# Patient Record
Sex: Female | Born: 1935 | Race: White | Hispanic: No | Smoking: Never smoker
Health system: Southern US, Community
[De-identification: ages and names within clinical notes are randomized; demographics above are authoritative.]

## PROBLEM LIST (undated history)

## (undated) DIAGNOSIS — E785 Hyperlipidemia, unspecified: Secondary | ICD-10-CM

## (undated) DIAGNOSIS — M4326 Fusion of spine, lumbar region: Secondary | ICD-10-CM

## (undated) DIAGNOSIS — K5730 Diverticulosis of large intestine without perforation or abscess without bleeding: Secondary | ICD-10-CM

## (undated) DIAGNOSIS — R079 Chest pain, unspecified: Secondary | ICD-10-CM

## (undated) DIAGNOSIS — R200 Anesthesia of skin: Secondary | ICD-10-CM

## (undated) DIAGNOSIS — F32A Depression, unspecified: Secondary | ICD-10-CM

## (undated) DIAGNOSIS — G8929 Other chronic pain: Secondary | ICD-10-CM

## (undated) DIAGNOSIS — F329 Major depressive disorder, single episode, unspecified: Secondary | ICD-10-CM

## (undated) DIAGNOSIS — M549 Dorsalgia, unspecified: Secondary | ICD-10-CM

## (undated) DIAGNOSIS — H269 Unspecified cataract: Secondary | ICD-10-CM

## (undated) DIAGNOSIS — K5900 Constipation, unspecified: Secondary | ICD-10-CM

## (undated) DIAGNOSIS — K219 Gastro-esophageal reflux disease without esophagitis: Secondary | ICD-10-CM

## (undated) DIAGNOSIS — M419 Scoliosis, unspecified: Secondary | ICD-10-CM

## (undated) DIAGNOSIS — Z8719 Personal history of other diseases of the digestive system: Secondary | ICD-10-CM

## (undated) DIAGNOSIS — M4716 Other spondylosis with myelopathy, lumbar region: Secondary | ICD-10-CM

## (undated) DIAGNOSIS — R42 Dizziness and giddiness: Secondary | ICD-10-CM

## (undated) DIAGNOSIS — M1990 Unspecified osteoarthritis, unspecified site: Secondary | ICD-10-CM

## (undated) DIAGNOSIS — M542 Cervicalgia: Secondary | ICD-10-CM

## (undated) DIAGNOSIS — M8580 Other specified disorders of bone density and structure, unspecified site: Secondary | ICD-10-CM

## (undated) DIAGNOSIS — I10 Essential (primary) hypertension: Secondary | ICD-10-CM

## (undated) DIAGNOSIS — C4491 Basal cell carcinoma of skin, unspecified: Secondary | ICD-10-CM

## (undated) HISTORY — PX: COLON SURGERY: SHX602

## (undated) HISTORY — PX: TUBAL LIGATION: SHX77

## (undated) HISTORY — PX: EYE SURGERY: SHX253

## (undated) HISTORY — PX: SPINE SURGERY: SHX786

## (undated) HISTORY — DX: Diverticulosis of large intestine without perforation or abscess without bleeding: K57.30

## (undated) HISTORY — DX: Chest pain, unspecified: R07.9

## (undated) HISTORY — DX: Cervicalgia: M54.2

## (undated) HISTORY — DX: Unspecified cataract: H26.9

## (undated) HISTORY — DX: Personal history of other diseases of the digestive system: Z87.19

## (undated) HISTORY — DX: Other specified disorders of bone density and structure, unspecified site: M85.80

## (undated) HISTORY — DX: Fusion of spine, lumbar region: M43.26

## (undated) HISTORY — DX: Unspecified osteoarthritis, unspecified site: M19.90

## (undated) HISTORY — DX: Gastro-esophageal reflux disease without esophagitis: K21.9

## (undated) HISTORY — DX: Anesthesia of skin: R20.0

## (undated) HISTORY — DX: Essential (primary) hypertension: I10

## (undated) HISTORY — PX: APPENDECTOMY: SHX54

## (undated) HISTORY — PX: NECK SURGERY: SHX720

## (undated) HISTORY — DX: Hyperlipidemia, unspecified: E78.5

## (undated) HISTORY — PX: CHOLECYSTECTOMY: SHX55

## (undated) HISTORY — DX: Basal cell carcinoma of skin, unspecified: C44.91

## (undated) HISTORY — PX: BACK SURGERY: SHX140

## (undated) HISTORY — DX: Other spondylosis with myelopathy, lumbar region: M47.16

## (undated) HISTORY — PX: COLONOSCOPY: SHX174

## (undated) HISTORY — PX: KNEE SURGERY: SHX244

## (undated) HISTORY — DX: Scoliosis, unspecified: M41.9

---

## 1968-09-28 HISTORY — PX: CHOLECYSTECTOMY: SHX55

## 1998-11-27 ENCOUNTER — Encounter: Payer: Self-pay | Admitting: Family Medicine

## 1998-11-27 LAB — CONVERTED CEMR LAB: Pap Smear: NORMAL

## 1998-12-06 ENCOUNTER — Other Ambulatory Visit: Admission: RE | Admit: 1998-12-06 | Discharge: 1998-12-06 | Payer: Self-pay | Admitting: Family Medicine

## 1999-05-30 LAB — HM COLONOSCOPY

## 1999-07-25 ENCOUNTER — Inpatient Hospital Stay (HOSPITAL_COMMUNITY): Admission: AD | Admit: 1999-07-25 | Discharge: 1999-08-01 | Payer: Self-pay | Admitting: Family Medicine

## 1999-07-25 ENCOUNTER — Encounter: Payer: Self-pay | Admitting: Gastroenterology

## 1999-08-20 ENCOUNTER — Encounter: Payer: Self-pay | Admitting: Gastroenterology

## 1999-08-20 ENCOUNTER — Ambulatory Visit (HOSPITAL_COMMUNITY): Admission: RE | Admit: 1999-08-20 | Discharge: 1999-08-20 | Payer: Self-pay | Admitting: Gastroenterology

## 1999-09-27 ENCOUNTER — Emergency Department (HOSPITAL_COMMUNITY): Admission: EM | Admit: 1999-09-27 | Discharge: 1999-09-27 | Payer: Self-pay | Admitting: Emergency Medicine

## 1999-09-27 ENCOUNTER — Encounter: Payer: Self-pay | Admitting: Emergency Medicine

## 1999-09-29 HISTORY — PX: PARTIAL COLECTOMY: SHX5273

## 1999-10-06 ENCOUNTER — Inpatient Hospital Stay (HOSPITAL_COMMUNITY): Admission: RE | Admit: 1999-10-06 | Discharge: 1999-10-13 | Payer: Self-pay | Admitting: *Deleted

## 2000-02-15 ENCOUNTER — Encounter: Payer: Self-pay | Admitting: Emergency Medicine

## 2000-02-16 ENCOUNTER — Inpatient Hospital Stay (HOSPITAL_COMMUNITY): Admission: EM | Admit: 2000-02-16 | Discharge: 2000-02-21 | Payer: Self-pay | Admitting: Internal Medicine

## 2001-09-06 ENCOUNTER — Encounter: Payer: Self-pay | Admitting: Gastroenterology

## 2001-09-06 ENCOUNTER — Encounter: Admission: RE | Admit: 2001-09-06 | Discharge: 2001-09-06 | Payer: Self-pay | Admitting: Gastroenterology

## 2003-10-31 ENCOUNTER — Encounter: Admission: RE | Admit: 2003-10-31 | Discharge: 2003-10-31 | Payer: Self-pay | Admitting: Orthopedic Surgery

## 2003-11-22 ENCOUNTER — Inpatient Hospital Stay (HOSPITAL_COMMUNITY): Admission: RE | Admit: 2003-11-22 | Discharge: 2003-11-24 | Payer: Self-pay | Admitting: Orthopaedic Surgery

## 2004-07-11 ENCOUNTER — Emergency Department (HOSPITAL_COMMUNITY): Admission: AD | Admit: 2004-07-11 | Discharge: 2004-07-11 | Payer: Self-pay | Admitting: Family Medicine

## 2004-07-11 ENCOUNTER — Ambulatory Visit (HOSPITAL_COMMUNITY): Admission: RE | Admit: 2004-07-11 | Discharge: 2004-07-11 | Payer: Self-pay | Admitting: Family Medicine

## 2004-08-11 ENCOUNTER — Ambulatory Visit: Payer: Self-pay | Admitting: Family Medicine

## 2004-08-12 ENCOUNTER — Ambulatory Visit: Payer: Self-pay | Admitting: Family Medicine

## 2004-08-12 ENCOUNTER — Encounter: Admission: RE | Admit: 2004-08-12 | Discharge: 2004-08-12 | Payer: Self-pay | Admitting: Family Medicine

## 2004-09-04 ENCOUNTER — Ambulatory Visit: Payer: Self-pay | Admitting: Family Medicine

## 2004-11-03 ENCOUNTER — Ambulatory Visit: Payer: Self-pay | Admitting: Family Medicine

## 2005-01-06 ENCOUNTER — Ambulatory Visit: Payer: Self-pay | Admitting: Family Medicine

## 2005-02-13 ENCOUNTER — Ambulatory Visit: Payer: Self-pay | Admitting: Family Medicine

## 2005-03-04 ENCOUNTER — Ambulatory Visit: Payer: Self-pay | Admitting: Family Medicine

## 2005-03-30 ENCOUNTER — Ambulatory Visit: Payer: Self-pay | Admitting: Cardiology

## 2005-03-30 ENCOUNTER — Ambulatory Visit: Payer: Self-pay | Admitting: Family Medicine

## 2005-06-24 ENCOUNTER — Ambulatory Visit: Payer: Self-pay | Admitting: Family Medicine

## 2005-07-24 ENCOUNTER — Ambulatory Visit: Payer: Self-pay | Admitting: Family Medicine

## 2005-09-09 ENCOUNTER — Ambulatory Visit: Payer: Self-pay | Admitting: Family Medicine

## 2005-12-14 ENCOUNTER — Ambulatory Visit: Payer: Self-pay | Admitting: Family Medicine

## 2005-12-16 ENCOUNTER — Ambulatory Visit: Payer: Self-pay | Admitting: Family Medicine

## 2005-12-30 ENCOUNTER — Ambulatory Visit: Payer: Self-pay | Admitting: Family Medicine

## 2006-02-05 ENCOUNTER — Ambulatory Visit: Payer: Self-pay | Admitting: Family Medicine

## 2006-03-18 ENCOUNTER — Ambulatory Visit: Payer: Self-pay | Admitting: Family Medicine

## 2006-04-28 HISTORY — PX: CATARACT EXTRACTION: SUR2

## 2006-05-25 ENCOUNTER — Ambulatory Visit: Payer: Self-pay | Admitting: Family Medicine

## 2006-06-24 ENCOUNTER — Ambulatory Visit: Payer: Self-pay | Admitting: Family Medicine

## 2006-07-14 ENCOUNTER — Ambulatory Visit: Payer: Self-pay | Admitting: Family Medicine

## 2006-08-13 ENCOUNTER — Ambulatory Visit: Payer: Self-pay | Admitting: Family Medicine

## 2006-08-30 ENCOUNTER — Ambulatory Visit: Payer: Self-pay | Admitting: Family Medicine

## 2006-09-14 ENCOUNTER — Ambulatory Visit: Payer: Self-pay | Admitting: Family Medicine

## 2006-11-16 ENCOUNTER — Ambulatory Visit: Payer: Self-pay | Admitting: Family Medicine

## 2006-11-16 LAB — CONVERTED CEMR LAB
ALT: 21 units/L (ref 0–40)
AST: 24 units/L (ref 0–37)
Albumin: 3.8 g/dL (ref 3.5–5.2)
BUN: 19 mg/dL (ref 6–23)
CO2: 31 meq/L (ref 19–32)
Chloride: 100 meq/L (ref 96–112)
Creatinine, Ser: 1.3 mg/dL — ABNORMAL HIGH (ref 0.4–1.2)
Direct LDL: 163 mg/dL
GFR calc non Af Amer: 42 mL/min
Phosphorus: 2.9 mg/dL (ref 2.3–4.6)
Triglycerides: 141 mg/dL (ref 0–149)

## 2007-02-04 ENCOUNTER — Encounter: Payer: Self-pay | Admitting: Family Medicine

## 2007-02-04 DIAGNOSIS — K5730 Diverticulosis of large intestine without perforation or abscess without bleeding: Secondary | ICD-10-CM | POA: Insufficient documentation

## 2007-02-04 DIAGNOSIS — IMO0002 Reserved for concepts with insufficient information to code with codable children: Secondary | ICD-10-CM | POA: Insufficient documentation

## 2007-02-04 DIAGNOSIS — R252 Cramp and spasm: Secondary | ICD-10-CM | POA: Insufficient documentation

## 2007-02-04 DIAGNOSIS — E785 Hyperlipidemia, unspecified: Secondary | ICD-10-CM | POA: Insufficient documentation

## 2007-02-04 DIAGNOSIS — I10 Essential (primary) hypertension: Secondary | ICD-10-CM

## 2007-02-04 DIAGNOSIS — M109 Gout, unspecified: Secondary | ICD-10-CM

## 2007-02-04 DIAGNOSIS — M1990 Unspecified osteoarthritis, unspecified site: Secondary | ICD-10-CM | POA: Insufficient documentation

## 2007-02-04 DIAGNOSIS — N3080 Other cystitis without hematuria: Secondary | ICD-10-CM

## 2007-02-04 DIAGNOSIS — N289 Disorder of kidney and ureter, unspecified: Secondary | ICD-10-CM

## 2007-02-04 DIAGNOSIS — Z8719 Personal history of other diseases of the digestive system: Secondary | ICD-10-CM

## 2007-02-04 DIAGNOSIS — N1832 Chronic kidney disease, stage 3b: Secondary | ICD-10-CM | POA: Insufficient documentation

## 2007-02-04 DIAGNOSIS — N3000 Acute cystitis without hematuria: Secondary | ICD-10-CM | POA: Insufficient documentation

## 2007-02-14 ENCOUNTER — Ambulatory Visit: Payer: Self-pay | Admitting: Family Medicine

## 2007-02-14 DIAGNOSIS — F418 Other specified anxiety disorders: Secondary | ICD-10-CM | POA: Insufficient documentation

## 2007-02-16 LAB — CONVERTED CEMR LAB
AST: 23 units/L (ref 0–37)
Cholesterol: 314 mg/dL (ref 0–200)
Creatinine, Ser: 1.3 mg/dL — ABNORMAL HIGH (ref 0.4–1.2)
Direct LDL: 211.4 mg/dL
Total CHOL/HDL Ratio: 6.2

## 2007-02-22 ENCOUNTER — Ambulatory Visit: Payer: Self-pay | Admitting: Family Medicine

## 2007-02-22 LAB — CONVERTED CEMR LAB
AST: 29 units/L (ref 0–37)
Albumin: 4 g/dL (ref 3.5–5.2)
Alkaline Phosphatase: 51 units/L (ref 39–117)
Basophils Absolute: 0 10*3/uL (ref 0.0–0.1)
Basophils Relative: 0.1 % (ref 0.0–1.0)
CO2: 30 meq/L (ref 19–32)
Chloride: 101 meq/L (ref 96–112)
Creatinine, Ser: 1.6 mg/dL — ABNORMAL HIGH (ref 0.4–1.2)
HCT: 46.3 % — ABNORMAL HIGH (ref 36.0–46.0)
Hemoglobin: 15.9 g/dL — ABNORMAL HIGH (ref 12.0–15.0)
Lipase: 17 units/L (ref 11.0–59.0)
MCHC: 34.3 g/dL (ref 30.0–36.0)
Monocytes Absolute: 0.9 10*3/uL — ABNORMAL HIGH (ref 0.2–0.7)
Neutrophils Relative %: 34.4 % — ABNORMAL LOW (ref 43.0–77.0)
Potassium: 3.5 meq/L (ref 3.5–5.1)
RBC: 5.34 M/uL — ABNORMAL HIGH (ref 3.87–5.11)
RDW: 13.2 % (ref 11.5–14.6)
Sodium: 141 meq/L (ref 135–145)
Total Bilirubin: 0.8 mg/dL (ref 0.3–1.2)
Total Protein: 7.3 g/dL (ref 6.0–8.3)
WBC: 10.7 10*3/uL — ABNORMAL HIGH (ref 4.5–10.5)

## 2007-02-23 ENCOUNTER — Ambulatory Visit: Payer: Self-pay | Admitting: Family Medicine

## 2007-02-24 LAB — CONVERTED CEMR LAB
Albumin: 4.1 g/dL (ref 3.5–5.2)
BUN: 19 mg/dL (ref 6–23)
Calcium: 9 mg/dL (ref 8.4–10.5)
Eosinophils Absolute: 0.1 10*3/uL (ref 0.0–0.6)
GFR calc Af Amer: 43 mL/min
GFR calc non Af Amer: 36 mL/min
Lymphocytes Relative: 53.7 % — ABNORMAL HIGH (ref 12.0–46.0)
MCV: 87.3 fL (ref 78.0–100.0)
Monocytes Relative: 8.2 % (ref 3.0–11.0)
Neutro Abs: 3.9 10*3/uL (ref 1.4–7.7)
Phosphorus: 2 mg/dL — ABNORMAL LOW (ref 2.3–4.6)
Platelets: 349 10*3/uL (ref 150–400)
Potassium: 3.7 meq/L (ref 3.5–5.1)
RBC: 5.43 M/uL — ABNORMAL HIGH (ref 3.87–5.11)

## 2007-02-25 ENCOUNTER — Ambulatory Visit: Payer: Self-pay | Admitting: Family Medicine

## 2007-03-02 ENCOUNTER — Ambulatory Visit: Payer: Self-pay | Admitting: Family Medicine

## 2007-03-02 LAB — CONVERTED CEMR LAB: BUN: 11 mg/dL (ref 6–23)

## 2007-03-11 ENCOUNTER — Telehealth (INDEPENDENT_AMBULATORY_CARE_PROVIDER_SITE_OTHER): Payer: Self-pay | Admitting: *Deleted

## 2007-03-16 ENCOUNTER — Ambulatory Visit: Payer: Self-pay | Admitting: Family Medicine

## 2007-03-18 LAB — CONVERTED CEMR LAB
ALT: 20 units/L (ref 0–40)
AST: 23 units/L (ref 0–37)
Albumin: 3.7 g/dL (ref 3.5–5.2)
BUN: 13 mg/dL (ref 6–23)
Calcium: 9.3 mg/dL (ref 8.4–10.5)
GFR calc Af Amer: 51 mL/min
GFR calc non Af Amer: 42 mL/min
Glucose, Bld: 93 mg/dL (ref 70–99)
Potassium: 3.5 meq/L (ref 3.5–5.1)
Total CHOL/HDL Ratio: 7.1
VLDL: 37 mg/dL (ref 0–40)

## 2007-07-08 ENCOUNTER — Ambulatory Visit: Payer: Self-pay | Admitting: Family Medicine

## 2007-07-12 ENCOUNTER — Ambulatory Visit: Payer: Self-pay | Admitting: Family Medicine

## 2007-07-18 LAB — CONVERTED CEMR LAB
Albumin: 3.6 g/dL (ref 3.5–5.2)
Chloride: 102 meq/L (ref 96–112)
Direct LDL: 215.9 mg/dL
GFR calc Af Amer: 56 mL/min
GFR calc non Af Amer: 46 mL/min
Phosphorus: 3.1 mg/dL (ref 2.3–4.6)
Sodium: 140 meq/L (ref 135–145)
Triglycerides: 149 mg/dL (ref 0–149)
VLDL: 30 mg/dL (ref 0–40)

## 2007-08-01 ENCOUNTER — Ambulatory Visit: Payer: Self-pay | Admitting: Family Medicine

## 2007-08-01 LAB — CONVERTED CEMR LAB
Glucose, Urine, Semiquant: NEGATIVE
Ketones, urine, test strip: NEGATIVE
Protein, U semiquant: NEGATIVE

## 2007-08-30 ENCOUNTER — Ambulatory Visit: Payer: Self-pay | Admitting: Family Medicine

## 2007-09-05 LAB — CONVERTED CEMR LAB
Cholesterol: 248 mg/dL (ref 0–200)
HDL: 56.5 mg/dL (ref >39.0)
Total CHOL/HDL Ratio: 4.4
Triglycerides: 162 mg/dL — ABNORMAL HIGH (ref 0–149)
VLDL: 32 mg/dL (ref 0–40)

## 2007-09-13 ENCOUNTER — Ambulatory Visit: Payer: Self-pay | Admitting: Family Medicine

## 2007-09-13 LAB — CONVERTED CEMR LAB
Nitrite: NEGATIVE
Protein, U semiquant: NEGATIVE

## 2007-09-14 ENCOUNTER — Encounter: Payer: Self-pay | Admitting: Family Medicine

## 2007-10-21 ENCOUNTER — Ambulatory Visit: Payer: Self-pay | Admitting: Family Medicine

## 2007-10-26 ENCOUNTER — Encounter: Payer: Self-pay | Admitting: Family Medicine

## 2007-10-31 ENCOUNTER — Encounter: Payer: Self-pay | Admitting: Family Medicine

## 2007-10-31 ENCOUNTER — Encounter (INDEPENDENT_AMBULATORY_CARE_PROVIDER_SITE_OTHER): Payer: Self-pay | Admitting: *Deleted

## 2007-11-01 ENCOUNTER — Ambulatory Visit: Payer: Self-pay | Admitting: Family Medicine

## 2007-11-01 DIAGNOSIS — K5900 Constipation, unspecified: Secondary | ICD-10-CM | POA: Insufficient documentation

## 2007-11-01 LAB — CONVERTED CEMR LAB
Ketones, urine, test strip: NEGATIVE
Nitrite: NEGATIVE
Urobilinogen, UA: 0.2

## 2007-11-02 ENCOUNTER — Encounter: Payer: Self-pay | Admitting: Family Medicine

## 2007-11-17 ENCOUNTER — Telehealth: Payer: Self-pay | Admitting: Family Medicine

## 2008-01-27 ENCOUNTER — Ambulatory Visit: Payer: Self-pay | Admitting: Internal Medicine

## 2008-01-27 LAB — CONVERTED CEMR LAB
Glucose, Urine, Semiquant: NEGATIVE
Ketones, urine, test strip: NEGATIVE
Nitrite: NEGATIVE
pH: 8.5

## 2008-07-09 ENCOUNTER — Ambulatory Visit: Payer: Self-pay | Admitting: Family Medicine

## 2008-07-16 ENCOUNTER — Ambulatory Visit: Payer: Self-pay | Admitting: Family Medicine

## 2008-07-17 LAB — CONVERTED CEMR LAB
Cholesterol: 297 mg/dL (ref 0–200)
Direct LDL: 185.8 mg/dL
Total CHOL/HDL Ratio: 6

## 2008-07-26 ENCOUNTER — Telehealth: Payer: Self-pay | Admitting: Family Medicine

## 2008-08-08 ENCOUNTER — Ambulatory Visit: Payer: Self-pay | Admitting: Family Medicine

## 2008-08-08 LAB — CONVERTED CEMR LAB
Bilirubin Urine: NEGATIVE
Glucose, Urine, Semiquant: NEGATIVE
Ketones, urine, test strip: NEGATIVE
Specific Gravity, Urine: <1.005
Urobilinogen, UA: 0.2
pH: 7

## 2008-08-13 ENCOUNTER — Telehealth: Payer: Self-pay | Admitting: Family Medicine

## 2008-08-13 ENCOUNTER — Ambulatory Visit: Payer: Self-pay | Admitting: Family Medicine

## 2008-08-13 LAB — CONVERTED CEMR LAB
Nitrite: NEGATIVE
Specific Gravity, Urine: <1.005
Urobilinogen, UA: 0.2
WBC Urine, dipstick: NEGATIVE

## 2008-08-14 ENCOUNTER — Encounter: Payer: Self-pay | Admitting: Family Medicine

## 2008-08-22 ENCOUNTER — Encounter: Payer: Self-pay | Admitting: Family Medicine

## 2008-08-30 ENCOUNTER — Ambulatory Visit: Payer: Self-pay | Admitting: Family Medicine

## 2008-09-05 LAB — CONVERTED CEMR LAB
ALT: 16 units/L (ref 0–35)
Cholesterol: 210 mg/dL (ref 0–200)
Direct LDL: 143.2 mg/dL

## 2008-09-25 ENCOUNTER — Telehealth: Payer: Self-pay | Admitting: Family Medicine

## 2008-11-12 ENCOUNTER — Telehealth (INDEPENDENT_AMBULATORY_CARE_PROVIDER_SITE_OTHER): Payer: Self-pay | Admitting: *Deleted

## 2008-12-17 ENCOUNTER — Encounter: Payer: Self-pay | Admitting: Family Medicine

## 2008-12-24 ENCOUNTER — Encounter (INDEPENDENT_AMBULATORY_CARE_PROVIDER_SITE_OTHER): Payer: Self-pay | Admitting: *Deleted

## 2009-01-28 ENCOUNTER — Ambulatory Visit: Payer: Self-pay | Admitting: Family Medicine

## 2009-01-29 LAB — CONVERTED CEMR LAB
AST: 25 units/L (ref 0–37)
Albumin: 3.9 g/dL (ref 3.5–5.2)
Alkaline Phosphatase: 58 units/L (ref 39–117)
BUN: 24 mg/dL — ABNORMAL HIGH (ref 6–23)
Basophils Relative: 0.2 % (ref 0.0–3.0)
Calcium: 9.5 mg/dL (ref 8.4–10.5)
Cholesterol: 234 mg/dL — ABNORMAL HIGH (ref 0–200)
Direct LDL: 157.8 mg/dL
Eosinophils Relative: 0.8 % (ref 0.0–5.0)
Glucose, Bld: 102 mg/dL — ABNORMAL HIGH (ref 70–99)
HCT: 42.2 % (ref 36.0–46.0)
HDL: 53.4 mg/dL (ref >39.00)
MCV: 90.7 fL (ref 78.0–100.0)
Monocytes Absolute: 0.7 10*3/uL (ref 0.1–1.0)
Monocytes Relative: 7.3 % (ref 3.0–12.0)
Neutrophils Relative %: 33.6 % — ABNORMAL LOW (ref 43.0–77.0)
Phosphorus: 3.6 mg/dL (ref 2.3–4.6)
Platelets: 263 10*3/uL (ref 150.0–400.0)
RBC: 4.65 M/uL (ref 3.87–5.11)
TSH: 2.23 microintl units/mL (ref 0.35–5.50)
Total Protein: 7.1 g/dL (ref 6.0–8.3)
VLDL: 28 mg/dL (ref 0.0–40.0)
WBC: 9.9 10*3/uL (ref 4.5–10.5)

## 2009-01-30 ENCOUNTER — Ambulatory Visit: Payer: Self-pay | Admitting: Family Medicine

## 2009-01-30 DIAGNOSIS — M949 Disorder of cartilage, unspecified: Secondary | ICD-10-CM

## 2009-01-30 DIAGNOSIS — M899 Disorder of bone, unspecified: Secondary | ICD-10-CM

## 2009-02-19 ENCOUNTER — Encounter: Payer: Self-pay | Admitting: Family Medicine

## 2009-02-27 ENCOUNTER — Encounter (INDEPENDENT_AMBULATORY_CARE_PROVIDER_SITE_OTHER): Payer: Self-pay | Admitting: *Deleted

## 2009-03-09 ENCOUNTER — Emergency Department (HOSPITAL_COMMUNITY): Admission: EM | Admit: 2009-03-09 | Discharge: 2009-03-09 | Payer: Self-pay | Admitting: Emergency Medicine

## 2009-03-14 ENCOUNTER — Encounter: Payer: Self-pay | Admitting: Family Medicine

## 2009-03-14 ENCOUNTER — Ambulatory Visit: Payer: Self-pay | Admitting: Family Medicine

## 2009-03-15 LAB — CONVERTED CEMR LAB
Albumin: 4.2 g/dL (ref 3.5–5.2)
BUN: 26 mg/dL — ABNORMAL HIGH (ref 6–23)
CO2: 31 meq/L (ref 19–32)
Calcium: 9.4 mg/dL (ref 8.4–10.5)
Chloride: 99 meq/L (ref 96–112)
Creatinine, Ser: 1.3 mg/dL — ABNORMAL HIGH (ref 0.4–1.2)
Direct LDL: 135.8 mg/dL
HDL: 52.8 mg/dL (ref >39.00)
Triglycerides: 146 mg/dL (ref 0.0–149.0)
VLDL: 29.2 mg/dL (ref 0.0–40.0)

## 2009-03-18 ENCOUNTER — Telehealth (INDEPENDENT_AMBULATORY_CARE_PROVIDER_SITE_OTHER): Payer: Self-pay | Admitting: *Deleted

## 2009-03-19 ENCOUNTER — Encounter: Payer: Self-pay | Admitting: Family Medicine

## 2009-03-19 ENCOUNTER — Ambulatory Visit: Payer: Self-pay

## 2009-03-26 ENCOUNTER — Ambulatory Visit: Payer: Self-pay | Admitting: Family Medicine

## 2009-04-24 ENCOUNTER — Ambulatory Visit: Payer: Self-pay | Admitting: Family Medicine

## 2009-04-24 LAB — CONVERTED CEMR LAB: Yeast, UA: 0

## 2009-04-25 ENCOUNTER — Encounter: Payer: Self-pay | Admitting: Family Medicine

## 2009-06-01 ENCOUNTER — Emergency Department (HOSPITAL_COMMUNITY): Admission: EM | Admit: 2009-06-01 | Discharge: 2009-06-01 | Payer: Self-pay | Admitting: Emergency Medicine

## 2009-06-03 ENCOUNTER — Telehealth: Payer: Self-pay | Admitting: Family Medicine

## 2009-06-04 ENCOUNTER — Ambulatory Visit: Payer: Self-pay | Admitting: Family Medicine

## 2009-06-04 LAB — CONVERTED CEMR LAB
Casts: 0 /lpf
Ketones, urine, test strip: NEGATIVE
Protein, U semiquant: NEGATIVE
Urobilinogen, UA: 0.2
Yeast, UA: 0

## 2009-06-06 ENCOUNTER — Encounter: Payer: Self-pay | Admitting: Family Medicine

## 2009-06-19 ENCOUNTER — Ambulatory Visit: Payer: Self-pay | Admitting: Family Medicine

## 2009-06-19 LAB — CONVERTED CEMR LAB
Casts: 0 /lpf
Glucose, Urine, Semiquant: NEGATIVE
KOH Prep: NEGATIVE
Specific Gravity, Urine: <1.005
Urine crystals, microscopic: 0 /hpf
Whiff Test: NEGATIVE
Yeast, UA: 0
pH: 6

## 2009-06-20 ENCOUNTER — Encounter: Payer: Self-pay | Admitting: Family Medicine

## 2009-06-21 LAB — CONVERTED CEMR LAB
Albumin: 3.8 g/dL (ref 3.5–5.2)
BUN: 26 mg/dL — ABNORMAL HIGH (ref 6–23)
CO2: 32 meq/L (ref 19–32)
Calcium: 9.7 mg/dL (ref 8.4–10.5)
Chloride: 104 meq/L (ref 96–112)
Cholesterol: 217 mg/dL — ABNORMAL HIGH (ref 0–200)
Direct LDL: 145.7 mg/dL
Potassium: 4.2 meq/L (ref 3.5–5.1)
VLDL: 26.2 mg/dL (ref 0.0–40.0)

## 2009-08-30 ENCOUNTER — Ambulatory Visit: Payer: Self-pay | Admitting: Family Medicine

## 2009-08-30 LAB — CONVERTED CEMR LAB
Casts: 0 /lpf
Glucose, Urine, Semiquant: NEGATIVE
Ketones, urine, test strip: NEGATIVE
Urobilinogen, UA: 0.2
Yeast, UA: 0

## 2009-08-31 ENCOUNTER — Encounter: Payer: Self-pay | Admitting: Family Medicine

## 2009-09-02 LAB — CONVERTED CEMR LAB
AST: 24 units/L (ref 0–37)
Albumin: 4.1 g/dL (ref 3.5–5.2)
BUN: 18 mg/dL (ref 6–23)
CO2: 32 meq/L (ref 19–32)
Chloride: 101 meq/L (ref 96–112)
GFR calc non Af Amer: 46.12 mL/min (ref >60)
HDL: 56.4 mg/dL (ref >39.00)
Phosphorus: 3.1 mg/dL (ref 2.3–4.6)
Potassium: 4.2 meq/L (ref 3.5–5.1)
Triglycerides: 113 mg/dL (ref 0.0–149.0)
VLDL: 22.6 mg/dL (ref 0.0–40.0)

## 2009-09-18 ENCOUNTER — Ambulatory Visit: Payer: Self-pay | Admitting: Family Medicine

## 2009-09-18 LAB — CONVERTED CEMR LAB
Bacteria, UA: 0
Glucose, Urine, Semiquant: NEGATIVE
Ketones, urine, test strip: NEGATIVE
Mucus, UA: 0
Nitrite: NEGATIVE
Protein, U semiquant: NEGATIVE
RBC / HPF: 0
Urine crystals, microscopic: 0 /hpf

## 2010-03-18 ENCOUNTER — Telehealth: Payer: Self-pay | Admitting: Family Medicine

## 2010-03-21 ENCOUNTER — Encounter: Payer: Self-pay | Admitting: Family Medicine

## 2010-03-21 LAB — HM MAMMOGRAPHY: HM Mammogram: NORMAL

## 2010-03-28 ENCOUNTER — Telehealth: Payer: Self-pay | Admitting: Family Medicine

## 2010-03-28 ENCOUNTER — Encounter: Payer: Self-pay | Admitting: Family Medicine

## 2010-04-04 ENCOUNTER — Other Ambulatory Visit: Admission: RE | Admit: 2010-04-04 | Discharge: 2010-04-04 | Payer: Self-pay | Admitting: Family Medicine

## 2010-04-04 ENCOUNTER — Ambulatory Visit: Payer: Self-pay | Admitting: Family Medicine

## 2010-04-04 LAB — CONVERTED CEMR LAB
Bacteria, UA: 0
Bilirubin Urine: NEGATIVE
Epithelial cells, urine: 1 /lpf
Glucose, Urine, Semiquant: NEGATIVE
KOH Prep: NEGATIVE
Ketones, urine, test strip: NEGATIVE
Pap Smear: NORMAL
RBC / HPF: 0
Urobilinogen, UA: 0.2

## 2010-04-09 ENCOUNTER — Encounter: Payer: Self-pay | Admitting: Family Medicine

## 2010-04-14 ENCOUNTER — Encounter: Payer: Self-pay | Admitting: Family Medicine

## 2010-04-14 LAB — CONVERTED CEMR LAB: Pap Smear: NEGATIVE

## 2010-04-24 ENCOUNTER — Ambulatory Visit: Payer: Self-pay | Admitting: Family Medicine

## 2010-05-19 ENCOUNTER — Ambulatory Visit: Payer: Self-pay | Admitting: Family Medicine

## 2010-05-19 DIAGNOSIS — R001 Bradycardia, unspecified: Secondary | ICD-10-CM | POA: Insufficient documentation

## 2010-06-12 ENCOUNTER — Telehealth: Payer: Self-pay | Admitting: Family Medicine

## 2010-06-13 ENCOUNTER — Ambulatory Visit: Payer: Self-pay | Admitting: Family Medicine

## 2010-07-08 ENCOUNTER — Ambulatory Visit: Payer: Self-pay | Admitting: Family Medicine

## 2010-07-23 ENCOUNTER — Ambulatory Visit: Payer: Self-pay | Admitting: Family Medicine

## 2010-07-29 LAB — CONVERTED CEMR LAB
Albumin: 4 g/dL (ref 3.5–5.2)
BUN: 24 mg/dL — ABNORMAL HIGH (ref 6–23)
Basophils Relative: 0.4 % (ref 0.0–3.0)
Calcium: 9.6 mg/dL (ref 8.4–10.5)
Chloride: 97 meq/L (ref 96–112)
Cholesterol: 236 mg/dL — ABNORMAL HIGH (ref 0–200)
Direct LDL: 136.6 mg/dL
Eosinophils Absolute: 0.2 10*3/uL (ref 0.0–0.7)
Glucose, Bld: 87 mg/dL (ref 70–99)
HDL: 56.2 mg/dL (ref >39.00)
Hemoglobin: 14.2 g/dL (ref 12.0–15.0)
MCHC: 33.9 g/dL (ref 30.0–36.0)
MCV: 90.1 fL (ref 78.0–100.0)
Monocytes Absolute: 0.7 10*3/uL (ref 0.1–1.0)
Neutro Abs: 4.1 10*3/uL (ref 1.4–7.7)
Potassium: 4.1 meq/L (ref 3.5–5.1)
RBC: 4.66 M/uL (ref 3.87–5.11)
TSH: 1.45 microintl units/mL (ref 0.35–5.50)
Total Protein: 6.9 g/dL (ref 6.0–8.3)
Vit D, 25-Hydroxy: 59 ng/mL (ref 30–89)

## 2010-08-27 ENCOUNTER — Ambulatory Visit: Payer: Self-pay | Admitting: Family Medicine

## 2010-08-27 DIAGNOSIS — N390 Urinary tract infection, site not specified: Secondary | ICD-10-CM

## 2010-08-27 LAB — CONVERTED CEMR LAB
Glucose, Urine, Semiquant: NEGATIVE
Specific Gravity, Urine: 1.02
pH: 6

## 2010-08-28 ENCOUNTER — Encounter: Payer: Self-pay | Admitting: Family Medicine

## 2010-08-29 ENCOUNTER — Telehealth: Payer: Self-pay | Admitting: Family Medicine

## 2010-10-18 ENCOUNTER — Encounter: Payer: Self-pay | Admitting: Orthopedic Surgery

## 2010-10-28 NOTE — Progress Notes (Signed)
Summary: still has UTI sxs  Phone Note Call from Patient Call back at Home Phone [phone]   Caller: Patient Summary of Call: Pt was given cipro for a UTI.  She will finish this tomorrow but has a refill on it and plans to get that refill becuse she is still having sxs.  She is asking if and when you want her to drop off a sample for culture. Initial call taken by: Lowella Petties CMA, AAMA,  August 29, 2010 10:40 AM  Follow-up for Phone Call        don't drop off another sample yet.  I'm awaiting the initial cx results.  have her get the refill on the antibiotics and use it in the meantime.  I'll send word on the culture when resulted.  Follow-up by: Crawford Givens MD,  August 29, 2010 10:54 AM  Additional Follow-up for Phone Call Additional follow up Details #1::        Patient Advised.  Additional Follow-up by: Delilah Shan CMA Duncan Dull),  August 29, 2010 12:58 PM

## 2010-10-28 NOTE — Progress Notes (Signed)
Summary: refill request for metrogel cream  Phone Note Refill Request Message from:  Fax from Pharmacy  Refills Requested: Medication #1:  METROGEL-VAGINAL 0.75 % GEL 1 applicatorful intravaginally at bedtime for 7 days.   Last Refilled: 09/18/2009 Faxed request from [facility].  Initial call taken by: Lowella Petties CMA,  March 18, 2010 11:24 AM  Follow-up for Phone Call        This is not something I refill without the pt being seen. Should see Dr Milinda Antis. Shaune Leeks MD  March 18, 2010 1:24 PM  Follow-up by: Shaune Leeks MD,  March 18, 2010 1:24 PM  Additional Follow-up for Phone Call Additional follow up Details #1::        Advised pharmacist.         Additional Follow-up by: Lowella Petties CMA,  March 18, 2010 2:01 PM

## 2010-10-28 NOTE — Miscellaneous (Signed)
Summary: Cipro 250mg  rx and med list updat  Medications Added CIPRO 250 MG TABS (CIPROFLOXACIN HCL) Take one tablet by mouth twice a day for 7 days       Clinical Lists Changes  Medications: Added new medication of CIPRO 250 MG TABS (CIPROFLOXACIN HCL) Take one tablet by mouth twice a day for 7 days - Signed Rx of CIPRO 250 MG TABS (CIPROFLOXACIN HCL) Take one tablet by mouth twice a day for 7 days;  #14 x 0;  Signed;  Entered by: Lewanda Rife LPN;  Authorized by: [name] MD;  Method used: Electronically to [facility]*, [address], [place], [place]  [address], Ph: [phone], Fax: [phone]    Prescriptions: CIPRO 250 MG TABS (CIPROFLOXACIN HCL) Take one tablet by mouth twice a day for 7 days  #14 x 0   Entered by:   Lewanda Rife LPN   Authorized by:   [name] MD   Signed by:   Lewanda Rife LPN on 13/04/6577   Method used:   Electronically to        [facility]* (retail)       [address]       [place], [place]  [address]       Ph: [phone]       Fax: [phone]   RxID:   [id number]  Pt scheduled nurse visit for urine for culture on 04/25/10 at 9:30am.[facility] LPN  April 09, 2010 11:01 AM  Current Allergies: CEPHALOSPORINS * CLINDOMYCIN * EVISTA * TOPROL * CRESTOR ZOCOR MEVACOR ACE INHIBITORS [facility]

## 2010-10-28 NOTE — Miscellaneous (Signed)
Summary: pap screening   Clinical Lists Changes  Observations: Added new observation of PAP SMEAR: normal (04/04/2010 15:46)      Preventive Care Screening  Pap Smear:    Date:  04/04/2010    Results:  normal

## 2010-10-28 NOTE — Assessment & Plan Note (Signed)
Summary: BP ELEVATED AND PT FEELS NERVOUS/RI   Vital Signs:  Patient profile:   77 year old female Height:      62 inches Weight:      187.50 pounds BMI:     34.42 Temp:     97.4 degrees F oral Pulse rate:   60 / minute Pulse rhythm:   regular BP sitting:   156 / 66  (left arm) Cuff size:   large  Vitals Entered By: Lewanda Rife LPN (June 13, 2010 10:19 AM)  Serial Vital Signs/Assessments:  Time      Position  BP       Pulse  Resp  Temp     By                     142/65                         [name] MD  CC: On 06/10/10 pt was on vacation in [place] and pt had chest pain, EMT checked pt and EKG was normal. Pt was told to increase Torpol XL back to one twice a day. On 06/12/10 Pt drove back from [place] and BP was 171.67 p 52. Pt felt nervous and jittery.   History of Present Illness: here for f/u of HTN and palpitations and anx  at last visit we dec her toprol due to low pulse of 40-50  she went on vacation- on [place] and got chest pain  was sitting around house and had a lot of gas and burping and did not want to eat got pain right in center of chest  got palpitations and high bp-- was very scared  ems checked ekg- nl and adv inc her toprol back to two times a day   checking bp at home -- bp at night is 120s-130s over 50-60s  is feeling a little weak and shaky from this scare   buspar for anx-- has been taking it two times a day and she thinks it is helping    6/10 nl nuc stress study   Allergies: 1)  Cephalosporins 2)  * Clindomycin 3)  * Evista 4)  * Toprol 5)  * Crestor 6)  Zocor 7)  Mevacor 8)  Ace Inhibitors 9)  Welchol  Past History:  Past Surgical History: Last updated: 03/20/2009 Appendectomy Cataract extraction (04/2006) Cholecystectomy Colectomy- partial (09/1999) Back surgery Neck surgery Stress cardiolite (1997) Colonoscopy- diverticulosis (09/1996,  05/1999) Dexa- osteopenia (2000),  improved (01/2001) CT abd- admission for  diverticulosis (07/1999) Hosp. - facial cellulitis (02/2000) Colonoscopy- hemorrhoids (09/2001) Surgery knee, med meniscal tear dexa osteopenia (5/10)- hip -1.0 T score  6/10 nuclear stress study- normal  Family History: Last updated: 02/04/2007 Father: throat, bladder cancer. DM, HTN. died from MI during surgery Mother: died from CVA. Alzheimer's, HTN Siblings: 3 brothers, 2 sisters  Social History: Last updated: 02/04/2007 Marital Status: Married Children: 3 sons Occupation: retired  Risk Factors: Smoking Status: never (02/04/2007)  Past Medical History: Diverticulitis, hx of Diverticulosis, colon Gout Hyperlipidemia Hypertension Osteoarthritis squamous cell lesion removed  basal cell lesion removed  osteopenia  GERD/ atypical chest pain derm   Review of Systems General:  Complains of fatigue; denies loss of appetite and malaise. Eyes:  Denies blurring and eye pain. CV:  Complains of palpitations; denies chest pain or discomfort and lightheadness. Resp:  Denies cough, shortness of breath, and wheezing. GI:  Denies abdominal pain and nausea. GU:  Denies dysuria and urinary frequency. MS:  Denies cramps. Derm:  Denies itching, lesion(s), poor wound healing, and rash. Neuro:  Denies numbness and tingling. Psych:  Complains of anxiety and panic attacks; denies sense of great danger and suicidal thoughts/plans. Endo:  Denies cold intolerance, excessive thirst, excessive urination, and heat intolerance. Heme:  Denies abnormal bruising and bleeding.  Physical Exam  General:  overweight but generally well appearing  Head:  normocephalic, atraumatic, and no abnormalities observed.   Eyes:  vision grossly intact, pupils equal, pupils round, and pupils reactive to light.  no conjunctival pallor, injection or icterus  Mouth:  pharynx pink and moist.   Neck:  supple with full rom and no masses or thyromegally, no JVD or carotid bruit  Chest Wall:  No deformities, masses,  or tenderness noted. Lungs:  Normal respiratory effort, chest expands symmetrically. Lungs are clear to auscultation, no crackles or wheezes. Heart:  Normal rate and regular rhythm. S1 and S2 normal without gallop, murmur, click, rub or other extra sounds. Abdomen:  Bowel sounds positive,abdomen soft and non-tender without masses, organomegaly or hernias noted. no renal  bruits  Pulses:  R and L carotid,radial,femoral,dorsalis pedis and posterior tibial pulses are full and equal bilaterally Extremities:  No clubbing, cyanosis, edema, or deformity noted with normal full range of motion of all joints.   Neurologic:  slt hand tremor ( is anxious)  Skin:  Intact without suspicious lesions or rashes Cervical Nodes:  No lymphadenopathy noted Psych:  normal affect, talkative and pleasant    Impression & Recommendations:  Problem # 1:  HYPERTENSION (ICD-401.9) Assessment Improved  bp is better now on two times a day toprol pt will watch her bp and pulse closely f/u 6 wk Her updated medication list for this problem includes:    Maxzide 75-50 Mg Tabs (Triamterene-hctz) .Marland Kitchen... 1/2 by mouth once daily    Toprol Xl 50 Mg Xr24h-tab (Metoprolol succinate) .Marland Kitchen... 1 by mouth twice a day    Norvasc 5 Mg Tabs (Amlodipine besylate) .Marland Kitchen... 1 by mouth once daily in am  BP today: 156/66 Prior BP: 148/76 (05/19/2010)  Labs Reviewed: K+: 4.2 (08/30/2009) Creat: : 1.2 (08/30/2009)   Chol: 217 (08/30/2009)   HDL: 56.40 (08/30/2009)   LDL: DEL (08/30/2008)   TG: 113.0 (08/30/2009)  Orders: Prescription Created Electronically [id number])  Problem # 2:  CHEST PAIN, ATYPICAL (ICD-786.59) Assessment: Deteriorated  suspect this was from reflux and poss fosamax better now  was assoc with burping and gas - then became anxious will try zantac hold fosamax  f/u 6 wk but update earlier if symptoms come back  Orders: Prescription Created Electronically [phone])  Problem # 3:  REACTION, ACUTE STRESS W/EMOTIONAL  DSTURB (ICD-308.0) Assessment: Improved much imp with buspar- will continue that   Complete Medication List: 1)  Maxzide 75-50 Mg Tabs (Triamterene-hctz) .... 1/2 by mouth once daily 2)  Toprol Xl 50 Mg Xr24h-tab (Metoprolol succinate) .Marland Kitchen.. 1 by mouth twice a day 3)  Indomethacin Caps (Indomethacin caps) .... Take by mouth as directed as needed 4)  Stool Softener Caps (Docusate sodium caps) .... Take 2 by mouth daily 5)  Fish Oil Caps (Omega-3 fatty acids caps) .... Take by mouth twice a day 6)  Fiber Tabs (Calcium polycarbophil tabs) .... Take by mouth as directed prn 7)  Meclizine Hcl 25 Mg Tabs (Meclizine hcl) .... Take by mouth four times a day as needed, or 2 12.5 mg. 8)  Pravachol 20 Mg Tabs (Pravastatin sodium) .... 1/2 tab  by mouth once daily 9)  Cranberry 250 Mg Caps (Cranberry) .... Take 1 capsule by mouth once a day 10)  Norvasc 5 Mg Tabs (Amlodipine besylate) .Marland Kitchen.. 1 by mouth once daily in am 11)  Allopurinol 100 Mg Tabs (Allopurinol) .... Take 1 tablet by mouth once a day 12)  Buspirone Hcl 15 Mg Tabs (Buspirone hcl) .... 1/2 pill by mouth two times a day 13)  Zantac 150 Mg Tabs (Ranitidine hcl) .Marland Kitchen.. 1 by mouth two times a day  Patient Instructions: 1)  continue two times a day toprol -- if dizzy or low bp let me know  2)  continue buspar  3)  hold the fosamax  4)  start zantac 150 mg two times a day  5)  follow up with me in 6 weeks 6)  if chest pain returns let me know  Prescriptions: TOPROL XL 50 MG XR24H-TAB (METOPROLOL SUCCINATE) 1 by mouth twice a day  #60 x 11   Entered and Authorized by:   [name] MD   Signed by:   [name] MD on 06/13/2010   Method used:   Electronically to        [facility]* (retail)       [address]       [place], [place]  [address]       Ph: [phone]       Fax: [phone]   RxID:   [id number] ZANTAC 150 MG TABS (RANITIDINE HCL) 1 by mouth two times a day  #60 x 5   Entered and Authorized by:   [name] MD   Signed by:   [name] MD on 06/13/2010   Method used:   Electronically to        [facility]* (retail)       [address]       [place], [place]  [address]       Ph: [phone]       Fax: [phone]   RxID:   [id number]   Current Allergies (reviewed today): CEPHALOSPORINS * CLINDOMYCIN * EVISTA * TOPROL * CRESTOR ZOCOR MEVACOR ACE INHIBITORS [facility]

## 2010-10-28 NOTE — Letter (Signed)
Summary: Results Follow up Letter  [place] at [facility]  [address] [place], [place] [address]   Phone: [phone]  Fax: [phone]    03/28/2010 MRN: [id number]    [facility] 1975 [place] [address] [place], [place]  [address]    Dear Ms. HAFEN,  The following are the results of your recent test(s):  Test         Result    Pap Smear:        Normal _____  Not Normal _____ Comments: ______________________________________________________ Cholesterol: LDL(Bad cholesterol):         Your goal is less than:         HDL (Good cholesterol):       Your goal is more than: Comments:  ______________________________________________________ Mammogram:        Normal __X___  Not Normal _____ Comments:Repeat in one year.   ___________________________________________________________________ Hemoccult:        Normal _____  Not normal _______ Comments:    _____________________________________________________________________ Other Tests:    We routinely do not discuss normal results over the telephone.  If you desire a copy of the results, or you have any questions about this information we can discuss them at your next office visit.   Sincerely,    Idamae Schuller Philmore Lepore,MD  MT/ri

## 2010-10-28 NOTE — Letter (Signed)
Summary: Results Follow up Letter  [place] at [facility]  [address] [place], [place] [address]   Phone: [phone]  Fax: [phone]    04/14/2010 MRN: [id number]    [facility] 1975 [place] [address] [place], [place]  [address]    Dear Ms. SOU,  The following are the results of your recent test(s):  Test         Result    Pap Smear:        Normal __X___  Not Normal _____ Comments: ______________________________________________________ Cholesterol: LDL(Bad cholesterol):         Your goal is less than:         HDL (Good cholesterol):       Your goal is more than: Comments:  ______________________________________________________ Mammogram:        Normal _____  Not Normal _____ Comments:  ___________________________________________________________________ Hemoccult:        Normal _____  Not normal _______ Comments:    _____________________________________________________________________ Other Tests:    We routinely do not discuss normal results over the telephone.  If you desire a copy of the results, or you have any questions about this information we can discuss them at your next office visit.   Sincerely,    Idamae Schuller [place],MD  [place]/ri

## 2010-10-28 NOTE — Assessment & Plan Note (Signed)
Summary: NURSE VISIT FOR URINE FOR CULTURE ONLY/RI  Pt came today for urine for culture only. Pt says she feels better overall. Dr. Milinda Antis can you print and order for the urine culture. Thank you. Nurse Visit  order done I am not charging for nurse visit- just the culture today- since nothing else was done    Allergies: 1)  Cephalosporins 2)  * Clindomycin 3)  * Evista 4)  * Toprol 5)  * Crestor 6)  Zocor 7)  Mevacor 8)  Ace Inhibitors 9)  Welchol  Orders Added: 1)  T-Culture, Urine [[id number]]  Appended Document: NURSE VISIT FOR URINE FOR CULTURE ONLY/RI

## 2010-10-28 NOTE — Assessment & Plan Note (Signed)
Summary: CHECK BP AND HEART RATE/CLE   Vital Signs:  Patient profile:   77 year old female Height:      62 inches Weight:      187.25 pounds BMI:     34.37 Temp:     98.3 degrees F oral Pulse rate:   64 / minute Pulse rhythm:   regular BP sitting:   148 / 76  (left arm) Cuff size:   large  Vitals Entered By: Lewanda Rife LPN (May 19, 2010 3:12 PM) CC: Wants BP and heart rate checked   History of Present Illness: feels poorly -- no energy and shaky inside   some trauma in family few weeks ago -- problems with a grandchild  could be anxious   bp has been labilt -- from 119/57- 144/62  pulse in high 40s to 50s   took last toprol today   ? if felt like this before  has never been quite this anxious  wants to cry all the time  thinks this has worsened with age   cannot walk due to "bad legs" -- no exercise  has an exercise bike-- has not tried it      Allergies: 1)  Cephalosporins 2)  * Clindomycin 3)  * Evista 4)  * Toprol 5)  * Crestor 6)  Zocor 7)  Mevacor 8)  Ace Inhibitors 9)  Welchol  Past History:  Past Medical History: Last updated: 01/30/2009 Diverticulitis, hx of Diverticulosis, colon Gout Hyperlipidemia Hypertension Osteoarthritis squamous cell lesion removed  basal cell lesion removed  osteopenia   derm   Past Surgical History: Last updated: 03/20/2009 Appendectomy Cataract extraction (04/2006) Cholecystectomy Colectomy- partial (09/1999) Back surgery Neck surgery Stress cardiolite (1997) Colonoscopy- diverticulosis (09/1996,  05/1999) Dexa- osteopenia (2000),  improved (01/2001) CT abd- admission for diverticulosis (07/1999) Hosp. - facial cellulitis (02/2000) Colonoscopy- hemorrhoids (09/2001) Surgery knee, med meniscal tear dexa osteopenia (5/10)- hip -1.0 T score  6/10 nuclear stress study- normal  Family History: Last updated: 02/04/2007 Father: throat, bladder cancer. DM, HTN. died from MI during surgery Mother:  died from CVA. Alzheimer's, HTN Siblings: 3 brothers, 2 sisters  Social History: Last updated: 02/04/2007 Marital Status: Married Children: 3 sons Occupation: retired  Risk Factors: Smoking Status: never (02/04/2007)  Review of Systems General:  Denies fatigue, loss of appetite, and malaise. Eyes:  Denies blurring and eye irritation. CV:  Denies chest pain or discomfort, lightheadness, and palpitations. Resp:  Denies cough, shortness of breath, and wheezing. GI:  Denies abdominal pain, bloody stools, and change in bowel habits. GU:  Denies urinary frequency. MS:  Complains of low back pain, mid back pain, and stiffness. Derm:  Denies poor wound healing and rash. Neuro:  Denies numbness and tingling. Psych:  Complains of anxiety and panic attacks; denies sense of great danger and suicidal thoughts/plans. Endo:  Denies excessive thirst and excessive urination. Heme:  Denies abnormal bruising and bleeding.  Physical Exam  General:  overweight but generally well appearing  Head:  normocephalic, atraumatic, and no abnormalities observed.   Eyes:  vision grossly intact, pupils equal, pupils round, and pupils reactive to light.  no conjunctival pallor, injection or icterus  Mouth:  pharynx pink and moist.   Neck:  supple with full rom and no masses or thyromegally, no JVD or carotid bruit  Chest Wall:  No deformities, masses, or tenderness noted. Lungs:  Normal respiratory effort, chest expands symmetrically. Lungs are clear to auscultation, no crackles or wheezes. Heart:  Normal rate and  regular rhythm. S1 and S2 normal without gallop, murmur, click, rub or other extra sounds. Abdomen:  Bowel sounds positive,abdomen soft and non-tender without masses, organomegaly or hernias noted. no renal  bruits  Msk:  No deformity or scoliosis noted of thoracic or lumbar spine.  poor rom LS and legs  Pulses:  R and L carotid,radial,femoral,dorsalis pedis and posterior tibial pulses are full and  equal bilaterally Extremities:  No clubbing, cyanosis, edema, or deformity noted with normal full range of motion of all joints.   Neurologic:  slt hand tremor ( is anxious)  Skin:  Intact without suspicious lesions or rashes Cervical Nodes:  No lymphadenopathy noted Psych:  generally anxious and a bit tearful     Impression & Recommendations:  Problem # 1:  ANXIETY STATE NEC (ICD-300.09) Assessment Deteriorated  this is worse with family stressor has good support - and declines counseling (though I feel she would benefit from it )  will try buspar- disc poss side eff disc situational stress/ coping mech/ symptoms/ tx opt and poss side eff in detail f/u in 1 mo  spent 25 minutes face to face time with pt , over 50% of which was spent on counseling and coordination of care   Her updated medication list for this problem includes:    Buspirone Hcl 15 Mg Tabs (Buspirone hcl) .Marland Kitchen... 1/2 pill by mouth two times a day  Orders: Prescription Created Electronically [phone])  Problem # 2:  BRADYCARDIA (ICD-427.89)  due to hr in 40s at home - cut toprol to 50 mg daily f/u 1 mo  will watch bp closley Her updated medication list for this problem includes:    Toprol Xl 50 Mg Xr24h-tab (Metoprolol succinate) .Marland Kitchen... 1 by mouth once daily  Orders: Prescription Created Electronically [id number])  Problem # 3:  HYPERTENSION (ICD-401.9) Assessment: Unchanged  this seems to be in good control but will watch closelyin light of cutting her toprol  Her updated medication list for this problem includes:    Maxzide 75-50 Mg Tabs (Triamterene-hctz) .Marland Kitchen... 1/2 by mouth once daily    Toprol Xl 50 Mg Xr24h-tab (Metoprolol succinate) .Marland Kitchen... 1 by mouth once daily    Norvasc 5 Mg Tabs (Amlodipine besylate) .Marland Kitchen... 1 by mouth once daily in am  BP today: 148/76 Prior BP: 130/68 (04/04/2010)  Labs Reviewed: K+: 4.2 (08/30/2009) Creat: : 1.2 (08/30/2009)   Chol: 217 (08/30/2009)   HDL: 56.40 (08/30/2009)   LDL:  DEL (08/30/2008)   TG: 113.0 (08/30/2009)  Orders: Prescription Created Electronically [phone])  Complete Medication List: 1)  Maxzide 75-50 Mg Tabs (Triamterene-hctz) .... 1/2 by mouth once daily 2)  Toprol Xl 50 Mg Xr24h-tab (Metoprolol succinate) .Marland Kitchen.. 1 by mouth once daily 3)  Indomethacin Caps (Indomethacin caps) .... Take by mouth as directed as needed 4)  Stool Softener Caps (Docusate sodium caps) .... Take 2 by mouth daily 5)  Fish Oil Caps (Omega-3 fatty acids caps) .... Take by mouth twice a day 6)  Fiber Tabs (Calcium polycarbophil tabs) .... Take by mouth as directed prn 7)  Meclizine Hcl 25 Mg Tabs (Meclizine hcl) .... Take by mouth four times a day as needed, or 2 12.5 mg. 8)  Pravachol 20 Mg Tabs (Pravastatin sodium) .... 1/2 tab by mouth once daily 9)  Cranberry 250 Mg Caps (Cranberry) .... Take 1 capsule by mouth once a day 10)  Fosamax 70 Mg Tabs (Alendronate sodium) .Marland Kitchen.. 1 by mouth once weekly as directed 11)  Norvasc 5 Mg Tabs (Amlodipine  besylate) .Marland Kitchen.. 1 by mouth once daily in am 12)  Allopurinol 100 Mg Tabs (Allopurinol) .... Take 1 tablet by mouth once a day 13)  Buspirone Hcl 15 Mg Tabs (Buspirone hcl) .... 1/2 pill by mouth two times a day  Patient Instructions: 1)  call [facility] and cancel your px  2)  I am changing your toprol 100 to 50 for now due to slow heart rate  3)  I sent that to [facility] 4)  start the buspar for anxiety - also sent to [facility]  5)  follow up with me in 1 month  6)  if you feel worse or like you are having side effects or worse anxiety or depression- please update me  Prescriptions: TOPROL XL 50 MG XR24H-TAB (METOPROLOL SUCCINATE) 1 by mouth once daily  #30 x 3   Entered and Authorized by:   [name] MD   Signed by:   [name] MD on 05/19/2010   Method used:   Electronically to        [facility]* (retail)       [address]       [place], [place]  [address]       Ph: [phone]       Fax: [phone]   RxID:    [id number] BUSPIRONE HCL 15 MG TABS (BUSPIRONE HCL) 1/2 pill by mouth two times a day  #30 x 11   Entered and Authorized by:   [name] MD   Signed by:   [name] MD on 05/19/2010   Method used:   Electronically to        [facility]* (retail)       [address]       [place], [place]  [address]       Ph: [phone]       Fax: [phone]   RxID:   [id number]   Current Allergies (reviewed today): CEPHALOSPORINS * CLINDOMYCIN * EVISTA * TOPROL * CRESTOR ZOCOR MEVACOR ACE INHIBITORS [facility]

## 2010-10-28 NOTE — Progress Notes (Signed)
Summary: Allopurinol 100mg  rx request  Phone Note Refill Request   Refills Requested: Medication #1:  ALLOPURINOL 100 MG TABS Take 1 tablet by mouth once a day. [facility] mail order requested allopurinol 100mg  taking Take 1 tablet by mouth once a day  #90. Added back to med list. Pt stated thought allopurinol might have caused a bladder problem so stopped med  but pt started back on med when realized that was not the problem.Please advise.    Method Requested: Fax to Mail Away Pharmacy Initial call taken by: Lewanda Rife LPN,  March 28, 6044 8:56 AM  Follow-up for Phone Call        printed in put in nurse in box for fax Follow-up by: [name] MD,  March 28, 2010 11:10 AM  Additional Follow-up for Phone Call Additional follow up Details #1::        Rx was sent electronically. Spoke with Dondra Spry at [facility] [phone] and she said [facility] can accept electronic rx now.Lewanda Rife LPN  March 28, 8294 11:31 AM     Prescriptions: ALLOPURINOL 100 MG TABS (ALLOPURINOL) Take 1 tablet by mouth once a day  #90 x 3   Entered and Authorized by:   [name] MD   Signed by:   [name] MD on 03/28/2010   Method used:   Printed then faxed to ...       [facility]* (retail)             ,          Ph: [phone]       Fax: [phone]   RxID:   [id number]

## 2010-10-28 NOTE — Progress Notes (Signed)
Summary: metrogel  Phone Note Call from Patient Call back at Home Phone [phone]   Caller: Patient Call For: [name] MD Summary of Call: Patient is asking if there is something that she could use OTC that would work about the same as the [employer]. Please advise. Initial call taken by: Melody Comas,  March 18, 2010 4:46 PM  Follow-up for Phone Call        Pt having burning in the vaginal area with and without urinating which she thinks is the same thing as she was treated for late last year (per record review, thought initially to be urinatry infection and turned out to be vaginal infection.) Pt promises she will come in to be seen if sxs don't totally resolve on metrogel again.    Prescriptions: METROGEL-VAGINAL 0.75 % GEL (METRONIDAZOLE) 1 applicatorful intravaginally at bedtime for 7 days  #1 tube x 0   Entered and Authorized by:   Shaune Leeks MD   Signed by:   Shaune Leeks MD on 03/18/2010   Method used:   Electronically to        [facility]* (retail)       [address]       [place], [place]  [address]       Ph: [phone]       Fax: [phone]   RxID:   [id number]

## 2010-10-28 NOTE — Progress Notes (Signed)
Summary: Elevated BP  Phone Note Call from Patient Call back at [phone]   Caller: Patient Call For: [name] MD Summary of Call: Pt saw Dr Milinda Antis 3 weeks ago and toprol 50mg  was changed from twice a day to once a day. Pt had felt fine and BP was not up. Pt went to [place] on Mon. and on Tues pt had chest pain and paramedics cked pt EKG was OK and pt was instructed to take Toprol XL50mg   one in the AM and one in the PM. On Wed. 06/11/10 in am pt's BP was 147/97 p 53 and the PM pt's BP was 150/65 p53. Pt said she felt good on Wed. Pt left [place] this morning and has returned home. Bp now is 171/67 p52. Pt feels shakey and nervous, no chest pain and no shortness of breath. Pt wondered if she should take Toprol 50mg  again this evening. Pt made appt to see Dr Milinda Antis 06/13/10 at 10:15. Dr Milinda Antis is out of office this afternoon. Please advise.  Initial call taken by: Lewanda Rife LPN,  June 12, 2010 2:42 PM  Follow-up for Phone Call        yes ok to take another toprol and to keep appt with Dr. Milinda Antis tomorrow. Ruthe Mannan MD  June 12, 2010 2:46 PM   Additional Follow-up for Phone Call Additional follow up Details #1::        Patient notified as instructed by telephone. Pt will keep appt with Dr Milinda Antis in AM and if develops chest pain or difficulty breathing tonight will go to ER if necessary.Lewanda Rife LPN  June 12, 2010 2:58 PM

## 2010-10-28 NOTE — Miscellaneous (Signed)
Summary: mammogram screening   Clinical Lists Changes  Observations: Added new observation of MAMMO DUE: 03/2011 (03/28/2010 13:25) Added new observation of MAMMOGRAM: normal (03/21/2010 13:25)      Preventive Care Screening  Mammogram:    Date:  03/21/2010    Next Due:  03/2011    Results:  normal

## 2010-10-28 NOTE — Assessment & Plan Note (Signed)
Summary: ? UTI/ VAGINAL INFECTION   Vital Signs:  Patient profile:   77 year old female Height:      62 inches Weight:      191.50 pounds BMI:     35.15 Temp:     98.6 degrees F oral Pulse rate:   60 / minute Pulse rhythm:   regular BP sitting:   130 / 68  (left arm) Cuff size:   large  Vitals Entered By: Lewanda Rife LPN (April 04, 1609 3:23 PM) CC: ?UTI or Vaginal infection Pt said burning sensation perineal area worse when urinates   History of Present Illness: is having some burning on and off for 2-3 weeks  is "inside " -- sometime when urinating and other times not   has had bv in the past  called and was called in - metrogel vaginal - it helped but did not get rid of it completely no itching  no frequency   some discomfort in bladder on and off  no back pain no nausea or fever  no blood in urine   vulvar area seems a bit raw  has used some vaseline- that relieves the burn  no bubble baths  avoids soaps and detergents   Allergies: 1)  Cephalosporins 2)  * Clindomycin 3)  * Evista 4)  * Toprol 5)  * Crestor 6)  Zocor 7)  Mevacor 8)  Ace Inhibitors 9)  Welchol  Past History:  Past Medical History: Last updated: 01/30/2009 Diverticulitis, hx of Diverticulosis, colon Gout Hyperlipidemia Hypertension Osteoarthritis squamous cell lesion removed  basal cell lesion removed  osteopenia   derm   Past Surgical History: Last updated: 03/20/2009 Appendectomy Cataract extraction (04/2006) Cholecystectomy Colectomy- partial (09/1999) Back surgery Neck surgery Stress cardiolite (1997) Colonoscopy- diverticulosis (09/1996,  05/1999) Dexa- osteopenia (2000),  improved (01/2001) CT abd- admission for diverticulosis (07/1999) Hosp. - facial cellulitis (02/2000) Colonoscopy- hemorrhoids (09/2001) Surgery knee, med meniscal tear dexa osteopenia (5/10)- hip -1.0 T score  6/10 nuclear stress study- normal  Family History: Last updated:  02/04/2007 Father: throat, bladder cancer. DM, HTN. died from MI during surgery Mother: died from CVA. Alzheimer's, HTN Siblings: 3 brothers, 2 sisters  Social History: Last updated: 02/04/2007 Marital Status: Married Children: 3 sons Occupation: retired  Risk Factors: Smoking Status: never (02/04/2007)  Review of Systems General:  Denies chills, fatigue, fever, loss of appetite, and malaise. Eyes:  Denies blurring and eye irritation. CV:  Denies chest pain or discomfort, lightheadness, and palpitations. Resp:  Denies cough and wheezing. GI:  Denies abdominal pain, bloody stools, and change in bowel habits. GU:  Complains of dysuria; denies abnormal vaginal bleeding, discharge, and hematuria. MS:  Denies joint pain, joint redness, and joint swelling. Derm:  Denies itching, lesion(s), poor wound healing, and rash. Neuro:  Denies numbness and tingling. Endo:  Denies cold intolerance and heat intolerance.  Physical Exam  General:  overweight but generally well appearing  Head:  normocephalic, atraumatic, and no abnormalities observed.   Neck:  supple with full rom and no masses or thyromegally, no JVD or carotid bruit  Lungs:  Normal respiratory effort, chest expands symmetrically. Lungs are clear to auscultation, no crackles or wheezes. Heart:  Normal rate and regular rhythm. S1 and S2 normal without gallop, murmur, click, rub or other extra sounds. Abdomen:  no suprapubic tenderness or fullness felt  Genitalia:  normal introitus, no external lesions, mucosa pink and moist, and no vaginal or cervical lesions.   wet prep done  Msk:  no CVA tenderness  Extremities:  No clubbing, cyanosis, edema, or deformity noted with normal full range of motion of all joints.   Skin:  Intact without suspicious lesions or rashes Cervical Nodes:  No lymphadenopathy noted Inguinal Nodes:  No significant adenopathy Psych:  normal affect, talkative and pleasant    Impression &  Recommendations:  Problem # 1:  VAGINITIS, BACTERIAL (ICD-616.10) Assessment Deteriorated  with wbc and clue cells even after tx with metrogel will tx with flagyl oral also urine cx for dysuria  The following medications were removed from the medication list:    Metrogel-vaginal 0.75 % Gel (Metronidazole) .Marland Kitchen... 1 applicatorful intravaginally at bedtime for 7 days as needed Her updated medication list for this problem includes:    Flagyl 500 Mg Tabs (Metronidazole) .Marland Kitchen... 1 by mouth two times a day for 7 days  Orders: Prescription Created Electronically [phone]) UA Dipstick W/ Micro (manual) (62130)  Problem # 2:  DYSURIA (ICD-788.1)  likely due to above  cx urine with hx of utis  Her updated medication list for this problem includes:    Flagyl 500 Mg Tabs (Metronidazole) .Marland Kitchen... 1 by mouth two times a day for 7 days  Orders: T-Culture, Urine ([id number]) Specimen Handling ([address]) Prescription Created Electronically [phone]) UA Dipstick W/ Micro (manual) (81000)  Complete Medication List: 1)  Maxzide 75-50 Mg Tabs (Triamterene-hctz) .... 1/2 by mouth once daily 2)  Toprol Xl 100 Mg Xr24h-tab (Metoprolol succinate) .Marland Kitchen.. 1 by mouth once daily 3)  Indomethacin Caps (Indomethacin caps) .... Take by mouth as directed prn 4)  Stool Softener Caps (Docusate sodium caps) .... Take 2 by mouth daily 5)  Fish Oil Caps (Omega-3 fatty acids caps) .... Take by mouth twice a day 6)  Fiber Tabs (Calcium polycarbophil tabs) .... Take by mouth as directed prn 7)  Meclizine Hcl 25 Mg Tabs (Meclizine hcl) .... Take by mouth four times a day as needed, or 2 12.5 mg. 8)  Pravachol 20 Mg Tabs (Pravastatin sodium) .... 1/2 tab by mouth once daily 9)  Cranberry 250 Mg Caps (Cranberry) .... Take 1 capsule by mouth once a day 10)  Fosamax 70 Mg Tabs (Alendronate sodium) .Marland Kitchen.. 1 by mouth once weekly as directed 11)  Norvasc 5 Mg Tabs (Amlodipine besylate) .Marland Kitchen.. 1 by mouth once daily in am 12)  Allopurinol 100  Mg Tabs (Allopurinol) .... Take 1 tablet by mouth once a day 13)  Flagyl 500 Mg Tabs (Metronidazole) .Marland Kitchen.. 1 by mouth two times a day for 7 days  Patient Instructions: 1)  take the flagyl as directed for vaginal infection 2)  update me if not improving within a week  3)  drink lots of water  4)  we will also do a urine culture and update you with results  5)  eat yogurt  Prescriptions: FLAGYL 500 MG TABS (METRONIDAZOLE) 1 by mouth two times a day for 7 days  #14 x 0   Entered and Authorized by:   [name] MD   Signed by:   [name] MD on 04/04/2010   Method used:   Electronically to        [facility]* (retail)       [address]       [place], [place]  [address]       Ph: [phone]       Fax: [phone]   RxID:   [id number]   Current Allergies (reviewed today): CEPHALOSPORINS * CLINDOMYCIN * EVISTA *  TOPROL Malachy Chamber MEVACOR ACE INHIBITORS [facility]  Laboratory Results   Urine Tests  Date/Time Received: April 04, 2010 3:26 PM  Date/Time Reported: April 04, 2010 3:26 PM   Routine Urinalysis   Color: yellow Appearance: Hazy Glucose: negative   (Normal Range: Negative) Bilirubin: negative   (Normal Range: Negative) Ketone: negative   (Normal Range: Negative) Spec. Gravity: 1.010   (Normal Range: 1.003-1.035) Blood: trace-intact   (Normal Range: Negative) Protein: trace   (Normal Range: Negative) Urobilinogen: 0.2   (Normal Range: 0-1) Nitrite: negative   (Normal Range: Negative) Leukocyte Esterace: small   (Normal Range: Negative)  Urine Microscopic WBC/HPF: 3-4 RBC/HPF: 0 Bacteria/HPF: 0 Mucous/HPF: few Epithelial/HPF: 1 Crystals/HPF: 0 Casts/LPF: 0 Yeast/HPF: 0 Other: 0      Wet Mount Source: vaginal  WBC/hpf: >20 Bacteria/hpf: 1+  Rods Clue cells/hpf: few  Negative whiff Yeast/hpf: none Wet Mount KOH: Negative Trichomonas/hpf: none      Laboratory Results   Urine Tests    Routine Urinalysis   Color:  yellow Appearance: Hazy Glucose: negative   (Normal Range: Negative) Bilirubin: negative   (Normal Range: Negative) Ketone: negative   (Normal Range: Negative) Spec. Gravity: 1.010   (Normal Range: 1.003-1.035) Blood: trace-intact   (Normal Range: Negative) Protein: trace   (Normal Range: Negative) Urobilinogen: 0.2   (Normal Range: 0-1) Nitrite: negative   (Normal Range: Negative) Leukocyte Esterace: small   (Normal Range: Negative)  Urine Microscopic WBC/hpf: 3-4 RBC/hpf: 0 Bacteria: 0 Mucous: few Epithelial: 1 Crystals/LPF: 0 Casts/LPF: 0 Yeast/HPF: 0 Other: 0      Wet Mount/KOH Source: vaginal  WBC/hpf 10-20 Bacteria/hpf 1+  Rods Clue cells/hpf few  Negative whiff Yeast/hpf none KOH Negative Trichomonas/hpf none

## 2010-10-28 NOTE — Assessment & Plan Note (Signed)
Summary: FLU SHOT/CLE   Nurse Visit   Allergies: 1)  Cephalosporins 2)  * Clindomycin 3)  * Evista 4)  * Toprol 5)  * Crestor 6)  Zocor 7)  Mevacor 8)  Ace Inhibitors 9)  Welchol  Orders Added: 1)  Flu Vaccine 77yrs + MEDICARE PATIENTS [Q2039] 2)  Administration Flu vaccine - MCR [G0008]   Flu Vaccine Consent Questions     Do you have a history of severe allergic reactions to this vaccine? no    Any prior history of allergic reactions to egg and/or gelatin? no    Do you have a sensitivity to the preservative Thimersol? no    Do you have a past history of Guillan-Barre Syndrome? no    Do you currently have an acute febrile illness? no    Have you ever had a severe reaction to latex? no    Vaccine information given and explained to patient? yes    Are you currently pregnant? no    Lot Number:[id number]   Exp Date:03/28/2011   Site Given  Left Deltoid IMu

## 2010-10-28 NOTE — Assessment & Plan Note (Signed)
Summary: BLADDER INF/DLO   Vital Signs:  Patient profile:   77 year old female Height:      62 inches Weight:      185.25 pounds BMI:     34.01 Temp:     98.4 degrees F oral Pulse rate:   76 / minute Pulse rhythm:   regular BP sitting:   146 / 78  (left arm) Cuff size:   large  Vitals Entered By: Delilah Shan CMA Era Parr Dull) (August 27, 2010 4:03 PM) CC: ? UTI   History of Present Illness: dysuria:yes, typical for patient duration of symptoms:3-4 days abdominal pain: yes, suprapubic fevers:no back pain:no vomiting:no other concerns: no  Allergies: 1)  Cephalosporins 2)  * Clindomycin 3)  * Evista 4)  * Toprol 5)  * Crestor 6)  Zocor 7)  Mevacor 8)  Ace Inhibitors 9)  Welchol  Review of Systems       See HPI.  Otherwise negative.    Physical Exam  General:  GEN: nad, alert and oriented NECK: supple CV: rrr.  PULM: ctab, no inc wob ABD: soft, +bs, suprapubic area tender EXT: no edema SKIN: no acute rash BACK: no CVA pain    Impression & Recommendations:  Problem # 1:  UTI (ICD-599.0) Start cipro, check cx and have patient restart the meds after collecting another sample should symptoms return.  She agrees.  Routine uti/med precautions given.   Her updated medication list for this problem includes:    Cipro 250 Mg Tabs (Ciprofloxacin hcl) .Marland Kitchen... 1 by mouth two times a day x3  Orders: T-Culture, Urine ([id number]) Specimen Handling (09811)  Complete Medication List: 1)  Maxzide 75-50 Mg Tabs (Triamterene-hctz) .... 1/2 by mouth once daily 2)  Toprol Xl 50 Mg Xr24h-tab (Metoprolol succinate) .Marland Kitchen.. 1 by mouth twice a day 3)  Indomethacin Caps (Indomethacin caps) .... Take by mouth as directed as needed 4)  Stool Softener Caps (Docusate sodium caps) .... Take 2 by mouth daily 5)  Fish Oil Caps (Omega-3 fatty acids caps) .... Take by mouth twice a day 6)  Fiber Tabs (Calcium polycarbophil tabs) .... Take by mouth as directed prn 7)  Meclizine Hcl 25 Mg  Tabs (Meclizine hcl) .... Take by mouth four times a day as needed, or 2 12.5 mg. 8)  Pravachol 20 Mg Tabs (Pravastatin sodium) .... 1/2 tab by mouth once daily 9)  Cranberry 250 Mg Caps (Cranberry) .... Take 1 capsule by mouth once a day 10)  Norvasc 5 Mg Tabs (Amlodipine besylate) .Marland Kitchen.. 1 by mouth once daily in am 11)  Allopurinol 100 Mg Tabs (Allopurinol) .... Take 1 tablet by mouth once a day 12)  Buspirone Hcl 15 Mg Tabs (Buspirone hcl) .... 1/2 pill by mouth two times a day 13)  Zantac 150 Mg Tabs (Ranitidine hcl) .Marland Kitchen.. 1 by mouth two times a day 14)  Cipro 250 Mg Tabs (Ciprofloxacin hcl) .Marland Kitchen.. 1 by mouth two times a day x3  Patient Instructions: 1)  Start the antibiotics today and we'll let you know about the culture.   2)  If you aren't improving, let us know. 3)  If you have another episode, collect a urine sample and restart the antibiotics.  Drop off the urine sample for [place] to culture.  Standing order for UCx 599.0.  Prescriptions: CIPRO 250 MG TABS (CIPROFLOXACIN HCL) 1 by mouth two times a day x3  #6 x 1   Entered and Authorized by:   Crawford Givens MD   Signed  by:   Crawford Givens MD on 08/27/2010   Method used:   Electronically to        [facility]* (retail)       [address]       [place], [place]  [address]       Ph: [phone]       Fax: [phone]   RxID:   [id number]    Orders Added: 1)  Est. Patient Level III [21194] 2)  T-Culture, Urine [[id number]] 3)  Specimen Handling [99000]    Current Allergies (reviewed today): CEPHALOSPORINS * CLINDOMYCIN * EVISTA * TOPROL * CRESTOR ZOCOR MEVACOR ACE INHIBITORS [facility]  Laboratory Results   Urine Tests  Date/Time Received: August 27, 2010 4:13 PM   Routine Urinalysis   Color: lt. yellow Appearance: Cloudy Glucose: negative   (Normal Range: Negative) Bilirubin: negative   (Normal Range: Negative) Ketone: negative   (Normal Range: Negative) Spec. Gravity: 1.020   (Normal Range:  1.003-1.035) Blood: small   (Normal Range: Negative) pH: 6.0   (Normal Range: 5.0-8.0) Protein: negative   (Normal Range: Negative) Urobilinogen: 0.2   (Normal Range: 0-1) Nitrite: positive   (Normal Range: Negative) Leukocyte Esterace: moderate   (Normal Range: Negative)

## 2010-10-28 NOTE — Assessment & Plan Note (Signed)
Summary: 6 WEEK FOLLOW UP/[facility]   Vital Signs:  Patient profile:   77 year old female Height:      62 inches Weight:      188.25 pounds BMI:     34.56 Temp:     98.1 degrees F oral Pulse rate:   60 / minute Pulse rhythm:   regular BP sitting:   126 / 68  (left arm) Cuff size:   large  Vitals Entered By: Lewanda Rife LPN (July 23, 2010 9:25 AM) CC: six wk f/u   History of Present Illness: here for f/u of HTN and anxiety and chest pain from gerd  wt is up 1lb  bp is great at 126/66 today pulse of 60 on her two times a day toprol now   at home bp is usually below 130 systolid and 60s diastolic  usually pulse in 50s  is feeling ok overall   tries not to stand up quickly   occ vertigo - takes a meclizine and that wipes it out   cp-- last visit thought to be from gerd started zantac and holding fosamax  anx- on buspar-- much better   Allergies: 1)  Cephalosporins 2)  * Clindomycin 3)  * Evista 4)  * Toprol 5)  * Crestor 6)  Zocor 7)  Mevacor 8)  Ace Inhibitors 9)  Welchol  Past History:  Past Medical History: Last updated: 06/13/2010 Diverticulitis, hx of Diverticulosis, colon Gout Hyperlipidemia Hypertension Osteoarthritis squamous cell lesion removed  basal cell lesion removed  osteopenia  GERD/ atypical chest pain derm   Past Surgical History: Last updated: 03/20/2009 Appendectomy Cataract extraction (04/2006) Cholecystectomy Colectomy- partial (09/1999) Back surgery Neck surgery Stress cardiolite (1997) Colonoscopy- diverticulosis (09/1996,  05/1999) Dexa- osteopenia (2000),  improved (01/2001) CT abd- admission for diverticulosis (07/1999) Hosp. - facial cellulitis (02/2000) Colonoscopy- hemorrhoids (09/2001) Surgery knee, med meniscal tear dexa osteopenia (5/10)- hip -1.0 T score  6/10 nuclear stress study- normal  Family History: Last updated: 02/04/2007 Father: throat, bladder cancer. DM, HTN. died from MI during surgery Mother:  died from CVA. Alzheimer's, HTN Siblings: 3 brothers, 2 sisters  Social History: Last updated: 02/04/2007 Marital Status: Married Children: 3 sons Occupation: retired  Risk Factors: Smoking Status: never (02/04/2007)  Review of Systems General:  Denies fever, loss of appetite, and malaise. Eyes:  Denies blurring and eye irritation. CV:  Denies chest pain or discomfort, palpitations, shortness of breath with exertion, and swelling of feet. Resp:  Denies cough, shortness of breath, and sputum productive. GI:  Denies abdominal pain, change in bowel habits, indigestion, and nausea. MS:  Complains of stiffness; denies joint pain, joint redness, joint swelling, muscle aches, and cramps. Derm:  Denies itching, lesion(s), poor wound healing, and rash. Neuro:  Denies headaches, numbness, and tingling. Psych:  Denies panic attacks and sense of great danger. Endo:  Denies cold intolerance, excessive thirst, excessive urination, and heat intolerance. Heme:  Denies abnormal bruising and bleeding.  Physical Exam  General:  overweight but generally well appearing  Head:  normocephalic, atraumatic, and no abnormalities observed.   Eyes:  vision grossly intact, pupils equal, pupils round, and pupils reactive to light.  no conjunctival pallor, injection or icterus  Mouth:  pharynx pink and moist.   Neck:  supple with full rom and no masses or thyromegally, no JVD or carotid bruit  Chest Wall:  No deformities, masses, or tenderness noted. Lungs:  Normal respiratory effort, chest expands symmetrically. Lungs are clear to auscultation, no crackles or  wheezes. Heart:  Normal rate and regular rhythm. S1 and S2 normal without gallop, murmur, click, rub or other extra sounds. Abdomen:  Bowel sounds positive,abdomen soft and non-tender without masses, organomegaly or hernias noted. no renal bruits  Msk:  No deformity or scoliosis noted of thoracic or lumbar spine.   Pulses:  R and L  carotid,radial,femoral,dorsalis pedis and posterior tibial pulses are full and equal bilaterally Extremities:  No clubbing, cyanosis, edema, or deformity noted with normal full range of motion of all joints.   Neurologic:  sensation intact to light touch, gait normal, and DTRs symmetrical and normal.   Skin:  Intact without suspicious lesions or rashes Cervical Nodes:  No lymphadenopathy noted Psych:  normal affect, talkative and pleasant    Impression & Recommendations:  Problem # 1:  OSTEOPENIA (ICD-733.90) Assessment Unchanged off fosamax for now and cp gone  rev ca and D check D level today Orders: Venipuncture (16109) TLB-Lipid Panel (80061-LIPID) TLB-Renal Function Panel (80069-RENAL) TLB-CBC Platelet - w/Differential (85025-CBCD) TLB-Hepatic/Liver Function Pnl (80076-HEPATIC) TLB-TSH (Thyroid Stimulating Hormone) (84443-TSH) T-Vitamin D (25-Hydroxy) (60454-09811)  Problem # 2:  CHEST PAIN, ATYPICAL (ICD-786.59) Assessment: Improved this is resolved with tx of gerd with zantac and holding bisphosphenate will continue to monitor  Problem # 3:  RENAL INSUFFICIENCY (ICD-588.9) Assessment: Unchanged lab today  disc imp of good water intake  Problem # 4:  HYPERTENSION (ICD-401.9) Assessment: Improved  very good control watching for low pulse -- no orthostasis refil med lab today f/u 6 mo  recommend wt loss  Her updated medication list for this problem includes:    Maxzide 75-50 Mg Tabs (Triamterene-hctz) .Marland Kitchen... 1/2 by mouth once daily    Toprol Xl 50 Mg Xr24h-tab (Metoprolol succinate) .Marland Kitchen... 1 by mouth twice a day    Norvasc 5 Mg Tabs (Amlodipine besylate) .Marland Kitchen... 1 by mouth once daily in am  Orders: Venipuncture (91478) TLB-Lipid Panel (80061-LIPID) TLB-Renal Function Panel (80069-RENAL) TLB-CBC Platelet - w/Differential (85025-CBCD) TLB-Hepatic/Liver Function Pnl (80076-HEPATIC) TLB-TSH (Thyroid Stimulating Hormone) (84443-TSH) T-Vitamin D (25-Hydroxy)  (29562-13086)  BP today: 126/68 Prior BP: 156/66 (06/13/2010)  Labs Reviewed: K+: 4.2 (08/30/2009) Creat: : 1.2 (08/30/2009)   Chol: 217 (08/30/2009)   HDL: 56.40 (08/30/2009)   LDL: DEL (08/30/2008)   TG: 113.0 (08/30/2009)  Problem # 5:  HYPERLIPIDEMIA (ICD-272.4) Assessment: Unchanged  lab today on statin and diet  rev low sat fat diet  f/u 6 mo  Her updated medication list for this problem includes:    Pravachol 20 Mg Tabs (Pravastatin sodium) .Marland Kitchen... 1/2 tab by mouth once daily  Orders: Venipuncture (57846) TLB-Lipid Panel (80061-LIPID) TLB-Renal Function Panel (80069-RENAL) TLB-CBC Platelet - w/Differential (85025-CBCD) TLB-Hepatic/Liver Function Pnl (80076-HEPATIC) TLB-TSH (Thyroid Stimulating Hormone) (84443-TSH) T-Vitamin D (25-Hydroxy) (96295-28413)  Labs Reviewed: SGOT: 24 (08/30/2009)   SGPT: 18 (08/30/2009)   HDL:56.40 (08/30/2009), 53.40 (06/19/2009)  LDL:DEL (08/30/2008), DEL (07/16/2008)  Chol:217 (08/30/2009), 217 (06/19/2009)  Trig:113.0 (08/30/2009), 131.0 (06/19/2009)  Complete Medication List: 1)  Maxzide 75-50 Mg Tabs (Triamterene-hctz) .... 1/2 by mouth once daily 2)  Toprol Xl 50 Mg Xr24h-tab (Metoprolol succinate) .Marland Kitchen.. 1 by mouth twice a day 3)  Indomethacin Caps (Indomethacin caps) .... Take by mouth as directed as needed 4)  Stool Softener Caps (Docusate sodium caps) .... Take 2 by mouth daily 5)  Fish Oil Caps (Omega-3 fatty acids caps) .... Take by mouth twice a day 6)  Fiber Tabs (Calcium polycarbophil tabs) .... Take by mouth as directed prn 7)  Meclizine Hcl 25 Mg Tabs (Meclizine hcl) .Marland KitchenMarland KitchenMarland Kitchen  Take by mouth four times a day as needed, or 2 12.5 mg. 8)  Pravachol 20 Mg Tabs (Pravastatin sodium) .... 1/2 tab by mouth once daily 9)  Cranberry 250 Mg Caps (Cranberry) .... Take 1 capsule by mouth once a day 10)  Norvasc 5 Mg Tabs (Amlodipine besylate) .Marland Kitchen.. 1 by mouth once daily in am 11)  Allopurinol 100 Mg Tabs (Allopurinol) .... Take 1 tablet by mouth  once a day 12)  Buspirone Hcl 15 Mg Tabs (Buspirone hcl) .... 1/2 pill by mouth two times a day 13)  Zantac 150 Mg Tabs (Ranitidine hcl) .Marland Kitchen.. 1 by mouth two times a day  Patient Instructions: 1)  I'm glad you are doing better  2)  continue your current medicines  3)  labs today  4)  follow up with me in 6 months Prescriptions: TOPROL XL 50 MG XR24H-TAB (METOPROLOL SUCCINATE) 1 by mouth twice a day  #180 x 3   Entered and Authorized by:   [name] MD   Signed by:   [name] MD on 07/23/2010   Method used:   Print then Give to Patient   RxID:   [id number]    Orders Added: 1)  Venipuncture [56213] 2)  TLB-Lipid Panel [80061-LIPID] 3)  TLB-Renal Function Panel [80069-RENAL] 4)  TLB-CBC Platelet - w/Differential [85025-CBCD] 5)  TLB-Hepatic/Liver Function Pnl [80076-HEPATIC] 6)  TLB-TSH (Thyroid Stimulating Hormone) [84443-TSH] 7)  T-Vitamin D (25-Hydroxy) [08657-84696] 8)  Est. Patient Level IV [29528]    Current Allergies (reviewed today): CEPHALOSPORINS * CLINDOMYCIN * EVISTA * TOPROL * CRESTOR ZOCOR MEVACOR ACE INHIBITORS [facility]

## 2010-10-28 NOTE — Miscellaneous (Signed)
Summary: Allopurinol 100mg  update to med list  Medications Added ALLOPURINOL 100 MG TABS (ALLOPURINOL) Take 1 tablet by mouth once a day       Clinical Lists Changes  Medications: Added new medication of ALLOPURINOL 100 MG TABS (ALLOPURINOL) Take 1 tablet by mouth once a day     Current Allergies: CEPHALOSPORINS * CLINDOMYCIN * EVISTA * TOPROL * CRESTOR ZOCOR MEVACOR ACE INHIBITORS [facility]

## 2010-10-30 ENCOUNTER — Encounter (INDEPENDENT_AMBULATORY_CARE_PROVIDER_SITE_OTHER): Payer: Self-pay | Admitting: *Deleted

## 2010-10-30 ENCOUNTER — Ambulatory Visit: Admit: 2010-10-30 | Payer: Self-pay | Admitting: Family Medicine

## 2010-10-30 ENCOUNTER — Other Ambulatory Visit: Payer: Self-pay | Admitting: Family Medicine

## 2010-10-30 ENCOUNTER — Other Ambulatory Visit: Payer: Self-pay

## 2010-10-30 ENCOUNTER — Other Ambulatory Visit (INDEPENDENT_AMBULATORY_CARE_PROVIDER_SITE_OTHER): Payer: 59

## 2010-10-30 DIAGNOSIS — E785 Hyperlipidemia, unspecified: Secondary | ICD-10-CM

## 2010-10-30 LAB — LIPID PANEL
HDL: 58.3 mg/dL (ref >39.00)
Total CHOL/HDL Ratio: 4
Triglycerides: 169 mg/dL — ABNORMAL HIGH (ref 0.0–149.0)

## 2010-10-30 LAB — LDL CHOLESTEROL, DIRECT: Direct LDL: 150.8 mg/dL

## 2010-10-30 LAB — AST: AST: 22 U/L (ref 0–37)

## 2010-10-30 LAB — ALT: ALT: 15 U/L (ref 0–35)

## 2010-11-04 ENCOUNTER — Telehealth: Payer: Self-pay | Admitting: Family Medicine

## 2010-11-13 NOTE — Progress Notes (Signed)
Summary: pt is taking pravachol  Phone Note Call from Patient   Caller: Patient Call For: [name] MD Reason for Call: Talk to Doctor Summary of Call: Advised pt of lab results.  She says she is taking one half pravachol every day and has not missed any.  She is trying to watch her diet. Initial call taken by: Lowella Petties CMA, AAMA,  November 04, 2010 8:22 AM  Follow-up for Phone Call        really watch diet carefully you can raise your HDL (good cholesterol) by increasing exercise and eating omega 3 fatty acid supplement like fish oil or flax seed oil over the counter you can lower LDL (bad cholesterol) by limiting saturated fats in diet like red meat, fried foods, egg yolks, fatty breakfast meats, high fat dairy products and shellfish  if not improved with that we may need to consider inc dose or other options re check lipdi/ast/alt 272 in 1-2 mo please Follow-up by: [name] MD,  November 04, 2010 10:36 AM  Additional Follow-up for Phone Call Additional follow up Details #1::        Left message for patient to call back. Lewanda Rife LPN  November 04, 2010 10:46 AM   Advised pt, she has appt in april. Additional Follow-up by: Lowella Petties CMA, AAMA,  November 04, 2010 12:26 PM

## 2010-12-02 ENCOUNTER — Encounter: Payer: Self-pay | Admitting: Family Medicine

## 2010-12-02 DIAGNOSIS — K5730 Diverticulosis of large intestine without perforation or abscess without bleeding: Secondary | ICD-10-CM | POA: Insufficient documentation

## 2010-12-02 DIAGNOSIS — M109 Gout, unspecified: Secondary | ICD-10-CM | POA: Insufficient documentation

## 2010-12-02 DIAGNOSIS — I10 Essential (primary) hypertension: Secondary | ICD-10-CM | POA: Insufficient documentation

## 2010-12-02 DIAGNOSIS — M1990 Unspecified osteoarthritis, unspecified site: Secondary | ICD-10-CM | POA: Insufficient documentation

## 2010-12-02 DIAGNOSIS — C4491 Basal cell carcinoma of skin, unspecified: Secondary | ICD-10-CM | POA: Insufficient documentation

## 2010-12-02 DIAGNOSIS — K219 Gastro-esophageal reflux disease without esophagitis: Secondary | ICD-10-CM | POA: Insufficient documentation

## 2010-12-02 DIAGNOSIS — M8580 Other specified disorders of bone density and structure, unspecified site: Secondary | ICD-10-CM

## 2010-12-02 DIAGNOSIS — Z8719 Personal history of other diseases of the digestive system: Secondary | ICD-10-CM

## 2010-12-02 DIAGNOSIS — E785 Hyperlipidemia, unspecified: Secondary | ICD-10-CM | POA: Insufficient documentation

## 2011-01-02 LAB — URINALYSIS, ROUTINE W REFLEX MICROSCOPIC
Bilirubin Urine: NEGATIVE
Ketones, ur: NEGATIVE mg/dL
Nitrite: NEGATIVE
Protein, ur: NEGATIVE mg/dL

## 2011-01-02 LAB — URINE CULTURE

## 2011-01-02 LAB — POCT I-STAT, CHEM 8
BUN: 25 mg/dL — ABNORMAL HIGH (ref 6–23)
Calcium, Ion: 1.11 mmol/L — ABNORMAL LOW (ref 1.12–1.32)
Chloride: 105 mEq/L (ref 96–112)
Glucose, Bld: 98 mg/dL (ref 70–99)
Potassium: 3.9 mEq/L (ref 3.5–5.1)

## 2011-01-05 LAB — COMPREHENSIVE METABOLIC PANEL
ALT: 23 U/L (ref 0–35)
AST: 30 U/L (ref 0–37)
Alkaline Phosphatase: 61 U/L (ref 39–117)
Calcium: 9.4 mg/dL (ref 8.4–10.5)
GFR calc Af Amer: 53 mL/min — ABNORMAL LOW (ref >60)
Glucose, Bld: 108 mg/dL — ABNORMAL HIGH (ref 70–99)
Potassium: 4.5 mEq/L (ref 3.5–5.1)
Sodium: 139 mEq/L (ref 135–145)
Total Protein: 7.2 g/dL (ref 6.0–8.3)

## 2011-01-05 LAB — URINE MICROSCOPIC-ADD ON

## 2011-01-05 LAB — URINALYSIS, ROUTINE W REFLEX MICROSCOPIC
Hgb urine dipstick: NEGATIVE
Nitrite: NEGATIVE
Protein, ur: NEGATIVE mg/dL
Specific Gravity, Urine: 1.014 (ref 1.005–1.030)
Urobilinogen, UA: 0.2 mg/dL (ref 0.0–1.0)

## 2011-01-05 LAB — DIFFERENTIAL
Basophils Relative: 0 % (ref 0–1)
Eosinophils Absolute: 0 10*3/uL (ref 0.0–0.7)
Eosinophils Relative: 0 % (ref 0–5)
Lymphs Abs: 3.1 10*3/uL (ref 0.7–4.0)
Monocytes Absolute: 0.5 10*3/uL (ref 0.1–1.0)
Monocytes Relative: 7 % (ref 3–12)
Neutrophils Relative %: 50 % (ref 43–77)

## 2011-01-05 LAB — URINE CULTURE: Colony Count: >=100000

## 2011-01-05 LAB — POCT CARDIAC MARKERS
CKMB, poc: <1 ng/mL — ABNORMAL LOW (ref 1.0–8.0)
Myoglobin, poc: 113 ng/mL (ref 12–200)
Troponin i, poc: <0.05 ng/mL (ref 0.00–0.09)

## 2011-01-05 LAB — PROTIME-INR: INR: 1 (ref 0.00–1.49)

## 2011-01-05 LAB — CBC
Hemoglobin: 14.4 g/dL (ref 12.0–15.0)
MCHC: 33.4 g/dL (ref 30.0–36.0)
RBC: 4.82 MIL/uL (ref 3.87–5.11)
RDW: 13.1 % (ref 11.5–15.5)

## 2011-01-09 ENCOUNTER — Telehealth: Payer: Self-pay | Admitting: Family Medicine

## 2011-01-09 DIAGNOSIS — E785 Hyperlipidemia, unspecified: Secondary | ICD-10-CM

## 2011-01-09 DIAGNOSIS — K219 Gastro-esophageal reflux disease without esophagitis: Secondary | ICD-10-CM

## 2011-01-09 DIAGNOSIS — Z8719 Personal history of other diseases of the digestive system: Secondary | ICD-10-CM

## 2011-01-09 DIAGNOSIS — I10 Essential (primary) hypertension: Secondary | ICD-10-CM

## 2011-01-09 DIAGNOSIS — N259 Disorder resulting from impaired renal tubular function, unspecified: Secondary | ICD-10-CM

## 2011-01-09 DIAGNOSIS — M109 Gout, unspecified: Secondary | ICD-10-CM

## 2011-01-09 NOTE — Telephone Encounter (Signed)
Message copied by Roxy Manns on Fri Jan 09, 2011 11:33 AM ------      Message from: Mills Koller      Created: Thu Jan 08, 2011  4:10 PM       Patient is scheduled for CPX labs, please order future labs, Thanks , Camelia Eng

## 2011-01-12 ENCOUNTER — Telehealth: Payer: Self-pay | Admitting: *Deleted

## 2011-01-12 NOTE — Telephone Encounter (Signed)
Received faxed from [facility] stating that Triamterene/HCTZ 75/50mg  is unavailable will all generic manufactures.  Wanting alternative.  Form in your IN box, please advise.

## 2011-01-12 NOTE — Telephone Encounter (Signed)
She took 1/2 of prior  Will change to 1 of the 37.5/25 daily Let her know  Form in IN box Thanks

## 2011-01-14 ENCOUNTER — Other Ambulatory Visit (INDEPENDENT_AMBULATORY_CARE_PROVIDER_SITE_OTHER): Payer: 59 | Admitting: Family Medicine

## 2011-01-14 DIAGNOSIS — E785 Hyperlipidemia, unspecified: Secondary | ICD-10-CM

## 2011-01-14 DIAGNOSIS — K219 Gastro-esophageal reflux disease without esophagitis: Secondary | ICD-10-CM

## 2011-01-14 DIAGNOSIS — N259 Disorder resulting from impaired renal tubular function, unspecified: Secondary | ICD-10-CM

## 2011-01-14 DIAGNOSIS — M109 Gout, unspecified: Secondary | ICD-10-CM

## 2011-01-14 DIAGNOSIS — Z8719 Personal history of other diseases of the digestive system: Secondary | ICD-10-CM

## 2011-01-14 DIAGNOSIS — I10 Essential (primary) hypertension: Secondary | ICD-10-CM

## 2011-01-14 LAB — CBC WITH DIFFERENTIAL/PLATELET
Basophils Absolute: 0 10*3/uL (ref 0.0–0.1)
Eosinophils Absolute: 0.1 10*3/uL (ref 0.0–0.7)
HCT: 43.2 % (ref 36.0–46.0)
Lymphs Abs: 5.9 10*3/uL — ABNORMAL HIGH (ref 0.7–4.0)
MCV: 90.6 fl (ref 78.0–100.0)
Monocytes Absolute: 0.7 10*3/uL (ref 0.1–1.0)
Neutrophils Relative %: 36.2 % — ABNORMAL LOW (ref 43.0–77.0)
Platelets: 279 10*3/uL (ref 150.0–400.0)
RDW: 13.6 % (ref 11.5–14.6)

## 2011-01-14 LAB — TSH: TSH: 1.95 u[IU]/mL (ref 0.35–5.50)

## 2011-01-14 LAB — RENAL FUNCTION PANEL
Albumin: 4 g/dL (ref 3.5–5.2)
CO2: 32 mEq/L (ref 19–32)
Calcium: 9.9 mg/dL (ref 8.4–10.5)
Creatinine, Ser: 1.4 mg/dL — ABNORMAL HIGH (ref 0.4–1.2)
Potassium: 4.3 mEq/L (ref 3.5–5.1)
Sodium: 143 mEq/L (ref 135–145)

## 2011-01-14 LAB — HEPATIC FUNCTION PANEL
ALT: 17 U/L (ref 0–35)
AST: 22 U/L (ref 0–37)
Alkaline Phosphatase: 55 U/L (ref 39–117)
Total Bilirubin: 0.9 mg/dL (ref 0.3–1.2)

## 2011-01-14 NOTE — Telephone Encounter (Signed)
Linda Robinson had faxed to [facility]. I spoke with Angelique Blonder at [facility] [phone] and she confirmed fax had been received and med was sent out today to pt. Patient notified as instructed by telephone.

## 2011-01-15 LAB — VITAMIN D 25 HYDROXY (VIT D DEFICIENCY, FRACTURES): Vit D, 25-Hydroxy: 44 ng/mL (ref 30–89)

## 2011-01-23 ENCOUNTER — Ambulatory Visit (INDEPENDENT_AMBULATORY_CARE_PROVIDER_SITE_OTHER): Payer: 59 | Admitting: Family Medicine

## 2011-01-23 ENCOUNTER — Encounter: Payer: Self-pay | Admitting: Family Medicine

## 2011-01-23 DIAGNOSIS — N259 Disorder resulting from impaired renal tubular function, unspecified: Secondary | ICD-10-CM

## 2011-01-23 DIAGNOSIS — M949 Disorder of cartilage, unspecified: Secondary | ICD-10-CM

## 2011-01-23 DIAGNOSIS — E785 Hyperlipidemia, unspecified: Secondary | ICD-10-CM

## 2011-01-23 DIAGNOSIS — I10 Essential (primary) hypertension: Secondary | ICD-10-CM

## 2011-01-23 DIAGNOSIS — M109 Gout, unspecified: Secondary | ICD-10-CM

## 2011-01-23 NOTE — Assessment & Plan Note (Signed)
Good vit D level- will continue current dose

## 2011-01-23 NOTE — Assessment & Plan Note (Signed)
Pt is able to take 1/2 pravachol some days and 1 on others  Tolerating better Rev low sat fat diet  Will plan f/u after next labs

## 2011-01-23 NOTE — Patient Instructions (Signed)
Push the water intake -- for your kidney health  Aim for 10 glasses of water per day  Schedule non fasting labs in 2 weeks  Keep working on taking cholesterol medicine however you tolerate it  Watch diet for fats and salt and sugar

## 2011-01-23 NOTE — Assessment & Plan Note (Signed)
Good control of uric acid on allopruinol with no flares  No changes No nsaids at this time at all  Will continue to follow

## 2011-01-23 NOTE — Assessment & Plan Note (Signed)
This is worse- no doubt related to recent trip and dec fluid intake Edema is gone now Disc aim for 10 glasses of water per day-this worked in past Re check 2 wk  If not imp- consider change maxzide/ more w/u for renal dz

## 2011-01-23 NOTE — Assessment & Plan Note (Signed)
Fairly controlled  May need to change maxzide if her cr remains elevated in 2 wk Disc low sodium diet with bigger water intake

## 2011-01-23 NOTE — Progress Notes (Signed)
Subjective:    Patient ID: Linda Robinson, female    DOB: 02-Aug-1929, 77 y.o.   MRN: [id number]  HPI Here for f/u of gout and HTN an renal insuff and osteopenia   Wt is u 4 lb with high bmi of 34  Gout - uric acid good at 6.3 on allopurinol No indocin currently No gout attacks at all   Osteopenia - checked vitD is 44 on current dose  No recent fractures Intol of bisphoshenate Is careful   Renal insuff worse at 1.4 Not enough water -- this may be the cause  No change in urination No dysuria or frequency or blood   Is taking biotin for fingernails --? If helping   HTn - bp is 136/70 No headache or cp  Some ankle edema when she went to the beach Could have eaten more salt  ? If drank enough water -- less lately   Past Medical History  Diagnosis Date  . History of diverticulitis of colon   . Diverticulosis of colon   . Gout   . HLD (hyperlipidemia)   . HTN (hypertension)   . OA (osteoarthritis)   . Osteopenia   . GERD (gastroesophageal reflux disease)     atypical chest pain  . Basal cell carcinoma     removed   Past Surgical History  Procedure Date  . Appendectomy   . Cataract extraction 04/2006  . Cholecystectomy   . Partial colectomy 09/1999  . Back surgery   . Neck surgery   . Knee surgery     Medial meniscus tear    reports that she has never smoked. She does not have any smokeless tobacco history on file. Her alcohol and drug histories not on file. family history includes Alzheimer's disease in her mother; Cancer in her father; Diabetes in her father; Heart attack in her father; Hypertension in her father and mother; and Stroke in her mother. Allergies  Allergen Reactions  . Ace Inhibitors     REACTION: increased K+, increased creat.  . Cephalosporins     REACTION: reaction not known  . Colesevelam     REACTION: constipation  . Lovastatin     REACTION: doesn't work  . Raloxifene     REACTION: cramps  . Rosuvastatin     REACTION: myalgia  .  Simvastatin     REACTION: myalgia       Review of Systems ROS Review of Systems  Constitutional: Negative for fever, appetite change, fatigue and unexpected weight change.  Eyes: Negative for pain and visual disturbance.  Respiratory: Negative for cough and shortness of breath.   Cardiovascular: Negative.   Gastrointestinal: Negative for nausea, diarrhea and constipation.  Genitourinary: Negative for urgency and frequency.  Skin: Negative for pallor.  Neurological: Negative for weakness, light-headedness, numbness and headaches.  Hematological: Negative for adenopathy. Does not bruise/bleed easily.  Psychiatric/Behavioral: Negative for dysphoric mood. The patient is not nervous/anxious.         Objective:   Physical Exam  Constitutional: She appears well-developed and well-nourished.       overwt and well appearing    HENT:  Head: Normocephalic and atraumatic.  Mouth/Throat: Oropharynx is clear and moist.  Eyes: Conjunctivae and EOM are normal. Pupils are equal, round, and reactive to light.  Neck: Normal range of motion. Neck supple. Normal carotid pulses and no JVD present. No thyromegaly present.  Cardiovascular: Normal rate, regular rhythm and normal heart sounds.   Pulmonary/Chest: Effort normal and breath sounds normal.  She has no wheezes.  Abdominal: Soft. Bowel sounds are normal. She exhibits no abdominal bruit and no mass. There is no tenderness.  Musculoskeletal: She exhibits no edema and no tenderness.  Lymphadenopathy:    She has no cervical adenopathy.  Neurological: She is alert. She has normal reflexes. Coordination normal.  Skin: Skin is warm and dry. No rash noted. No erythema. No pallor.  Psychiatric: She has a normal mood and affect.          Assessment & Plan:

## 2011-02-06 ENCOUNTER — Other Ambulatory Visit (INDEPENDENT_AMBULATORY_CARE_PROVIDER_SITE_OTHER): Payer: 59 | Admitting: Family Medicine

## 2011-02-06 DIAGNOSIS — N259 Disorder resulting from impaired renal tubular function, unspecified: Secondary | ICD-10-CM

## 2011-02-06 LAB — RENAL FUNCTION PANEL
Albumin: 4 g/dL (ref 3.5–5.2)
BUN: 18 mg/dL (ref 6–23)
Chloride: 101 mEq/L (ref 96–112)
Creatinine, Ser: 1.1 mg/dL (ref 0.4–1.2)
Glucose, Bld: 94 mg/dL (ref 70–99)
Phosphorus: 3.2 mg/dL (ref 2.3–4.6)

## 2011-02-12 ENCOUNTER — Telehealth: Payer: Self-pay

## 2011-02-12 NOTE — Telephone Encounter (Signed)
Message copied by Lewanda Rife on Thu Feb 12, 2011  5:35 PM ------      Message from: Roxy Manns      Created: Sun Feb 08, 2011  6:32 PM       Kidney number is better       Keep up the good water intake      Schedule fasting lab then f/u in 52mo lipid/ast/alt/renal 272 and renal insuff      thanks

## 2011-02-12 NOTE — Telephone Encounter (Signed)
Patient notified as instructed by telephone. Fasting Lab appt scheduled as instructed 08/04/11 at 9am with f/u appt with Dr Milinda Antis 08/11/11 at 9:15.

## 2011-02-13 NOTE — Op Note (Signed)
NAME:  Linda Robinson, Linda Robinson                         ACCOUNT NO.:  [id number]   MEDICAL RECORD NO.:  [id number]                   PATIENT TYPE:  INP   LOCATION:  2899                                 FACILITY:  [facility]   PHYSICIAN:  Sharolyn Douglas, M.D.                     DATE OF BIRTH:  1929-06-02   DATE OF PROCEDURE:  11/22/2003  DATE OF DISCHARGE:  11/24/2003                                 OPERATIVE REPORT   PREOPERATIVE DIAGNOSES:  1. Cervical spondylotic radiculopathy.  2. Status post anterior cervical diskectomy and fusion, C4-5, C5-6 with     adjacent segment disease.   POSTOPERATIVE DIAGNOSES:  1. Cervical spondylotic radiculopathy.  2. Status post anterior cervical diskectomy and fusion, C4-5, C5-6 with     adjacent segment disease.   OPERATION/PROCEDURE:  1. Exploration of C4-5, C5-6 cervical fusion.  2. Anterior cervical diskectomy, C6-7.  3. Anterior cervical arthrodesis, C6-7, with placement of an 8 mm invasive     allograft prosthesis spacer packed with local autogenous bone graft.  4. Anterior cervical plating, C6-7, utilizing the spinal Concept system.   SURGEON:  Sharolyn Douglas, M.D.   ASSISTANTJill Side Mahar, P.A.-C.   ANESTHESIA:  General endotracheal anesthesia.   COMPLICATIONS:  None.   INDICATIONS:  The patient is a 77 year old female with progressively  worsening history of neck and left upper extremity pain.  She is status post  C4-5, C5-6 anterior cervical fusion 20 years ago uninstrumented.  Plain  radiographs, MRI scan demonstrated advanced degenerative changes at the  adjacent segments.  Because of her persistent symptoms, she has elected to  undergo exploration of her fusion and anterior cervical diskectomy at C6-7.  I discussed with her the possibility of extending the fusion across the C7-  T1 anteriorly if the exposure could be obtained.  Intraoperatively, we were  not able to access the C7-T1 level.  The primary pain generator was felt to  be at C6-7  and, therefore, the surgery was limited to that level.  Risks,  benefits and alternatives were discussed with the patient and she elected to  proceed.   DESCRIPTION OF PROCEDURE:  The patient was properly identified in the  holding area and taken to the operating room.  She underwent general  endotracheal anesthesia without difficulty.  She was given prophylactic IV  antibiotics.  She was carefully positioned on the operating room table with  her neck in slight extension.  Five pounds of holter traction applied.  Neck  prepped and draped in the usual sterile fashion.   The previous left-sided transverse incision was utilized.  Dissection was  carried through the previous scar down to the sternocleidomastoid.  The  interval between SCM and strap muscles medially was developed down to the  prevertebral space.  The C6-7 level was easily identified by the large  anterior osteophytes.  Spinal needle was placed and intraoperative x-ray  confirmed this location.  Longus coli muscle elevated out over the C6-7 disk  space as well as over the previous fusion mass.  Deep retractors were  placed.  The esophagus, trachea, carotid sheath were identified, protected  at all times.  Leksell rongeur was used to remove the anterior osteophytes.  The disk space was severely degenerative.  There was very little disk  material.  High-speed bur was used to remove the cartilaginous end plates  and take down the large hypertrophied __________ vertebral joint.  Wide  foraminotomies were performed bilaterally.  The posterior vertebral margins  were undercut using a 2 mm Kerrison.  Bleeding was controlled with bipolar  electrocautery and Gelfoam.  Caspar distraction pins were placed in the C6  and C7 vertebral bodies.  Gentle distraction was applied.  An 8 mm invasive  allograft prosthesis space was then packed with local autologous bone graft,  collected from the bur shavings.  This was carefully tamped 1 mm onto  the  disk space.  We then removed the Caspar distraction pins and the weight from  the patient's head.  A 26 mm spinal Concept plate was then placed with four  14 mm locking screws.  We had excellent screw purchase.  We insured the  locking mechanism engage.  The C7-T1 level was evaluated.  There were  minimal degenerative changes noted anteriorly over the disk.  It did not  appear that there was adequate exposure to perform diskectomy and fusion at  this level.  It was felt that the primary pain generator was at C6-7 and,  therefore, we elected not to proceed with surgery at C7-T1.  I had discussed  this with the patient and if she does remain persistently symptomatic, we  will consider a posterior approach.  The wound was then closed in layers.  A  Penrose drain was left in place.  The esophagus, trachea, and carotid sheath  were examined.  There were no apparent injuries.  Platysma closed with  interrupted 2-0 Vicryl, subcutaneous layer closed with 3-0 Vicryl followed  by running 4-0 subcuticular suture on the skin.  Benzoin and Steri-Strips  applied.  Aspen cervical collar placed.  The patient was extubated without  difficulty and transferred to the recovery room in stable condition, able to  move her upper and lower extremities.                                               Sharolyn Douglas, M.D.    MC/MEDQ  D:  11/22/2003  T:  11/22/2003  Job:  [id number]

## 2011-02-18 ENCOUNTER — Other Ambulatory Visit: Payer: Self-pay | Admitting: Family Medicine

## 2011-02-19 NOTE — Telephone Encounter (Signed)
Left message for Linda Robinson to call back re: refill request. Unable to find medication listed on med list. Unable to reach pt by phone.

## 2011-02-20 NOTE — Telephone Encounter (Signed)
Pt said Fosamax was stopped last Sept for awhile and then pt started to take it again.  Pt has been taking Fosamax weekly for several months. Please advise. Pt still has medication for a few weeks so pt said response next week would be fine.

## 2011-02-23 NOTE — Telephone Encounter (Signed)
Px written for call in   

## 2011-02-24 NOTE — Telephone Encounter (Signed)
Rx called in as directed to [facility].

## 2011-03-05 ENCOUNTER — Other Ambulatory Visit: Payer: Self-pay | Admitting: Family Medicine

## 2011-03-05 NOTE — Telephone Encounter (Signed)
Will elect send meds to [facility]

## 2011-03-26 ENCOUNTER — Other Ambulatory Visit: Payer: Self-pay | Admitting: Family Medicine

## 2011-06-05 ENCOUNTER — Other Ambulatory Visit: Payer: Self-pay | Admitting: Family Medicine

## 2011-06-18 ENCOUNTER — Other Ambulatory Visit: Payer: Self-pay | Admitting: Family Medicine

## 2011-06-18 NOTE — Telephone Encounter (Signed)
[facility] electronically request refill on Buspar 15 mg last refilled 03/12/11.Please advise.

## 2011-06-18 NOTE — Telephone Encounter (Signed)
Will refill electronically  

## 2011-06-26 ENCOUNTER — Ambulatory Visit (INDEPENDENT_AMBULATORY_CARE_PROVIDER_SITE_OTHER): Payer: 59 | Admitting: Family Medicine

## 2011-06-26 ENCOUNTER — Encounter: Payer: Self-pay | Admitting: Family Medicine

## 2011-06-26 VITALS — BP 124/78 | HR 68 | Temp 98.6°F | Wt 181.5 lb

## 2011-06-26 DIAGNOSIS — J329 Chronic sinusitis, unspecified: Secondary | ICD-10-CM | POA: Insufficient documentation

## 2011-06-26 MED ORDER — AMOXICILLIN-POT CLAVULANATE 875-125 MG PO TABS: 1.0000 | ORAL_TABLET | Freq: Two times a day (BID) | ORAL | Status: DC

## 2011-06-26 MED ORDER — DOXYCYCLINE HYCLATE 100 MG PO CAPS: 100.0000 mg | ORAL_CAPSULE | Freq: Two times a day (BID) | ORAL | Status: DC

## 2011-06-26 NOTE — Patient Instructions (Addendum)
You have a sinus infection. Take medicine as prescribed:  Doxycycline twice daily for 10 days. Push fluids and plenty of rest. Nasal saline irrigation or neti pot to help drain sinuses. May use simple mucinex with plenty of fluid to help mobilize mucous. Return if fever >101.5, trouble opening/closing mouth, difficulty swallowing, or worsening. Update [place] if not improved after antibiotics.

## 2011-06-26 NOTE — Assessment & Plan Note (Addendum)
After recent viral URTI, but now worsened. Cover with doxyccyline.  Update if not imrpoved. mucinex and nasal saline irrigation recommended as well.  Initially sent augmentin - however given allergy to cephalosporin in chart (pt unsure what reaction was, but thinks may have been swelling), changed to doxy

## 2011-06-26 NOTE — Progress Notes (Signed)
  Subjective:    Patient ID: Linda Robinson, female    DOB: 1928-12-02, 77 y.o.   MRN: [id number]  HPI CC: "something in my head"  1 wk ago had cough, cold.  Started improving, then Wednesday started feeling worse.  R throat pain, pain going into ear when coughing or swallowing.  + productive cough of thick yellow sputum.  Some abd pain and constipation and nausea.  Nasal congestion.  Took some medicine from drug store 24hour allergy med.  No fevers/chills, abdominal pain, v/d, rash.    No sick contacts at home.  No smokers at home.  No h/o asthma or COPD.  Review of Systems Per HPI    Objective:   Physical Exam  Nursing note and vitals reviewed. Constitutional: She appears well-developed and well-nourished. No distress.       With cough  HENT:  Head: Normocephalic and atraumatic.  Right Ear: External ear normal.  Left Ear: External ear normal.  Nose: No mucosal edema or rhinorrhea. Right sinus exhibits no maxillary sinus tenderness and no frontal sinus tenderness. Left sinus exhibits no maxillary sinus tenderness and no frontal sinus tenderness.  Mouth/Throat: Uvula is midline, oropharynx is clear and moist and mucous membranes are normal. No oropharyngeal exudate.  Eyes: Conjunctivae and EOM are normal. Pupils are equal, round, and reactive to light. No scleral icterus.  Neck: Normal range of motion. Neck supple. No JVD present. No thyromegaly present.  Cardiovascular: Normal rate, regular rhythm, normal heart sounds and intact distal pulses.   No murmur heard. Pulmonary/Chest: Effort normal and breath sounds normal. No respiratory distress. She has no wheezes. She has no rales.  Lymphadenopathy:    She has no cervical adenopathy.  Skin: Skin is warm and dry. No rash noted.  Psychiatric: She has a normal mood and affect.          Assessment & Plan:

## 2011-06-30 ENCOUNTER — Telehealth: Payer: Self-pay | Admitting: *Deleted

## 2011-06-30 NOTE — Telephone Encounter (Signed)
Addended by: Eustaquio Boyden on: 06/30/2011 06:19 PM   Modules accepted: Orders, Medications

## 2011-06-30 NOTE — Telephone Encounter (Signed)
rec stop doxycycline.  Took 5 days.  Sinus congestion better, ST better.   Sleeping ok but does have cough.  Getting mucous out. rec against cough syrup. Update [place] if worsening cough, or fever >101 for likely abx course (levaquin).

## 2011-06-30 NOTE — Telephone Encounter (Signed)
Patient saw you last week and was give Doxycycline for a sinus infection and is having a problem taking it. Patient states that she has been taking it with food. Patient states that she is nauseated, weak, dizzy and having stomach pain. Patient states that her sore throat is better, but still has the cough. Please advise.  Pharmacy: [facility]

## 2011-07-23 ENCOUNTER — Ambulatory Visit (INDEPENDENT_AMBULATORY_CARE_PROVIDER_SITE_OTHER): Payer: 59

## 2011-07-23 DIAGNOSIS — Z23 Encounter for immunization: Secondary | ICD-10-CM

## 2011-07-29 ENCOUNTER — Ambulatory Visit: Payer: 59 | Admitting: Family Medicine

## 2011-07-31 ENCOUNTER — Other Ambulatory Visit: Payer: Self-pay | Admitting: Family Medicine

## 2011-07-31 DIAGNOSIS — E785 Hyperlipidemia, unspecified: Secondary | ICD-10-CM

## 2011-07-31 DIAGNOSIS — N259 Disorder resulting from impaired renal tubular function, unspecified: Secondary | ICD-10-CM

## 2011-08-04 ENCOUNTER — Other Ambulatory Visit (INDEPENDENT_AMBULATORY_CARE_PROVIDER_SITE_OTHER): Payer: 59

## 2011-08-04 DIAGNOSIS — E785 Hyperlipidemia, unspecified: Secondary | ICD-10-CM

## 2011-08-04 DIAGNOSIS — N259 Disorder resulting from impaired renal tubular function, unspecified: Secondary | ICD-10-CM

## 2011-08-04 LAB — ALT: ALT: 17 U/L (ref 0–35)

## 2011-08-04 LAB — LIPID PANEL
Cholesterol: 199 mg/dL (ref 0–200)
LDL Cholesterol: 112 mg/dL — ABNORMAL HIGH (ref 0–99)
Triglycerides: 140 mg/dL (ref 0.0–149.0)

## 2011-08-04 LAB — RENAL FUNCTION PANEL
Calcium: 9.4 mg/dL (ref 8.4–10.5)
Creatinine, Ser: 1.2 mg/dL (ref 0.4–1.2)
Glucose, Bld: 101 mg/dL — ABNORMAL HIGH (ref 70–99)
Phosphorus: 3.5 mg/dL (ref 2.3–4.6)
Sodium: 140 mEq/L (ref 135–145)

## 2011-08-11 ENCOUNTER — Encounter: Payer: Self-pay | Admitting: Family Medicine

## 2011-08-11 ENCOUNTER — Ambulatory Visit (INDEPENDENT_AMBULATORY_CARE_PROVIDER_SITE_OTHER): Payer: 59 | Admitting: Family Medicine

## 2011-08-11 VITALS — BP 140/68 | HR 68 | Temp 97.6°F | Ht 62.0 in | Wt 182.5 lb

## 2011-08-11 DIAGNOSIS — I10 Essential (primary) hypertension: Secondary | ICD-10-CM

## 2011-08-11 DIAGNOSIS — E785 Hyperlipidemia, unspecified: Secondary | ICD-10-CM

## 2011-08-11 DIAGNOSIS — N259 Disorder resulting from impaired renal tubular function, unspecified: Secondary | ICD-10-CM

## 2011-08-11 NOTE — Patient Instructions (Signed)
I'm glad you are doing well  Work on exercise- gradually increase to 20-30 minutes 5 days a week as a goal  Also watch sugar and fats in diet  Cholesterol looks good  Weight loss would help all of your medical problems - including joint pain  Follow up in 6 months for 30 minute check up with labs prior  We will give you a pneumonia vaccine next time

## 2011-08-11 NOTE — Progress Notes (Signed)
Subjective:    Patient ID: Linda Robinson, female    DOB: October 01, 1928, 77 y.o.   MRN: [id number]  HPI Here for f/u of chronic medical problems  Nothing new medically   HTN - bp is 140/68-- forgot to take her med before visit today Usually does well with it  No cp or ha or palpitations   Wt is stable with bmi of 33 Have disc plan for wt loss in past  Is getting some exercise - stationary bike - ? How many minutes    Lipids are improved overall On pravachol as tolerated and diet Lab Results  Component Value Date   CHOL 199 08/04/2011   CHOL 230* 10/30/2010   CHOL 236* 07/23/2010   Lab Results  Component Value Date   HDL 58.70 08/04/2011   HDL 84.13 10/30/2010   HDL 56.20 07/23/2010   Lab Results  Component Value Date   LDLCALC 112* 08/04/2011   LDLCALC 210* 07/12/2007   Lab Results  Component Value Date   TRIG 140.0 08/04/2011   TRIG 169.0* 10/30/2010   TRIG 234.0* 07/23/2010   Lab Results  Component Value Date   CHOLHDL 3 08/04/2011   CHOLHDL 4 10/30/2010   CHOLHDL 4 07/23/2010   Lab Results  Component Value Date   LDLDIRECT 150.8 10/30/2010   LDLDIRECT 136.6 07/23/2010   LDLDIRECT 145.6 08/30/2009   is eating better and tolerates pravachol now - 1 pill daily  Gluc 101- that is stable   Renal insuff is stable with cr 1.2 - holding steady Disc imp of good fluid intake and aware pt is on maxzide  Is drinking lots of water   Patient Active Problem List  Diagnoses  . HYPERLIPIDEMIA  . GOUT  . ANXIETY STATE NEC  . REACTION, ACUTE STRESS W/EMOTIONAL DSTURB  . DISORDER, DEPRESSIVE NEC  . HYPERTENSION  . BRADYCARDIA  . DIVERTICULOSIS, COLON  . CONSTIPATION  . RENAL INSUFFICIENCY  . CYSTITIS, HEMORRHAGIC  . CYSTITIS CYSTICA  . OSTEOARTHRITIS  . DEGENERATIVE DISC DISEASE  . LEG CRAMPS  . OSTEOPENIA   Past Medical History  Diagnosis Date  . History of diverticulitis of colon   . Diverticulosis of colon   . Gout   . HLD (hyperlipidemia)   . HTN (hypertension)     . OA (osteoarthritis)   . Osteopenia   . GERD (gastroesophageal reflux disease)     atypical chest pain  . Basal cell carcinoma     removed   Past Surgical History  Procedure Date  . Appendectomy   . Cataract extraction 04/2006  . Cholecystectomy   . Partial colectomy 09/1999  . Back surgery   . Neck surgery   . Knee surgery     Medial meniscus tear   History  Substance Use Topics  . Smoking status: Never Smoker   . Smokeless tobacco: Not on file  . Alcohol Use: Not on file   Family History  Problem Relation Age of Onset  . Cancer Father     throat and bladder  . Diabetes Father   . Hypertension Father   . Heart attack Father   . Stroke Mother   . Alzheimer's disease Mother   . Hypertension Mother    Allergies  Allergen Reactions  . Ace Inhibitors     REACTION: increased K+, increased creat.  . Cephalosporins     REACTION: reaction not known  . Colesevelam     REACTION: constipation  . Lovastatin     REACTION:  doesn't work  . Raloxifene     REACTION: cramps  . Rosuvastatin     REACTION: myalgia  . Simvastatin     REACTION: myalgia   Current Outpatient Prescriptions on File Prior to Visit  Medication Sig Dispense Refill  . alendronate (FOSAMAX) 70 MG tablet TAKE 1 TABLET EVERY WEEK AS DIRECTED  12 tablet  1  . allopurinol (ZYLOPRIM) 100 MG tablet TAKE 1 TABLET DAILY  90 tablet  3  . amLODipine (NORVASC) 5 MG tablet TAKE 1 TABLET DAILY IN THE MORNING  90 tablet  3  . busPIRone (BUSPAR) 15 MG tablet TAKE ONE-HALF (1/2) TABLET TWICE A DAY  90 tablet  3  . Cranberry 250 MG CAPS Take 1 capsule by mouth daily.        Marland Kitchen docusate sodium (COLACE) 100 MG capsule Take 200 mg by mouth daily.        . fish oil-omega-3 fatty acids 1000 MG capsule Take 1 g by mouth 2 (two) times daily.       . metoprolol (TOPROL-XL) 50 MG 24 hr tablet TAKE 1 TABLET TWICE A DAY  180 tablet  2  . polycarbophil (FIBERCON) 625 MG tablet Take 625 mg by mouth daily as needed.        .  pravastatin (PRAVACHOL) 20 MG tablet Take 0.5-1 tablets (10-20 mg total) by mouth daily.  90 tablet  1  . triamterene-hydrochlorothiazide (MAXZIDE) 75-50 MG per tablet Take 0.5 tablets by mouth daily.        . meclizine (ANTIVERT) 25 MG tablet Take 25 mg by mouth 4 (four) times daily as needed.             Review of Systems Review of Systems  Constitutional: Negative for fever, appetite change, fatigue and unexpected weight change.  Eyes: Negative for pain and visual disturbance.  Respiratory: Negative for cough and shortness of breath.   Cardiovascular: Negative for cp or palpitations    Gastrointestinal: Negative for nausea, diarrhea and constipation.  Genitourinary: Negative for urgency and frequency.  Skin: Negative for pallor or rash   Neurological: Negative for weakness, light-headedness, numbness and headaches.  Hematological: Negative for adenopathy. Does not bruise/bleed easily.  Psychiatric/Behavioral: Negative for dysphoric mood. The patient is not nervous/anxious.          Objective:   Physical Exam  Constitutional: She appears well-developed and well-nourished. No distress.       overwt and well appearing   HENT:  Head: Normocephalic and atraumatic.  Mouth/Throat: Oropharynx is clear and moist.  Eyes: Conjunctivae and EOM are normal. Pupils are equal, round, and reactive to light.  Neck: Normal range of motion. Neck supple. No JVD present. Carotid bruit is not present. No thyromegaly present.  Cardiovascular: Normal rate, regular rhythm, normal heart sounds and intact distal pulses.  Exam reveals no gallop.   Pulmonary/Chest: Effort normal and breath sounds normal. No respiratory distress. She has no wheezes.  Abdominal: Soft. Bowel sounds are normal. She exhibits no distension, no abdominal bruit and no mass. There is no tenderness.  Musculoskeletal: She exhibits no edema and no tenderness.  Lymphadenopathy:    She has no cervical adenopathy.  Neurological: She is  alert. No cranial nerve deficit. Coordination normal.  Skin: Skin is warm and dry. No rash noted. No erythema. No pallor.  Psychiatric: She has a normal mood and affect.          Assessment & Plan:

## 2011-08-11 NOTE — Assessment & Plan Note (Signed)
Improved now that she tolerates 1 pravachol daily  Also better diet Disc goals for lipids and reasons to control them Rev labs with pt Rev low sat fat diet in detail  Continue current med F/u 6 mo

## 2011-08-11 NOTE — Assessment & Plan Note (Signed)
This is stable with cr of 1.2  Doing well drinking more water Continue to watch in light of maxzide and age  Disc avoid nsaids

## 2011-08-11 NOTE — Assessment & Plan Note (Signed)
bp up a bit due to not taking meds today  Is good at home bp in fair control at this time  No changes needed  Disc lifstyle change with low sodium diet and exercise   Will continue to monitor Disc goal of exercise on bike 5 d per week- inc gradually

## 2011-08-26 ENCOUNTER — Other Ambulatory Visit: Payer: Self-pay | Admitting: Family Medicine

## 2011-08-30 ENCOUNTER — Other Ambulatory Visit: Payer: Self-pay | Admitting: Family Medicine

## 2011-09-01 NOTE — Telephone Encounter (Signed)
[facility] request refill Pravastatin 20 mg #90 x 3.

## 2011-11-12 ENCOUNTER — Encounter: Payer: Self-pay | Admitting: Family Medicine

## 2011-11-19 ENCOUNTER — Encounter: Payer: Self-pay | Admitting: Family Medicine

## 2011-12-22 ENCOUNTER — Ambulatory Visit: Payer: 59

## 2011-12-22 ENCOUNTER — Ambulatory Visit (INDEPENDENT_AMBULATORY_CARE_PROVIDER_SITE_OTHER): Payer: 59 | Admitting: Family Medicine

## 2011-12-22 ENCOUNTER — Encounter: Payer: Self-pay | Admitting: Family Medicine

## 2011-12-22 VITALS — BP 142/68 | HR 52 | Temp 98.0°F | Ht 62.0 in | Wt 180.0 lb

## 2011-12-22 DIAGNOSIS — D72829 Elevated white blood cell count, unspecified: Secondary | ICD-10-CM

## 2011-12-22 DIAGNOSIS — K5289 Other specified noninfective gastroenteritis and colitis: Secondary | ICD-10-CM

## 2011-12-22 DIAGNOSIS — K529 Noninfective gastroenteritis and colitis, unspecified: Secondary | ICD-10-CM | POA: Insufficient documentation

## 2011-12-22 LAB — CBC WITH DIFFERENTIAL/PLATELET
Basophils Relative: 0.4 % (ref 0.0–3.0)
Eosinophils Absolute: 0 10*3/uL (ref 0.0–0.7)
MCHC: 32.1 g/dL (ref 30.0–36.0)
MCV: 90.8 fl (ref 78.0–100.0)
Monocytes Absolute: 0.7 10*3/uL (ref 0.1–1.0)
Neutro Abs: 4.4 10*3/uL (ref 1.4–7.7)
Neutrophils Relative %: 35.8 % — ABNORMAL LOW (ref 43.0–77.0)
RBC: 5.22 Mil/uL — ABNORMAL HIGH (ref 3.87–5.11)

## 2011-12-22 LAB — BASIC METABOLIC PANEL
BUN: 19 mg/dL (ref 6–23)
Creatinine, Ser: 1.3 mg/dL — ABNORMAL HIGH (ref 0.4–1.2)
GFR: 40.71 mL/min — ABNORMAL LOW (ref >60.00)
Potassium: 4.2 mEq/L (ref 3.5–5.1)

## 2011-12-22 NOTE — Progress Notes (Signed)
Subjective:    Patient ID: Linda Robinson, female    DOB: 1929-09-25, 78 y.o.   MRN: [id number]  HPI Here for f/u after gastroenteritis- suspect norovirus  Started Monday with GI upset and by Tuesday  Nauseated but never could vomit Had bad diarrhea - stools are starting to form now   Feels generally weak and still nauseated  Just does not feel 100% Took care of grandkids on Saturday   No blood in stool No fever  Was a bit achey and some hot and cold   Has hx of diverticulitis -does not feel like a flare Low abd is bloated - not in pain, however  Knows symptoms to look for   Patient Active Problem List  Diagnoses  . HYPERLIPIDEMIA  . GOUT  . ANXIETY STATE NEC  . REACTION, ACUTE STRESS W/EMOTIONAL DSTURB  . DISORDER, DEPRESSIVE NEC  . HYPERTENSION  . BRADYCARDIA  . DIVERTICULOSIS, COLON  . CONSTIPATION  . RENAL INSUFFICIENCY  . CYSTITIS, HEMORRHAGIC  . CYSTITIS CYSTICA  . OSTEOARTHRITIS  . DEGENERATIVE DISC DISEASE  . LEG CRAMPS  . OSTEOPENIA  . Gastroenteritis, acute   Past Medical History  Diagnosis Date  . History of diverticulitis of colon   . Diverticulosis of colon   . Gout   . HLD (hyperlipidemia)   . HTN (hypertension)   . OA (osteoarthritis)   . Osteopenia   . GERD (gastroesophageal reflux disease)     atypical chest pain  . Basal cell carcinoma     removed   Past Surgical History  Procedure Date  . Appendectomy   . Cataract extraction 04/2006  . Cholecystectomy   . Partial colectomy 09/1999  . Back surgery   . Neck surgery   . Knee surgery     Medial meniscus tear   History  Substance Use Topics  . Smoking status: Never Smoker   . Smokeless tobacco: Not on file  . Alcohol Use: Not on file   Family History  Problem Relation Age of Onset  . Cancer Father     throat and bladder  . Diabetes Father   . Hypertension Father   . Heart attack Father   . Stroke Mother   . Alzheimer's disease Mother   . Hypertension Mother    Allergies    Allergen Reactions  . Ace Inhibitors     REACTION: increased K+, increased creat.  . Cephalosporins     REACTION: reaction not known  . Colesevelam     REACTION: constipation  . Lovastatin     REACTION: doesn't work  . Raloxifene     REACTION: cramps  . Rosuvastatin     REACTION: myalgia  . Simvastatin     REACTION: myalgia   Current Outpatient Prescriptions on File Prior to Visit  Medication Sig Dispense Refill  . alendronate (FOSAMAX) 70 MG tablet TAKE 1 TABLET EVERY WEEK AS DIRECTED  12 tablet  3  . allopurinol (ZYLOPRIM) 100 MG tablet TAKE 1 TABLET DAILY  90 tablet  3  . amLODipine (NORVASC) 5 MG tablet TAKE 1 TABLET DAILY IN THE MORNING  90 tablet  3  . busPIRone (BUSPAR) 15 MG tablet TAKE ONE-HALF (1/2) TABLET TWICE A DAY  90 tablet  3  . Cranberry 250 MG CAPS Take 1 capsule by mouth daily.        Marland Kitchen docusate sodium (COLACE) 100 MG capsule Take 200 mg by mouth daily.        . fish oil-omega-3 fatty  acids 1000 MG capsule Take 1 g by mouth 2 (two) times daily.       . metoprolol (TOPROL-XL) 50 MG 24 hr tablet TAKE 1 TABLET TWICE A DAY  180 tablet  2  . polycarbophil (FIBERCON) 625 MG tablet Take 625 mg by mouth daily as needed.        . pravastatin (PRAVACHOL) 20 MG tablet TAKE ONE-HALF (1/2) TO 1 TABLET (10 - 20 MG TOTAL) DAILY  90 tablet  3  . triamterene-hydrochlorothiazide (MAXZIDE) 75-50 MG per tablet Take 0.5 tablets by mouth daily.        . meclizine (ANTIVERT) 25 MG tablet Take 25 mg by mouth 4 (four) times daily as needed.           Review of Systems Review of Systems  Constitutional: Negative for fever,  fatigue and unexpected weight change. pos for loss of appetite  Eyes: Negative for pain and visual disturbance.  ENT neg for st or ear pain  Respiratory: Negative for cough and shortness of breath.   Cardiovascular: Negative for cp or palpitations    Gastrointestinal: Negative for constipation/ abd pain/ blood in stool, pos for nausea and diarrhes Genitourinary:  Negative for urgency and frequency.  Skin: Negative for pallor or rash   Neurological: Negative for weakness, light-headedness, numbness and headaches.  Hematological: Negative for adenopathy. Does not bruise/bleed easily.  Psychiatric/Behavioral: Negative for dysphoric mood. The patient is not nervous/anxious.          Objective:   Physical Exam  Constitutional: She appears well-developed and well-nourished. No distress.  HENT:  Head: Normocephalic and atraumatic.  Mouth/Throat: Oropharynx is clear and moist. No oropharyngeal exudate.  Eyes: Conjunctivae and EOM are normal. Pupils are equal, round, and reactive to light. No scleral icterus.  Neck: Normal range of motion. Neck supple.  Cardiovascular: Normal rate, regular rhythm and normal heart sounds.   Pulmonary/Chest: Effort normal and breath sounds normal. No respiratory distress. She has no wheezes.  Abdominal: Soft. Bowel sounds are normal. She exhibits no distension and no mass. There is tenderness. There is no rebound and no guarding.       Mild tenderness to deep palpation bilat LQs No rebound/ M or guarding   Musculoskeletal: She exhibits no edema.  Lymphadenopathy:    She has no cervical adenopathy.  Neurological: She is alert.  Skin: Skin is warm and dry. No rash noted. No erythema. No pallor.       Brisk capillary refil  Nl skin turgor  Psychiatric: She has a normal mood and affect.          Assessment & Plan:

## 2011-12-22 NOTE — Patient Instructions (Signed)
I think you have had the viral GI virus (noro virus )  Stay in and rest until better - at least 2-3 more days , and stay out of church this week  Keep drinking fluids  When you feel like eating - keep it bland in small amounts (BRAT diet - bananas/ toast/ apple sauce and rice) to start with and advance as tolerated If you get worse let me know  Will check some labs today and update you

## 2011-12-22 NOTE — Assessment & Plan Note (Signed)
Symptoms of norovirus - now improving No red flags for diverticulitis- but will check cbc to be sure Also bmet for lyte levels Adv to continue hydating with sips of fluids like water and gatorade  Adv still contagious for 48 hours after symptoms- will stay in and rest Update if not starting to improve in a week or if worsening

## 2011-12-23 LAB — PATHOLOGIST SMEAR REVIEW

## 2011-12-28 NOTE — Progress Notes (Signed)
Left message on machine at home for patient to return call. 

## 2012-01-05 ENCOUNTER — Encounter: Payer: Self-pay | Admitting: Family Medicine

## 2012-01-05 ENCOUNTER — Ambulatory Visit (INDEPENDENT_AMBULATORY_CARE_PROVIDER_SITE_OTHER): Payer: 59 | Admitting: Family Medicine

## 2012-01-05 VITALS — BP 120/64 | HR 52 | Temp 98.2°F | Ht 62.0 in | Wt 185.0 lb

## 2012-01-05 DIAGNOSIS — N390 Urinary tract infection, site not specified: Secondary | ICD-10-CM | POA: Insufficient documentation

## 2012-01-05 DIAGNOSIS — J069 Acute upper respiratory infection, unspecified: Secondary | ICD-10-CM

## 2012-01-05 DIAGNOSIS — R300 Dysuria: Secondary | ICD-10-CM

## 2012-01-05 DIAGNOSIS — K5289 Other specified noninfective gastroenteritis and colitis: Secondary | ICD-10-CM

## 2012-01-05 DIAGNOSIS — D72820 Lymphocytosis (symptomatic): Secondary | ICD-10-CM | POA: Insufficient documentation

## 2012-01-05 DIAGNOSIS — K529 Noninfective gastroenteritis and colitis, unspecified: Secondary | ICD-10-CM

## 2012-01-05 LAB — POCT URINALYSIS DIPSTICK
Glucose, UA: NEGATIVE
Nitrite, UA: NEGATIVE
Urobilinogen, UA: 0.2

## 2012-01-05 MED ORDER — CIPROFLOXACIN HCL 250 MG PO TABS: 250.0000 mg | ORAL_TABLET | Freq: Two times a day (BID) | ORAL | Status: AC

## 2012-01-05 NOTE — Patient Instructions (Signed)
Keep up good fluid intake  mucinex is good for congestion along with nasal saline and steam  Take the cipro for uti - we will culture urine and call you with a result I'm glad the GI bug is better We will re check labs before Physical in May- but if any worse symptoms before then, update me please

## 2012-01-05 NOTE — Assessment & Plan Note (Signed)
New- mild  Will cx urine and tx with 5 d of cipro  Update if not starting to improve in a week or if worsening   Will call when cx returns inst to drink water

## 2012-01-05 NOTE — Assessment & Plan Note (Signed)
Mild/ early Disc symptomatic care - see instructions on AVS  Update if not starting to improve in a week or if worsening   Reassuring exam

## 2012-01-05 NOTE — Assessment & Plan Note (Signed)
Much improved- getting energy back and well hydrated Will re check cbc next mo before PE

## 2012-01-05 NOTE — Progress Notes (Signed)
Subjective:    Patient ID: Linda Robinson, female    DOB: Robinson 10, 1930, 78 y.o.   MRN: [id number]  HPI Here for f/u of gastroenteritis -- and that is much improved - still weak   (also uri and uti) No more n/v/d Lab Results  Component Value Date   WBC 12.2* 12/22/2011   HGB 15.2* 12/22/2011   HCT 47.4* 12/22/2011   MCV 90.8 12/22/2011   PLT 279.0 12/22/2011    Atypical lymphocytes- needs to be re checked  ? If was viral in nature She has no fever or remaining GI symptoms at all but is currently battling both a cold and uti  Started a cold yesterday  Dry cough and runny nose, no fever  No chills or aches  Throat is scratchy / tender/ ears are ok  A little run down  Urinary symptoms-- frequency and some burning at end of urination  No blood No back pain or n/v/ or fever  No bladder pain, but some pressure sensation   Patient Active Problem List  Diagnoses  . HYPERLIPIDEMIA  . GOUT  . ANXIETY STATE NEC  . REACTION, ACUTE STRESS W/EMOTIONAL DSTURB  . DISORDER, DEPRESSIVE NEC  . HYPERTENSION  . BRADYCARDIA  . DIVERTICULOSIS, COLON  . CONSTIPATION  . RENAL INSUFFICIENCY  . CYSTITIS, HEMORRHAGIC  . CYSTITIS CYSTICA  . OSTEOARTHRITIS  . DEGENERATIVE DISC DISEASE  . LEG CRAMPS  . OSTEOPENIA  . Gastroenteritis, acute  . Viral URI  . UTI (lower urinary tract infection)  . Lymphocytosis   Past Medical History  Diagnosis Date  . History of diverticulitis of colon   . Diverticulosis of colon   . Gout   . HLD (hyperlipidemia)   . HTN (hypertension)   . OA (osteoarthritis)   . Osteopenia   . GERD (gastroesophageal reflux disease)     atypical chest pain  . Basal cell carcinoma     removed   Past Surgical History  Procedure Date  . Appendectomy   . Cataract extraction 04/2006  . Cholecystectomy   . Partial colectomy 09/1999  . Back surgery   . Neck surgery   . Knee surgery     Medial meniscus tear   History  Substance Use Topics  . Smoking status: Never Smoker     . Smokeless tobacco: Not on file  . Alcohol Use: Not on file   Family History  Problem Relation Age of Onset  . Cancer Father     throat and bladder  . Diabetes Father   . Hypertension Father   . Heart attack Father   . Stroke Mother   . Alzheimer's disease Mother   . Hypertension Mother    Allergies  Allergen Reactions  . Ace Inhibitors     REACTION: increased K+, increased creat.  . Cephalosporins     REACTION: reaction not known  . Colesevelam     REACTION: constipation  . Lovastatin     REACTION: doesn't work  . Raloxifene     REACTION: cramps  . Rosuvastatin     REACTION: myalgia  . Simvastatin     REACTION: myalgia   Current Outpatient Prescriptions on File Prior to Visit  Medication Sig Dispense Refill  . alendronate (FOSAMAX) 70 MG tablet TAKE 1 TABLET EVERY WEEK AS DIRECTED  12 tablet  3  . allopurinol (ZYLOPRIM) 100 MG tablet TAKE 1 TABLET DAILY  90 tablet  3  . amLODipine (NORVASC) 5 MG tablet TAKE 1 TABLET DAILY IN THE  MORNING  90 tablet  3  . busPIRone (BUSPAR) 15 MG tablet TAKE ONE-HALF (1/2) TABLET TWICE A DAY  90 tablet  3  . Cranberry 250 MG CAPS Take 1 capsule by mouth daily.        Marland Kitchen docusate sodium (COLACE) 100 MG capsule Take 200 mg by mouth daily.        . fish oil-omega-3 fatty acids 1000 MG capsule Take 1 g by mouth 2 (two) times daily.       . meclizine (ANTIVERT) 25 MG tablet Take 25 mg by mouth 4 (four) times daily as needed.        . metoprolol (TOPROL-XL) 50 MG 24 hr tablet TAKE 1 TABLET TWICE A DAY  180 tablet  2  . polycarbophil (FIBERCON) 625 MG tablet Take 625 mg by mouth daily as needed.        . pravastatin (PRAVACHOL) 20 MG tablet TAKE ONE-HALF (1/2) TO 1 TABLET (10 - 20 MG TOTAL) DAILY  90 tablet  3  . triamterene-hydrochlorothiazide (MAXZIDE) 75-50 MG per tablet Take 0.5 tablets by mouth daily.             Review of Systems Review of Systems  Constitutional: Negative for fever, appetite change, and unexpected weight change.   Eyes: Negative for pain and visual disturbance ENT pos for runny/stuffy nose/ clear rhinorrhea/ mild st  Respiratory: Negative for sob or cough Cardiovascular: Negative for cp or palpitations    Gastrointestinal: Negative for nausea, diarrhea and constipation.  Genitourinary: pos for urgency and frequency, neg for blood in urine or odor.  Skin: Negative for pallor or rash   Neurological: Negative for weakness, light-headedness, numbness and headaches.  Hematological: Negative for adenopathy. Does not bruise/bleed easily.  Psychiatric/Behavioral: Negative for dysphoric mood. The patient is not nervous/anxious.          Objective:   Physical Exam  Constitutional: She appears well-developed and well-nourished. No distress.  HENT:  Head: Normocephalic and atraumatic.  Right Ear: External ear normal.  Left Ear: External ear normal.  Mouth/Throat: Oropharynx is clear and moist. No oropharyngeal exudate.       Nares are injected and congested  Clear rhinorrhea No sinus tenderness   Eyes: Conjunctivae and EOM are normal. Pupils are equal, round, and reactive to light. Right eye exhibits no discharge. Left eye exhibits no discharge.  Neck: Normal range of motion. Neck supple. No JVD present. Carotid bruit is not present. No thyromegaly present.  Cardiovascular: Normal rate, regular rhythm, normal heart sounds and intact distal pulses.  Exam reveals no gallop.   Pulmonary/Chest: Effort normal and breath sounds normal. No respiratory distress. She has no wheezes.  Abdominal: Soft. Bowel sounds are normal. She exhibits no distension and no mass. There is no tenderness.       No suprapubic tenderness    Musculoskeletal: Normal range of motion. She exhibits no edema and no tenderness.       No cva tenderness   Lymphadenopathy:    She has no cervical adenopathy.  Neurological: She is alert. She has normal reflexes. No cranial nerve deficit. She exhibits normal muscle tone. Coordination normal.   Skin: Skin is warm and dry. No rash noted. No erythema. No pallor.  Psychiatric: She has a normal mood and affect.          Assessment & Plan:

## 2012-01-05 NOTE — Assessment & Plan Note (Signed)
?   If viral cause- done during last viral gastroenteritis Plan to re check this when current new illnesses are better ie: uti/ uri Will update if fever or other symptoms

## 2012-01-07 LAB — URINE CULTURE: Colony Count: 30000

## 2012-01-07 LAB — POCT UA - MICROSCOPIC ONLY

## 2012-01-23 ENCOUNTER — Other Ambulatory Visit: Payer: Self-pay | Admitting: Family Medicine

## 2012-02-01 ENCOUNTER — Telehealth: Payer: Self-pay | Admitting: Family Medicine

## 2012-02-01 DIAGNOSIS — N259 Disorder resulting from impaired renal tubular function, unspecified: Secondary | ICD-10-CM

## 2012-02-01 DIAGNOSIS — M109 Gout, unspecified: Secondary | ICD-10-CM

## 2012-02-01 DIAGNOSIS — E785 Hyperlipidemia, unspecified: Secondary | ICD-10-CM

## 2012-02-01 DIAGNOSIS — M899 Disorder of bone, unspecified: Secondary | ICD-10-CM

## 2012-02-01 DIAGNOSIS — I10 Essential (primary) hypertension: Secondary | ICD-10-CM

## 2012-02-01 NOTE — Telephone Encounter (Signed)
Message copied by Judy Pimple on Mon Feb 01, 2012  1:50 PM ------      Message from: Alvina Chou      Created: Thu Jan 28, 2012  4:34 PM      Regarding: Labs for Wednesday May 8       Patient is scheduled for CPX labs, please order future labs, Thanks , Camelia Eng

## 2012-02-03 ENCOUNTER — Other Ambulatory Visit: Payer: Self-pay | Admitting: Family Medicine

## 2012-02-03 ENCOUNTER — Other Ambulatory Visit: Payer: Self-pay | Admitting: *Deleted

## 2012-02-03 ENCOUNTER — Other Ambulatory Visit (INDEPENDENT_AMBULATORY_CARE_PROVIDER_SITE_OTHER): Payer: 59

## 2012-02-03 DIAGNOSIS — I10 Essential (primary) hypertension: Secondary | ICD-10-CM

## 2012-02-03 DIAGNOSIS — M109 Gout, unspecified: Secondary | ICD-10-CM

## 2012-02-03 DIAGNOSIS — M899 Disorder of bone, unspecified: Secondary | ICD-10-CM

## 2012-02-03 DIAGNOSIS — E785 Hyperlipidemia, unspecified: Secondary | ICD-10-CM

## 2012-02-03 DIAGNOSIS — N259 Disorder resulting from impaired renal tubular function, unspecified: Secondary | ICD-10-CM

## 2012-02-03 DIAGNOSIS — M949 Disorder of cartilage, unspecified: Secondary | ICD-10-CM

## 2012-02-03 LAB — CBC WITH DIFFERENTIAL/PLATELET
Basophils Relative: 0.3 % (ref 0.0–3.0)
Eosinophils Relative: 1.2 % (ref 0.0–5.0)
HCT: 42.6 % (ref 36.0–46.0)
Hemoglobin: 14.2 g/dL (ref 12.0–15.0)
Lymphs Abs: 5.6 10*3/uL — ABNORMAL HIGH (ref 0.7–4.0)
Monocytes Relative: 7 % (ref 3.0–12.0)
Platelets: 270 10*3/uL (ref 150.0–400.0)
RBC: 4.78 Mil/uL (ref 3.87–5.11)
WBC: 10.4 10*3/uL (ref 4.5–10.5)

## 2012-02-03 LAB — COMPREHENSIVE METABOLIC PANEL
Albumin: 4 g/dL (ref 3.5–5.2)
CO2: 31 mEq/L (ref 19–32)
GFR: 48.14 mL/min — ABNORMAL LOW (ref >60.00)
Glucose, Bld: 97 mg/dL (ref 70–99)
Potassium: 3.9 mEq/L (ref 3.5–5.1)
Sodium: 141 mEq/L (ref 135–145)
Total Bilirubin: 0.6 mg/dL (ref 0.3–1.2)
Total Protein: 7.4 g/dL (ref 6.0–8.3)

## 2012-02-03 LAB — URIC ACID: Uric Acid, Serum: 6 mg/dL (ref 2.4–7.0)

## 2012-02-03 LAB — LIPID PANEL: Cholesterol: 210 mg/dL — ABNORMAL HIGH (ref 0–200)

## 2012-02-03 LAB — LDL CHOLESTEROL, DIRECT: Direct LDL: 134 mg/dL

## 2012-02-07 ENCOUNTER — Other Ambulatory Visit: Payer: Self-pay | Admitting: Family Medicine

## 2012-02-10 ENCOUNTER — Encounter: Payer: Self-pay | Admitting: Family Medicine

## 2012-02-10 ENCOUNTER — Ambulatory Visit (INDEPENDENT_AMBULATORY_CARE_PROVIDER_SITE_OTHER): Payer: 59 | Admitting: Family Medicine

## 2012-02-10 VITALS — BP 120/70 | HR 60 | Temp 98.2°F | Ht 62.0 in | Wt 186.0 lb

## 2012-02-10 DIAGNOSIS — I10 Essential (primary) hypertension: Secondary | ICD-10-CM

## 2012-02-10 DIAGNOSIS — Z23 Encounter for immunization: Secondary | ICD-10-CM

## 2012-02-10 DIAGNOSIS — M949 Disorder of cartilage, unspecified: Secondary | ICD-10-CM

## 2012-02-10 DIAGNOSIS — Z1211 Encounter for screening for malignant neoplasm of colon: Secondary | ICD-10-CM | POA: Insufficient documentation

## 2012-02-10 DIAGNOSIS — M899 Disorder of bone, unspecified: Secondary | ICD-10-CM

## 2012-02-10 DIAGNOSIS — E785 Hyperlipidemia, unspecified: Secondary | ICD-10-CM

## 2012-02-10 DIAGNOSIS — M109 Gout, unspecified: Secondary | ICD-10-CM

## 2012-02-10 NOTE — Assessment & Plan Note (Signed)
bp in fair control at this time  No changes needed  Disc lifstyle change with low sodium diet and exercise   Rev labs and medications

## 2012-02-10 NOTE — Progress Notes (Signed)
Subjective:    Patient ID: Linda Robinson, female    DOB: 09-16-1929, 78 y.o.   MRN: [id number]  HPI Here for check up of chronic medical conditions and to review health mt list   Has a head cold - getting over it with cong and st  Husband gave it to her    bp is   120/70  Today BP Readings from Last 3 Encounters:  02/10/12 120/70  01/05/12 120/64  12/22/11 142/68    No cp or palpitations or headaches or edema  No side effects to medicines    Gout -on allopurinol Lab Results  Component Value Date   ALT 15 02/03/2012   AST 20 02/03/2012   ALKPHOS 50 02/03/2012   BILITOT 0.6 02/03/2012    No results found for this basename: URICACID      Chemistry      Component Value Date/Time   NA 141 02/03/2012 0948   K 3.9 02/03/2012 0948   CL 102 02/03/2012 0948   CO2 31 02/03/2012 0948   BUN 27* 02/03/2012 0948   CREATININE 1.2 02/03/2012 0948      Component Value Date/Time   CALCIUM 9.4 02/03/2012 0948   ALKPHOS 50 02/03/2012 0948   AST 20 02/03/2012 0948   ALT 15 02/03/2012 0948   BILITOT 0.6 02/03/2012 0948      Wt is up 1 lb with bmi of 34  Osteopenia dexa 2010- due for that - wants to get at [facility]  On fosamax -- taking it once per week - for several years  D is stable - still takes her ca and D  No fx  On pravachol and diet for lipids-- can tolerate about 2 doses per week and 1/2 tab the other days  All she can tolerate Is eating a low fat diet - really trying  Tried the exercise bike- short periods of time is all she can do  Lab Results  Component Value Date   CHOL 210* 02/03/2012   HDL 60.00 02/03/2012   LDLCALC 112* 08/04/2011   LDLDIRECT 134.0 02/03/2012   TRIG 139.0 02/03/2012   CHOLHDL 4 02/03/2012    Pneumovax- has not had   Due for Td --will get that today   colonosc 9/00- nothing wrong  Does not want another one at her age   mammo nl 11/12 Self exam -no lumps or changes   No gyn problems   Patient Active Problem List  Diagnoses  . HYPERLIPIDEMIA  . GOUT  . ANXIETY STATE  NEC  . REACTION, ACUTE STRESS W/EMOTIONAL DSTURB  . DISORDER, DEPRESSIVE NEC  . HYPERTENSION  . BRADYCARDIA  . DIVERTICULOSIS, COLON  . CONSTIPATION  . RENAL INSUFFICIENCY  . OSTEOARTHRITIS  . DEGENERATIVE DISC DISEASE  . LEG CRAMPS  . OSTEOPENIA  . Viral URI  . UTI (lower urinary tract infection)  . Lymphocytosis  . Colon cancer screening   Past Medical History  Diagnosis Date  . History of diverticulitis of colon   . Diverticulosis of colon   . Gout   . HLD (hyperlipidemia)   . HTN (hypertension)   . OA (osteoarthritis)   . Osteopenia   . GERD (gastroesophageal reflux disease)     atypical chest pain  . Basal cell carcinoma     removed   Past Surgical History  Procedure Date  . Appendectomy   . Cataract extraction 04/2006  . Cholecystectomy   . Partial colectomy 09/1999  . Back surgery   . Neck  surgery   . Knee surgery     Medial meniscus tear   History  Substance Use Topics  . Smoking status: Never Smoker   . Smokeless tobacco: Not on file  . Alcohol Use: Not on file   Family History  Problem Relation Age of Onset  . Cancer Father     throat and bladder  . Diabetes Father   . Hypertension Father   . Heart attack Father   . Stroke Mother   . Alzheimer's disease Mother   . Hypertension Mother    Allergies  Allergen Reactions  . Ace Inhibitors     REACTION: increased K+, increased creat.  . Cephalosporins     REACTION: reaction not known  . Colesevelam     REACTION: constipation  . Lovastatin     REACTION: doesn't work  . Raloxifene     REACTION: cramps  . Rosuvastatin     REACTION: myalgia  . Simvastatin     REACTION: myalgia   Current Outpatient Prescriptions on File Prior to Visit  Medication Sig Dispense Refill  . alendronate (FOSAMAX) 70 MG tablet TAKE 1 TABLET EVERY WEEK AS DIRECTED  12 tablet  3  . allopurinol (ZYLOPRIM) 100 MG tablet TAKE 1 TABLET DAILY  90 tablet  3  . amLODipine (NORVASC) 5 MG tablet TAKE 1 TABLET DAILY IN THE  MORNING  90 tablet  3  . busPIRone (BUSPAR) 15 MG tablet TAKE ONE-HALF (1/2) TABLET TWICE A DAY  90 tablet  3  . Cranberry 250 MG CAPS Take 1 capsule by mouth daily.        Marland Kitchen docusate sodium (COLACE) 100 MG capsule Take 200 mg by mouth daily.        . fish oil-omega-3 fatty acids 1000 MG capsule Take 1 g by mouth 2 (two) times daily.       . meclizine (ANTIVERT) 25 MG tablet Take 25 mg by mouth 4 (four) times daily as needed.        . metoprolol succinate (TOPROL-XL) 50 MG 24 hr tablet TAKE 1 TABLET TWICE A DAY  180 tablet  3  . polycarbophil (FIBERCON) 625 MG tablet Take 625 mg by mouth daily as needed.        . pravastatin (PRAVACHOL) 20 MG tablet TAKE ONE-HALF (1/2) TO 1 TABLET (10 - 20 MG TOTAL) DAILY  90 tablet  3  . triamterene-hydrochlorothiazide (DYAZIDE) 37.5-25 MG per capsule TAKE 1 CAPSULE DAILY  90 capsule  0       Review of Systems Review of Systems  Constitutional: Negative for fever, appetite change, fatigue and unexpected weight change.  Eyes: Negative for pain and visual disturbance.  ENT pos for cold symptoms / congestion  Respiratory: Negative for cough and shortness of breath.   Cardiovascular: Negative for cp or palpitations    Gastrointestinal: Negative for nausea, diarrhea and constipation.  Genitourinary: Negative for urgency and frequency.  Skin: Negative for pallor or rash   MSK pos for arthritis aches and pains - overall no changes Neurological: Negative for weakness, light-headedness, numbness and headaches.  Hematological: Negative for adenopathy. Does not bruise/bleed easily.  Psychiatric/Behavioral: Negative for dysphoric mood. The patient is not nervous/anxious.         Objective:   Physical Exam  Constitutional: She appears well-developed and well-nourished. No distress.       Obese, elderly and well appearing   HENT:  Head: Normocephalic and atraumatic.  Mouth/Throat: Oropharynx is clear and moist.  Eyes: Conjunctivae  and EOM are normal. Pupils  are equal, round, and reactive to light. No scleral icterus.  Neck: Normal range of motion. Neck supple. No JVD present. Carotid bruit is not present. No thyromegaly present.  Cardiovascular: Normal rate, regular rhythm, normal heart sounds and intact distal pulses.  Exam reveals no gallop.   Pulmonary/Chest: Effort normal and breath sounds normal. No respiratory distress. She has no wheezes.  Abdominal: Soft. Bowel sounds are normal. She exhibits no distension, no abdominal bruit and no mass. There is no tenderness.  Genitourinary: No breast swelling, tenderness, discharge or bleeding.       Breast exam: No mass, nodules, thickening, tenderness, bulging, retraction, inflamation, nipple discharge or skin changes noted.  No axillary or clavicular LA.  Chaperoned exam.    Musculoskeletal: Normal range of motion. She exhibits no edema and no tenderness.       Changes of OA in hands and feet  Lymphadenopathy:    She has no cervical adenopathy.  Neurological: She is alert. She has normal reflexes. No cranial nerve deficit. She exhibits normal muscle tone. Coordination normal.       No tremor  Skin: Skin is warm and dry. No rash noted. No erythema. No pallor.       SK s diffusely with some lentigos   Psychiatric: She has a normal mood and affect.          Assessment & Plan:

## 2012-02-10 NOTE — Assessment & Plan Note (Signed)
Due for dexa - last 2010 , this was ordered  On ca and D and 2-3 y of fosamax No fractures Continue current tx  Disc safeety

## 2012-02-10 NOTE — Assessment & Plan Note (Signed)
Fair control Can only tolerate a small amt of pravastain at this time  LDL - is just over 130 Disc goals for lipids and reasons to control them Rev labs with pt Rev low sat fat diet in detail

## 2012-02-10 NOTE — Assessment & Plan Note (Signed)
Is well controlled with allopurinol , nl LFTs  No episodes  Also low purine diet

## 2012-02-10 NOTE — Assessment & Plan Note (Signed)
Pt not interested in colonosc at this age -last one nl  Will do ifob No bowel or GI symptoms

## 2012-02-10 NOTE — Patient Instructions (Signed)
Please do stool card for colon cancer screening  We will schedule bone density test at check out  Tetanus and pneumonia vaccines today  Continue calcium and vitamin D Try to stay as active as you can

## 2012-02-17 ENCOUNTER — Other Ambulatory Visit: Payer: 59

## 2012-02-17 DIAGNOSIS — Z1211 Encounter for screening for malignant neoplasm of colon: Secondary | ICD-10-CM

## 2012-02-17 LAB — FECAL OCCULT BLOOD, IMMUNOCHEMICAL: Fecal Occult Bld: NEGATIVE

## 2012-02-18 ENCOUNTER — Encounter: Payer: Self-pay | Admitting: *Deleted

## 2012-02-18 ENCOUNTER — Encounter: Payer: Self-pay | Admitting: Family Medicine

## 2012-03-24 ENCOUNTER — Telehealth: Payer: Self-pay

## 2012-03-24 NOTE — Telephone Encounter (Signed)
Pt left v/m about billing question for DOS 02/03/12. Left v/m for pt to call back.

## 2012-03-25 NOTE — Telephone Encounter (Signed)
Pt notified to call Annabelle in billing.

## 2012-05-07 ENCOUNTER — Other Ambulatory Visit: Payer: Self-pay | Admitting: Family Medicine

## 2012-05-09 ENCOUNTER — Other Ambulatory Visit: Payer: Self-pay

## 2012-05-09 MED ORDER — BUSPIRONE HCL 15 MG PO TABS: 15.0000 mg | ORAL_TABLET | Freq: Three times a day (TID) | ORAL | Status: DC

## 2012-05-21 ENCOUNTER — Other Ambulatory Visit: Payer: Self-pay | Admitting: Family Medicine

## 2012-05-23 ENCOUNTER — Other Ambulatory Visit: Payer: Self-pay

## 2012-05-23 MED ORDER — TRIAMTERENE-HCTZ 37.5-25 MG PO CAPS: 1.0000 | ORAL_CAPSULE | Freq: Every day | ORAL | Status: DC

## 2012-11-11 ENCOUNTER — Other Ambulatory Visit: Payer: Self-pay | Admitting: Family Medicine

## 2013-01-09 ENCOUNTER — Other Ambulatory Visit: Payer: Self-pay | Admitting: Family Medicine

## 2013-01-09 NOTE — Telephone Encounter (Signed)
Please schedule PE this summer and refil until then 

## 2013-01-09 NOTE — Telephone Encounter (Signed)
No recent appt and no future appt, ok to refill? 

## 2013-01-11 NOTE — Telephone Encounter (Signed)
appt scheduled and meds refilled until then 

## 2013-01-13 ENCOUNTER — Other Ambulatory Visit: Payer: Self-pay | Admitting: Family Medicine

## 2013-01-17 ENCOUNTER — Ambulatory Visit: Payer: 59 | Admitting: Family Medicine

## 2013-03-23 ENCOUNTER — Other Ambulatory Visit: Payer: Self-pay | Admitting: Family Medicine

## 2013-04-11 ENCOUNTER — Encounter: Payer: Self-pay | Admitting: Family Medicine

## 2013-04-11 ENCOUNTER — Ambulatory Visit (INDEPENDENT_AMBULATORY_CARE_PROVIDER_SITE_OTHER): Payer: Medicare Other | Admitting: Family Medicine

## 2013-04-11 VITALS — BP 130/82 | HR 45 | Temp 97.4°F | Ht 61.75 in | Wt 177.8 lb

## 2013-04-11 DIAGNOSIS — M949 Disorder of cartilage, unspecified: Secondary | ICD-10-CM

## 2013-04-11 DIAGNOSIS — E785 Hyperlipidemia, unspecified: Secondary | ICD-10-CM

## 2013-04-11 DIAGNOSIS — N259 Disorder resulting from impaired renal tubular function, unspecified: Secondary | ICD-10-CM

## 2013-04-11 DIAGNOSIS — I10 Essential (primary) hypertension: Secondary | ICD-10-CM

## 2013-04-11 DIAGNOSIS — Z0000 Encounter for general adult medical examination without abnormal findings: Secondary | ICD-10-CM | POA: Insufficient documentation

## 2013-04-11 DIAGNOSIS — M899 Disorder of bone, unspecified: Secondary | ICD-10-CM

## 2013-04-11 LAB — COMPREHENSIVE METABOLIC PANEL
ALT: 13 U/L (ref 0–35)
AST: 21 U/L (ref 0–37)
Albumin: 4.1 g/dL (ref 3.5–5.2)
BUN: 29 mg/dL — ABNORMAL HIGH (ref 6–23)
Calcium: 9.9 mg/dL (ref 8.4–10.5)
Chloride: 100 mEq/L (ref 96–112)
Potassium: 4.5 mEq/L (ref 3.5–5.1)
Sodium: 140 mEq/L (ref 135–145)
Total Protein: 7.7 g/dL (ref 6.0–8.3)

## 2013-04-11 LAB — CBC WITH DIFFERENTIAL/PLATELET
Basophils Relative: 0.4 % (ref 0.0–3.0)
Eosinophils Absolute: 0.1 10*3/uL (ref 0.0–0.7)
Lymphocytes Relative: 50.1 % — ABNORMAL HIGH (ref 12.0–46.0)
MCHC: 33.4 g/dL (ref 30.0–36.0)
Neutrophils Relative %: 40.5 % — ABNORMAL LOW (ref 43.0–77.0)
RBC: 4.54 Mil/uL (ref 3.87–5.11)
WBC: 11 10*3/uL — ABNORMAL HIGH (ref 4.5–10.5)

## 2013-04-11 LAB — LIPID PANEL
Total CHOL/HDL Ratio: 4
VLDL: 30.8 mg/dL (ref 0.0–40.0)

## 2013-04-11 LAB — LDL CHOLESTEROL, DIRECT: Direct LDL: 135.4 mg/dL

## 2013-04-11 NOTE — Assessment & Plan Note (Signed)
Due for dexa -on year for 4 of fosamax Disc fall risk -and ways to prevent falls (pt will use walker outdoors) No fractures Rev ca and D intake

## 2013-04-11 NOTE — Assessment & Plan Note (Signed)
bp in fair control at this time  No changes needed  Disc lifstyle change with low sodium diet and exercise  Lab today 

## 2013-04-11 NOTE — Assessment & Plan Note (Signed)
Lab today with pravastatin and diet Rev low sat fat diet  Disc goals for lipids and reasons to control them

## 2013-04-11 NOTE — Assessment & Plan Note (Signed)
In pt with hx of lipids and HTN -now controlled  Disc imp of good water intake  Lab today

## 2013-04-11 NOTE — Assessment & Plan Note (Signed)
Reviewed health habits including diet and exercise and skin cancer prevention Also reviewed health mt list, fam hx and immunizations  See HPI Will work on her advanced directive

## 2013-04-11 NOTE — Progress Notes (Signed)
Subjective:    Patient ID: Linda Robinson, female    DOB: Oct 14, 1928, 79 y.o.   MRN: [id number]  HPI I have personally reviewed the Medicare Annual Wellness questionnaire and have noted 1. The patient's medical and social history 2. Their use of alcohol, tobacco or illicit drugs 3. Their current medications and supplements 4. The patient's functional ability including ADL's, fall risks, home safety risks and hearing or visual             impairment. 5. Diet and physical activities 6. Evidence for depression or mood disorders  The patients weight, height, BMI have been recorded in the chart and visual acuity is per eye clinic.  I have made referrals, counseling and provided education to the patient based review of the above and I have provided the pt with a written personalized care plan for preventive services.  Doing ok other than dealing with her chronic back pain - given form for handicapped placard  See scanned forms.  Routine anticipatory guidance given to patient.  See health maintenance. Flu vaccine - had it this fall  Shingles vaccine- declines due to inability to pay for it - medicare will not cover it for her  PNA vaccine 5/13 Tetanus vaccine 5/13 Colon screen colonosc 9/00 and neg ifob since - no symptoms at all  Breast cancer screening mammogram 11/12-she is overdue and will make own appt at [facility]  Self exam-no lumps or changes  Pap in 7/11 , no gyn problems or symptoms at all  dexa 5/10 - osteopenia -on fosamax  (did not end up getting it as planned last year) Thinks it is her 4th year on fosamax-one more year to go  Advance directive-does not have one -will work on that (has a POA)  Cognitive function addressed- see scanned forms- and if abnormal then additional documentation follows. - no major memory problems- is mentally sharp   Falls- had a fall in the yard the other day-mild / got her foot tangled in the hose  No fx  Will use a walker outdoors from now   Mood  - is good / stays cheerful (though does have stress)- she is very motivated and active   PMH and SH reviewed  Meds, vitals, and allergies reviewed.   ROS: See HPI.  Otherwise negative.    Wt is dwn 9 lb with bmi of 32 Watching what she eats - healthier foods  A little exercise - does as much as she can/ some walking / stationary bike   colonosc 9/00 Neg ifob since then Hx of diverticulosis  bp is stable today  No cp or palpitations or headaches or edema  No side effects to medicines  BP Readings from Last 3 Encounters:  04/11/13 130/82  02/10/12 120/70  01/05/12 120/64     Due for labs   Hyperlipidemia Due for labs -intol of statins except pravastatin Does avoid fatty foods   Patient Active Problem List   Diagnosis Date Noted  . Colon cancer screening 02/10/2012  . Lymphocytosis 01/05/2012  . BRADYCARDIA 05/19/2010  . OSTEOPENIA 01/30/2009  . CONSTIPATION 11/01/2007  . ANXIETY STATE NEC 02/14/2007  . REACTION, ACUTE STRESS W/EMOTIONAL DSTURB 02/14/2007  . DISORDER, DEPRESSIVE NEC 02/14/2007  . HYPERLIPIDEMIA 02/04/2007  . GOUT 02/04/2007  . HYPERTENSION 02/04/2007  . DIVERTICULOSIS, COLON 02/04/2007  . RENAL INSUFFICIENCY 02/04/2007  . OSTEOARTHRITIS 02/04/2007  . DEGENERATIVE DISC DISEASE 02/04/2007  . LEG CRAMPS 02/04/2007   Past Medical History  Diagnosis Date  .  History of diverticulitis of colon   . Diverticulosis of colon   . Gout   . HLD (hyperlipidemia)   . HTN (hypertension)   . OA (osteoarthritis)   . Osteopenia   . GERD (gastroesophageal reflux disease)     atypical chest pain  . Basal cell carcinoma     removed   Past Surgical History  Procedure Laterality Date  . Appendectomy    . Cataract extraction  04/2006  . Cholecystectomy    . Partial colectomy  09/1999  . Back surgery    . Neck surgery    . Knee surgery      Medial meniscus tear   History  Substance Use Topics  . Smoking status: Never Smoker   . Smokeless tobacco: Not on  file  . Alcohol Use: No   Family History  Problem Relation Age of Onset  . Cancer Father     throat and bladder  . Diabetes Father   . Hypertension Father   . Heart attack Father   . Stroke Mother   . Alzheimer's disease Mother   . Hypertension Mother    Allergies  Allergen Reactions  . Ace Inhibitors     REACTION: increased K+, increased creat.  . Cephalosporins     REACTION: reaction not known  . Colesevelam     REACTION: constipation  . Lovastatin     REACTION: doesn't work  . Raloxifene     REACTION: cramps  . Rosuvastatin     REACTION: myalgia  . Simvastatin     REACTION: myalgia   Current Outpatient Prescriptions on File Prior to Visit  Medication Sig Dispense Refill  . allopurinol (ZYLOPRIM) 100 MG tablet TAKE 1 TABLET DAILY  90 tablet  0  . amLODipine (NORVASC) 5 MG tablet TAKE 1 TABLET DAILY IN THE MORNING  90 tablet  0  . busPIRone (BUSPAR) 15 MG tablet TAKE 1 TABLET THREE TIMES A DAY  90 tablet  0  . Cranberry 250 MG CAPS Take 1 capsule by mouth daily.        Marland Kitchen docusate sodium (COLACE) 100 MG capsule Take 200 mg by mouth daily.        . fish oil-omega-3 fatty acids 1000 MG capsule Take 1 g by mouth 2 (two) times daily.       . meclizine (ANTIVERT) 25 MG tablet Take 25 mg by mouth 4 (four) times daily as needed.        . metoprolol succinate (TOPROL-XL) 50 MG 24 hr tablet TAKE 1 TABLET TWICE A DAY  180 tablet  0  . polycarbophil (FIBERCON) 625 MG tablet Take 625 mg by mouth daily as needed.        . pravastatin (PRAVACHOL) 20 MG tablet TAKE ONE-HALF (1/2) TO 1 TABLET (10 - 20 MG TOTAL) DAILY  90 tablet  3  . triamterene-hydrochlorothiazide (DYAZIDE) 37.5-25 MG per capsule TAKE 1 CAPSULE DAILY  90 capsule  1   No current facility-administered medications on file prior to visit.    Review of Systems Review of Systems  Constitutional: Negative for fever, appetite change, fatigue and unexpected weight change.  Eyes: Negative for pain and visual disturbance.   Respiratory: Negative for cough and shortness of breath.   Cardiovascular: Negative for cp or palpitations    Gastrointestinal: Negative for nausea, diarrhea and constipation.  Genitourinary: Negative for urgency and frequency.  Skin: Negative for pallor or rash   MSK pos for chronic back pain  Neurological: Negative for  weakness, light-headedness, numbness and headaches. pos for worse balance with age Hematological: Negative for adenopathy. Does not bruise/bleed easily.  Psychiatric/Behavioral: Negative for dysphoric mood. The patient is not nervous/anxious.         Objective:   Physical Exam  Constitutional: She appears well-developed and well-nourished. No distress.  obese and well appearing   HENT:  Head: Normocephalic and atraumatic.  Right Ear: External ear normal.  Left Ear: External ear normal.  Nose: Nose normal.  Mouth/Throat: Oropharynx is clear and moist.  Eyes: Conjunctivae and EOM are normal. Pupils are equal, round, and reactive to light. Right eye exhibits no discharge. Left eye exhibits no discharge. No scleral icterus.  Neck: Normal range of motion. Neck supple. No JVD present. Carotid bruit is not present. No thyromegaly present.  Cardiovascular: Normal rate, regular rhythm, normal heart sounds and intact distal pulses.  Exam reveals no gallop.   Pulmonary/Chest: Effort normal and breath sounds normal. No respiratory distress. She has no wheezes. She exhibits no tenderness.  Abdominal: Soft. Bowel sounds are normal. She exhibits no distension, no abdominal bruit and no mass. There is no tenderness.  Genitourinary: No breast swelling, tenderness, discharge or bleeding.  Breast exam: No mass, nodules, thickening, tenderness, bulging, retraction, inflamation, nipple discharge or skin changes noted.  No axillary or clavicular LA.  Chaperoned exam.    Musculoskeletal: She exhibits no edema and no tenderness.  Poor rom spine  Lymphadenopathy:    She has no cervical  adenopathy.  Neurological: She is alert. She has normal reflexes. No cranial nerve deficit. She exhibits normal muscle tone. Coordination normal.  Skin: Skin is warm and dry. No rash noted. No erythema. No pallor.  Psychiatric: She has a normal mood and affect.          Assessment & Plan:

## 2013-04-11 NOTE — Patient Instructions (Addendum)
Labs today  Work on putting together a living will Don't forget to schedule your annual mammogram at [facility] working on [employer] and exercise  Use walker outdoors if needed to prevent falls  Stop up front on the way out to get your bone density test scheduled

## 2013-04-12 LAB — VITAMIN D 25 HYDROXY (VIT D DEFICIENCY, FRACTURES): Vit D, 25-Hydroxy: 43 ng/mL (ref 30–89)

## 2013-04-18 ENCOUNTER — Other Ambulatory Visit: Payer: Self-pay | Admitting: Family Medicine

## 2013-04-26 ENCOUNTER — Ambulatory Visit (INDEPENDENT_AMBULATORY_CARE_PROVIDER_SITE_OTHER): Payer: Medicare Other | Admitting: Family Medicine

## 2013-04-26 ENCOUNTER — Encounter: Payer: Self-pay | Admitting: Family Medicine

## 2013-04-26 VITALS — BP 140/78 | HR 66 | Temp 98.5°F | Ht 61.75 in | Wt 181.5 lb

## 2013-04-26 DIAGNOSIS — R7989 Other specified abnormal findings of blood chemistry: Secondary | ICD-10-CM

## 2013-04-26 DIAGNOSIS — I10 Essential (primary) hypertension: Secondary | ICD-10-CM

## 2013-04-26 DIAGNOSIS — R609 Edema, unspecified: Secondary | ICD-10-CM

## 2013-04-26 DIAGNOSIS — N259 Disorder resulting from impaired renal tubular function, unspecified: Secondary | ICD-10-CM

## 2013-04-26 DIAGNOSIS — R799 Abnormal finding of blood chemistry, unspecified: Secondary | ICD-10-CM

## 2013-04-26 DIAGNOSIS — N390 Urinary tract infection, site not specified: Secondary | ICD-10-CM

## 2013-04-26 LAB — POCT URINALYSIS DIPSTICK
Bilirubin, UA: NEGATIVE
Nitrite, UA: NEGATIVE
Protein, UA: NEGATIVE
Urobilinogen, UA: 0.2
pH, UA: 6.5

## 2013-04-26 LAB — POCT UA - MICROSCOPIC ONLY
Casts, Ur, LPF, POC: 0
Crystals, Ur, HPF, POC: 0
Yeast, UA: 0

## 2013-04-26 MED ORDER — FUROSEMIDE 20 MG PO TABS: ORAL_TABLET | ORAL | Status: DC

## 2013-04-26 NOTE — Assessment & Plan Note (Signed)
Lab Results  Component Value Date   CREATININE 1.4* 04/11/2013   did hold maxzide - pt is tolerating ok at this time (given lasix to use if needed for edema) Also will cx and tx uti and then re check  If not imp - consider renal consult

## 2013-04-26 NOTE — Progress Notes (Signed)
Subjective:    Patient ID: Linda Robinson, female    DOB: 1928-10-12, 79 y.o.   MRN: [id number]  HPI Here for a f/u for renal insuff   Lab Results  Component Value Date   CREATININE 1.4* 04/11/2013   GFR is 38.8 No change in urination / nausea or other symptoms   Adv to hold her maxzide entirely for this  BP: 140/78 mmHg   This is up a bit and wt is up 4 lb with bmi of 33 Edema noted -and pt is uncomfortable with that  She has not tried supp hose  She does not add salt to food  Shoes do not fit well today  No meds otc and no nsaids  She has been trying to drink water   ua today -no urine symptoms at all except her baseline incontinence   Sugar is ok  Sister has a kidney problem- she goes to a kidney doctor (sounds DM related)     Patient Active Problem List   Diagnosis Date Noted  . Encounter for Medicare annual wellness exam 04/11/2013  . Colon cancer screening 02/10/2012  . BRADYCARDIA 05/19/2010  . OSTEOPENIA 01/30/2009  . CONSTIPATION 11/01/2007  . ANXIETY STATE NEC 02/14/2007  . REACTION, ACUTE STRESS W/EMOTIONAL DSTURB 02/14/2007  . DISORDER, DEPRESSIVE NEC 02/14/2007  . HYPERLIPIDEMIA 02/04/2007  . GOUT 02/04/2007  . HYPERTENSION 02/04/2007  . DIVERTICULOSIS, COLON 02/04/2007  . RENAL INSUFFICIENCY 02/04/2007  . OSTEOARTHRITIS 02/04/2007  . DEGENERATIVE DISC DISEASE 02/04/2007  . LEG CRAMPS 02/04/2007   Past Medical History  Diagnosis Date  . History of diverticulitis of colon   . Diverticulosis of colon   . Gout   . HLD (hyperlipidemia)   . HTN (hypertension)   . OA (osteoarthritis)   . Osteopenia   . GERD (gastroesophageal reflux disease)     atypical chest pain  . Basal cell carcinoma     removed   Past Surgical History  Procedure Laterality Date  . Appendectomy    . Cataract extraction  04/2006  . Cholecystectomy    . Partial colectomy  09/1999  . Back surgery    . Neck surgery    . Knee surgery      Medial meniscus tear   History   Substance Use Topics  . Smoking status: Never Smoker   . Smokeless tobacco: Not on file  . Alcohol Use: No   Family History  Problem Relation Age of Onset  . Cancer Father     throat and bladder  . Diabetes Father   . Hypertension Father   . Heart attack Father   . Stroke Mother   . Alzheimer's disease Mother   . Hypertension Mother    Allergies  Allergen Reactions  . Ace Inhibitors     REACTION: increased K+, increased creat.  . Cephalosporins     REACTION: reaction not known  . Colesevelam     REACTION: constipation  . Lovastatin     REACTION: doesn't work  . Raloxifene     REACTION: cramps  . Rosuvastatin     REACTION: myalgia  . Simvastatin     REACTION: myalgia   Current Outpatient Prescriptions on File Prior to Visit  Medication Sig Dispense Refill  . allopurinol (ZYLOPRIM) 100 MG tablet TAKE 1 TABLET DAILY  90 tablet  0  . amLODipine (NORVASC) 5 MG tablet TAKE 1 TABLET DAILY IN THE MORNING  90 tablet  0  . busPIRone (BUSPAR) 15 MG tablet TAKE  1 TABLET THREE TIMES A DAY  90 tablet  0  . Calcium Carbonate-Vitamin D (CALCIUM + D PO) Take 1 tablet by mouth daily.      . Cranberry 250 MG CAPS Take 1 capsule by mouth daily.        Marland Kitchen docusate sodium (COLACE) 100 MG capsule Take 200 mg by mouth daily.        . fish oil-omega-3 fatty acids 1000 MG capsule Take 1 g by mouth 2 (two) times daily.       . meclizine (ANTIVERT) 25 MG tablet Take 25 mg by mouth 4 (four) times daily as needed.        . metoprolol succinate (TOPROL-XL) 50 MG 24 hr tablet TAKE 1 TABLET TWICE A DAY  180 tablet  0  . polycarbophil (FIBERCON) 625 MG tablet Take 625 mg by mouth daily as needed.        . pravastatin (PRAVACHOL) 20 MG tablet TAKE ONE-HALF (1/2) TO 1 TABLET (10 - 20 MG TOTAL) DAILY  90 tablet  3   No current facility-administered medications on file prior to visit.    Review of Systems Review of Systems  Constitutional: Negative for fever, appetite change, fatigue and unexpected  weight change.  Eyes: Negative for pain and visual disturbance.  Respiratory: Negative for cough and shortness of breath.   Cardiovascular: Negative for cp or palpitations pos for mild pedal edema , neg for PND / orthopnea/ sob on exertion    Gastrointestinal: Negative for nausea, diarrhea and constipation.  Genitourinary: Negative for urgency and frequency.  Skin: Negative for pallor or rash   Neurological: Negative for weakness, light-headedness, numbness and headaches.  Hematological: Negative for adenopathy. Does not bruise/bleed easily.  Psychiatric/Behavioral: Negative for dysphoric mood. The patient is not nervous/anxious.         Objective:   Physical Exam  Constitutional: She appears well-developed and well-nourished. No distress.  obese and well appearing   HENT:  Head: Normocephalic and atraumatic.  Mouth/Throat: Oropharynx is clear and moist.  Eyes: Conjunctivae and EOM are normal. Pupils are equal, round, and reactive to light. No scleral icterus.  Neck: Normal range of motion. Neck supple. Carotid bruit is not present. No thyromegaly present.  Cardiovascular: Normal rate, regular rhythm, normal heart sounds and intact distal pulses.  Exam reveals no gallop.   Pulmonary/Chest: Effort normal and breath sounds normal. No respiratory distress. She has no wheezes. She has no rales.  Abdominal: Soft. Bowel sounds are normal. She exhibits no distension, no abdominal bruit and no mass. There is tenderness in the suprapubic area. There is no rebound, no guarding and no CVA tenderness.  Musculoskeletal: She exhibits edema.  Trace pedal edema   Lymphadenopathy:    She has no cervical adenopathy.  Neurological: She is alert. She has normal reflexes.  Skin: Skin is warm and dry. No rash noted. No pallor.  Psychiatric: She has a normal mood and affect.          Assessment & Plan:

## 2013-04-26 NOTE — Assessment & Plan Note (Signed)
ua is positive today- and pt is totally asymptomatic but cr is up to 1.4  Will cx before choosing agent in effort to use the least nephro toxic agent possible  Adv to drink water and update if she develops any symptoms in the meantime

## 2013-04-26 NOTE — Assessment & Plan Note (Signed)
bp is up a bit off maxzide as expected  May change to lasix if bp rises more (disc low sodium diet) - for now will use prn for edema  Will continue to follow this and renal insuff

## 2013-04-26 NOTE — Assessment & Plan Note (Signed)
Worse off maxzide as expected but not severe  Gave pt px for lasix to use as needed for edema -and depending on f/u and bp may make it daily Will continue to follow Disc low sodium diet and leg elevation

## 2013-04-26 NOTE — Patient Instructions (Addendum)
Drink your water Avoid salty foods  Pending urine culture to treat suspected uti Stay off the maxzide  If you need the lasix (furosemide) for swelling-take only if needed  We will make a plan when urine culture returne

## 2013-04-28 LAB — URINE CULTURE: Colony Count: 50000

## 2013-05-01 ENCOUNTER — Encounter: Payer: Self-pay | Admitting: Family Medicine

## 2013-05-03 ENCOUNTER — Encounter: Payer: Self-pay | Admitting: Family Medicine

## 2013-05-06 ENCOUNTER — Encounter: Payer: Self-pay | Admitting: Family Medicine

## 2013-05-10 ENCOUNTER — Telehealth: Payer: Self-pay | Admitting: Family Medicine

## 2013-05-10 MED ORDER — LEVOFLOXACIN 250 MG PO TABS: 250.0000 mg | ORAL_TABLET | Freq: Every day | ORAL | Status: DC

## 2013-05-10 NOTE — Telephone Encounter (Signed)
Sending levaquin to [facility] for uti- renal dose

## 2013-06-16 ENCOUNTER — Other Ambulatory Visit: Payer: Self-pay | Admitting: Family Medicine

## 2013-06-17 ENCOUNTER — Other Ambulatory Visit: Payer: Self-pay | Admitting: Family Medicine

## 2013-06-20 ENCOUNTER — Other Ambulatory Visit: Payer: Self-pay | Admitting: Family Medicine

## 2013-07-21 ENCOUNTER — Encounter: Payer: Self-pay | Admitting: Family Medicine

## 2013-07-21 ENCOUNTER — Ambulatory Visit (INDEPENDENT_AMBULATORY_CARE_PROVIDER_SITE_OTHER): Payer: Medicare Other | Admitting: Family Medicine

## 2013-07-21 VITALS — BP 122/70 | HR 47 | Temp 98.6°F | Ht 61.75 in | Wt 172.5 lb

## 2013-07-21 DIAGNOSIS — F418 Other specified anxiety disorders: Secondary | ICD-10-CM

## 2013-07-21 DIAGNOSIS — F341 Dysthymic disorder: Secondary | ICD-10-CM

## 2013-07-21 MED ORDER — SERTRALINE HCL 50 MG PO TABS: 50.0000 mg | ORAL_TABLET | Freq: Every day | ORAL | Status: DC

## 2013-07-21 NOTE — Patient Instructions (Signed)
Stop up front for referral to a counselor  Cut buspar to 1/2 pill twice daily for 2 weeks then stop it  Start zoloft 1/2 pill in evening for 2 weeks then increase to a whole pill each evening  If side effect or if you feel worse let me know  Follow up with me in 4-6 weeks
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
## 2013-07-23 NOTE — Assessment & Plan Note (Addendum)
Reviewed stressors/ coping techniques/symptoms/ support sources/ tx options and side effects in detail today  Disc stressors and ways to distance herself  Ref to counseling  Will transition from buspar to zoloft to help depressive symptoms better Rev warnings with this med-if worse or SI will stop it  F/u planned  >25 min spent with face to face with patient, >50% counseling and/or coordinating care

## 2013-07-24 ENCOUNTER — Encounter: Payer: Self-pay | Admitting: Family Medicine

## 2013-07-24 ENCOUNTER — Ambulatory Visit (INDEPENDENT_AMBULATORY_CARE_PROVIDER_SITE_OTHER): Payer: Medicare Other | Admitting: Family Medicine

## 2013-07-24 ENCOUNTER — Telehealth: Payer: Self-pay

## 2013-07-24 VITALS — BP 124/86 | HR 51 | Temp 98.0°F | Ht 61.75 in | Wt 170.8 lb

## 2013-07-24 DIAGNOSIS — I498 Other specified cardiac arrhythmias: Secondary | ICD-10-CM

## 2013-07-24 DIAGNOSIS — M5441 Lumbago with sciatica, right side: Secondary | ICD-10-CM | POA: Insufficient documentation

## 2013-07-24 DIAGNOSIS — M5430 Sciatica, unspecified side: Secondary | ICD-10-CM

## 2013-07-24 MED ORDER — MELOXICAM 15 MG PO TABS: 15.0000 mg | ORAL_TABLET | Freq: Every day | ORAL | Status: DC

## 2013-07-24 MED ORDER — CYCLOBENZAPRINE HCL 10 MG PO TABS: 10.0000 mg | ORAL_TABLET | Freq: Every evening | ORAL | Status: DC | PRN

## 2013-07-24 NOTE — Telephone Encounter (Signed)
Pt said 3 days ago began with rt hip and leg pain; no known injury. Pain worsening. Pt scheduled appt today with Dr Milinda Antis at 9:15 am.

## 2013-07-24 NOTE — Telephone Encounter (Signed)
Pt was seen

## 2013-07-24 NOTE — Patient Instructions (Signed)
Cut metoprolol to one pill daily and see if this helps with weak feeling  Take meloxicam 15 mg once daily with food for pain  Flexeril - at night if needed- for muscle relaxer Heat as needed Update in 2-3 days if not improved or if worse at any time

## 2013-07-24 NOTE — Progress Notes (Signed)
Subjective:    Patient ID: Linda Robinson, female    DOB: 1929-03-16, 79 y.o.   MRN: [id number]  HPI Here with back pain  And medicine side effect  Started Thursday night - has hurt ever since  Low back/ R side - radiates down the leg all the way to the foot   No injury  Has had one surgery on her low back -- ? What they did (not a fusion)  Pain is constant - a dull ache/ no tingling  No numb or weakness and no foot drop  No sudden loss of b/b control  Tried heat and asper creme  Heating pad   Takes 2 tylenol pm at night   Also feels "weak" from low HR On metoprolol 50 xl 2 pills daily No cp or palp No edema  Patient Active Problem List   Diagnosis Date Noted  . Edema 04/26/2013  . UTI (urinary tract infection) 04/26/2013  . Encounter for Medicare annual wellness exam 04/11/2013  . Colon cancer screening 02/10/2012  . BRADYCARDIA 05/19/2010  . OSTEOPENIA 01/30/2009  . CONSTIPATION 11/01/2007  . ANXIETY STATE NEC 02/14/2007  . REACTION, ACUTE STRESS W/EMOTIONAL DSTURB 02/14/2007  . Depression with anxiety 02/14/2007  . HYPERLIPIDEMIA 02/04/2007  . GOUT 02/04/2007  . HYPERTENSION 02/04/2007  . DIVERTICULOSIS, COLON 02/04/2007  . RENAL INSUFFICIENCY 02/04/2007  . OSTEOARTHRITIS 02/04/2007  . DEGENERATIVE DISC DISEASE 02/04/2007  . LEG CRAMPS 02/04/2007   Past Medical History  Diagnosis Date  . History of diverticulitis of colon   . Diverticulosis of colon   . Gout   . HLD (hyperlipidemia)   . HTN (hypertension)   . OA (osteoarthritis)   . Osteopenia   . GERD (gastroesophageal reflux disease)     atypical chest pain  . Basal cell carcinoma     removed   Past Surgical History  Procedure Laterality Date  . Appendectomy    . Cataract extraction  04/2006  . Cholecystectomy    . Partial colectomy  09/1999  . Back surgery    . Neck surgery    . Knee surgery      Medial meniscus tear   History  Substance Use Topics  . Smoking status: Never Smoker   .  Smokeless tobacco: Not on file  . Alcohol Use: No   Family History  Problem Relation Age of Onset  . Cancer Father     throat and bladder  . Diabetes Father   . Hypertension Father   . Heart attack Father   . Stroke Mother   . Alzheimer's disease Mother   . Hypertension Mother    Allergies  Allergen Reactions  . Ace Inhibitors     REACTION: increased K+, increased creat.  . Cephalosporins     REACTION: reaction not known  . Colesevelam     REACTION: constipation  . Lovastatin     REACTION: doesn't work  . Raloxifene     REACTION: cramps  . Rosuvastatin     REACTION: myalgia  . Simvastatin     REACTION: myalgia   Current Outpatient Prescriptions on File Prior to Visit  Medication Sig Dispense Refill  . allopurinol (ZYLOPRIM) 100 MG tablet TAKE 1 TABLET DAILY  90 tablet  1  . amLODipine (NORVASC) 5 MG tablet TAKE 1 TABLET EVERY MORNING  90 tablet  1  . Calcium Carbonate-Vitamin D (CALCIUM + D PO) Take 1 tablet by mouth daily.      . Cranberry 250 MG CAPS Take  1 capsule by mouth daily.        Marland Kitchen docusate sodium (COLACE) 100 MG capsule Take 200 mg by mouth daily.        . fish oil-omega-3 fatty acids 1000 MG capsule Take 1 g by mouth 2 (two) times daily.       . furosemide (LASIX) 20 MG tablet Take 1 pill by mouth once daily as needed for swelling  30 tablet  1  . meclizine (ANTIVERT) 25 MG tablet Take 25 mg by mouth 4 (four) times daily as needed.        . metoprolol succinate (TOPROL-XL) 50 MG 24 hr tablet TAKE 1 TABLET TWICE A DAY  180 tablet  1  . polycarbophil (FIBERCON) 625 MG tablet Take 625 mg by mouth daily as needed.        . pravastatin (PRAVACHOL) 20 MG tablet TAKE ONE-HALF (1/2) TO 1 TABLET (10 - 20 MG TOTAL) DAILY  90 tablet  3  . sertraline (ZOLOFT) 50 MG tablet Take 1 tablet (50 mg total) by mouth daily.  30 tablet  3   No current facility-administered medications on file prior to visit.    Review of Systems Review of Systems  Constitutional: Negative for  fever, appetite change, fatigue and unexpected weight change.  Eyes: Negative for pain and visual disturbance.  Respiratory: Negative for cough and shortness of breath.   Cardiovascular: Negative for cp or palpitations    Gastrointestinal: Negative for nausea, diarrhea and constipation.  Genitourinary: Negative for urgency and frequency.  Skin: Negative for pallor or rash  MSK pos for low back and leg pain , neg for joint swelling   Neurological: Negative for weakness, light-headedness, numbness and headaches.  Hematological: Negative for adenopathy. Does not bruise/bleed easily.  Psychiatric/Behavioral: Negative for dysphoric mood. The patient is not nervous/anxious.         Objective:   Physical Exam  Constitutional: She appears well-developed and well-nourished. No distress.  obese and well appearing   HENT:  Head: Normocephalic and atraumatic.  Neck: Normal range of motion. Neck supple.  No bony tenderness  Cardiovascular: Normal rate and regular rhythm.   Pulmonary/Chest: Effort normal and breath sounds normal.  Musculoskeletal: She exhibits tenderness. She exhibits no edema.       Lumbar back: She exhibits decreased range of motion, tenderness, bony tenderness and spasm. She exhibits no swelling and no deformity.  Tender over L5-S1 Flex 30 deg/ ext 10 deg Pain to lat flex R Pos SLR for R thigh pain  No neuro changes   Lymphadenopathy:    She has no cervical adenopathy.  Neurological: She is alert. She has normal reflexes. She displays no atrophy. No sensory deficit. She exhibits normal muscle tone. Gait normal.  Skin: Skin is warm and dry. No rash noted. No erythema. No pallor.  Psychiatric: She has a normal mood and affect.          Assessment & Plan:

## 2013-07-24 NOTE — Assessment & Plan Note (Signed)
Hx of deg disk dz and surg in the past  Radiculopathy- no neuro changes nsaid-meloxicam with food qd prn  Flexeril qhs  If no imp in 2-3 d consider imaging and PT

## 2013-07-24 NOTE — Assessment & Plan Note (Signed)
Will cut her metoprolol xl to 50 mg daily from 100  Update if not improved with this

## 2013-07-28 ENCOUNTER — Ambulatory Visit (INDEPENDENT_AMBULATORY_CARE_PROVIDER_SITE_OTHER)
Admission: RE | Admit: 2013-07-28 | Discharge: 2013-07-28 | Disposition: A | Discharge status: Home / Self Care | Payer: Medicare Other | Source: Ambulatory Visit | Attending: Family Medicine | Admitting: Family Medicine

## 2013-07-28 ENCOUNTER — Telehealth: Payer: Self-pay

## 2013-07-28 DIAGNOSIS — M5430 Sciatica, unspecified side: Secondary | ICD-10-CM

## 2013-07-28 DIAGNOSIS — M5441 Lumbago with sciatica, right side: Secondary | ICD-10-CM

## 2013-07-28 MED ORDER — HYDROCODONE-ACETAMINOPHEN 5-325 MG PO TABS: 0.5000 | ORAL_TABLET | Freq: Three times a day (TID) | ORAL | Status: DC | PRN

## 2013-07-28 NOTE — Telephone Encounter (Signed)
Pt was seen on 07/24/13 with lower rt back pain going down rt leg. Pt said still having dull back and rt leg ache; is worse when lays down at night; pt is not sleeping well due to ache. Pt said no numbness or weakness in rt leg. Meloxicam and Flexeril may be helping slightly but not taking care of pain.[place],Dr [employer] may not be on computer.Please advise.

## 2013-07-28 NOTE — Telephone Encounter (Signed)
Patient notified. She will come in today for xray. Rx placed up front for pick up.

## 2013-07-28 NOTE — Telephone Encounter (Signed)
I would suggest she come in for lumbar spine xray today given no improvement. May also use stronger pain medication - vicodin - for breakthrough pain but start with 1/2 tablet and I want to make sure someone is at home with her if she takes this. Script printed and placed in Kim's box. Wt Readings from Last 3 Encounters:  07/24/13 170 lb 12 oz (77.452 kg)  07/21/13 172 lb 8 oz (78.245 kg)  04/26/13 181 lb 8 oz (82.328 kg)

## 2013-08-02 ENCOUNTER — Encounter: Payer: Self-pay | Admitting: Family Medicine

## 2013-08-02 ENCOUNTER — Ambulatory Visit (INDEPENDENT_AMBULATORY_CARE_PROVIDER_SITE_OTHER): Payer: Medicare Other | Admitting: Family Medicine

## 2013-08-02 VITALS — BP 154/78 | HR 57 | Temp 98.5°F | Ht 61.75 in | Wt 171.0 lb

## 2013-08-02 DIAGNOSIS — M549 Dorsalgia, unspecified: Secondary | ICD-10-CM

## 2013-08-02 DIAGNOSIS — M5441 Lumbago with sciatica, right side: Secondary | ICD-10-CM

## 2013-08-02 DIAGNOSIS — M5430 Sciatica, unspecified side: Secondary | ICD-10-CM

## 2013-08-02 LAB — POCT URINALYSIS DIPSTICK
Ketones, UA: NEGATIVE
Nitrite, UA: NEGATIVE
pH, UA: 6

## 2013-08-02 NOTE — Progress Notes (Signed)
Subjective:    Patient ID: Linda Robinson, female    DOB: 1929/09/24, 79 y.o.   MRN: [id number]  HPI Here for f/u of R sided back pain   Overall not much better - now the L side of her back is starting to bother her  Her pain is not radiating down the R leg like it was  Pain is just bad enough to be aggrivating (not severe) Medicines have helped  Taking flexeril- at bedtime and does ok with it  Takes the meloxicam   Feels weak all over  Also dizziness is worse than usual ? Related to her medicines   Deg changes seen on xray - as expected She is ready to move forward in eval - would be open to injection if app, but not eager to have surgery again   Patient Active Problem List   Diagnosis Date Noted  . Low back pain on right side with sciatica 07/24/2013  . Edema 04/26/2013  . UTI (urinary tract infection) 04/26/2013  . Encounter for Medicare annual wellness exam 04/11/2013  . Colon cancer screening 02/10/2012  . BRADYCARDIA 05/19/2010  . OSTEOPENIA 01/30/2009  . CONSTIPATION 11/01/2007  . ANXIETY STATE NEC 02/14/2007  . REACTION, ACUTE STRESS W/EMOTIONAL DSTURB 02/14/2007  . Depression with anxiety 02/14/2007  . HYPERLIPIDEMIA 02/04/2007  . GOUT 02/04/2007  . HYPERTENSION 02/04/2007  . DIVERTICULOSIS, COLON 02/04/2007  . RENAL INSUFFICIENCY 02/04/2007  . OSTEOARTHRITIS 02/04/2007  . DEGENERATIVE DISC DISEASE 02/04/2007  . LEG CRAMPS 02/04/2007   Past Medical History  Diagnosis Date  . History of diverticulitis of colon   . Diverticulosis of colon   . Gout   . HLD (hyperlipidemia)   . HTN (hypertension)   . OA (osteoarthritis)   . Osteopenia   . GERD (gastroesophageal reflux disease)     atypical chest pain  . Basal cell carcinoma     removed   Past Surgical History  Procedure Laterality Date  . Appendectomy    . Cataract extraction  04/2006  . Cholecystectomy    . Partial colectomy  09/1999  . Back surgery    . Neck surgery    . Knee surgery      Medial  meniscus tear   History  Substance Use Topics  . Smoking status: Never Smoker   . Smokeless tobacco: Not on file  . Alcohol Use: No   Family History  Problem Relation Age of Onset  . Cancer Father     throat and bladder  . Diabetes Father   . Hypertension Father   . Heart attack Father   . Stroke Mother   . Alzheimer's disease Mother   . Hypertension Mother    Allergies  Allergen Reactions  . Ace Inhibitors     REACTION: increased K+, increased creat.  . Cephalosporins     REACTION: reaction not known  . Colesevelam     REACTION: constipation  . Lovastatin     REACTION: doesn't work  . Raloxifene     REACTION: cramps  . Rosuvastatin     REACTION: myalgia  . Simvastatin     REACTION: myalgia   Current Outpatient Prescriptions on File Prior to Visit  Medication Sig Dispense Refill  . allopurinol (ZYLOPRIM) 100 MG tablet TAKE 1 TABLET DAILY  90 tablet  1  . amLODipine (NORVASC) 5 MG tablet TAKE 1 TABLET EVERY MORNING  90 tablet  1  . Calcium Carbonate-Vitamin D (CALCIUM + D PO) Take 1 tablet by mouth  daily.      . Cranberry 250 MG CAPS Take 1 capsule by mouth daily.        . cyclobenzaprine (FLEXERIL) 10 MG tablet Take 1 tablet (10 mg total) by mouth at bedtime as needed for muscle spasms.  30 tablet  0  . docusate sodium (COLACE) 100 MG capsule Take 200 mg by mouth daily.        . fish oil-omega-3 fatty acids 1000 MG capsule Take 1 g by mouth 2 (two) times daily.       . furosemide (LASIX) 20 MG tablet Take 1 pill by mouth once daily as needed for swelling  30 tablet  1  . HYDROcodone-acetaminophen (NORCO/VICODIN) 5-325 MG per tablet Take 0.5-1 tablets by mouth every 8 (eight) hours as needed for pain.  20 tablet  0  . meclizine (ANTIVERT) 25 MG tablet Take 25 mg by mouth 4 (four) times daily as needed.        . meloxicam (MOBIC) 15 MG tablet Take 1 tablet (15 mg total) by mouth daily. With food  30 tablet  0  . metoprolol succinate (TOPROL-XL) 50 MG 24 hr tablet take 1  tablet once daily      . polycarbophil (FIBERCON) 625 MG tablet Take 625 mg by mouth daily as needed.        . pravastatin (PRAVACHOL) 20 MG tablet TAKE ONE-HALF (1/2) TO 1 TABLET (10 - 20 MG TOTAL) DAILY  90 tablet  3  . sertraline (ZOLOFT) 50 MG tablet Take 1 tablet (50 mg total) by mouth daily.  30 tablet  3   No current facility-administered medications on file prior to visit.    Review of Systems Review of Systems  Constitutional: Negative for fever, appetite change, fatigue and unexpected weight change.  Eyes: Negative for pain and visual disturbance.  Respiratory: Negative for cough and shortness of breath.   Cardiovascular: Negative for cp or palpitations    Gastrointestinal: Negative for nausea, diarrhea and constipation.  Genitourinary: Negative for urgency and frequency.  Skin: Negative for pallor or rash   MSK pos for back and leg pain  Neurological: Negative for weakness, light-headedness, numbness and headaches.  Hematological: Negative for adenopathy. Does not bruise/bleed easily.  Psychiatric/Behavioral: Negative for dysphoric mood. The patient is not nervous/anxious.         Objective:   Physical Exam  Constitutional: She appears well-developed and well-nourished. No distress.  obese and well appearing   HENT:  Head: Normocephalic and atraumatic.  Mouth/Throat: Oropharynx is clear and moist.  Eyes: Conjunctivae and EOM are normal. Pupils are equal, round, and reactive to light. No scleral icterus.  Neck: Normal range of motion. Neck supple.  Cardiovascular: Normal rate and regular rhythm.   Pulmonary/Chest: Effort normal and breath sounds normal.  Musculoskeletal:       Lumbar back: She exhibits decreased range of motion, tenderness, bony tenderness and spasm. She exhibits no edema and no deformity.  Tender over L1-L3 and L5-S1 Also mild R para lumbar tenderness Pos SLR R for some thigh pain  Limited flex and ext Labored gait   R toe dorsiflexion is slt  diminished but no foot drop appreciated   Neurological: She is alert. She has normal reflexes. She displays no atrophy. No sensory deficit. She exhibits normal muscle tone.  Skin: Skin is warm and dry. No rash noted.  Psychiatric: She has a normal mood and affect.          Assessment & Plan:

## 2013-08-02 NOTE — Progress Notes (Signed)
Pre-visit discussion using our clinic review tool. No additional management support is needed unless otherwise documented below in the visit note.  

## 2013-08-02 NOTE — Patient Instructions (Signed)
Keep using heat-intermittently  Use medicines with caution  If dizzy- use a walker  We will refer you to orthopedics at check out

## 2013-08-03 LAB — POCT UA - MICROSCOPIC ONLY
Bacteria, U Microscopic: 0
Casts, Ur, LPF, POC: 0
Yeast, UA: 0

## 2013-08-03 NOTE — Assessment & Plan Note (Signed)
Not much improved Evidence of deg disc and facet change on her xray- as expected  2 surg in past  ? If inj would be option Ref to ortho  Adv to use walker if medicines make her dizzy and goal is to get her off medication as soon as possible

## 2013-08-10 ENCOUNTER — Ambulatory Visit: Payer: Medicare Other | Admitting: Psychology

## 2013-08-11 ENCOUNTER — Encounter: Payer: Self-pay | Admitting: Family Medicine

## 2013-08-14 MED ORDER — PRAVASTATIN SODIUM 20 MG PO TABS: ORAL_TABLET | ORAL | Status: DC

## 2013-08-25 ENCOUNTER — Ambulatory Visit: Payer: Medicare Other | Admitting: Family Medicine

## 2013-09-13 ENCOUNTER — Telehealth: Payer: Self-pay

## 2013-09-13 NOTE — Telephone Encounter (Signed)
Follow up when able - if ha/ cp/ sob or dizziness please update [place] - and let me know if she had a head CT scan after her fall (sounds like she did not likely) thanks

## 2013-09-13 NOTE — Telephone Encounter (Signed)
Pt scheduled a f/u appt with you on 09/15/13 at 3:45, pt advise if she develops any of the sxs listed in prev note to let us know. Pt did advise me that they didn't to a CT or any type of scanning of her head when she went to the [facility] after her [facility]

## 2013-09-13 NOTE — Telephone Encounter (Signed)
Pt has been having more back and leg pain; seeing orthopedic dr. Pt said end of Oct 2014 Dr Milinda Antis decreased metoprolol XL to 50 mg daily; pt also taking amlodipine 5 mg daily. Today BP 182/68. No h/a,CP,SOB or dizziness now; pt was dizzy last night for 2 hours. Pt also fell on 09/10/13 and hit head and was seen at [facility] in [place]. Pt wanted to know if should increase BP med. Pt request cb. [place]. If pt condition changes or worsens prior to cb pt will call office.

## 2013-09-15 ENCOUNTER — Ambulatory Visit (INDEPENDENT_AMBULATORY_CARE_PROVIDER_SITE_OTHER): Payer: Medicare Other | Admitting: Family Medicine

## 2013-09-15 ENCOUNTER — Encounter: Payer: Self-pay | Admitting: Family Medicine

## 2013-09-15 VITALS — BP 140/80 | HR 61 | Temp 98.4°F | Ht 61.75 in | Wt 168.2 lb

## 2013-09-15 DIAGNOSIS — W108XXA Fall (on) (from) other stairs and steps, initial encounter: Secondary | ICD-10-CM

## 2013-09-15 DIAGNOSIS — N259 Disorder resulting from impaired renal tubular function, unspecified: Secondary | ICD-10-CM

## 2013-09-15 DIAGNOSIS — W102XXA Fall (on)(from) incline, initial encounter: Secondary | ICD-10-CM

## 2013-09-15 DIAGNOSIS — I10 Essential (primary) hypertension: Secondary | ICD-10-CM

## 2013-09-15 LAB — RENAL FUNCTION PANEL
Albumin: 4.1 g/dL (ref 3.5–5.2)
BUN: 24 mg/dL — ABNORMAL HIGH (ref 6–23)
CO2: 28 mEq/L (ref 19–32)
Calcium: 9.5 mg/dL (ref 8.4–10.5)
Chloride: 102 mEq/L (ref 96–112)
Creat: 0.9 mg/dL (ref 0.50–1.10)
Phosphorus: 3 mg/dL (ref 2.3–4.6)

## 2013-09-15 NOTE — Patient Instructions (Signed)
Blood pressure looks good today Always check it at home when relaxed  Use great caution and get help on stairs  Use walker or cane at all times Keep doing physical therapy Labs today  If you develop headache/ nausea / vision change/ dizziness -let me know -those can be late effects of a head injury

## 2013-09-15 NOTE — Progress Notes (Signed)
Pre-visit discussion using our clinic review tool. No additional management support is needed unless otherwise documented below in the visit note.  

## 2013-09-15 NOTE — Progress Notes (Signed)
Subjective:    Patient ID: Linda Robinson, female    DOB: 1928/11/05, 79 y.o.   MRN: [id number]  HPI Here for several issues   Saw orthopedics Shot in back helped  Shot in knee did not help yet (from Monday)  In PT right now - working hard on knee / leg strength  Had a fall on 12/14- sunday She went down the steps and her R knee went out on her- she hit her head on the cement  No LOC and not much pain  Went to urgent care - got checked out / examined  Knot on her head and R eye is black and blue  Never got ha or dizziness- has done fine with it   She thinks the PT will help strengthen legs and prevent falls  Also does daily exercises at home  Uses a cane all the time  Has a walker she does not use in the attic Her family discussed building a ramp for her to avoid steps (and no rail on front porch)   Thinks her balance is fairly good overall   bp is stable today   (at home when she was in a lot of pain - it was 180s systolic)  No cp or palpitations or headaches or edema  No side effects to medicines - metoprolol and amlodipine  Stopped all diuretics due to kidney issues  She is drinking a lot of water  Not a lot of swelling  BP Readings from Last 3 Encounters:  09/15/13 142/90  08/02/13 154/78  07/24/13 124/86       Chemistry      Component Value Date/Time   NA 140 04/11/2013 1154   K 4.5 04/11/2013 1154   CL 100 04/11/2013 1154   CO2 29 04/11/2013 1154   BUN 29* 04/11/2013 1154   CREATININE 1.4* 04/11/2013 1154      Component Value Date/Time   CALCIUM 9.9 04/11/2013 1154   ALKPHOS 52 04/11/2013 1154   AST 21 04/11/2013 1154   ALT 13 04/11/2013 1154   BILITOT 0.8 04/11/2013 1154       Has meloxicam px from ortho - (was not aware it can raise her bp) Using ice and heat on her knee   Patient Active Problem List   Diagnosis Date Noted  . Low back pain on right side with sciatica 07/24/2013  . Edema 04/26/2013  . UTI (urinary tract infection) 04/26/2013  .  Encounter for Medicare annual wellness exam 04/11/2013  . Colon cancer screening 02/10/2012  . BRADYCARDIA 05/19/2010  . OSTEOPENIA 01/30/2009  . CONSTIPATION 11/01/2007  . ANXIETY STATE NEC 02/14/2007  . REACTION, ACUTE STRESS W/EMOTIONAL DSTURB 02/14/2007  . Depression with anxiety 02/14/2007  . HYPERLIPIDEMIA 02/04/2007  . GOUT 02/04/2007  . HYPERTENSION 02/04/2007  . DIVERTICULOSIS, COLON 02/04/2007  . RENAL INSUFFICIENCY 02/04/2007  . OSTEOARTHRITIS 02/04/2007  . DEGENERATIVE DISC DISEASE 02/04/2007  . LEG CRAMPS 02/04/2007   Past Medical History  Diagnosis Date  . History of diverticulitis of colon   . Diverticulosis of colon   . Gout   . HLD (hyperlipidemia)   . HTN (hypertension)   . OA (osteoarthritis)   . Osteopenia   . GERD (gastroesophageal reflux disease)     atypical chest pain  . Basal cell carcinoma     removed   Past Surgical History  Procedure Laterality Date  . Appendectomy    . Cataract extraction  04/2006  . Cholecystectomy    .  Partial colectomy  09/1999  . Back surgery    . Neck surgery    . Knee surgery      Medial meniscus tear   History  Substance Use Topics  . Smoking status: Never Smoker   . Smokeless tobacco: Not on file  . Alcohol Use: No   Family History  Problem Relation Age of Onset  . Cancer Father     throat and bladder  . Diabetes Father   . Hypertension Father   . Heart attack Father   . Stroke Mother   . Alzheimer's disease Mother   . Hypertension Mother    Allergies  Allergen Reactions  . Ace Inhibitors     REACTION: increased K+, increased creat.  . Cephalosporins     REACTION: reaction not known  . Colesevelam     REACTION: constipation  . Lovastatin     REACTION: doesn't work  . Raloxifene     REACTION: cramps  . Rosuvastatin     REACTION: myalgia  . Simvastatin     REACTION: myalgia   Current Outpatient Prescriptions on File Prior to Visit  Medication Sig Dispense Refill  . allopurinol (ZYLOPRIM)  100 MG tablet TAKE 1 TABLET DAILY  90 tablet  1  . amLODipine (NORVASC) 5 MG tablet TAKE 1 TABLET EVERY MORNING  90 tablet  1  . Calcium Carbonate-Vitamin D (CALCIUM + D PO) Take 1 tablet by mouth daily.      . Cranberry 250 MG CAPS Take 1 capsule by mouth daily.        Marland Kitchen docusate sodium (COLACE) 100 MG capsule Take 200 mg by mouth daily.        . fish oil-omega-3 fatty acids 1000 MG capsule Take 1 g by mouth 2 (two) times daily.       . meclizine (ANTIVERT) 25 MG tablet Take 25 mg by mouth 4 (four) times daily as needed.        . meloxicam (MOBIC) 15 MG tablet Take 1 tablet (15 mg total) by mouth daily. With food  30 tablet  0  . metoprolol succinate (TOPROL-XL) 50 MG 24 hr tablet take 1 tablet once daily      . polycarbophil (FIBERCON) 625 MG tablet Take 625 mg by mouth daily as needed.        . pravastatin (PRAVACHOL) 20 MG tablet Take 1/2 to 1 tablet by mouth once daily  90 tablet  3   No current facility-administered medications on file prior to visit.     Review of Systems Review of Systems  Constitutional: Negative for fever, appetite change, fatigue and unexpected weight change.  Eyes: Negative for pain and visual disturbance.  Respiratory: Negative for cough and shortness of breath.   Cardiovascular: Negative for cp or palpitations    Gastrointestinal: Negative for nausea, diarrhea and constipation.  Genitourinary: Negative for urgency and frequency.  Skin: Negative for pallor or rash   MSK pos for knee pain and swelling  Neurological: Negative for weakness, light-headedness, numbness and headaches.  Hematological: Negative for adenopathy. Does not bruise/bleed easily.  Psychiatric/Behavioral: Negative for dysphoric mood. The patient is nervous/anxious from pain at this time         Objective:   Physical Exam  Constitutional: She appears well-developed and well-nourished. No distress.  HENT:  Head: Normocephalic.  Right Ear: External ear normal.  Left Ear: External ear  normal.  Mouth/Throat: Oropharynx is clear and moist.  2 by 2 cm nontender lump on scalp  above and lateral to R eye with ecchymosis around eye  No facial tenderness  Neg for hemotympanum    Eyes: Conjunctivae and EOM are normal. Pupils are equal, round, and reactive to light. Right eye exhibits no discharge. Left eye exhibits no discharge. No scleral icterus.  Neck: Normal range of motion. Neck supple.  Cardiovascular: Normal rate, regular rhythm, normal heart sounds and intact distal pulses.  Exam reveals no gallop.   Pulmonary/Chest: Effort normal and breath sounds normal. No respiratory distress. She has no wheezes.  No crackles   Abdominal: Soft. Bowel sounds are normal.  Musculoskeletal: She exhibits edema.  R knee is diffusely swollen    Lymphadenopathy:    She has no cervical adenopathy.  Neurological: She is alert. She has normal reflexes. No cranial nerve deficit. She exhibits normal muscle tone. Coordination normal.  Walks steadily with cane on the R side   Skin: Skin is warm and dry. No pallor.  Ecchymosis around R eye   Psychiatric: She has a normal mood and affect.          Assessment & Plan:

## 2013-09-17 NOTE — Assessment & Plan Note (Signed)
Fall due to knee pain on stairs Long disc re; fall prev   (pt thinks her balance is overall good) Disc need for cane/ or pref walker at all times Family will work on solution to eliminate need to use stairs  Pt is confident that with resolution /imp of knee problem and PT to strengthen leg- that fall risk will go way down

## 2013-09-17 NOTE — Assessment & Plan Note (Signed)
Lab today Off diuretics  bp stable today

## 2013-09-17 NOTE — Assessment & Plan Note (Signed)
bp is much improved today  BP Readings from Last 3 Encounters:  09/15/13 140/80  08/02/13 154/78  07/24/13 124/86    Her high bp at home may have correlated with pain and anxiety She will continue to monitor when more relaxed and pain is improved Will continue to follow Disc DASH diet as well

## 2013-11-26 ENCOUNTER — Other Ambulatory Visit: Payer: Self-pay | Admitting: Family Medicine

## 2013-12-11 ENCOUNTER — Other Ambulatory Visit: Payer: Self-pay | Admitting: *Deleted

## 2013-12-11 MED ORDER — MELOXICAM 15 MG PO TABS: 15.0000 mg | ORAL_TABLET | Freq: Every day | ORAL | Status: DC

## 2013-12-11 NOTE — Telephone Encounter (Signed)
Pt requesting medication refill. Last ov 07/2013 with no future appts scheduled. Last refill 06/2013. pls advise

## 2013-12-11 NOTE — Telephone Encounter (Signed)
Please refill for 6 months 

## 2014-01-01 ENCOUNTER — Other Ambulatory Visit: Payer: Self-pay | Admitting: Family Medicine

## 2014-01-01 MED ORDER — SERTRALINE HCL 50 MG PO TABS: 50.0000 mg | ORAL_TABLET | Freq: Every day | ORAL | Status: DC

## 2014-01-01 NOTE — Telephone Encounter (Signed)
Fax refill request, Dr. Glori Bickers out of town, please advise

## 2014-02-21 ENCOUNTER — Other Ambulatory Visit: Payer: Self-pay | Admitting: Family Medicine

## 2014-02-22 ENCOUNTER — Other Ambulatory Visit: Payer: Self-pay | Admitting: Family Medicine

## 2014-06-27 ENCOUNTER — Encounter: Payer: Self-pay | Admitting: Family Medicine

## 2014-06-27 ENCOUNTER — Ambulatory Visit (INDEPENDENT_AMBULATORY_CARE_PROVIDER_SITE_OTHER): Payer: Medicare Other | Admitting: Family Medicine

## 2014-06-27 VITALS — BP 130/68 | HR 52 | Temp 98.8°F | Ht 61.75 in | Wt 164.8 lb

## 2014-06-27 DIAGNOSIS — E785 Hyperlipidemia, unspecified: Secondary | ICD-10-CM

## 2014-06-27 DIAGNOSIS — M949 Disorder of cartilage, unspecified: Secondary | ICD-10-CM

## 2014-06-27 DIAGNOSIS — Z23 Encounter for immunization: Secondary | ICD-10-CM

## 2014-06-27 DIAGNOSIS — Z0000 Encounter for general adult medical examination without abnormal findings: Secondary | ICD-10-CM

## 2014-06-27 DIAGNOSIS — N259 Disorder resulting from impaired renal tubular function, unspecified: Secondary | ICD-10-CM

## 2014-06-27 DIAGNOSIS — I10 Essential (primary) hypertension: Secondary | ICD-10-CM

## 2014-06-27 DIAGNOSIS — Z9181 History of falling: Secondary | ICD-10-CM

## 2014-06-27 DIAGNOSIS — M899 Disorder of bone, unspecified: Secondary | ICD-10-CM

## 2014-06-27 DIAGNOSIS — Z1211 Encounter for screening for malignant neoplasm of colon: Secondary | ICD-10-CM

## 2014-06-27 LAB — CBC WITH DIFFERENTIAL/PLATELET
Basophils Absolute: 0 10*3/uL (ref 0.0–0.1)
Basophils Relative: 0.3 % (ref 0.0–3.0)
EOS PCT: 0.8 % (ref 0.0–5.0)
Eosinophils Absolute: 0.1 10*3/uL (ref 0.0–0.7)
HEMATOCRIT: 44.6 % (ref 36.0–46.0)
HEMOGLOBIN: 15.2 g/dL — AB (ref 12.0–15.0)
LYMPHS ABS: 4.7 10*3/uL — AB (ref 0.7–4.0)
Lymphocytes Relative: 36.2 % (ref 12.0–46.0)
MCHC: 34.1 g/dL (ref 30.0–36.0)
MCV: 89.6 fl (ref 78.0–100.0)
MONO ABS: 0.8 10*3/uL (ref 0.1–1.0)
MONOS PCT: 5.8 % (ref 3.0–12.0)
NEUTROS ABS: 7.4 10*3/uL (ref 1.4–7.7)
Neutrophils Relative %: 56.9 % (ref 43.0–77.0)
PLATELETS: 272 10*3/uL (ref 150.0–400.0)
RBC: 4.98 Mil/uL (ref 3.87–5.11)
RDW: 13.4 % (ref 11.5–15.5)
WBC: 13 10*3/uL — AB (ref 4.0–10.5)

## 2014-06-27 LAB — COMPREHENSIVE METABOLIC PANEL
ALT: 84 U/L — ABNORMAL HIGH (ref 0–35)
AST: 76 U/L — ABNORMAL HIGH (ref 0–37)
Albumin: 4.2 g/dL (ref 3.5–5.2)
Alkaline Phosphatase: 64 U/L (ref 39–117)
BILIRUBIN TOTAL: 1.3 mg/dL — AB (ref 0.2–1.2)
BUN: 31 mg/dL — ABNORMAL HIGH (ref 6–23)
CO2: 31 mEq/L (ref 19–32)
Calcium: 9.7 mg/dL (ref 8.4–10.5)
Chloride: 102 mEq/L (ref 96–112)
Creatinine, Ser: 1.1 mg/dL (ref 0.4–1.2)
GFR: 48.34 mL/min — AB (ref >60.00)
GLUCOSE: 98 mg/dL (ref 70–99)
Potassium: 4.5 mEq/L (ref 3.5–5.1)
SODIUM: 139 meq/L (ref 135–145)
TOTAL PROTEIN: 7.5 g/dL (ref 6.0–8.3)

## 2014-06-27 LAB — LIPID PANEL
Cholesterol: 260 mg/dL — ABNORMAL HIGH (ref 0–200)
HDL: 73 mg/dL (ref >39.00)
LDL CALC: 167 mg/dL — AB (ref 0–99)
NonHDL: 187
TRIGLYCERIDES: 99 mg/dL (ref 0.0–149.0)
Total CHOL/HDL Ratio: 4
VLDL: 19.8 mg/dL (ref 0.0–40.0)

## 2014-06-27 LAB — TSH: TSH: 1.54 u[IU]/mL (ref 0.35–4.50)

## 2014-06-27 LAB — VITAMIN D 25 HYDROXY (VIT D DEFICIENCY, FRACTURES): VITD: 50.88 ng/mL (ref 30.00–100.00)

## 2014-06-27 NOTE — Assessment & Plan Note (Signed)
Pt has spinal stenosis  Also acute L hip pain undergoing w/u  Fall in Jan Stressed imp of using walker instead of cane - (since pt c/o overall leg weakness) and explained why  May be a candidate for balance PT in the future

## 2014-06-27 NOTE — Assessment & Plan Note (Signed)
Lipid panel today  Has been controlled with simvastatin and diet Rev low sat fat diet

## 2014-06-27 NOTE — Assessment & Plan Note (Signed)
dexa 8/14 D level today Had a fall in Jan - upcoming MRI -likely no fx but looking for cause of some degeneration  Disc need for use of walker instead of a cane to prevent falls

## 2014-06-27 NOTE — Patient Instructions (Signed)
You have a high fall risk  Please use your walker instead of a cane  prevnar vaccine today  Don't forget to get a flu shot  Please work on an advance directive  Labs today

## 2014-06-27 NOTE — Assessment & Plan Note (Signed)
Pt declines any colon cancer screening at this time  °

## 2014-06-27 NOTE — Progress Notes (Signed)
Pre visit review using our clinic review tool, if applicable. No additional management support is needed unless otherwise documented below in the visit note. 

## 2014-06-27 NOTE — Assessment & Plan Note (Signed)
bp in fair control at this time  BP Readings from Last 1 Encounters:  06/27/14 130/68   No changes needed Disc lifstyle change with low sodium diet and exercise  Labs today

## 2014-06-27 NOTE — Progress Notes (Signed)
Subjective:    Patient ID: Linda Robinson, female    DOB: November 26, 1928, 80 y.o.   MRN: [id number]  HPI I have personally reviewed the Medicare Annual Wellness questionnaire and have noted 1. The patient's medical and social history 2. Their use of alcohol, tobacco or illicit drugs 3. Their current medications and supplements 4. The patient's functional ability including ADL's, fall risks, home safety risks and hearing or visual             impairment. 5. Diet and physical activities 6. Evidence for depression or mood disorders  The patients weight, height, BMI have been recorded in the chart and visual acuity is per eye clinic.  I have made referrals, counseling and provided education to the patient based review of the above and I have provided the pt with a written personalized care plan for preventive services.  Still having a terrible time with her back --has had  injections for spinal stenosis Then more pain in R hip area  Going to [place] now - has some deterioration of bone - ? If she had a fx she did not know about  Has MRI upcoming  Getting around with a cane  In Jan - did fall on some steps on the deck  No falls since   She will not use a walker  Says she is very careful   See scanned forms.  Routine anticipatory guidance given to patient.  See health maintenance. Colon cancer screening 9/00 - she is not interested in further colon cancer screening  Breast cancer screening 8/14- ok  Self breast exam- no lumps  Flu vaccine- she wants to get her flu shot with her husband at [facility]  Tetanus vaccine 5/13 Pneumovax 5/13  Zoster vaccine - declines the vaccine   Advance directive- does not have , she will look at the packet and look at that  Cognitive function addressed- see scanned forms- and if abnormal then additional documentation follows. - is sharp mentally/no problems   PMH and SH reviewed  Meds, vitals, and allergies reviewed.   ROS: See HPI.  Otherwise negative.      Wt is down 4 lb with bmi of 30  Osteopenia  8/14 dexa improved  She is taking vitamin D  No fractures that she knows of but has hip MRI upcomig    Due for labs  bp is stable today  No cp or palpitations or headaches or edema  No side effects to medicines  BP Readings from Last 3 Encounters:  06/27/14 130/68  09/15/13 140/80  08/02/13 154/78      Patient Active Problem List   Diagnosis Date Noted  . Fall (on)(from) incline, initial encounter 09/15/2013  . Low back pain on right side with sciatica 07/24/2013  . Edema 04/26/2013  . UTI (urinary tract infection) 04/26/2013  . Encounter for Medicare annual wellness exam 04/11/2013  . Colon cancer screening 02/10/2012  . BRADYCARDIA 05/19/2010  . OSTEOPENIA 01/30/2009  . CONSTIPATION 11/01/2007  . ANXIETY STATE NEC 02/14/2007  . REACTION, ACUTE STRESS W/EMOTIONAL DSTURB 02/14/2007  . Depression with anxiety 02/14/2007  . HYPERLIPIDEMIA 02/04/2007  . GOUT 02/04/2007  . HYPERTENSION 02/04/2007  . DIVERTICULOSIS, COLON 02/04/2007  . RENAL INSUFFICIENCY 02/04/2007  . OSTEOARTHRITIS 02/04/2007  . [employer] DISEASE 02/04/2007  . LEG CRAMPS 02/04/2007   Past Medical History  Diagnosis Date  . History of diverticulitis of colon   . Diverticulosis of colon   . Gout   . HLD (  hyperlipidemia)   . HTN (hypertension)   . OA (osteoarthritis)   . Osteopenia   . GERD (gastroesophageal reflux disease)     atypical chest pain  . Basal cell carcinoma     removed   Past Surgical History  Procedure Laterality Date  . Appendectomy    . Cataract extraction  04/2006  . Cholecystectomy    . Partial colectomy  09/1999  . Back surgery    . Neck surgery    . Knee surgery      Medial meniscus tear   History  Substance Use Topics  . Smoking status: Never Smoker   . Smokeless tobacco: Not on file  . Alcohol Use: No   Family History  Problem Relation Age of Onset  . Cancer Father     throat and bladder  . Diabetes  Father   . Hypertension Father   . Heart attack Father   . Stroke Mother   . Alzheimer's disease Mother   . Hypertension Mother    Allergies  Allergen Reactions  . Ace Inhibitors     REACTION: increased K+, increased creat.  . Cephalosporins     REACTION: reaction not known  . Colesevelam     REACTION: constipation  . Lovastatin     REACTION: doesn't work  . Raloxifene     REACTION: cramps  . Rosuvastatin     REACTION: myalgia  . Simvastatin     REACTION: myalgia   Current Outpatient Prescriptions on File Prior to Visit  Medication Sig Dispense Refill  . allopurinol (ZYLOPRIM) 100 MG tablet TAKE 1 TABLET DAILY  90 tablet  1  . amLODipine (NORVASC) 5 MG tablet TAKE 1 TABLET EVERY MORNING  90 tablet  1  . Calcium Carbonate-Vitamin D (CALCIUM + D PO) Take 1 tablet by mouth daily.      . Cranberry 250 MG CAPS Take 1 capsule by mouth daily.        Marland Kitchen docusate sodium (COLACE) 100 MG capsule Take 200 mg by mouth daily.        . fish oil-omega-3 fatty acids 1000 MG capsule Take 1 g by mouth 2 (two) times daily.       . meclizine (ANTIVERT) 25 MG tablet Take 25 mg by mouth 4 (four) times daily as needed.        . meloxicam (MOBIC) 15 MG tablet Take 1 tablet (15 mg total) by mouth daily. With food  90 tablet  1  . metoprolol succinate (TOPROL-XL) 50 MG 24 hr tablet TAKE 1 TABLET TWICE A DAY  180 tablet  1  . polycarbophil (FIBERCON) 625 MG tablet Take 625 mg by mouth daily as needed.        . pravastatin (PRAVACHOL) 20 MG tablet Take 1/2 to 1 tablet by mouth once daily  90 tablet  3  . sertraline (ZOLOFT) 50 MG tablet Take 1 tablet (50 mg total) by mouth daily.  90 tablet  3   No current facility-administered medications on file prior to visit.    Review of Systems Review of Systems  Constitutional: Negative for fever, appetite change, fatigue and unexpected weight change.  Eyes: Negative for pain and visual disturbance.  Respiratory: Negative for cough and shortness of breath.     Cardiovascular: Negative for cp or palpitations    Gastrointestinal: Negative for nausea, diarrhea and constipation.  Genitourinary: Negative for urgency and frequency.  Skin: Negative for pallor or rash   MSK pos for hip and back  pain and hip pain  Neurological: Negative for weakness, light-headedness, numbness and headaches.  Hematological: Negative for adenopathy. Does not bruise/bleed easily.  Psychiatric/Behavioral: Negative for dysphoric mood. The patient is not nervous/anxious.         Objective:   Physical Exam  Constitutional: She appears well-developed and well-nourished. No distress.  obese and well appearing   HENT:  Head: Normocephalic and atraumatic.  Right Ear: External ear normal.  Left Ear: External ear normal.  Nose: Nose normal.  Mouth/Throat: Oropharynx is clear and moist.  Eyes: Conjunctivae and EOM are normal. Pupils are equal, round, and reactive to light. Right eye exhibits no discharge. Left eye exhibits no discharge. No scleral icterus.  Neck: Normal range of motion. Neck supple. No JVD present. Carotid bruit is not present. No thyromegaly present.  Cardiovascular: Normal rate, regular rhythm, normal heart sounds and intact distal pulses.  Exam reveals no gallop.   No murmur heard. Pulmonary/Chest: Effort normal and breath sounds normal. No respiratory distress. She has no wheezes. She has no rales. She exhibits no tenderness.  Abdominal: Soft. Bowel sounds are normal. She exhibits no distension, no abdominal bruit and no mass. There is no tenderness.  Genitourinary: No breast swelling, tenderness, discharge or bleeding.  Breast exam: No mass, nodules, thickening, tenderness, bulging, retraction, inflamation, nipple discharge or skin changes noted.  No axillary or clavicular LA.      Musculoskeletal: She exhibits no edema and no tenderness.  Poor rom hips and LS   Lymphadenopathy:    She has no cervical adenopathy.  Neurological: She is alert. She has  normal reflexes. No cranial nerve deficit. She exhibits normal muscle tone. Coordination normal.  Skin: Skin is warm and dry. No rash noted. No erythema. No pallor.  Psychiatric: She has a normal mood and affect.          Assessment & Plan:   Problem List Items Addressed This Visit     Cardiovascular and Mediastinum   HYPERTENSION      bp in fair control at this time  BP Readings from Last 1 Encounters:  06/27/14 130/68   No changes needed Disc lifstyle change with low sodium diet and exercise  Labs today     Relevant Orders      CBC with Differential (Completed)      Comprehensive metabolic panel (Completed)      TSH (Completed)      Lipid panel (Completed)     Musculoskeletal and Integument   OSTEOPENIA     dexa 8/14 D level today Had a fall in Jan - upcoming MRI -likely no fx but looking for cause of some degeneration  Disc need for use of walker instead of a cane to prevent falls     Relevant Orders      Vit D  25 hydroxy (rtn osteoporosis monitoring) (Completed)     Genitourinary   RENAL INSUFFICIENCY      Labs today -had resolved in the past Lab Results  Component Value Date   CREATININE 0.90 09/15/2013    Prev improved  Enc good fluid intake  bp is well controlled  Aware she is on nsaid -will not continue it if labs worsen      Other   HYPERLIPIDEMIA     Lipid panel today  Has been controlled with simvastatin and diet Rev low sat fat diet     Relevant Orders      Lipid panel (Completed)   Colon cancer screening  Pt declines any colon cancer screening at this time      Encounter for Medicare annual wellness exam - Primary     Reviewed health habits including diet and exercise and skin cancer prevention Reviewed appropriate screening tests for age  Also reviewed health mt list, fam hx and immunization status , as well as social and family history   See HPI 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

## 2014-06-27 NOTE — Assessment & Plan Note (Signed)
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

## 2014-06-27 NOTE — Assessment & Plan Note (Addendum)
Labs today -had resolved in the past Lab Results  Component Value Date   CREATININE 0.90 09/15/2013    Prev improved  Enc good fluid intake  bp is well controlled  Aware she is on nsaid -will not continue it if labs worsen

## 2014-06-28 ENCOUNTER — Telehealth: Payer: Self-pay | Admitting: Family Medicine

## 2014-06-28 NOTE — Telephone Encounter (Signed)
emmi emailed °

## 2014-07-01 ENCOUNTER — Other Ambulatory Visit: Payer: Self-pay | Admitting: Family Medicine

## 2014-07-02 NOTE — Telephone Encounter (Signed)
Please refill for 6 mo 

## 2014-07-02 NOTE — Telephone Encounter (Signed)
Electronic refill request, please advise  

## 2014-07-02 NOTE — Telephone Encounter (Signed)
done

## 2014-07-10 ENCOUNTER — Other Ambulatory Visit: Payer: Medicare Other

## 2014-07-16 ENCOUNTER — Encounter: Payer: Self-pay | Admitting: Family Medicine

## 2014-07-16 ENCOUNTER — Ambulatory Visit (INDEPENDENT_AMBULATORY_CARE_PROVIDER_SITE_OTHER): Payer: Medicare Other | Admitting: Family Medicine

## 2014-07-16 VITALS — BP 144/80 | HR 50 | Temp 98.6°F | Ht 61.75 in | Wt 167.0 lb

## 2014-07-16 DIAGNOSIS — E785 Hyperlipidemia, unspecified: Secondary | ICD-10-CM

## 2014-07-16 DIAGNOSIS — I10 Essential (primary) hypertension: Secondary | ICD-10-CM

## 2014-07-16 DIAGNOSIS — D72829 Elevated white blood cell count, unspecified: Secondary | ICD-10-CM | POA: Insufficient documentation

## 2014-07-16 DIAGNOSIS — R740 Nonspecific elevation of levels of transaminase and lactic acid dehydrogenase [LDH]: Secondary | ICD-10-CM

## 2014-07-16 DIAGNOSIS — N289 Disorder of kidney and ureter, unspecified: Secondary | ICD-10-CM

## 2014-07-16 DIAGNOSIS — R7401 Elevation of levels of liver transaminase levels: Secondary | ICD-10-CM

## 2014-07-16 NOTE — Assessment & Plan Note (Signed)
Chol up  Pt has inc pravastatin to 20 daily- seems to tolerate  Check in 1 mo  Rev low sat fat diet

## 2014-07-16 NOTE — Assessment & Plan Note (Signed)
Lab Results  Component Value Date   CREATININE 1.1 06/27/2014   Overall up slt  Adv to stop aleve Take mobic only when needed Drink more water Re check in a mo

## 2014-07-16 NOTE — Progress Notes (Signed)
Pre visit review using our clinic review tool, if applicable. No additional management support is needed unless otherwise documented below in the visit note. 

## 2014-07-16 NOTE — Progress Notes (Signed)
Subjective:    Patient ID: Linda Robinson, female    DOB: 23-Mar-1929, 80 y.o.   MRN: [id number]  HPI Here to review some abn labs from last visit  Feels fine   Wt is up 3 lb   Wbc is 13 and hb 15-not far from her baseline No symptoms of illness   Renal insuff Lab Results  Component Value Date   CREATININE 1.1 06/27/2014   BUN 31* 06/27/2014   NA 139 06/27/2014   K 4.5 06/27/2014   CL 102 06/27/2014   CO2 31 06/27/2014  takes meloxicam every day - it does help her  She was also taking aleve  No symptoms of uti at all    Elevation of lft Lab Results  Component Value Date   ALT 84* 06/27/2014   AST 76* 06/27/2014   ALKPHOS 64 06/27/2014   BILITOT 1.3* 06/27/2014  she takes norco for chronic pain - she has not taken any since getting labs back  She tended to take it once per day (325)  No additional tylenol  Takes allopurinol once daily - for a while / has not tried to stop it  Does not drink alcohol     This is new   Cholesterol is up  Lab Results  Component Value Date   CHOL 260* 06/27/2014   CHOL 218* 04/11/2013   CHOL 210* 02/03/2012   Lab Results  Component Value Date   HDL 73.00 06/27/2014   HDL 57.30 04/11/2013   HDL 60.00 02/03/2012   Lab Results  Component Value Date   LDLCALC 167* 06/27/2014   LDLCALC 112* 08/04/2011   LDLCALC 210* 07/12/2007   Lab Results  Component Value Date   TRIG 99.0 06/27/2014   TRIG 154.0* 04/11/2013   TRIG 139.0 02/03/2012   Lab Results  Component Value Date   CHOLHDL 4 06/27/2014   CHOLHDL 4 04/11/2013   CHOLHDL 4 02/03/2012   Lab Results  Component Value Date   LDLDIRECT 135.4 04/11/2013   LDLDIRECT 134.0 02/03/2012   LDLDIRECT 150.8 10/30/2010    She was taking 1 pill every other day (they were hard to split)- so she increased it to one daily   Has had gall bladder surgery in the past  No abdominal pain   Patient Active Problem List   Diagnosis Date Noted  . Elevated transaminase level 07/16/2014  . Leukocytosis 07/16/2014    . Risk for falls 06/27/2014  . Fall (on)(from) incline, initial encounter 09/15/2013  . Low back pain on right side with sciatica 07/24/2013  . Edema 04/26/2013  . Encounter for Medicare annual wellness exam 04/11/2013  . Colon cancer screening 02/10/2012  . BRADYCARDIA 05/19/2010  . OSTEOPENIA 01/30/2009  . CONSTIPATION 11/01/2007  . ANXIETY STATE NEC 02/14/2007  . REACTION, ACUTE STRESS W/EMOTIONAL DSTURB 02/14/2007  . Depression with anxiety 02/14/2007  . Hyperlipidemia LDL goal <130 02/04/2007  . GOUT 02/04/2007  . Essential hypertension 02/04/2007  . DIVERTICULOSIS, COLON 02/04/2007  . Renal insufficiency 02/04/2007  . OSTEOARTHRITIS 02/04/2007  . [place] DISEASE 02/04/2007  . LEG CRAMPS 02/04/2007   Past Medical History  Diagnosis Date  . History of diverticulitis of colon   . Diverticulosis of colon   . Gout   . HLD (hyperlipidemia)   . HTN (hypertension)   . OA (osteoarthritis)   . Osteopenia   . GERD (gastroesophageal reflux disease)     atypical chest pain  . Basal cell carcinoma     removed  Past Surgical History  Procedure Laterality Date  . Appendectomy    . Cataract extraction  04/2006  . Cholecystectomy    . Partial colectomy  09/1999  . Back surgery    . Neck surgery    . Knee surgery      Medial meniscus tear   History  Substance Use Topics  . Smoking status: Never Smoker   . Smokeless tobacco: Not on file  . Alcohol Use: No   Family History  Problem Relation Age of Onset  . Cancer Father     throat and bladder  . Diabetes Father   . Hypertension Father   . Heart attack Father   . Stroke Mother   . Alzheimer's disease Mother   . Hypertension Mother    Allergies  Allergen Reactions  . Ace Inhibitors     REACTION: increased K+, increased creat.  . Cephalosporins     REACTION: reaction not known  . Colesevelam     REACTION: constipation  . Lovastatin     REACTION: doesn't work  . Raloxifene     REACTION: cramps  .  Rosuvastatin     REACTION: myalgia  . Simvastatin     REACTION: myalgia   Current Outpatient Prescriptions on File Prior to Visit  Medication Sig Dispense Refill  . amLODipine (NORVASC) 5 MG tablet TAKE 1 TABLET EVERY MORNING  90 tablet  1  . Calcium Carbonate-Vitamin D (CALCIUM + D PO) Take 1 tablet by mouth daily.      . Cranberry 250 MG CAPS Take 1 capsule by mouth daily.        Marland Kitchen docusate sodium (COLACE) 100 MG capsule Take 200 mg by mouth daily.        . fish oil-omega-3 fatty acids 1000 MG capsule Take 1 g by mouth 2 (two) times daily.       Marland Kitchen gabapentin (NEURONTIN) 300 MG capsule Take 1 capsule by mouth 3 (three) times daily.      Marland Kitchen HYDROcodone-acetaminophen (NORCO) 10-325 MG per tablet Take 1 tablet by mouth every 6 (six) hours as needed.      . meclizine (ANTIVERT) 25 MG tablet Take 25 mg by mouth 4 (four) times daily as needed.        . meloxicam (MOBIC) 15 MG tablet TAKE 1 TABLET DAILY WITH FOOD  90 tablet  1  . metoprolol succinate (TOPROL-XL) 50 MG 24 hr tablet TAKE 1 TABLET TWICE A DAY  180 tablet  1  . polycarbophil (FIBERCON) 625 MG tablet Take 625 mg by mouth daily as needed.        . sertraline (ZOLOFT) 50 MG tablet Take 1 tablet (50 mg total) by mouth daily.  90 tablet  3   No current facility-administered medications on file prior to visit.    Review of Systems    Review of Systems  Constitutional: Negative for fever, appetite change, and unexpected weight change. pos for fatigue  Eyes: Negative for pain and visual disturbance.  Respiratory: Negative for cough and shortness of breath.   Cardiovascular: Negative for cp or palpitations    Gastrointestinal: Negative for nausea, diarrhea and constipation.  Genitourinary: Negative for urgency and frequency.  Skin: Negative for pallor or rash   Neurological: Negative for weakness, light-headedness, numbness and headaches.  Hematological: Negative for adenopathy. Does not bruise/bleed easily.  Psychiatric/Behavioral:  Negative for dysphoric mood. The patient is not nervous/anxious.      Objective:   Physical Exam  Constitutional: She appears  well-developed and well-nourished. No distress.  obese and well appearing   HENT:  Head: Normocephalic and atraumatic.  Mouth/Throat: Oropharynx is clear and moist.  Eyes: Conjunctivae and EOM are normal. Pupils are equal, round, and reactive to light. No scleral icterus.  Neck: Normal range of motion. Neck supple. No JVD present. Carotid bruit is not present. No thyromegaly present.  Cardiovascular: Regular rhythm and normal heart sounds.   Pulmonary/Chest: Effort normal and breath sounds normal. No respiratory distress. She has no wheezes. She has no rales.  Good air exch  Abdominal: Soft. Bowel sounds are normal. She exhibits no distension and no mass. There is no hepatosplenomegaly. There is no tenderness.  No cva tenderness   Musculoskeletal: She exhibits no edema.  Lymphadenopathy:    She has no cervical adenopathy.  Neurological: She is alert. She has normal reflexes.  Skin: Skin is warm and dry. No rash noted. No erythema. No pallor.  Psychiatric: She has a normal mood and affect.          Assessment & Plan:   Problem List Items Addressed This Visit     Cardiovascular and Mediastinum   Essential hypertension - Primary      bp in fair control at this time  BP Readings from Last 1 Encounters:  07/16/14 144/80   No changes needed Disc lifstyle change with low sodium diet and exercise      Relevant Medications      pravastatin (PRAVACHOL) 20 MG tablet     Genitourinary   Renal insufficiency      Lab Results  Component Value Date   CREATININE 1.1 06/27/2014   Overall up slt  Adv to stop aleve Take mobic only when needed Drink more water Re check in a mo      Other   Hyperlipidemia LDL goal <130     Chol up  Pt has inc pravastatin to 20 daily- seems to tolerate  Check in 1 mo  Rev low sat fat diet     Relevant Medications       pravastatin (PRAVACHOL) 20 MG tablet   Elevated transaminase level     inst to avoid acetaminophen and etoh Take norco only when needed (she essentially stopped it)  Also hold allopurinol Continue statin Re check in a mo Reassuring exam    Leukocytosis     Suspect this is pt's baseline No symptoms  Continue to monitor

## 2014-07-16 NOTE — Assessment & Plan Note (Signed)
bp in fair control at this time  BP Readings from Last 1 Encounters:  07/16/14 144/80   No changes needed Disc lifstyle change with low sodium diet and exercise

## 2014-07-16 NOTE — Assessment & Plan Note (Signed)
inst to avoid acetaminophen and etoh Take norco only when needed (she essentially stopped it)  Also hold allopurinol Continue statin Re check in a mo Reassuring exam

## 2014-07-16 NOTE — Patient Instructions (Signed)
Continue your pravastatin once per day - for cholesterol - and also watch diet (Avoid red meat/ fried foods/ egg yolks/ fatty breakfast meats/ butter, cheese and high fat dairy/ and shellfish ) For liver function tests- hold your allopurinol for now , also take norco only when you need it and avoid tylenol  For kidneys -drink lots of water/ only take the meloxicam when you need it and avoid aleve or other over the counter pain medicines  If you develop any urinary symptoms or fever please let me know  Schedule fasting labs in 1 month to re check everything

## 2014-07-16 NOTE — Assessment & Plan Note (Signed)
Suspect this is pt's baseline No symptoms  Continue to monitor

## 2014-07-17 ENCOUNTER — Encounter: Payer: Medicare Other | Admitting: Family Medicine

## 2014-08-01 ENCOUNTER — Other Ambulatory Visit: Payer: Self-pay | Admitting: Family Medicine

## 2014-08-02 ENCOUNTER — Other Ambulatory Visit: Payer: Self-pay | Admitting: *Deleted

## 2014-08-02 ENCOUNTER — Encounter: Payer: Self-pay | Admitting: Family Medicine

## 2014-08-02 MED ORDER — AMLODIPINE BESYLATE 5 MG PO TABS: ORAL_TABLET | ORAL | Status: DC

## 2014-08-16 ENCOUNTER — Other Ambulatory Visit (INDEPENDENT_AMBULATORY_CARE_PROVIDER_SITE_OTHER): Payer: Medicare Other

## 2014-08-16 DIAGNOSIS — D72829 Elevated white blood cell count, unspecified: Secondary | ICD-10-CM

## 2014-08-16 DIAGNOSIS — R740 Nonspecific elevation of levels of transaminase and lactic acid dehydrogenase [LDH]: Secondary | ICD-10-CM

## 2014-08-16 DIAGNOSIS — R7401 Elevation of levels of liver transaminase levels: Secondary | ICD-10-CM

## 2014-08-16 DIAGNOSIS — E785 Hyperlipidemia, unspecified: Secondary | ICD-10-CM

## 2014-08-16 DIAGNOSIS — N289 Disorder of kidney and ureter, unspecified: Secondary | ICD-10-CM

## 2014-08-16 LAB — CBC WITH DIFFERENTIAL/PLATELET
Basophils Absolute: 0 10*3/uL (ref 0.0–0.1)
Basophils Relative: 0.3 % (ref 0.0–3.0)
EOS PCT: 3.1 % (ref 0.0–5.0)
Eosinophils Absolute: 0.4 10*3/uL (ref 0.0–0.7)
HCT: 45.9 % (ref 36.0–46.0)
Hemoglobin: 15 g/dL (ref 12.0–15.0)
Lymphocytes Relative: 34.8 % (ref 12.0–46.0)
Lymphs Abs: 4.4 10*3/uL — ABNORMAL HIGH (ref 0.7–4.0)
MCHC: 32.7 g/dL (ref 30.0–36.0)
MCV: 90.2 fl (ref 78.0–100.0)
MONOS PCT: 6.5 % (ref 3.0–12.0)
Monocytes Absolute: 0.8 10*3/uL (ref 0.1–1.0)
Neutro Abs: 7 10*3/uL (ref 1.4–7.7)
Neutrophils Relative %: 55.3 % (ref 43.0–77.0)
PLATELETS: 313 10*3/uL (ref 150.0–400.0)
RBC: 5.09 Mil/uL (ref 3.87–5.11)
RDW: 12.9 % (ref 11.5–15.5)
WBC: 12.6 10*3/uL — AB (ref 4.0–10.5)

## 2014-08-16 LAB — COMPREHENSIVE METABOLIC PANEL
ALBUMIN: 4 g/dL (ref 3.5–5.2)
ALT: 12 U/L (ref 0–35)
AST: 17 U/L (ref 0–37)
Alkaline Phosphatase: 59 U/L (ref 39–117)
BUN: 21 mg/dL (ref 6–23)
CO2: 31 meq/L (ref 19–32)
Calcium: 9.2 mg/dL (ref 8.4–10.5)
Chloride: 101 mEq/L (ref 96–112)
Creatinine, Ser: 1 mg/dL (ref 0.4–1.2)
GFR: 58.22 mL/min — AB (ref >60.00)
Glucose, Bld: 94 mg/dL (ref 70–99)
POTASSIUM: 4.2 meq/L (ref 3.5–5.1)
SODIUM: 139 meq/L (ref 135–145)
TOTAL PROTEIN: 7 g/dL (ref 6.0–8.3)
Total Bilirubin: 0.7 mg/dL (ref 0.2–1.2)

## 2014-08-16 LAB — LIPID PANEL
CHOLESTEROL: 230 mg/dL — AB (ref 0–200)
HDL: 60.9 mg/dL (ref >39.00)
LDL CALC: 138 mg/dL — AB (ref 0–99)
NonHDL: 169.1
Total CHOL/HDL Ratio: 4
Triglycerides: 157 mg/dL — ABNORMAL HIGH (ref 0.0–149.0)
VLDL: 31.4 mg/dL (ref 0.0–40.0)

## 2014-08-19 ENCOUNTER — Other Ambulatory Visit: Payer: Self-pay | Admitting: Family Medicine

## 2014-08-20 NOTE — Telephone Encounter (Signed)
Electronic refill request, please advise  

## 2014-08-20 NOTE — Telephone Encounter (Signed)
Please refill both for 6 mo

## 2014-08-21 NOTE — Telephone Encounter (Signed)
done

## 2014-10-05 ENCOUNTER — Encounter: Payer: Self-pay | Admitting: Family Medicine

## 2014-10-05 ENCOUNTER — Ambulatory Visit (INDEPENDENT_AMBULATORY_CARE_PROVIDER_SITE_OTHER): Payer: Medicare Other | Admitting: Family Medicine

## 2014-10-05 VITALS — BP 160/60 | HR 60 | Temp 98.3°F | Wt 171.2 lb

## 2014-10-05 DIAGNOSIS — R42 Dizziness and giddiness: Secondary | ICD-10-CM | POA: Insufficient documentation

## 2014-10-05 DIAGNOSIS — Z87898 Personal history of other specified conditions: Secondary | ICD-10-CM | POA: Insufficient documentation

## 2014-10-05 NOTE — Progress Notes (Signed)
BP 160/60 mmHg  Pulse 60  Temp(Src) 98.3 F (36.8 C) (Oral)  Wt 171 lb 4 oz (77.678 kg)   CC: dizziness  Subjective:    Patient ID: Linda Robinson, female    DOB: Apr 07, 1929, 81 y.o.   MRN: [id number]  HPI: Linda Robinson is a 81 y.o. female presenting on 10/05/2014 for Dizziness   Patient of Dr Marliss Coots with hypertension, hyperlipidemia, osteoarthritis and osteopenia and gout presents with 4d h/o dizziness. Intermittent dizziness over last few months, but acutely worse over weekend. In bed large portion of day over last few days. Dizziness described as vertigo sense of motion. No lightheadedness, no presyncope. Denies chest pain or tightness, palpitations. Occasional headaches. No hearing changes. Chronic tinnitus. Vertigo worse with sudden head movements mainly when getting out of bed. Has been treating with meclizine without improvement. Episodes last several hours. Denies recent viral infections  Brings log of blood pressures this morning - 170/62, 182/67, HR 49,51.   On butrans (buprenorphine) patch for chronic back pain from spinal stenosis - 51mcg once a week over the past month. Hasn't noticed much improvement in back pain. Dr Ron Agee doesn't think patch is causing dizziness. To see back surgeon.  On amlodipine 5mg  daily as well as toprol 50mg  daily and gabapentin 300mg  tid.   BP Readings from Last 3 Encounters:  10/05/14 160/60  07/16/14 144/80  06/27/14 130/68    Relevant past medical, surgical, family and social history reviewed and updated as indicated. Interim medical history since our last visit reviewed. Allergies and medications reviewed and updated. Current Outpatient Prescriptions on File Prior to Visit  Medication Sig  . amLODipine (NORVASC) 5 MG tablet TAKE 1 TABLET EVERY MORNING  . Calcium Carbonate-Vitamin D (CALCIUM + D PO) Take 1 tablet by mouth daily.  . Cranberry 250 MG CAPS Take 1 capsule by mouth daily.    Marland Kitchen docusate sodium (COLACE) 100 MG capsule Take  200 mg by mouth daily.    . fish oil-omega-3 fatty acids 1000 MG capsule Take 1 g by mouth 2 (two) times daily.   Marland Kitchen gabapentin (NEURONTIN) 300 MG capsule Take 1 capsule by mouth 3 (three) times daily.  . meclizine (ANTIVERT) 25 MG tablet Take 25 mg by mouth 4 (four) times daily as needed.    . metoprolol succinate (TOPROL-XL) 50 MG 24 hr tablet TAKE 1 TABLET TWICE A DAY (Patient taking differently: TAKE 1 TABLET ONCE DAILY)  . polycarbophil (FIBERCON) 625 MG tablet Take 625 mg by mouth daily as needed.    . pravastatin (PRAVACHOL) 20 MG tablet TAKE ONE-HALF (1/2) TABLET TO 1 TABLET ONCE DAILY  . sertraline (ZOLOFT) 50 MG tablet Take 1 tablet (50 mg total) by mouth daily.   No current facility-administered medications on file prior to visit.    Review of Systems Per HPI unless specifically indicated above     Objective:    BP 160/60 mmHg  Pulse 60  Temp(Src) 98.3 F (36.8 C) (Oral)  Wt 171 lb 4 oz (77.678 kg)  Wt Readings from Last 3 Encounters:  10/05/14 171 lb 4 oz (77.678 kg)  07/16/14 167 lb (75.751 kg)  06/27/14 164 lb 12 oz (74.73 kg)    Physical Exam  Constitutional: She appears well-developed and well-nourished. No distress.  HENT:  Mouth/Throat: Oropharynx is clear and moist. No oropharyngeal exudate.  Eyes: Conjunctivae and EOM are normal. Pupils are equal, round, and reactive to light.  Neck: Normal range of motion. Carotid bruit is not present.  Cardiovascular: Normal rate, regular rhythm, normal heart sounds and intact distal pulses.   No murmur heard. Pulmonary/Chest: Effort normal and breath sounds normal. No respiratory distress. She has no wheezes. She has no rales.  Lymphadenopathy:    She has no cervical adenopathy.  Neurological:  CN 2-12 intact Station and gait slightly unsteady - chronic - uses cane dix hallpike equivocal  Nursing note and vitals reviewed.  Orthostatic vital signs negative today     Assessment & Plan:   Problem List Items  Addressed This Visit    Vertigo - Primary    Anticipate peripheral cause of vertigo - likely BPPV. Treat with continue meclizine. Provided with modified epley. Not consistent with central cause, orthostatic, or antihypertensive related. Anticipate elevated bp readings over last few days 2/2 acute cases of vertigo. Update [place] if persistent sxs, advised if worsening to seek urgent care.        Follow up plan: Return if symptoms worsen or fail to improve.

## 2014-10-05 NOTE — Assessment & Plan Note (Signed)
Anticipate peripheral cause of vertigo - likely BPPV. Treat with continue meclizine. Provided with modified epley. Not consistent with central cause, orthostatic, or antihypertensive related. Anticipate elevated bp readings over last few days 2/2 acute cases of vertigo. Update [place] if persistent sxs, advised if worsening to seek urgent care.

## 2014-10-05 NOTE — Progress Notes (Signed)
Pre visit review using our clinic review tool, if applicable. No additional management support is needed unless otherwise documented below in the visit note. 

## 2014-10-05 NOTE — Patient Instructions (Signed)
I think we have benign positional vertigo or an inner ear cause of vertigo. Continue using meclizine. Avoid sudden head movements/turns. Should get better with time, let us know if persistent or worsening. I don't think it's butrans patch but hold on new patch until vertigo is better. Continue meds.  Benign Positional Vertigo Vertigo means you feel like you or your surroundings are moving when they are not. Benign positional vertigo is the most common form of vertigo. Benign means that the cause of your condition is not serious. Benign positional vertigo is more common in older adults. CAUSES  Benign positional vertigo is the result of an upset in the labyrinth system. This is an area in the middle ear that helps control your balance. This may be caused by a viral infection, head injury, or repetitive motion. However, often no specific cause is found. SYMPTOMS  Symptoms of benign positional vertigo occur when you move your head or eyes in different directions. Some of the symptoms may include:  Loss of balance and falls.  Vomiting.  Blurred vision.  Dizziness.  Nausea.  Involuntary eye movements (nystagmus). DIAGNOSIS  Benign positional vertigo is usually diagnosed by physical exam. If the specific cause of your benign positional vertigo is unknown, your caregiver may perform imaging tests, such as magnetic resonance imaging (MRI) or computed tomography (CT). TREATMENT  Your caregiver may recommend movements or procedures to correct the benign positional vertigo. Medicines such as meclizine, benzodiazepines, and medicines for nausea may be used to treat your symptoms. In rare cases, if your symptoms are caused by certain conditions that affect the inner ear, you may need surgery. HOME CARE INSTRUCTIONS   Follow your caregiver's instructions.  Move slowly. Do not make sudden body or head movements.  Avoid driving.  Avoid operating heavy machinery.  Avoid performing any tasks that  would be dangerous to you or others during a vertigo episode.  Drink enough fluids to keep your urine clear or pale yellow. SEEK IMMEDIATE MEDICAL CARE IF:   You develop problems with walking, weakness, numbness, or using your arms, hands, or legs.  You have difficulty speaking.  You develop severe headaches.  Your nausea or vomiting continues or gets worse.  You develop visual changes.  Your family or friends notice any behavioral changes.  Your condition gets worse.  You have a fever.  You develop a stiff neck or sensitivity to light. MAKE SURE YOU:   Understand these instructions.  Will watch your condition.  Will get help right away if you are not doing well or get worse. Document Released: 06/22/2006 Document Revised: 12/07/2011 Document Reviewed: 06/04/2011 [facility] Patient Information 2015 [place], [place]. This information is not intended to replace advice given to you by your health care provider. Make sure you discuss any questions you have with your health care provider.

## 2014-10-29 ENCOUNTER — Other Ambulatory Visit: Payer: Self-pay | Admitting: Neurosurgery

## 2014-10-29 DIAGNOSIS — M5416 Radiculopathy, lumbar region: Secondary | ICD-10-CM

## 2014-11-02 ENCOUNTER — Ambulatory Visit
Admission: RE | Admit: 2014-11-02 | Discharge: 2014-11-02 | Disposition: A | Discharge status: Home / Self Care | Payer: Medicare Other | Source: Ambulatory Visit | Attending: Neurosurgery | Admitting: Neurosurgery

## 2014-11-02 DIAGNOSIS — M5416 Radiculopathy, lumbar region: Secondary | ICD-10-CM

## 2014-11-02 MED ORDER — IOHEXOL 180 MG/ML  SOLN
20.0000 mL | Freq: Once | INTRAMUSCULAR | Status: AC | PRN
  Administered 2014-11-02: 20 mL via INTRATHECAL

## 2014-11-02 MED ORDER — ONDANSETRON HCL 4 MG/2ML IJ SOLN: 4.0000 mg | Freq: Four times a day (QID) | INTRAMUSCULAR | Status: DC | PRN

## 2014-11-02 NOTE — Progress Notes (Signed)
Pt states she has been off Sertraline for the past 2 days. Discharge instructions explained to pt.

## 2014-11-02 NOTE — Discharge Instructions (Signed)
Myelogram Discharge Instructions  1. Go home and rest quietly for the next 24 hours.  It is important to lie flat for the next 24 hours.  Get up only to go to the restroom.  You may lie in the bed or on a couch on your back, your stomach, your left side or your right side.  You may have one pillow under your head.  You may have pillows between your knees while you are on your side or under your knees while you are on your back.  2. DO NOT drive today.  Recline the seat as far back as it will go, while still wearing your seat belt, on the way home.  3. You may get up to go to the bathroom as needed.  You may sit up for 10 minutes to eat.  You may resume your normal diet and medications unless otherwise indicated.  Drink lots of extra fluids today and tomorrow.  4. The incidence of headache, nausea, or vomiting is about 5% (one in 20 patients).  If you develop a headache, lie flat and drink plenty of fluids until the headache goes away.  Caffeinated beverages may be helpful.  If you develop severe nausea and vomiting or a headache that does not go away with flat bed rest, call [phone].  5. You may resume normal activities after your 24 hours of bed rest is over; however, do not exert yourself strongly or do any heavy lifting tomorrow. If when you get up you have a headache when standing, go back to bed and force fluids for another 24 hours.  6. Call your physician for a follow-up appointment.  The results of your myelogram will be sent directly to your physician by the following day.  7. If you have any questions or if complications develop after you arrive home, please call [phone].  Discharge instructions have been explained to the patient.  The patient, or the person responsible for the patient, fully understands these instructions.      May resume Sertraline on Feb.6, 2016, after 1:00 pm.

## 2014-11-06 ENCOUNTER — Other Ambulatory Visit: Payer: Self-pay | Admitting: Neurosurgery

## 2014-11-07 ENCOUNTER — Encounter (HOSPITAL_COMMUNITY): Payer: Self-pay | Admitting: Pharmacy Technician

## 2014-11-08 NOTE — Pre-Procedure Instructions (Signed)
Linda Robinson  11/08/2014   Your procedure is scheduled on:  Tues, Feb 16 @ 9:50 AM  Report to [facility]  at 7:45 AM.  Call this number if you have problems the morning of surgery: [phone]   Remember:   Do not eat food or drink liquids after midnight.   Take these medicines the morning of surgery with A SIP OF WATER: Amlodipine(Norvasc),Gabapentin(Neurontin),Meclizine(Antivert),Metoprolol(Toprol),and Zoloft(Sertraline)              Stop taking your Fish Oil. No Goody's,BC's,Aleve,Aspirin,Ibuprofen,Fish Oil,or any Herbal Medications.   Do not wear jewelry, make-up or nail polish.  Do not wear lotions, powders, or perfumes. You may wear deodorant.  Do not shave 48 hours prior to surgery.  Do not bring valuables to the hospital.  [facility] is not responsible                  for any belongings or valuables.               Contacts, dentures or bridgework may not be worn into surgery.  Leave suitcase in the car. After surgery it may be brought to your room.  For patients admitted to the hospital, discharge time is determined by your                treatment team.               Patients discharged the day of surgery will not be allowed to drive  home.    Special Instructions:  [place] - Preparing for Surgery  Before surgery, you can play an important role.  Because skin is not sterile, your skin needs to be as free of germs as possible.  You can reduce the number of germs on you skin by washing with CHG (chlorahexidine gluconate) soap before surgery.  CHG is an antiseptic cleaner which kills germs and bonds with the skin to continue killing germs even after washing.  Please DO NOT use if you have an allergy to CHG or antibacterial soaps.  If your skin becomes reddened/irritated stop using the CHG and inform your nurse when you arrive at Short Stay.  Do not shave (including legs and underarms) for at least 48 hours prior to the first CHG shower.  You may shave your  face.  Please follow these instructions carefully:   1.  Shower with CHG Soap the night before surgery and the                                morning of Surgery.  2.  If you choose to wash your hair, wash your hair first as usual with your       normal shampoo.  3.  After you shampoo, rinse your hair and body thoroughly to remove the                      Shampoo.  4.  Use CHG as you would any other liquid soap.  You can apply chg directly       to the skin and wash gently with scrungie or a clean washcloth.  5.  Apply the CHG Soap to your body ONLY FROM THE NECK DOWN.        Do not use on open wounds or open sores.  Avoid contact with your eyes,       ears, mouth and  genitals (private parts).  Wash genitals (private parts)       with your normal soap.  6.  Wash thoroughly, paying special attention to the area where your surgery        will be performed.  7.  Thoroughly rinse your body with warm water from the neck down.  8.  DO NOT shower/wash with your normal soap after using and rinsing off       the CHG Soap.  9.  Pat yourself dry with a clean towel.            10.  Wear clean pajamas.            11.  Place clean sheets on your bed the night of your first shower and do not        sleep with pets.  Day of Surgery  Do not apply any lotions/deoderants the morning of surgery.  Please wear clean clothes to the hospital/surgery center.     Please read over the following fact sheets that you were given: Pain Booklet, Coughing and Deep Breathing, MRSA Information and Surgical Site Infection Prevention

## 2014-11-09 ENCOUNTER — Encounter (HOSPITAL_COMMUNITY)
Admission: RE | Admit: 2014-11-09 | Discharge: 2014-11-09 | Disposition: A | Discharge status: Home / Self Care | Payer: Medicare Other | Source: Ambulatory Visit | Attending: Neurosurgery | Admitting: Neurosurgery

## 2014-11-09 ENCOUNTER — Other Ambulatory Visit: Payer: Self-pay

## 2014-11-09 ENCOUNTER — Encounter (HOSPITAL_COMMUNITY): Payer: Self-pay

## 2014-11-09 DIAGNOSIS — Z79899 Other long term (current) drug therapy: Secondary | ICD-10-CM | POA: Diagnosis not present

## 2014-11-09 DIAGNOSIS — Z01812 Encounter for preprocedural laboratory examination: Secondary | ICD-10-CM | POA: Diagnosis present

## 2014-11-09 DIAGNOSIS — K219 Gastro-esophageal reflux disease without esophagitis: Secondary | ICD-10-CM | POA: Insufficient documentation

## 2014-11-09 DIAGNOSIS — Z01810 Encounter for preprocedural cardiovascular examination: Secondary | ICD-10-CM | POA: Insufficient documentation

## 2014-11-09 DIAGNOSIS — E785 Hyperlipidemia, unspecified: Secondary | ICD-10-CM | POA: Diagnosis not present

## 2014-11-09 DIAGNOSIS — R001 Bradycardia, unspecified: Secondary | ICD-10-CM | POA: Insufficient documentation

## 2014-11-09 DIAGNOSIS — M4806 Spinal stenosis, lumbar region: Secondary | ICD-10-CM | POA: Insufficient documentation

## 2014-11-09 DIAGNOSIS — I10 Essential (primary) hypertension: Secondary | ICD-10-CM | POA: Diagnosis not present

## 2014-11-09 HISTORY — DX: Constipation, unspecified: K59.00

## 2014-11-09 HISTORY — DX: Depression, unspecified: F32.A

## 2014-11-09 HISTORY — DX: Major depressive disorder, single episode, unspecified: F32.9

## 2014-11-09 HISTORY — DX: Other chronic pain: G89.29

## 2014-11-09 HISTORY — DX: Dorsalgia, unspecified: M54.9

## 2014-11-09 HISTORY — DX: Dizziness and giddiness: R42

## 2014-11-09 LAB — BASIC METABOLIC PANEL
ANION GAP: 7 (ref 5–15)
BUN: 18 mg/dL (ref 6–23)
CO2: 30 mmol/L (ref 19–32)
CREATININE: 0.99 mg/dL (ref 0.50–1.10)
Calcium: 9.5 mg/dL (ref 8.4–10.5)
Chloride: 103 mmol/L (ref 96–112)
GFR calc non Af Amer: 51 mL/min — ABNORMAL LOW (ref >90)
GFR, EST AFRICAN AMERICAN: 59 mL/min — AB (ref >90)
Glucose, Bld: 92 mg/dL (ref 70–99)
Potassium: 4.7 mmol/L (ref 3.5–5.1)
SODIUM: 140 mmol/L (ref 135–145)

## 2014-11-09 LAB — CBC
HEMATOCRIT: 44.1 % (ref 36.0–46.0)
HEMOGLOBIN: 14.5 g/dL (ref 12.0–15.0)
MCH: 29.5 pg (ref 26.0–34.0)
MCHC: 32.9 g/dL (ref 30.0–36.0)
MCV: 89.6 fL (ref 78.0–100.0)
Platelets: 293 10*3/uL (ref 150–400)
RBC: 4.92 MIL/uL (ref 3.87–5.11)
RDW: 12.7 % (ref 11.5–15.5)
WBC: 9.2 10*3/uL (ref 4.0–10.5)

## 2014-11-09 LAB — SURGICAL PCR SCREEN
MRSA, PCR: NEGATIVE
Staphylococcus aureus: NEGATIVE

## 2014-11-09 NOTE — Progress Notes (Addendum)
Medical Md is Dr.Marne Tower  Pt doesn't have a cardiologist  Stress test done 20+yrs ago  Denies ever having an echo or heart cath  Denies EKG or CXR in past yr

## 2014-11-09 NOTE — Progress Notes (Addendum)
Anesthesia Chart Review:  Pt is 81 year old female scheduled for 1 level lumbar laminectomy/decompression microdiscectomy on 11/13/2014 with Dr. Hal Neer.   PMH includes: hyperlipidemia, HTN, GERD, vertigo. BMI 30  Medications include: buprenorphine, metoprolol, pravastatin  Preoperative labs reviewed.    EKG: Sinus bradycardia (51 bpm). Possible Inferior infarct, age undetermined. Appears stable compared with EKG's from 2010.   If no changes, I anticipate pt can proceed with surgery as scheduled.   Willeen Cass, FNP-BC [facility]/Anesthesiology Phone: [phone] 11/09/2014 2:09 PM  Spoke with pt.  She has not taken buprenorphine in a month.   Willeen Cass, FNP-BC [facility] Stay [facility]/Anesthesiology Phone: [phone] 11/09/2014 4:11 PM

## 2014-11-12 MED ORDER — DEXAMETHASONE SODIUM PHOSPHATE 10 MG/ML IJ SOLN
10.0000 mg | INTRAMUSCULAR | Status: DC
  Filled 2014-11-12: qty 1

## 2014-11-12 MED ORDER — VANCOMYCIN HCL IN DEXTROSE 1-5 GM/200ML-% IV SOLN
1000.0000 mg | INTRAVENOUS | Status: AC
  Administered 2014-11-13: 1000 mg via INTRAVENOUS
  Filled 2014-11-12: qty 200

## 2014-11-13 ENCOUNTER — Encounter (HOSPITAL_COMMUNITY)
Admission: RE | Disposition: A | Discharge status: Home / Self Care | Payer: Self-pay | Source: Ambulatory Visit | Attending: Neurosurgery

## 2014-11-13 ENCOUNTER — Inpatient Hospital Stay (HOSPITAL_COMMUNITY)
Admission: RE | Admit: 2014-11-13 | Discharge: 2014-11-14 | DRG: 520 | Disposition: A | Discharge status: Home / Self Care | Payer: Medicare Other | Source: Ambulatory Visit | Attending: Neurosurgery | Admitting: Neurosurgery

## 2014-11-13 ENCOUNTER — Inpatient Hospital Stay (HOSPITAL_COMMUNITY): Payer: Medicare Other | Admitting: Emergency Medicine

## 2014-11-13 ENCOUNTER — Inpatient Hospital Stay (HOSPITAL_COMMUNITY): Payer: Medicare Other | Admitting: Anesthesiology

## 2014-11-13 ENCOUNTER — Encounter (HOSPITAL_COMMUNITY): Payer: Self-pay | Admitting: *Deleted

## 2014-11-13 ENCOUNTER — Inpatient Hospital Stay (HOSPITAL_COMMUNITY): Payer: Medicare Other

## 2014-11-13 DIAGNOSIS — K219 Gastro-esophageal reflux disease without esophagitis: Secondary | ICD-10-CM | POA: Diagnosis present

## 2014-11-13 DIAGNOSIS — Z9841 Cataract extraction status, right eye: Secondary | ICD-10-CM

## 2014-11-13 DIAGNOSIS — Z9049 Acquired absence of other specified parts of digestive tract: Secondary | ICD-10-CM | POA: Diagnosis present

## 2014-11-13 DIAGNOSIS — M549 Dorsalgia, unspecified: Secondary | ICD-10-CM | POA: Diagnosis present

## 2014-11-13 DIAGNOSIS — M479 Spondylosis, unspecified: Secondary | ICD-10-CM | POA: Diagnosis present

## 2014-11-13 DIAGNOSIS — E785 Hyperlipidemia, unspecified: Secondary | ICD-10-CM | POA: Diagnosis present

## 2014-11-13 DIAGNOSIS — Z85828 Personal history of other malignant neoplasm of skin: Secondary | ICD-10-CM | POA: Diagnosis not present

## 2014-11-13 DIAGNOSIS — M4806 Spinal stenosis, lumbar region: Secondary | ICD-10-CM | POA: Diagnosis present

## 2014-11-13 DIAGNOSIS — I10 Essential (primary) hypertension: Secondary | ICD-10-CM | POA: Diagnosis present

## 2014-11-13 DIAGNOSIS — M8580 Other specified disorders of bone density and structure, unspecified site: Secondary | ICD-10-CM | POA: Diagnosis present

## 2014-11-13 DIAGNOSIS — M48061 Spinal stenosis, lumbar region without neurogenic claudication: Secondary | ICD-10-CM | POA: Diagnosis present

## 2014-11-13 DIAGNOSIS — M5126 Other intervertebral disc displacement, lumbar region: Secondary | ICD-10-CM | POA: Diagnosis present

## 2014-11-13 DIAGNOSIS — Z419 Encounter for procedure for purposes other than remedying health state, unspecified: Secondary | ICD-10-CM

## 2014-11-13 DIAGNOSIS — F329 Major depressive disorder, single episode, unspecified: Secondary | ICD-10-CM | POA: Diagnosis present

## 2014-11-13 HISTORY — PX: LUMBAR LAMINECTOMY/DECOMPRESSION MICRODISCECTOMY: SHX5026

## 2014-11-13 SURGERY — LUMBAR LAMINECTOMY/DECOMPRESSION MICRODISCECTOMY 1 LEVEL
Anesthesia: General | Site: Back | Laterality: Bilateral

## 2014-11-13 MED ORDER — HEMOSTATIC AGENTS (NO CHARGE) OPTIME
TOPICAL | Status: DC | PRN
  Administered 2014-11-13: 1 via TOPICAL

## 2014-11-13 MED ORDER — SODIUM CHLORIDE 0.9 % IJ SOLN
3.0000 mL | Freq: Two times a day (BID) | INTRAMUSCULAR | Status: DC
  Administered 2014-11-13 (×2): 3 mL via INTRAVENOUS

## 2014-11-13 MED ORDER — THROMBIN 5000 UNITS EX SOLR
CUTANEOUS | Status: DC | PRN
  Administered 2014-11-13 (×2): 5000 [IU] via TOPICAL

## 2014-11-13 MED ORDER — PROPOFOL 10 MG/ML IV BOLUS
INTRAVENOUS | Status: DC | PRN
  Administered 2014-11-13: 120 mg via INTRAVENOUS

## 2014-11-13 MED ORDER — SERTRALINE HCL 50 MG PO TABS
50.0000 mg | ORAL_TABLET | Freq: Every day | ORAL | Status: DC
  Administered 2014-11-14: 50 mg via ORAL
  Filled 2014-11-13: qty 1

## 2014-11-13 MED ORDER — FENTANYL CITRATE 0.05 MG/ML IJ SOLN
INTRAMUSCULAR | Status: DC | PRN
  Administered 2014-11-13 (×3): 50 ug via INTRAVENOUS

## 2014-11-13 MED ORDER — PHENOL 1.4 % MT LIQD: 1.0000 | OROMUCOSAL | Status: DC | PRN

## 2014-11-13 MED ORDER — ONDANSETRON HCL 4 MG/2ML IJ SOLN
INTRAMUSCULAR | Status: DC | PRN
  Administered 2014-11-13: 4 mg via INTRAVENOUS

## 2014-11-13 MED ORDER — ROCURONIUM BROMIDE 50 MG/5ML IV SOLN
INTRAVENOUS | Status: AC
  Filled 2014-11-13: qty 1

## 2014-11-13 MED ORDER — PROPOFOL 10 MG/ML IV BOLUS
INTRAVENOUS | Status: AC
  Filled 2014-11-13: qty 20

## 2014-11-13 MED ORDER — ROCURONIUM BROMIDE 100 MG/10ML IV SOLN
INTRAVENOUS | Status: DC | PRN
  Administered 2014-11-13: 50 mg via INTRAVENOUS

## 2014-11-13 MED ORDER — KETOROLAC TROMETHAMINE 30 MG/ML IJ SOLN
15.0000 mg | Freq: Four times a day (QID) | INTRAMUSCULAR | Status: DC
  Administered 2014-11-13 – 2014-11-14 (×3): 15 mg via INTRAVENOUS
  Filled 2014-11-13 (×7): qty 1

## 2014-11-13 MED ORDER — BACITRACIN 50000 UNITS IM SOLR
INTRAMUSCULAR | Status: DC | PRN
  Administered 2014-11-13: 10:00:00

## 2014-11-13 MED ORDER — VANCOMYCIN HCL IN DEXTROSE 1-5 GM/200ML-% IV SOLN
1000.0000 mg | Freq: Once | INTRAVENOUS | Status: AC
  Administered 2014-11-13: 1000 mg via INTRAVENOUS
  Filled 2014-11-13: qty 200

## 2014-11-13 MED ORDER — PANTOPRAZOLE SODIUM 40 MG IV SOLR
40.0000 mg | Freq: Every day | INTRAVENOUS | Status: DC
  Filled 2014-11-13: qty 40

## 2014-11-13 MED ORDER — KCL IN DEXTROSE-NACL 20-5-0.45 MEQ/L-%-% IV SOLN
80.0000 mL/h | INTRAVENOUS | Status: DC
  Filled 2014-11-13 (×3): qty 1000

## 2014-11-13 MED ORDER — LACTATED RINGERS IV SOLN
INTRAVENOUS | Status: DC | PRN
  Administered 2014-11-13 (×2): via INTRAVENOUS

## 2014-11-13 MED ORDER — LACTATED RINGERS IV SOLN: INTRAVENOUS | Status: DC

## 2014-11-13 MED ORDER — MENTHOL 3 MG MT LOZG
1.0000 | LOZENGE | OROMUCOSAL | Status: DC | PRN
  Filled 2014-11-13: qty 9

## 2014-11-13 MED ORDER — NEOSTIGMINE METHYLSULFATE 10 MG/10ML IV SOLN
INTRAVENOUS | Status: DC | PRN
  Administered 2014-11-13: 4 mg via INTRAVENOUS

## 2014-11-13 MED ORDER — ONDANSETRON HCL 4 MG/2ML IJ SOLN: 4.0000 mg | INTRAMUSCULAR | Status: DC | PRN

## 2014-11-13 MED ORDER — EPHEDRINE SULFATE 50 MG/ML IJ SOLN
INTRAMUSCULAR | Status: DC | PRN
  Administered 2014-11-13: 15 mg via INTRAVENOUS
  Administered 2014-11-13 (×2): 5 mg via INTRAVENOUS

## 2014-11-13 MED ORDER — GABAPENTIN 300 MG PO CAPS
300.0000 mg | ORAL_CAPSULE | Freq: Three times a day (TID) | ORAL | Status: DC
  Administered 2014-11-13 – 2014-11-14 (×3): 300 mg via ORAL
  Filled 2014-11-13 (×5): qty 1

## 2014-11-13 MED ORDER — SODIUM CHLORIDE 0.9 % IJ SOLN: 3.0000 mL | INTRAMUSCULAR | Status: DC | PRN

## 2014-11-13 MED ORDER — BUPIVACAINE HCL (PF) 0.5 % IJ SOLN
INTRAMUSCULAR | Status: DC | PRN
  Administered 2014-11-13: 20 mL

## 2014-11-13 MED ORDER — PANTOPRAZOLE SODIUM 40 MG PO TBEC
40.0000 mg | DELAYED_RELEASE_TABLET | Freq: Every day | ORAL | Status: DC
  Administered 2014-11-13: 40 mg via ORAL
  Filled 2014-11-13: qty 1

## 2014-11-13 MED ORDER — ACETAMINOPHEN 650 MG RE SUPP: 650.0000 mg | RECTAL | Status: DC | PRN

## 2014-11-13 MED ORDER — 0.9 % SODIUM CHLORIDE (POUR BTL) OPTIME
TOPICAL | Status: DC | PRN
  Administered 2014-11-13: 1000 mL

## 2014-11-13 MED ORDER — DEXAMETHASONE SODIUM PHOSPHATE 10 MG/ML IJ SOLN
INTRAMUSCULAR | Status: DC | PRN
  Administered 2014-11-13: 10 mg via INTRAVENOUS

## 2014-11-13 MED ORDER — HYDROMORPHONE HCL 1 MG/ML IJ SOLN: 0.2500 mg | INTRAMUSCULAR | Status: DC | PRN

## 2014-11-13 MED ORDER — FENTANYL CITRATE 0.05 MG/ML IJ SOLN
INTRAMUSCULAR | Status: AC
  Filled 2014-11-13: qty 5

## 2014-11-13 MED ORDER — GLYCOPYRROLATE 0.2 MG/ML IJ SOLN
INTRAMUSCULAR | Status: DC | PRN
  Administered 2014-11-13: 0.2 mg via INTRAVENOUS
  Administered 2014-11-13: 0.6 mg via INTRAVENOUS

## 2014-11-13 MED ORDER — DEXAMETHASONE SODIUM PHOSPHATE 4 MG/ML IJ SOLN
4.0000 mg | Freq: Four times a day (QID) | INTRAMUSCULAR | Status: AC
  Administered 2014-11-13: 4 mg via INTRAVENOUS
  Filled 2014-11-13: qty 1

## 2014-11-13 MED ORDER — LIDOCAINE HCL (CARDIAC) 20 MG/ML IV SOLN
INTRAVENOUS | Status: DC | PRN
  Administered 2014-11-13: 60 mg via INTRAVENOUS

## 2014-11-13 MED ORDER — HYDROCODONE-ACETAMINOPHEN 5-325 MG PO TABS: 1.0000 | ORAL_TABLET | ORAL | Status: DC | PRN

## 2014-11-13 MED ORDER — ACETAMINOPHEN 325 MG PO TABS: 650.0000 mg | ORAL_TABLET | ORAL | Status: DC | PRN

## 2014-11-13 MED ORDER — MECLIZINE HCL 25 MG PO TABS: 25.0000 mg | ORAL_TABLET | Freq: Four times a day (QID) | ORAL | Status: DC | PRN

## 2014-11-13 MED ORDER — PHENYLEPHRINE 40 MCG/ML (10ML) SYRINGE FOR IV PUSH (FOR BLOOD PRESSURE SUPPORT)
PREFILLED_SYRINGE | INTRAVENOUS | Status: AC
  Filled 2014-11-13: qty 10

## 2014-11-13 MED ORDER — SUCCINYLCHOLINE CHLORIDE 20 MG/ML IJ SOLN
INTRAMUSCULAR | Status: AC
  Filled 2014-11-13: qty 1

## 2014-11-13 MED ORDER — AMLODIPINE BESYLATE 5 MG PO TABS
5.0000 mg | ORAL_TABLET | Freq: Every day | ORAL | Status: DC
  Administered 2014-11-14: 5 mg via ORAL
  Filled 2014-11-13: qty 1

## 2014-11-13 MED ORDER — HYDROMORPHONE HCL 1 MG/ML IJ SOLN: 1.0000 mg | INTRAMUSCULAR | Status: DC | PRN

## 2014-11-13 MED ORDER — DEXAMETHASONE 4 MG PO TABS
4.0000 mg | ORAL_TABLET | Freq: Four times a day (QID) | ORAL | Status: AC
  Administered 2014-11-13: 4 mg via ORAL
  Filled 2014-11-13: qty 1

## 2014-11-13 SURGICAL SUPPLY — 49 items
BAG DECANTER FOR FLEXI CONT (MISCELLANEOUS) ×3 IMPLANT
BENZOIN TINCTURE PRP APPL 2/3 (GAUZE/BANDAGES/DRESSINGS) ×3 IMPLANT
BLADE CLIPPER SURG (BLADE) ×3 IMPLANT
BRUSH SCRUB EZ PLAIN DRY (MISCELLANEOUS) ×3 IMPLANT
CANISTER SUCT 3000ML PPV (MISCELLANEOUS) ×3 IMPLANT
CLOSURE WOUND 1/2 X4 (GAUZE/BANDAGES/DRESSINGS) ×1
CONT SPEC 4OZ CLIKSEAL STRL BL (MISCELLANEOUS) ×3 IMPLANT
DRAPE LAPAROTOMY 100X72X124 (DRAPES) ×3 IMPLANT
DRAPE MICROSCOPE LEICA (MISCELLANEOUS) ×3 IMPLANT
DRAPE POUCH INSTRU U-SHP 10X18 (DRAPES) ×3 IMPLANT
DRAPE SURG 17X23 STRL (DRAPES) ×6 IMPLANT
DRSG OPSITE POSTOP 3X4 (GAUZE/BANDAGES/DRESSINGS) ×3 IMPLANT
DRSG TELFA 3X8 NADH (GAUZE/BANDAGES/DRESSINGS) IMPLANT
ELECT REM PT RETURN 9FT ADLT (ELECTROSURGICAL) ×3
ELECTRODE REM PT RTRN 9FT ADLT (ELECTROSURGICAL) ×1 IMPLANT
GAUZE SPONGE 4X4 12PLY STRL (GAUZE/BANDAGES/DRESSINGS) IMPLANT
GAUZE SPONGE 4X4 16PLY XRAY LF (GAUZE/BANDAGES/DRESSINGS) IMPLANT
GLOVE BIOGEL PI IND STRL 7.0 (GLOVE) ×2 IMPLANT
GLOVE BIOGEL PI IND STRL 7.5 (GLOVE) ×1 IMPLANT
GLOVE BIOGEL PI INDICATOR 7.0 (GLOVE) ×4
GLOVE BIOGEL PI INDICATOR 7.5 (GLOVE) ×2
GLOVE ECLIPSE 6.5 STRL STRAW (GLOVE) ×6 IMPLANT
GLOVE ECLIPSE 7.0 STRL STRAW (GLOVE) ×6 IMPLANT
GLOVE ECLIPSE 8.0 STRL XLNG CF (GLOVE) ×3 IMPLANT
GOWN STRL REUS W/ TWL LRG LVL3 (GOWN DISPOSABLE) ×2 IMPLANT
GOWN STRL REUS W/ TWL XL LVL3 (GOWN DISPOSABLE) ×1 IMPLANT
GOWN STRL REUS W/TWL 2XL LVL3 (GOWN DISPOSABLE) IMPLANT
GOWN STRL REUS W/TWL LRG LVL3 (GOWN DISPOSABLE) ×4
GOWN STRL REUS W/TWL XL LVL3 (GOWN DISPOSABLE) ×2
KIT BASIN OR (CUSTOM PROCEDURE TRAY) ×3 IMPLANT
KIT ROOM TURNOVER OR (KITS) ×3 IMPLANT
LIQUID BAND (GAUZE/BANDAGES/DRESSINGS) ×3 IMPLANT
NEEDLE HYPO 22GX1.5 SAFETY (NEEDLE) ×3 IMPLANT
NEEDLE SPNL 22GX3.5 QUINCKE BK (NEEDLE) ×3 IMPLANT
NS IRRIG 1000ML POUR BTL (IV SOLUTION) ×3 IMPLANT
PACK LAMINECTOMY NEURO (CUSTOM PROCEDURE TRAY) ×3 IMPLANT
PAD ARMBOARD 7.5X6 YLW CONV (MISCELLANEOUS) ×9 IMPLANT
PATTIES SURGICAL .75X.75 (GAUZE/BANDAGES/DRESSINGS) IMPLANT
RUBBERBAND STERILE (MISCELLANEOUS) ×6 IMPLANT
SPONGE SURGIFOAM ABS GEL SZ50 (HEMOSTASIS) ×3 IMPLANT
STAPLER SKIN PROX WIDE 3.9 (STAPLE) IMPLANT
STRIP CLOSURE SKIN 1/2X4 (GAUZE/BANDAGES/DRESSINGS) ×2 IMPLANT
SUT VIC AB 2-0 OS6 18 (SUTURE) ×9 IMPLANT
SUT VIC AB 3-0 CP2 18 (SUTURE) ×3 IMPLANT
SYR 20ML ECCENTRIC (SYRINGE) ×3 IMPLANT
TOOL FLUTED BALL 7MM (MISCELLANEOUS) ×3 IMPLANT
TOWEL OR 17X24 6PK STRL BLUE (TOWEL DISPOSABLE) ×3 IMPLANT
TOWEL OR 17X26 10 PK STRL BLUE (TOWEL DISPOSABLE) ×3 IMPLANT
WATER STERILE IRR 1000ML POUR (IV SOLUTION) ×3 IMPLANT

## 2014-11-13 NOTE — Anesthesia Procedure Notes (Signed)
Procedure Name: Intubation Date/Time: 11/13/2014 9:48 AM Performed by: Maeola Harman Pre-anesthesia Checklist: Patient identified, Emergency Drugs available, Suction available, Patient being monitored and Timeout performed Patient Re-evaluated:Patient Re-evaluated prior to inductionOxygen Delivery Method: Circle system utilized Preoxygenation: Pre-oxygenation with 100% oxygen Intubation Type: IV induction Ventilation: Mask ventilation without difficulty Laryngoscope Size: 3 and Mac Grade View: Grade I Tube type: Oral Tube size: 7.5 mm Number of attempts: 1 Airway Equipment and Method: Stylet Placement Confirmation: ETT inserted through vocal cords under direct vision,  positive ETCO2 and breath sounds checked- equal and bilateral Secured at: 21 cm Tube secured with: Tape Dental Injury: Teeth and Oropharynx as per pre-operative assessment

## 2014-11-13 NOTE — Anesthesia Preprocedure Evaluation (Addendum)
Anesthesia Evaluation  Patient identified by MRN, date of birth, ID band Patient awake    Reviewed: Allergy & Precautions, H&P , NPO status , Patient's Chart, lab work & pertinent test results, reviewed documented beta blocker date and time   Airway Mallampati: II  TM Distance: >3 FB Neck ROM: Full    Dental no notable dental hx. (+) Teeth Intact, Dental Advisory Given   Pulmonary neg pulmonary ROS,  breath sounds clear to auscultation  Pulmonary exam normal       Cardiovascular hypertension, Pt. on medications and Pt. on home beta blockers + dysrhythmias Rhythm:Regular Rate:Normal     Neuro/Psych Anxiety Depression negative neurological ROS     GI/Hepatic Neg liver ROS, GERD-  Medicated and Controlled,  Endo/Other  negative endocrine ROS  Renal/GU Renal diseasenegative Renal ROS  negative genitourinary   Musculoskeletal  (+) Arthritis -, Osteoarthritis,    Abdominal (+)  Abdomen: soft. Bowel sounds: normal.  Peds  Hematology negative hematology ROS (+)   Anesthesia Other Findings   Reproductive/Obstetrics negative OB ROS                           Anesthesia Physical Anesthesia Plan  ASA: II  Anesthesia Plan: General   Post-op Pain Management:    Induction: Intravenous  Airway Management Planned: Oral ETT  Additional Equipment:   Intra-op Plan:   Post-operative Plan: Extubation in OR  Informed Consent: I have reviewed the patients History and Physical, chart, labs and discussed the procedure including the risks, benefits and alternatives for the proposed anesthesia with the patient or authorized representative who has indicated his/her understanding and acceptance.   Dental advisory given  Plan Discussed with: CRNA  Anesthesia Plan Comments:         Anesthesia Quick Evaluation

## 2014-11-13 NOTE — Plan of Care (Signed)
Problem: Consults Goal: Diagnosis - Spinal Surgery Outcome: Completed/Met Date Met:  11/13/14 Lumbar Laminectomy (Complex)     

## 2014-11-13 NOTE — Anesthesia Postprocedure Evaluation (Signed)
  Anesthesia Post-op Note  Patient: Linda Robinson  Procedure(s) Performed: Procedure(s): Lumbar one-two Bilateral laminectomy and foraminotomy, Left Lumbar one-two microdiscectomy  (Bilateral)  Patient Location: PACU  Anesthesia Type:General  Level of Consciousness: awake and alert   Airway and Oxygen Therapy: Patient Spontanous Breathing  Post-op Pain: none  Post-op Assessment: Post-op Vital signs reviewed, Patient's Cardiovascular Status Stable and Respiratory Function Stable  Post-op Vital Signs: Reviewed  Filed Vitals:   11/13/14 1247  BP: 189/65  Pulse: 62  Temp: 36.6 C  Resp: 20    Complications: No apparent anesthesia complications

## 2014-11-13 NOTE — H&P (Signed)
Linda Robinson is an 81 y.o. female.   Chief Complaint: Back pain into the legs HPI: The patient is an 81 year old female who is evaluated in the office for back pain in the legs more so on the left than the right. She was tried on conservative therapy including injections and these gave her no sustained relief. She sought a second opinion appropriate gray but no definite answers were forthcoming. The patient disappeared for a few months and return to our care early this year. We reviewed her previous MRI scan and then proceeded with myelography which showed multiple levels of mild disease but a complete block to the dye with severe stenosis at L1-2 which is multifactorial. After discussing the options the patient requested surgery now she comes for a bilateral decompression at L1-2 with evaluation of the L1-2 disc. I've had a long discussion with her regarding the risks and benefits of surgical intervention. The risks discussed include but are not limited to bleeding infection weakness some as paralysis spinal fluid leak coma and death. We have discussed alternative methods of therapy offered risks and benefits of nonintervention. She's had the opportunity to ask numerous questions and appears to understand. With this information in hand she has requested we proceed with surgery.  Past Medical History  Diagnosis Date  . History of diverticulitis of colon   . Diverticulosis of colon   . Gout     hx of;was on Allopurinol but taken off 6 wks ago by medical MD  . HLD (hyperlipidemia)     takes Pravastatin daily  . Osteopenia     takes Calcium and Vit D daily  . GERD (gastroesophageal reflux disease)     atypical chest pain  . Basal cell carcinoma     removed  . HTN (hypertension)     takes Amlodipine and Metoprolol daily  . Vertigo     takes Antivert daily as needed  . Constipation     takes Colace daily  . Depression     takes Zoloft daily  . OA (osteoarthritis)     right knee  . Chronic  back pain     stenosis    Past Surgical History  Procedure Laterality Date  . Cataract extraction Right 04/2006  . Cholecystectomy    . Partial colectomy  09/1999    d/t diverticulitis  . Neck surgery      x 2;fusion  . Knee surgery Right     Medial meniscus tear  . Back surgery    . Tubal ligation    . Appendectomy  at age 80  . Colonoscopy      Family History  Problem Relation Age of Onset  . Cancer Father     throat and bladder  . Diabetes Father   . Hypertension Father   . Heart attack Father   . Stroke Mother   . Alzheimer's disease Mother   . Hypertension Mother    Social History:  reports that she has never smoked. She does not have any smokeless tobacco history on file. She reports that she does not drink alcohol or use illicit drugs.  Allergies:  Allergies  Allergen Reactions  . Ace Inhibitors     REACTION: increased K+, increased creat.  . Cephalosporins     REACTION: reaction not known  . Colesevelam     REACTION: constipation  . Lovastatin     REACTION: doesn't work  . Raloxifene     REACTION: cramps  . Rosuvastatin  REACTION: myalgia  . Simvastatin     REACTION: myalgia    Medications Prior to Admission  Medication Sig Dispense Refill  . amLODipine (NORVASC) 5 MG tablet TAKE 1 TABLET EVERY MORNING 90 tablet 1  . buprenorphine (BUTRANS) 5 MCG/HR PTWK patch Place 5 mcg onto the skin once a week.    . Calcium Carbonate-Vitamin D (CALCIUM + D PO) Take 1 tablet by mouth daily.    . Cranberry 250 MG CAPS Take 1 capsule by mouth daily.      Marland Kitchen docusate sodium (COLACE) 100 MG capsule Take 200 mg by mouth daily.      . fish oil-omega-3 fatty acids 1000 MG capsule Take 1 g by mouth 2 (two) times daily.     Marland Kitchen gabapentin (NEURONTIN) 300 MG capsule Take 1 capsule by mouth 3 (three) times daily.    . meclizine (ANTIVERT) 25 MG tablet Take 25 mg by mouth 4 (four) times daily as needed for dizziness.     . metoprolol succinate (TOPROL-XL) 50 MG 24 hr tablet  TAKE 1 TABLET TWICE A DAY (Patient taking differently: TAKE 1 TABLET ONCE DAILY) 180 tablet 1  . polycarbophil (FIBERCON) 625 MG tablet Take 625 mg by mouth daily as needed for diarrhea or loose stools.     . pravastatin (PRAVACHOL) 20 MG tablet TAKE ONE-HALF (1/2) TABLET TO 1 TABLET ONCE DAILY 90 tablet 1  . sertraline (ZOLOFT) 50 MG tablet Take 1 tablet (50 mg total) by mouth daily. 90 tablet 3    No results found for this or any previous visit (from the past 48 hour(s)). No results found.  Positive for high blood pressure high cholesterol  Blood pressure 165/48, pulse 51, temperature 97.7 F (36.5 C), temperature source Oral, resp. rate 18, height 5' 2.5" (1.588 m), weight 76.658 kg (169 lb), SpO2 99 %.  The patient is awake or and oriented. She has no facial asymmetry. Her gait is slow mildly antalgic. She has absent reflexes but normal strength Assessment/Plan Impression is that of severe multifactorial spinal stenosis L1-2. The plan is for an L1-2 decompression.  Faythe Ghee, MD 11/13/2014, 9:12 AM

## 2014-11-13 NOTE — Transfer of Care (Signed)
Immediate Anesthesia Transfer of Care Note  Patient: Linda Robinson  Procedure(s) Performed: Procedure(s): Lumbar one-two Bilateral laminectomy and foraminotomy, Left Lumbar one-two microdiscectomy  (Bilateral)  Patient Location: PACU  Anesthesia Type:General  Level of Consciousness: awake, alert  and sedated  Airway & Oxygen Therapy: Patient connected to face mask oxygen  Post-op Assessment: Report given to RN  Post vital signs: stable  Last Vitals:  Filed Vitals:   11/13/14 0807  BP: 165/48  Pulse:   Temp:   Resp:     Complications: No apparent anesthesia complications

## 2014-11-13 NOTE — Op Note (Signed)
Preop diagnosis: Severe spinal stenosis L1-2 with complete myelographic block, multi-factorial Postop diagnosis: Spondylosis L1-2 bilateral with herniated disc L1-2 left Procedure: Bilateral L1-2 decompressive laminectomy Left L1-2 microdiscectomy Surgeon: Cooper Moroney Asst.: Nundkumar  After being placed in the prone position the patient's back was prepped and draped in the usual sterile fashion. Localizing x-ray was taken prior to incision to identify the appropriate level. Midline incision was made above the spinous processes of L1 and L2. Using Bovie cutting current the incision was carried on the spinous processes. Subperiosteal dissection was then carried out bilaterally on the spinous processes lamina facet joint and self-retaining retractor was placed for exposure. X-ray showed approach the appropriate level. Using the Leksell rongeur spinous processes interspinous ligament were removed. Starting the patient's left side generous laminotomy was performed by removing the inferior two thirds of the L1 lamina the medial one third of the facet joint the superior one third of the L4 to lamina. Residual bone and hypertrophic ligamentum flavum removed in a piecemeal fashion. Similar decompression was then carried on the right side and then residual midline structures were removed to complete the bilateral decompression. At this time the microscope was draped and used to evaluate the disc on the left side at L1-2. It was found to be significantly extruded we elected to incise it and cleaned out with pituitary Terrie rongeurs and curettes and this was successfully done. At this time inspection was carried out in all directions for any evidence of residual compression and none could be identified. Irrigation was carried out and any bleeding controlled with upper coagulation Gelfoam. The was then closed in multiple layers of Vicryl on the muscle fascia subcutaneous and subcuticular tissues. Dermabond and Steri-Strips  were placed on the skin. A sterile dressing was then applied and the patient was extubated and taken to recovery room in stable condition.

## 2014-11-14 ENCOUNTER — Encounter (HOSPITAL_COMMUNITY): Payer: Self-pay | Admitting: Neurosurgery

## 2014-11-14 NOTE — Progress Notes (Signed)
Patient alert and oriented, mae's well, voiding adequate amount of urine, swallowing without difficulty, no c/o pain. Patient discharged home with family. Script and discharged instructions given to patient. Patient and family stated understanding of d/c instructions given and has an appointment with MD. Aisha Zohair Epp RN 

## 2014-11-14 NOTE — Discharge Summary (Signed)
  Physician Discharge Summary  Patient ID: Linda Robinson MRN: [id number] DOB/AGE: 05-Jul-1929 62 y.o.  Admit date: 11/13/2014 Discharge date: 11/14/2014  Admission Diagnoses:  Discharge Diagnoses:  Active Problems:   Lumbar spinal stenosis   Discharged Condition: good  Hospital Course: Surgery yesterday for L 12 decompresion. Did very well. No pain post op. Ambulated well. No new neuro issues. Home pod 1, specific instructions given.  Consults: None  Significant Diagnostic Studies: none  Treatments: surgery: L 12 decompression  Discharge Exam: Blood pressure 147/50, pulse 58, temperature 98 F (36.7 C), temperature source Oral, resp. rate 20, height 5' 2.5" (1.588 m), weight 76.658 kg (169 lb), SpO2 100 %. Incision/Wound:clean and dry; no new neuro issues  Disposition:      Medication List    ASK your doctor about these medications        amLODipine 5 MG tablet  Commonly known as:  NORVASC  TAKE 1 TABLET EVERY MORNING     BUTRANS 5 MCG/HR Ptwk patch  Generic drug:  buprenorphine  Place 5 mcg onto the skin once a week.     CALCIUM + D PO  Take 1 tablet by mouth daily.     Cranberry 250 MG Caps  Take 1 capsule by mouth daily.     docusate sodium 100 MG capsule  Commonly known as:  COLACE  Take 200 mg by mouth daily.     fish oil-omega-3 fatty acids 1000 MG capsule  Take 1 g by mouth 2 (two) times daily.     gabapentin 300 MG capsule  Commonly known as:  NEURONTIN  Take 1 capsule by mouth 3 (three) times daily.     meclizine 25 MG tablet  Commonly known as:  ANTIVERT  Take 25 mg by mouth 4 (four) times daily as needed for dizziness.     metoprolol succinate 50 MG 24 hr tablet  Commonly known as:  TOPROL-XL  TAKE 1 TABLET TWICE A DAY     polycarbophil 625 MG tablet  Commonly known as:  FIBERCON  Take 625 mg by mouth daily as needed for diarrhea or loose stools.     pravastatin 20 MG tablet  Commonly known as:  PRAVACHOL  TAKE ONE-HALF (1/2)  TABLET TO 1 TABLET ONCE DAILY     sertraline 50 MG tablet  Commonly known as:  ZOLOFT  Take 1 tablet (50 mg total) by mouth daily.         At home rest most of the time. Get up 9 or 10 times each day and take a 15 or 20 minute walk. No riding in the car and to your first postoperative appointment. If you have neck surgery you may shower from the chest down starting on the third postoperative day. If you had back surgery he may start showering on the third postoperative day with saran wrap wrapped around your incisional area 3 times. After the shower remove the saran wrap. Take pain medicine as needed and other medications as instructed. Call my office for an appointment.  SignedFaythe Ghee, MD 11/14/2014, 8:43 AM

## 2014-11-15 ENCOUNTER — Emergency Department (HOSPITAL_COMMUNITY)
Admission: EM | Admit: 2014-11-15 | Discharge: 2014-11-15 | Disposition: A | Discharge status: Home / Self Care | Payer: Medicare Other | Attending: Emergency Medicine | Admitting: Emergency Medicine

## 2014-11-15 ENCOUNTER — Encounter (HOSPITAL_COMMUNITY): Payer: Self-pay | Admitting: Emergency Medicine

## 2014-11-15 ENCOUNTER — Emergency Department (HOSPITAL_COMMUNITY): Payer: Medicare Other

## 2014-11-15 DIAGNOSIS — Z9889 Other specified postprocedural states: Secondary | ICD-10-CM | POA: Diagnosis not present

## 2014-11-15 DIAGNOSIS — W1839XA Other fall on same level, initial encounter: Secondary | ICD-10-CM | POA: Insufficient documentation

## 2014-11-15 DIAGNOSIS — K5900 Constipation, unspecified: Secondary | ICD-10-CM | POA: Insufficient documentation

## 2014-11-15 DIAGNOSIS — S0083XA Contusion of other part of head, initial encounter: Secondary | ICD-10-CM | POA: Diagnosis not present

## 2014-11-15 DIAGNOSIS — M1711 Unilateral primary osteoarthritis, right knee: Secondary | ICD-10-CM | POA: Insufficient documentation

## 2014-11-15 DIAGNOSIS — R55 Syncope and collapse: Secondary | ICD-10-CM | POA: Diagnosis present

## 2014-11-15 DIAGNOSIS — G8918 Other acute postprocedural pain: Secondary | ICD-10-CM | POA: Diagnosis not present

## 2014-11-15 DIAGNOSIS — M545 Low back pain, unspecified: Secondary | ICD-10-CM

## 2014-11-15 DIAGNOSIS — E785 Hyperlipidemia, unspecified: Secondary | ICD-10-CM | POA: Insufficient documentation

## 2014-11-15 DIAGNOSIS — F329 Major depressive disorder, single episode, unspecified: Secondary | ICD-10-CM | POA: Insufficient documentation

## 2014-11-15 DIAGNOSIS — Y998 Other external cause status: Secondary | ICD-10-CM | POA: Insufficient documentation

## 2014-11-15 DIAGNOSIS — I10 Essential (primary) hypertension: Secondary | ICD-10-CM | POA: Insufficient documentation

## 2014-11-15 DIAGNOSIS — G8929 Other chronic pain: Secondary | ICD-10-CM | POA: Diagnosis not present

## 2014-11-15 DIAGNOSIS — Z79899 Other long term (current) drug therapy: Secondary | ICD-10-CM | POA: Diagnosis not present

## 2014-11-15 DIAGNOSIS — Y9301 Activity, walking, marching and hiking: Secondary | ICD-10-CM | POA: Insufficient documentation

## 2014-11-15 DIAGNOSIS — M8580 Other specified disorders of bone density and structure, unspecified site: Secondary | ICD-10-CM | POA: Insufficient documentation

## 2014-11-15 DIAGNOSIS — Y92192 Bathroom in other specified residential institution as the place of occurrence of the external cause: Secondary | ICD-10-CM | POA: Insufficient documentation

## 2014-11-15 DIAGNOSIS — M109 Gout, unspecified: Secondary | ICD-10-CM | POA: Insufficient documentation

## 2014-11-15 DIAGNOSIS — W19XXXA Unspecified fall, initial encounter: Secondary | ICD-10-CM

## 2014-11-15 DIAGNOSIS — Z85828 Personal history of other malignant neoplasm of skin: Secondary | ICD-10-CM | POA: Insufficient documentation

## 2014-11-15 LAB — CBC
HCT: 37.2 % (ref 36.0–46.0)
Hemoglobin: 12.1 g/dL (ref 12.0–15.0)
MCH: 28.4 pg (ref 26.0–34.0)
MCHC: 32.5 g/dL (ref 30.0–36.0)
MCV: 87.3 fL (ref 78.0–100.0)
Platelets: 238 10*3/uL (ref 150–400)
RBC: 4.26 MIL/uL (ref 3.87–5.11)
RDW: 13.1 % (ref 11.5–15.5)
WBC: 12.7 10*3/uL — AB (ref 4.0–10.5)

## 2014-11-15 LAB — CBG MONITORING, ED: Glucose-Capillary: 116 mg/dL — ABNORMAL HIGH (ref 70–99)

## 2014-11-15 LAB — BASIC METABOLIC PANEL
Anion gap: 6 (ref 5–15)
BUN: 21 mg/dL (ref 6–23)
CALCIUM: 8.5 mg/dL (ref 8.4–10.5)
CO2: 31 mmol/L (ref 19–32)
CREATININE: 1.05 mg/dL (ref 0.50–1.10)
Chloride: 101 mmol/L (ref 96–112)
GFR calc Af Amer: 55 mL/min — ABNORMAL LOW (ref >90)
GFR, EST NON AFRICAN AMERICAN: 48 mL/min — AB (ref >90)
Glucose, Bld: 139 mg/dL — ABNORMAL HIGH (ref 70–99)
POTASSIUM: 3.8 mmol/L (ref 3.5–5.1)
SODIUM: 138 mmol/L (ref 135–145)

## 2014-11-15 NOTE — Discharge Instructions (Signed)
Be careful when standing up, and sit down immediately if you feel dizzy. Get plenty of rest and drink a lot of fluids. Follow-up with Dr. Hal Neer, as scheduled for evaluation of your recent back surgery.   Back Pain, Adult Low back pain is very common. About 1 in 5 people have back pain.The cause of low back pain is rarely dangerous. The pain often gets better over time.About half of people with a sudden onset of back pain feel better in just 2 weeks. About 8 in 10 people feel better by 6 weeks.  CAUSES Some common causes of back pain include:  Strain of the muscles or ligaments supporting the spine.  Wear and tear (degeneration) of the spinal discs.  Arthritis.  Direct injury to the back. DIAGNOSIS Most of the time, the direct cause of low back pain is not known.However, back pain can be treated effectively even when the exact cause of the pain is unknown.Answering your caregiver's questions about your overall health and symptoms is one of the most accurate ways to make sure the cause of your pain is not dangerous. If your caregiver needs more information, he or she may order lab work or imaging tests (X-rays or MRIs).However, even if imaging tests show changes in your back, this usually does not require surgery. HOME CARE INSTRUCTIONS For many people, back pain returns.Since low back pain is rarely dangerous, it is often a condition that people can learn to [facility] their own.   Remain active. It is stressful on the back to sit or stand in one place. Do not sit, drive, or stand in one place for more than 30 minutes at a time. Take short walks on level surfaces as soon as pain allows.Try to increase the length of time you walk each day.  Do not stay in bed.Resting more than 1 or 2 days can delay your recovery.  Do not avoid exercise or work.Your body is made to move.It is not dangerous to be active, even though your back may hurt.Your back will likely heal faster if you return  to being active before your pain is gone.  Pay attention to your body when you bend and lift. Many people have less discomfortwhen lifting if they bend their knees, keep the load close to their bodies,and avoid twisting. Often, the most comfortable positions are those that put less stress on your recovering back.  Find a comfortable position to sleep. Use a firm mattress and lie on your side with your knees slightly bent. If you lie on your back, put a pillow under your knees.  Only take over-the-counter or prescription medicines as directed by your caregiver. Over-the-counter medicines to reduce pain and inflammation are often the most helpful.Your caregiver may prescribe muscle relaxant drugs.These medicines help dull your pain so you can more quickly return to your normal activities and healthy exercise.  Put ice on the injured area.  Put ice in a plastic bag.  Place a towel between your skin and the bag.  Leave the ice on for 15-20 minutes, 03-04 times a day for the first 2 to 3 days. After that, ice and heat may be alternated to reduce pain and spasms.  Ask your caregiver about trying back exercises and gentle massage. This may be of some benefit.  Avoid feeling anxious or stressed.Stress increases muscle tension and can worsen back pain.It is important to recognize when you are anxious or stressed and learn ways to manage it.Exercise is a great option. [place]  CARE IF:  You have pain that is not relieved with rest or medicine.  You have pain that does not improve in 1 week.  You have new symptoms.  You are generally not feeling well. SEEK IMMEDIATE MEDICAL CARE IF:   You have pain that radiates from your back into your legs.  You develop new bowel or bladder control problems.  You have unusual weakness or numbness in your arms or legs.  You develop nausea or vomiting.  You develop abdominal pain.  You feel faint. Document Released: 09/14/2005 Document  Revised: 03/15/2012 Document Reviewed: 01/16/2014 [facility] Patient Information 2015 [place], [place]. This information is not intended to replace advice given to you by your health care provider. Make sure you discuss any questions you have with your health care provider.  Contusion A contusion is a deep bruise. Contusions happen when an injury causes bleeding under the skin. Signs of bruising include pain, puffiness (swelling), and discolored skin. The contusion may turn blue, purple, or yellow. HOME CARE   Put ice on the injured area.  Put ice in a plastic bag.  Place a towel between your skin and the bag.  Leave the ice on for 15-20 minutes, 03-04 times a day.  Only take medicine as told by your doctor.  Rest the injured area.  If possible, raise (elevate) the injured area to lessen puffiness. GET HELP RIGHT AWAY IF:   You have more bruising or puffiness.  You have pain that is getting worse.  Your puffiness or pain is not helped by medicine. MAKE SURE YOU:   Understand these instructions.  Will watch your condition.  Will get help right away if you are not doing well or get worse. Document Released: 03/02/2008 Document Revised: 12/07/2011 Document Reviewed: 07/20/2011 [facility] Patient Information 2015 [place], [place]. This information is not intended to replace advice given to you by your health care provider. Make sure you discuss any questions you have with your health care provider.  Fall Prevention and Home Safety Falls cause injuries and can affect all age groups. It is possible to prevent falls.  HOW TO PREVENT FALLS  Wear shoes with rubber soles that do not have an opening for your toes.  Keep the inside and outside of your house well lit.  Use night lights throughout your home.  Remove clutter from floors.  Clean up floor spills.  Remove throw rugs or fasten them to the floor with carpet tape.  Do not place electrical cords across pathways.  Put grab  bars by your tub, shower, and toilet. Do not use towel bars as grab bars.  Put handrails on both sides of the stairway. Fix loose handrails.  Do not climb on stools or stepladders, if possible.  Do not wax your floors.  Repair uneven or unsafe sidewalks, walkways, or stairs.  Keep items you use a lot within reach.  Be aware of pets.  Keep emergency numbers next to the telephone.  Put smoke detectors in your home and near bedrooms. Ask your doctor what other things you can do to prevent falls. Document Released: 07/11/2009 Document Revised: 03/15/2012 Document Reviewed: 12/15/2011 [facility] Patient Information 2015 [place], [place]. This information is not intended to replace advice given to you by your health care provider. Make sure you discuss any questions you have with your health care provider.

## 2014-11-15 NOTE — ED Provider Notes (Signed)
CSN: [id number]     Arrival date & time 11/15/14  1922 History   First MD Initiated Contact with Patient 11/15/14 1949     Chief Complaint  Patient presents with  . Near Syncope  . Fall     (Consider location/radiation/quality/duration/timing/severity/associated sxs/prior Treatment) HPI   Linda Robinson is a 81 y.o. female who is here for evaluation of near syncope causing fall, with injury to face.  This occurred today as she was walking to the bathroom to clean up.  She had lumbar surgery 2 days ago and was discharged from the hospital yesterday.  At home earlier she had been walking with her walker, without dizziness.  She's had decreased oral intake, but is eating some.  She is taking hydrocodone for pain.  She denies fever, chills, cough, shortness of breath, chest pain, increased in back pain, or leg pain/paresthesia.  There are no other known modifying factors   Past Medical History  Diagnosis Date  . History of diverticulitis of colon   . Diverticulosis of colon   . Gout     hx of;was on Allopurinol but taken off 6 wks ago by medical MD  . HLD (hyperlipidemia)     takes Pravastatin daily  . Osteopenia     takes Calcium and Vit D daily  . GERD (gastroesophageal reflux disease)     atypical chest pain  . Basal cell carcinoma     removed  . HTN (hypertension)     takes Amlodipine and Metoprolol daily  . Vertigo     takes Antivert daily as needed  . Constipation     takes Colace daily  . Depression     takes Zoloft daily  . OA (osteoarthritis)     right knee  . Chronic back pain     stenosis   Past Surgical History  Procedure Laterality Date  . Cataract extraction Right 04/2006  . Cholecystectomy    . Partial colectomy  09/1999    d/t diverticulitis  . Neck surgery      x 2;fusion  . Knee surgery Right     Medial meniscus tear  . Back surgery    . Tubal ligation    . Appendectomy  at age 15  . Colonoscopy    . Lumbar laminectomy/decompression  microdiscectomy Bilateral 11/13/2014    Procedure: Lumbar one-two Bilateral laminectomy and foraminotomy, Left Lumbar one-two microdiscectomy ;  Surgeon: Faythe Ghee, MD;  Location: [facility];  Service: Neurosurgery;  Laterality: Bilateral;   Family History  Problem Relation Age of Onset  . Cancer Father     throat and bladder  . Diabetes Father   . Hypertension Father   . Heart attack Father   . Stroke Mother   . Alzheimer's disease Mother   . Hypertension Mother    History  Substance Use Topics  . Smoking status: Never Smoker   . Smokeless tobacco: Not on file  . Alcohol Use: No   OB History    No data available     Review of Systems  All other systems reviewed and are negative.     Allergies  Ace inhibitors; Cephalosporins; Colesevelam; Lovastatin; Raloxifene; Rosuvastatin; and Simvastatin  Home Medications   Prior to Admission medications   Medication Sig Start Date End Date Taking? Authorizing Provider  amLODipine (NORVASC) 5 MG tablet TAKE 1 TABLET EVERY MORNING 08/02/14   Abner Greenspan, MD  buprenorphine (BUTRANS) 5 MCG/HR PTWK patch Place 5 mcg onto the  skin once a week.    Historical Provider, MD  Calcium Carbonate-Vitamin D (CALCIUM + D PO) Take 1 tablet by mouth daily.    Historical Provider, MD  Cranberry 250 MG CAPS Take 1 capsule by mouth daily.      Historical Provider, MD  docusate sodium (COLACE) 100 MG capsule Take 200 mg by mouth daily.      Historical Provider, MD  fish oil-omega-3 fatty acids 1000 MG capsule Take 1 g by mouth 2 (two) times daily.     Historical Provider, MD  gabapentin (NEURONTIN) 300 MG capsule Take 1 capsule by mouth 3 (three) times daily. 06/11/14   Historical Provider, MD  meclizine (ANTIVERT) 25 MG tablet Take 25 mg by mouth 4 (four) times daily as needed for dizziness.     Historical Provider, MD  metoprolol succinate (TOPROL-XL) 50 MG 24 hr tablet TAKE 1 TABLET TWICE A DAY Patient taking differently: TAKE 1 TABLET ONCE  DAILY 08/01/14   Abner Greenspan, MD  polycarbophil (FIBERCON) 625 MG tablet Take 625 mg by mouth daily as needed for diarrhea or loose stools.     Historical Provider, MD  pravastatin (PRAVACHOL) 20 MG tablet TAKE ONE-HALF (1/2) TABLET TO 1 TABLET ONCE DAILY 08/21/14   Abner Greenspan, MD  sertraline (ZOLOFT) 50 MG tablet Take 1 tablet (50 mg total) by mouth daily. 01/01/14   Ria Bush, MD   BP 130/68 mmHg  Pulse 60  Temp(Src) 98.4 F (36.9 C) (Oral)  Resp 20  Ht 5\' 2"  (1.575 m)  Wt 168 lb (76.204 kg)  BMI 30.72 kg/m2  SpO2 98% Physical Exam  Constitutional: She is oriented to person, place, and time. She appears well-developed.  Elderly, frail  HENT:  Head: Normocephalic and atraumatic.  Right Ear: External ear normal.  Left Ear: External ear normal.  Eyes: Conjunctivae and EOM are normal. Pupils are equal, round, and reactive to light.  Neck: Normal range of motion and phonation normal. Neck supple.  Cardiovascular: Normal rate, regular rhythm and normal heart sounds.   Pulmonary/Chest: Effort normal and breath sounds normal. She exhibits no tenderness (No chest wall deformity, or crepitation) and no bony tenderness.  Abdominal: Soft. There is no tenderness.  Musculoskeletal: Normal range of motion. She exhibits no edema or tenderness.  Lumbar pain, with maneuver from supine to sitting, but she is able to do it with minimal assistance.  Lumbar incision is covered with a bandage.  There is no localized swelling, deformity or significant pain on palpation in the lumbar region.  Neurological: She is alert and oriented to person, place, and time. No cranial nerve deficit or sensory deficit. She exhibits normal muscle tone. Coordination normal.  Skin: Skin is warm, dry and intact.  Psychiatric: She has a normal mood and affect. Her behavior is normal. Judgment and thought content normal.  Nursing note and vitals reviewed.   ED Course  Procedures (including critical care time) Labs  Review Labs Reviewed  CBC - Abnormal; Notable for the following:    WBC 12.7 (*)    All other components within normal limits  BASIC METABOLIC PANEL - Abnormal; Notable for the following:    Glucose, Bld 139 (*)    GFR calc non Af Amer 48 (*)    GFR calc Af Amer 55 (*)    All other components within normal limits  CBG MONITORING, ED - Abnormal; Notable for the following:    Glucose-Capillary 116 (*)    All other components within  normal limits    Imaging Review Dg Lumbar Spine Complete  11/15/2014   CLINICAL DATA:  Pt states that she fell today and is having lower back pains, states that she had lumbar surgery yesterday and fell today  EXAM: LUMBAR SPINE - COMPLETE 4+ VIEW  COMPARISON:  07/28/2013  FINDINGS: No fracture.  Mild dextroscoliosis with the apex at L 2.  Mild retrolisthesis, grade 1, of L2 on L3. No other spondylolisthesis.  Moderate loss disc height at L1-L2 and L2-L3 with mild loss disc height at L3-L4. L4-L5 and L5-S1 discs are relatively well preserved  There is facet degenerative change at L4-L5 and L5-S1.  Bones are demineralized.  Soft tissues are unremarkable.  IMPRESSION: 1. No fracture or acute finding. 2. Degenerative changes as described without significant change from the prior radiographs.   Electronically Signed   By: Lajean Manes M.D.   On: 11/15/2014 21:20   Ct Head Wo Contrast  11/15/2014   CLINICAL DATA:  Dizziness and fall.  Blow to the bridge of the nose.  EXAM: CT HEAD WITHOUT CONTRAST  TECHNIQUE: Contiguous axial images were obtained from the base of the skull through the vertex without intravenous contrast.  COMPARISON:  Head CT scan 05/2003/ 2010.  FINDINGS: The brain appears normal without hemorrhage, infarct, mass lesion, mass effect, midline shift or abnormal extra-axial fluid collection. Brain volume appears appropriate for age. No fracture is identified. Imaged paranasal sinuses and mastoid air cells are clear.  IMPRESSION: Negative head CT scan.    Electronically Signed   By: Inge Rise M.D.   On: 11/15/2014 21:41     EKG Interpretation   Date/Time:  Thursday November 15 2014 19:39:44 EST Ventricular Rate:  62 PR Interval:  186 QRS Duration: 92 QT Interval:  410 QTC Calculation: 416 R Axis:   58 Text Interpretation:  Sinus rhythm Minimal ST depression, lateral leads  since last tracing no significant change Confirmed by Eulis Foster  MD, Henryk Ursin  [phone]) on 11/15/2014 8:26:24 PM      MDM   Final diagnoses:  Near syncope  Fall, initial encounter  Contusion of face, initial encounter  Midline low back pain without sciatica    Fall after near syncope. Suspect multi-factorial etiology. Doubt SBI, surgical complication or injury to lower back.  Nursing Notes Reviewed/ Care Coordinated Applicable Imaging Reviewed Interpretation of Laboratory Data incorporated into ED treatment  The patient appears reasonably screened and/or stabilized for discharge and I doubt any other medical condition or other [facility] requiring further screening, evaluation, or treatment in the ED at this time prior to discharge.  Plan: Home Medications- usual; Home Treatments- rest; return here if the recommended treatment, does not improve the symptoms; Recommended follow up- PCP and Neurosurgery as scheduled     Richarda Blade, MD 11/16/14 1836

## 2014-11-15 NOTE — ED Notes (Signed)
Patient transported to X-ray 

## 2014-11-15 NOTE — ED Notes (Signed)
Per EMS: pt from home for eval of near syncope and fall, pt had back surgery Tuesday and was released home yesterday. Pt states has had intermittent dizziness since this morning, pt reports being in the bathroom at 1830 when she became dizzy and fell forward. Denies any LOC, denies any head injury. No reported blood thinners at this time. EMS noted unremarkable EKG and negative for orthostatic changes. Pt had one episode of vomiting after fall. No meds given. Pt axox4, NAD noted

## 2014-11-21 ENCOUNTER — Other Ambulatory Visit: Payer: Self-pay | Admitting: Family Medicine

## 2015-01-01 ENCOUNTER — Ambulatory Visit (INDEPENDENT_AMBULATORY_CARE_PROVIDER_SITE_OTHER): Payer: Medicare Other | Admitting: Family Medicine

## 2015-01-01 ENCOUNTER — Encounter: Payer: Self-pay | Admitting: Family Medicine

## 2015-01-01 VITALS — BP 148/80 | HR 78 | Temp 97.5°F | Ht 62.0 in | Wt 169.1 lb

## 2015-01-01 DIAGNOSIS — I10 Essential (primary) hypertension: Secondary | ICD-10-CM | POA: Diagnosis not present

## 2015-01-01 DIAGNOSIS — R7303 Prediabetes: Secondary | ICD-10-CM | POA: Insufficient documentation

## 2015-01-01 DIAGNOSIS — E785 Hyperlipidemia, unspecified: Secondary | ICD-10-CM | POA: Diagnosis not present

## 2015-01-01 DIAGNOSIS — R739 Hyperglycemia, unspecified: Secondary | ICD-10-CM

## 2015-01-01 DIAGNOSIS — R7401 Elevation of levels of liver transaminase levels: Secondary | ICD-10-CM

## 2015-01-01 DIAGNOSIS — R740 Nonspecific elevation of levels of transaminase and lactic acid dehydrogenase [LDH]: Secondary | ICD-10-CM | POA: Diagnosis not present

## 2015-01-01 LAB — COMPREHENSIVE METABOLIC PANEL
ALT: 10 U/L (ref 0–35)
AST: 19 U/L (ref 0–37)
Albumin: 4.2 g/dL (ref 3.5–5.2)
Alkaline Phosphatase: 67 U/L (ref 39–117)
BUN: 21 mg/dL (ref 6–23)
CALCIUM: 9.9 mg/dL (ref 8.4–10.5)
CHLORIDE: 103 meq/L (ref 96–112)
CO2: 31 mEq/L (ref 19–32)
Creatinine, Ser: 1 mg/dL (ref 0.40–1.20)
GFR: 56.16 mL/min — AB (ref >60.00)
Glucose, Bld: 90 mg/dL (ref 70–99)
POTASSIUM: 4.4 meq/L (ref 3.5–5.1)
Sodium: 140 mEq/L (ref 135–145)
Total Bilirubin: 0.6 mg/dL (ref 0.2–1.2)
Total Protein: 7.2 g/dL (ref 6.0–8.3)

## 2015-01-01 LAB — LIPID PANEL
CHOL/HDL RATIO: 4
Cholesterol: 243 mg/dL — ABNORMAL HIGH (ref 0–200)
HDL: 59.6 mg/dL (ref >39.00)
LDL Cholesterol: 155 mg/dL — ABNORMAL HIGH (ref 0–99)
NonHDL: 183.4
Triglycerides: 141 mg/dL (ref 0.0–149.0)
VLDL: 28.2 mg/dL (ref 0.0–40.0)

## 2015-01-01 LAB — CBC WITH DIFFERENTIAL/PLATELET
BASOS ABS: 0 10*3/uL (ref 0.0–0.1)
BASOS PCT: 0.4 % (ref 0.0–3.0)
EOS ABS: 0.1 10*3/uL (ref 0.0–0.7)
Eosinophils Relative: 1.3 % (ref 0.0–5.0)
HEMATOCRIT: 43.1 % (ref 36.0–46.0)
Hemoglobin: 14.6 g/dL (ref 12.0–15.0)
LYMPHS ABS: 6.2 10*3/uL — AB (ref 0.7–4.0)
Lymphocytes Relative: 53.5 % — ABNORMAL HIGH (ref 12.0–46.0)
MCHC: 33.8 g/dL (ref 30.0–36.0)
MCV: 84 fl (ref 78.0–100.0)
MONO ABS: 0.9 10*3/uL (ref 0.1–1.0)
Monocytes Relative: 7.6 % (ref 3.0–12.0)
NEUTROS ABS: 4.3 10*3/uL (ref 1.4–7.7)
Neutrophils Relative %: 37.2 % — ABNORMAL LOW (ref 43.0–77.0)
Platelets: 271 10*3/uL (ref 150.0–400.0)
RBC: 5.13 Mil/uL — ABNORMAL HIGH (ref 3.87–5.11)
RDW: 14.3 % (ref 11.5–15.5)
WBC: 11.5 10*3/uL — ABNORMAL HIGH (ref 4.0–10.5)

## 2015-01-01 LAB — HEMOGLOBIN A1C: HEMOGLOBIN A1C: 5.9 % (ref 4.6–6.5)

## 2015-01-01 MED ORDER — AMLODIPINE BESYLATE 10 MG PO TABS: 10.0000 mg | ORAL_TABLET | Freq: Every day | ORAL | Status: DC

## 2015-01-01 NOTE — Progress Notes (Signed)
Pre visit review using our clinic review tool, if applicable. No additional management support is needed unless otherwise documented below in the visit note. 

## 2015-01-01 NOTE — Progress Notes (Signed)
Subjective:    Patient ID: Linda Robinson, female    DOB: July 21, 1929, 81 y.o.   MRN: [id number]  HPI Here for f/u of chronic health problems  bp is up a bit  today (she notes at home it has been 140s-160s  At home)- thinks from pain  No cp or palpitations or headaches or edema  No side effects to medicines  BP Readings from Last 3 Encounters:  01/01/15 148/80  11/15/14 130/68  11/02/14 181/77     Cholesterol - inc pravchol last visit to 20  Tolerating well  Lab Results  Component Value Date   CHOL 230* 08/16/2014   CHOL 260* 06/27/2014   CHOL 218* 04/11/2013   Lab Results  Component Value Date   HDL 60.90 08/16/2014   HDL 73.00 06/27/2014   HDL 57.30 04/11/2013   Lab Results  Component Value Date   LDLCALC 138* 08/16/2014   LDLCALC 167* 06/27/2014   LDLCALC 112* 08/04/2011   Lab Results  Component Value Date   TRIG 157.0* 08/16/2014   TRIG 99.0 06/27/2014   TRIG 154.0* 04/11/2013   Lab Results  Component Value Date   CHOLHDL 4 08/16/2014   CHOLHDL 4 06/27/2014   CHOLHDL 4 04/11/2013   Lab Results  Component Value Date   LDLDIRECT 135.4 04/11/2013   LDLDIRECT 134.0 02/03/2012   LDLDIRECT 150.8 10/30/2010   did improve her numbers  Disc low fat diet - just not as hungry as she used to be anyway  Her back is very painful- ? Not imp since surgery MRI upcoming  Hard to motivate or move around    LFTs also improved after stopping allopurinol Lab Results  Component Value Date   ALT 12 08/16/2014   AST 17 08/16/2014   ALKPHOS 59 08/16/2014   BILITOT 0.7 08/16/2014  no longer on tylenol products   Kidney function Lab Results  Component Value Date   CREATININE 1.05 11/15/2014   BUN 21 11/15/2014   NA 138 11/15/2014   K 3.8 11/15/2014   CL 101 11/15/2014   CO2 31 11/15/2014    Glucose has been borderline  She is worried about that  Does not often eat sweets   Wbc is normally slt high Lab Results  Component Value Date   WBC 12.7* 11/15/2014    HGB 12.1 11/15/2014   HCT 37.2 11/15/2014   MCV 87.3 11/15/2014   PLT 238 11/15/2014     Patient Active Problem List   Diagnosis Date Noted  . Hyperglycemia 01/01/2015  . Lumbar spinal stenosis 11/13/2014  . Vertigo 10/05/2014  . Elevated transaminase level 07/16/2014  . Leukocytosis 07/16/2014  . Risk for falls 06/27/2014  . Fall (on)(from) incline, initial encounter 09/15/2013  . Low back pain on right side with sciatica 07/24/2013  . Edema 04/26/2013  . Encounter for Medicare annual wellness exam 04/11/2013  . Colon cancer screening 02/10/2012  . BRADYCARDIA 05/19/2010  . OSTEOPENIA 01/30/2009  . CONSTIPATION 11/01/2007  . ANXIETY STATE NEC 02/14/2007  . REACTION, ACUTE STRESS W/EMOTIONAL DSTURB 02/14/2007  . Depression with anxiety 02/14/2007  . Hyperlipidemia LDL goal <130 02/04/2007  . GOUT 02/04/2007  . Essential hypertension 02/04/2007  . DIVERTICULOSIS, COLON 02/04/2007  . Renal insufficiency 02/04/2007  . OSTEOARTHRITIS 02/04/2007  . [place] DISEASE 02/04/2007  . LEG CRAMPS 02/04/2007   Past Medical History  Diagnosis Date  . History of diverticulitis of colon   . Diverticulosis of colon   . Gout  hx of;was on Allopurinol but taken off 6 wks ago by medical MD  . HLD (hyperlipidemia)     takes Pravastatin daily  . Osteopenia     takes Calcium and Vit D daily  . GERD (gastroesophageal reflux disease)     atypical chest pain  . Basal cell carcinoma     removed  . HTN (hypertension)     takes Amlodipine and Metoprolol daily  . Vertigo     takes Antivert daily as needed  . Constipation     takes Colace daily  . Depression     takes Zoloft daily  . OA (osteoarthritis)     right knee  . Chronic back pain     stenosis   Past Surgical History  Procedure Laterality Date  . Cataract extraction Right 04/2006  . Cholecystectomy    . Partial colectomy  09/1999    d/t diverticulitis  . Neck surgery      x 2;fusion  . Knee surgery Right      Medial meniscus tear  . Back surgery    . Tubal ligation    . Appendectomy  at age 55  . Colonoscopy    . Lumbar laminectomy/decompression microdiscectomy Bilateral 11/13/2014    Procedure: Lumbar one-two Bilateral laminectomy and foraminotomy, Left Lumbar one-two microdiscectomy ;  Surgeon: Faythe Ghee, MD;  Location: [facility];  Service: Neurosurgery;  Laterality: Bilateral;   History  Substance Use Topics  . Smoking status: Never Smoker   . Smokeless tobacco: Not on file  . Alcohol Use: No   Family History  Problem Relation Age of Onset  . Cancer Father     throat and bladder  . Diabetes Father   . Hypertension Father   . Heart attack Father   . Stroke Mother   . Alzheimer's disease Mother   . Hypertension Mother    Allergies  Allergen Reactions  . Ace Inhibitors     REACTION: increased K+, increased creat.  . Cephalosporins     REACTION: reaction not known  . Colesevelam     REACTION: constipation  . Lovastatin     REACTION: doesn't work  . Raloxifene     REACTION: cramps  . Rosuvastatin     REACTION: myalgia  . Simvastatin     REACTION: myalgia   Current Outpatient Prescriptions on File Prior to Visit  Medication Sig Dispense Refill  . Calcium Carbonate-Vitamin D (CALCIUM + D PO) Take 1 tablet by mouth daily.    . Cranberry 250 MG CAPS Take 1 capsule by mouth daily.      Marland Kitchen docusate sodium (COLACE) 100 MG capsule Take 200 mg by mouth daily.      . fish oil-omega-3 fatty acids 1000 MG capsule Take 1 g by mouth 2 (two) times daily.     Marland Kitchen gabapentin (NEURONTIN) 300 MG capsule Take 1 capsule by mouth 3 (three) times daily.    . meclizine (ANTIVERT) 25 MG tablet Take 25 mg by mouth 4 (four) times daily as needed for dizziness.     . metoprolol succinate (TOPROL-XL) 50 MG 24 hr tablet TAKE 1 TABLET TWICE A DAY (Patient taking differently: TAKE 1 TABLET twice DAILY) 180 tablet 1  . polycarbophil (FIBERCON) 625 MG tablet Take 625 mg by mouth daily as needed for  diarrhea or loose stools.     . pravastatin (PRAVACHOL) 20 MG tablet TAKE ONE-HALF (1/2) TABLET TO 1 TABLET ONCE DAILY 90 tablet 1  . sertraline (ZOLOFT)  50 MG tablet TAKE 1 TABLET DAILY 90 tablet 0   No current facility-administered medications on file prior to visit.    Review of Systems Review of Systems  Constitutional: Negative for fever, appetite change, fatigue and unexpected weight change.  Eyes: Negative for pain and visual disturbance.  Respiratory: Negative for cough and shortness of breath.   Cardiovascular: Negative for cp or palpitations    Gastrointestinal: Negative for nausea, diarrhea and constipation.  Genitourinary: Negative for urgency and frequency.  Skin: Negative for pallor or rash   MSK pos for chronic back pain  Neurological: Negative for weakness, light-headedness, numbness and headaches.  Hematological: Negative for adenopathy. Does not bruise/bleed easily.  Psychiatric/Behavioral: Negative for dysphoric mood. The patient is not nervous/anxious.         Objective:   Physical Exam  Constitutional: She appears well-developed and well-nourished. No distress.  obese and well appearing   HENT:  Head: Normocephalic and atraumatic.  Mouth/Throat: Oropharynx is clear and moist.  Eyes: Conjunctivae and EOM are normal. Pupils are equal, round, and reactive to light. Right eye exhibits no discharge. Left eye exhibits no discharge. No scleral icterus.  Neck: Normal range of motion. Neck supple. No JVD present. Carotid bruit is not present. No thyromegaly present.  Cardiovascular: Normal rate, regular rhythm, normal heart sounds and intact distal pulses.  Exam reveals no gallop.   Pulmonary/Chest: Effort normal and breath sounds normal. No respiratory distress. She has no wheezes. She has no rales.  No crackles  Abdominal: Soft. Bowel sounds are normal. She exhibits no abdominal bruit.  Musculoskeletal: She exhibits no edema.  Lymphadenopathy:    She has no cervical  adenopathy.  Neurological: She is alert. She has normal reflexes.  Skin: Skin is warm and dry. No rash noted.  Psychiatric: She has a normal mood and affect.          Assessment & Plan:   Problem List Items Addressed This Visit      Cardiovascular and Mediastinum   Essential hypertension - Primary    bp is not at goal  BP: (!) 148/80 mmHg    Rev DASH diet guidelines Inc norvasc to 10 mg daily  Rev poss side eff/ inst to update  F/u planned       Relevant Medications   amLODIpine (NORVASC) tablet   Other Relevant Orders   CBC with Differential/Platelet (Completed)   Comprehensive metabolic panel (Completed)   Lipid panel (Completed)     Other   Elevated transaminase level    Labs today      Relevant Orders   Comprehensive metabolic panel (Completed)   Hyperglycemia    Lab Results  Component Value Date   HGBA1C 5.9 01/01/2015   Stable and well controlled with diet       Relevant Orders   Hemoglobin A1c (Completed)   Hyperlipidemia LDL goal <130    Lab today  Disc goals for lipids and reasons to control them Rev labs with pt from last check  Rev low sat fat diet in detail       Relevant Medications   amLODIpine (NORVASC) tablet   Other Relevant Orders   Lipid panel (Completed)

## 2015-01-01 NOTE — Patient Instructions (Signed)
Increase amlodipine to 10 mg once daily (double up on the 5's until you run out and new px is for 10 mg)  Alert me if any side effects Lab today  Continue low fat diet (Avoid red meat/ fried foods/ egg yolks/ fatty breakfast meats/ butter, cheese and high fat dairy/ and shellfish)  Follow up with me in about 3 months

## 2015-01-03 NOTE — Assessment & Plan Note (Signed)
Labs today

## 2015-01-03 NOTE — Assessment & Plan Note (Signed)
bp is not at goal  BP: (!) 148/80 mmHg    Rev DASH diet guidelines Inc norvasc to 10 mg daily  Rev poss side eff/ inst to update  F/u planned

## 2015-01-03 NOTE — Assessment & Plan Note (Signed)
Lab Results  Component Value Date   HGBA1C 5.9 01/01/2015   Stable and well controlled with diet

## 2015-01-03 NOTE — Assessment & Plan Note (Signed)
Lab today  Disc goals for lipids and reasons to control them Rev labs with pt from last check  Rev low sat fat diet in detail

## 2015-01-22 ENCOUNTER — Other Ambulatory Visit: Payer: Self-pay | Admitting: Family Medicine

## 2015-01-22 MED ORDER — METOPROLOL SUCCINATE ER 50 MG PO TB24: 50.0000 mg | ORAL_TABLET | Freq: Two times a day (BID) | ORAL | Status: DC

## 2015-01-22 NOTE — Addendum Note (Signed)
Addended by: Tammi Sou on: 01/22/2015 12:56 PM   Modules accepted: Orders

## 2015-01-22 NOTE — Telephone Encounter (Signed)
Received at E-prescribing error so Rx resent

## 2015-01-25 ENCOUNTER — Other Ambulatory Visit: Payer: Self-pay | Admitting: Family Medicine

## 2015-01-25 ENCOUNTER — Ambulatory Visit (INDEPENDENT_AMBULATORY_CARE_PROVIDER_SITE_OTHER): Payer: Medicare Other | Admitting: Family Medicine

## 2015-01-25 ENCOUNTER — Encounter: Payer: Self-pay | Admitting: Family Medicine

## 2015-01-25 VITALS — BP 142/78 | HR 55 | Temp 98.4°F | Ht 62.0 in | Wt 169.8 lb

## 2015-01-25 DIAGNOSIS — R609 Edema, unspecified: Secondary | ICD-10-CM | POA: Diagnosis not present

## 2015-01-25 DIAGNOSIS — I10 Essential (primary) hypertension: Secondary | ICD-10-CM

## 2015-01-25 MED ORDER — FUROSEMIDE 20 MG PO TABS: ORAL_TABLET | ORAL | Status: DC

## 2015-01-25 NOTE — Patient Instructions (Signed)
Take lasix 20 mg daily for swelling  Elevate legs when sitting  Avoid excessive sodium in diet-salt and processed foods Make sure you drink enough water Plan labs in 2 weeks for potassium  Let me know if this is not helpful  When cooler-wear your support hose

## 2015-01-25 NOTE — Progress Notes (Signed)
Subjective:    Patient ID: Linda Robinson, female    DOB: 10/31/1928, 81 y.o.   MRN: [id number]  HPI Here with swelling of feet and ankles About 2 weeks  On and off - much better in am or off of feet and elevating them They feel hot/shoes are tight    bp is stable today  No cp or palpitations or headaches or edema  No side effects to medicines  BP Readings from Last 3 Encounters:  01/25/15 142/78  01/01/15 148/80  11/15/14 130/68     On amolodpine and beta blocker  Has tried prn furosemide - a little helpful - took 3 of them when this first started  Does not think she could tolerate supp hose in the summer -too hot   No chest pain or trouble breathing   In past was on fluid pills - and was taken off of them  Renal insuff  Lab Results  Component Value Date   CREATININE 1.00 01/01/2015   BUN 21 01/01/2015   NA 140 01/01/2015   K 4.4 01/01/2015   CL 103 01/01/2015   CO2 31 01/01/2015    Lab Results  Component Value Date   ALT 10 01/01/2015   AST 19 01/01/2015   ALKPHOS 67 01/01/2015   BILITOT 0.6 01/01/2015   Had back surgery recently  Still has terrible low back pain and also some tingling in leg  Given a back brace - has not worn it yet     Patient Active Problem List   Diagnosis Date Noted  . Hyperglycemia 01/01/2015  . Lumbar spinal stenosis 11/13/2014  . Vertigo 10/05/2014  . Elevated transaminase level 07/16/2014  . Leukocytosis 07/16/2014  . Risk for falls 06/27/2014  . Fall (on)(from) incline, initial encounter 09/15/2013  . Low back pain on right side with sciatica 07/24/2013  . Edema 04/26/2013  . Encounter for Medicare annual wellness exam 04/11/2013  . Colon cancer screening 02/10/2012  . BRADYCARDIA 05/19/2010  . OSTEOPENIA 01/30/2009  . CONSTIPATION 11/01/2007  . ANXIETY STATE NEC 02/14/2007  . REACTION, ACUTE STRESS W/EMOTIONAL DSTURB 02/14/2007  . Depression with anxiety 02/14/2007  . Hyperlipidemia LDL goal <130 02/04/2007  . GOUT  02/04/2007  . Essential hypertension 02/04/2007  . DIVERTICULOSIS, COLON 02/04/2007  . Renal insufficiency 02/04/2007  . OSTEOARTHRITIS 02/04/2007  . [place] DISEASE 02/04/2007  . LEG CRAMPS 02/04/2007   Past Medical History  Diagnosis Date  . History of diverticulitis of colon   . Diverticulosis of colon   . Gout     hx of;was on Allopurinol but taken off 6 wks ago by medical MD  . HLD (hyperlipidemia)     takes Pravastatin daily  . Osteopenia     takes Calcium and Vit D daily  . GERD (gastroesophageal reflux disease)     atypical chest pain  . Basal cell carcinoma     removed  . HTN (hypertension)     takes Amlodipine and Metoprolol daily  . Vertigo     takes Antivert daily as needed  . Constipation     takes Colace daily  . Depression     takes Zoloft daily  . OA (osteoarthritis)     right knee  . Chronic back pain     stenosis   Past Surgical History  Procedure Laterality Date  . Cataract extraction Right 04/2006  . Cholecystectomy    . Partial colectomy  09/1999    d/t diverticulitis  . Neck surgery  x 2;fusion  . Knee surgery Right     Medial meniscus tear  . Back surgery    . Tubal ligation    . Appendectomy  at age 37  . Colonoscopy    . Lumbar laminectomy/decompression microdiscectomy Bilateral 11/13/2014    Procedure: Lumbar one-two Bilateral laminectomy and foraminotomy, Left Lumbar one-two microdiscectomy ;  Surgeon: Faythe Ghee, MD;  Location: [facility];  Service: Neurosurgery;  Laterality: Bilateral;   History  Substance Use Topics  . Smoking status: Never Smoker   . Smokeless tobacco: Not on file  . Alcohol Use: No   Family History  Problem Relation Age of Onset  . Cancer Father     throat and bladder  . Diabetes Father   . Hypertension Father   . Heart attack Father   . Stroke Mother   . Alzheimer's disease Mother   . Hypertension Mother    Allergies  Allergen Reactions  . Ace Inhibitors     REACTION: increased  K+, increased creat.  . Cephalosporins     REACTION: reaction not known  . Colesevelam     REACTION: constipation  . Lovastatin     REACTION: doesn't work  . Raloxifene     REACTION: cramps  . Rosuvastatin     REACTION: myalgia  . Simvastatin     REACTION: myalgia   Current Outpatient Prescriptions on File Prior to Visit  Medication Sig Dispense Refill  . amLODipine (NORVASC) 10 MG tablet Take 1 tablet (10 mg total) by mouth daily. 90 tablet 3  . Calcium Carbonate-Vitamin D (CALCIUM + D PO) Take 1 tablet by mouth daily.    . Cranberry 250 MG CAPS Take 1 capsule by mouth daily.      Marland Kitchen docusate sodium (COLACE) 100 MG capsule Take 200 mg by mouth daily.      . fish oil-omega-3 fatty acids 1000 MG capsule Take 1 g by mouth 2 (two) times daily.     Marland Kitchen gabapentin (NEURONTIN) 300 MG capsule Take 1 capsule by mouth 3 (three) times daily.    . meclizine (ANTIVERT) 25 MG tablet Take 25 mg by mouth 4 (four) times daily as needed for dizziness.     . metoprolol succinate (TOPROL-XL) 50 MG 24 hr tablet Take 1 tablet (50 mg total) by mouth 2 (two) times daily. Take with or immediately following a meal. (Patient taking differently: Take 50 mg by mouth daily. Take with or immediately following a meal.) 180 tablet 1  . polycarbophil (FIBERCON) 625 MG tablet Take 625 mg by mouth daily as needed for diarrhea or loose stools.     . sertraline (ZOLOFT) 50 MG tablet TAKE 1 TABLET DAILY 90 tablet 0   No current facility-administered medications on file prior to visit.    Review of Systems Review of Systems  Constitutional: Negative for fever, appetite change, fatigue and unexpected weight change.  Eyes: Negative for pain and visual disturbance.  Respiratory: Negative for cough and shortness of breath.   Cardiovascular: Negative for cp or palpitations   neg for PND or orthopnea  Gastrointestinal: Negative for nausea, diarrhea and constipation.  Genitourinary: Negative for urgency and frequency.  MSK pos  for acute on chronic back pain  Skin: Negative for pallor or rash   Neurological: Negative for weakness, light-headedness, numbness and headaches.  Hematological: Negative for adenopathy. Does not bruise/bleed easily.  Psychiatric/Behavioral: Negative for dysphoric mood. The patient is not nervous/anxious.         Objective:  Physical Exam  Constitutional: She appears well-developed and well-nourished. No distress.  obese and well appearing   HENT:  Head: Normocephalic and atraumatic.  Mouth/Throat: Oropharynx is clear and moist.  Eyes: Conjunctivae and EOM are normal. Pupils are equal, round, and reactive to light.  Neck: Normal range of motion. Neck supple. No JVD present. Carotid bruit is not present. No thyromegaly present.  Cardiovascular: Normal rate, regular rhythm, normal heart sounds and intact distal pulses.  Exam reveals no gallop.   Pulmonary/Chest: Effort normal and breath sounds normal. No respiratory distress. She has no wheezes. She has no rales.  No crackles  Abdominal: Soft. Bowel sounds are normal. She exhibits no distension, no abdominal bruit and no mass. There is no tenderness.  Musculoskeletal: She exhibits edema.  Trace to 1 plus pedal edema that is pitting   Lymphadenopathy:    She has no cervical adenopathy.  Neurological: She is alert. She has normal reflexes.  Skin: Skin is warm and dry. No rash noted.  Psychiatric: She has a normal mood and affect.          Assessment & Plan:   Problem List Items Addressed This Visit      Cardiovascular and Mediastinum   Essential hypertension - Primary    bp in fair control at this time  BP Readings from Last 1 Encounters:  01/25/15 142/78   No changes needed Disc lifstyle change with low sodium diet and exercise  meds may cause some of her edema  Adding lasix today      Relevant Medications   furosemide (LASIX) 20 MG tablet     Other   Edema    Pedal edema -worsened by her bp meds and venous  insufficiency  Add lasix 20 mg daily  Lab in 2 wk  Disc elevation of legs/use of support hose and low sodium eating/ water intake

## 2015-01-25 NOTE — Progress Notes (Signed)
Pre visit review using our clinic review tool, if applicable. No additional management support is needed unless otherwise documented below in the visit note. 

## 2015-01-27 NOTE — Assessment & Plan Note (Signed)
Pedal edema -worsened by her bp meds and venous insufficiency  Add lasix 20 mg daily  Lab in 2 wk  Disc elevation of legs/use of support hose and low sodium eating/ water intake

## 2015-01-27 NOTE — Assessment & Plan Note (Signed)
bp in fair control at this time  BP Readings from Last 1 Encounters:  01/25/15 142/78   No changes needed Disc lifstyle change with low sodium diet and exercise  meds may cause some of her edema  Adding lasix today

## 2015-02-08 ENCOUNTER — Other Ambulatory Visit (INDEPENDENT_AMBULATORY_CARE_PROVIDER_SITE_OTHER): Payer: Medicare Other

## 2015-02-08 DIAGNOSIS — R609 Edema, unspecified: Secondary | ICD-10-CM | POA: Diagnosis not present

## 2015-02-08 LAB — BASIC METABOLIC PANEL
BUN: 19 mg/dL (ref 6–23)
CALCIUM: 9.8 mg/dL (ref 8.4–10.5)
CO2: 31 mEq/L (ref 19–32)
Chloride: 101 mEq/L (ref 96–112)
Creatinine, Ser: 1.02 mg/dL (ref 0.40–1.20)
GFR: 54.88 mL/min — ABNORMAL LOW (ref >60.00)
Glucose, Bld: 93 mg/dL (ref 70–99)
POTASSIUM: 4.2 meq/L (ref 3.5–5.1)
Sodium: 138 mEq/L (ref 135–145)

## 2015-02-18 ENCOUNTER — Other Ambulatory Visit: Payer: Self-pay | Admitting: Family Medicine

## 2015-02-18 NOTE — Telephone Encounter (Signed)
Please refill until appt  

## 2015-02-18 NOTE — Telephone Encounter (Signed)
done

## 2015-02-18 NOTE — Telephone Encounter (Signed)
Electronic refill request, pt has CPE scheduled for 07/05/15 and last refilled on 11/21/14 #90 with 0 refills, please advise

## 2015-03-04 ENCOUNTER — Other Ambulatory Visit: Payer: Self-pay | Admitting: Family Medicine

## 2015-03-23 ENCOUNTER — Encounter: Payer: Self-pay | Admitting: Internal Medicine

## 2015-03-23 ENCOUNTER — Ambulatory Visit (INDEPENDENT_AMBULATORY_CARE_PROVIDER_SITE_OTHER): Payer: Medicare Other | Admitting: Internal Medicine

## 2015-03-23 VITALS — BP 132/68 | Temp 98.5°F | Ht 62.0 in | Wt 168.0 lb

## 2015-03-23 DIAGNOSIS — N390 Urinary tract infection, site not specified: Secondary | ICD-10-CM

## 2015-03-23 DIAGNOSIS — R300 Dysuria: Secondary | ICD-10-CM

## 2015-03-23 LAB — POCT URINALYSIS DIPSTICK
Bilirubin, UA: NEGATIVE
Glucose, UA: NEGATIVE
KETONES UA: NEGATIVE
Nitrite, UA: NEGATIVE
Spec Grav, UA: 1.02
UROBILINOGEN UA: 1
pH, UA: 6

## 2015-03-23 MED ORDER — CIPROFLOXACIN HCL 500 MG PO TABS: 500.0000 mg | ORAL_TABLET | Freq: Two times a day (BID) | ORAL | Status: DC

## 2015-03-23 NOTE — Progress Notes (Signed)
Pre visit review using our clinic review tool, if applicable. No additional management support is needed unless otherwise documented below in the visit note.   Chief Complaint  Patient presents with  . Dysuria    HPI: Patient comes in today for [facility] Saturday clinic for  new problem evaluation. Onset today   Of acute   Dysuria and  hematuria . No fever   Som discomfort no vomiting chills flank pain . Had back problems  No new back issues  Last uti a while back cant take cephal and cipro works well ROS: See pertinent positives and negatives per HPI.  Past Medical History  Diagnosis Date  . History of diverticulitis of colon   . Diverticulosis of colon   . Gout     hx of;was on Allopurinol but taken off 6 wks ago by medical MD  . HLD (hyperlipidemia)     takes Pravastatin daily  . Osteopenia     takes Calcium and Vit D daily  . GERD (gastroesophageal reflux disease)     atypical chest pain  . Basal cell carcinoma     removed  . HTN (hypertension)     takes Amlodipine and Metoprolol daily  . Vertigo     takes Antivert daily as needed  . Constipation     takes Colace daily  . Depression     takes Zoloft daily  . OA (osteoarthritis)     right knee  . Chronic back pain     stenosis    Family History  Problem Relation Age of Onset  . Cancer Father     throat and bladder  . Diabetes Father   . Hypertension Father   . Heart attack Father   . Stroke Mother   . Alzheimer's disease Mother   . Hypertension Mother     History   Social History  . Marital Status: Married    Spouse Name: N/A  . Number of Children: N/A  . Years of Education: N/A   Occupational History  . Retired    Social History Main Topics  . Smoking status: Never Smoker   . Smokeless tobacco: Not on file  . Alcohol Use: No  . Drug Use: No  . Sexual Activity: Not Currently    Birth Control/ Protection: Post-menopausal   Other Topics Concern  . None   Social History Narrative    Outpatient  Prescriptions Prior to Visit  Medication Sig Dispense Refill  . amLODipine (NORVASC) 10 MG tablet Take 1 tablet (10 mg total) by mouth daily. 90 tablet 3  . Calcium Carbonate-Vitamin D (CALCIUM + D PO) Take 1 tablet by mouth daily.    . Cranberry 250 MG CAPS Take 1 capsule by mouth daily.      Marland Kitchen docusate sodium (COLACE) 100 MG capsule Take 200 mg by mouth daily.      . fish oil-omega-3 fatty acids 1000 MG capsule Take 1 g by mouth 2 (two) times daily.     . furosemide (LASIX) 20 MG tablet Take 1 pill by mouth once daily as needed for swelling 90 tablet 3  . gabapentin (NEURONTIN) 300 MG capsule Take 1 capsule by mouth 3 (three) times daily.    . meclizine (ANTIVERT) 25 MG tablet Take 25 mg by mouth 4 (four) times daily as needed for dizziness.     . metoprolol succinate (TOPROL-XL) 50 MG 24 hr tablet Take 1 tablet (50 mg total) by mouth 2 (two) times daily. Take with or  immediately following a meal. (Patient taking differently: Take 50 mg by mouth daily. Take with or immediately following a meal.) 180 tablet 1  . polycarbophil (FIBERCON) 625 MG tablet Take 625 mg by mouth daily as needed for diarrhea or loose stools.     . pravastatin (PRAVACHOL) 20 MG tablet TAKE ONE-HALF (1/2) TABLET TO ONE TABLET DAILY (Patient taking differently: TAKE ONE TABLET TO ONE TABLET DAILY) 90 tablet 1  . sertraline (ZOLOFT) 50 MG tablet TAKE 1 TABLET DAILY 90 tablet 1   No facility-administered medications prior to visit.     EXAM:  BP 132/68 mmHg  Temp(Src) 98.5 F (36.9 C) (Oral)  Ht 5\' 2"  (1.575 m)  Wt 168 lb (76.204 kg)  BMI 30.72 kg/m2  Body mass index is 30.72 kg/(m^2).  GENERAL: vitals reviewed and listed above, alert, oriented, appears well hydrated and in no acute distress independent with cane HEENT: atraumatic, conjunctiva  clear, no obvious abnormalities on inspection of external nose and ears NECK: no obvious masses on inspection palpation  CV: HRRR, no clubbing cyanosis  nl cap refill   Abdomen:  Sof,t normal bowel sounds without hepatosplenomegaly, no guarding rebound or masses no CVA tenderness MS: moves all extremities  PSYCH: pleasant and cooperative, no obvious depression or anxiety ua po leuk po blood  Urine looks pink  ASSESSMENT AND PLAN:  Discussed the following assessment and plan:  UTI (lower urinary tract infection)  Dysuria - Plan: POCT urinalysis dipstick, Culture, Urine, POCT urinalysis dipstick Can take cephalo  Says does  well on cipro  Although  second line  -Patient advised to return or notify health care team  if symptoms worsen ,persist or new concerns arise.  Patient Instructions  This acts  Like a  Bladder infection . Should get better in the next 1-3 days  Contact you pcp office if needed. Will notify you  When culture available  .  Urinary Tract Infection Urinary tract infections (UTIs) can develop anywhere along your urinary tract. Your urinary tract is your body's drainage system for removing wastes and extra water. Your urinary tract includes two kidneys, two ureters, a bladder, and a urethra. Your kidneys are a pair of bean-shaped organs. Each kidney is about the size of your fist. They are located below your ribs, one on each side of your spine. CAUSES Infections are caused by microbes, which are microscopic organisms, including fungi, viruses, and bacteria. These organisms are so small that they can only be seen through a microscope. Bacteria are the microbes that most commonly cause UTIs. SYMPTOMS  Symptoms of UTIs may vary by age and gender of the patient and by the location of the infection. Symptoms in young women typically include a frequent and intense urge to urinate and a painful, burning feeling in the bladder or urethra during urination. Older women and men are more likely to be tired, shaky, and weak and have muscle aches and abdominal pain. A fever may mean the infection is in your kidneys. Other symptoms of a kidney infection  include pain in your back or sides below the ribs, nausea, and vomiting. DIAGNOSIS To diagnose a UTI, your caregiver will ask you about your symptoms. Your caregiver also will ask to provide a urine sample. The urine sample will be tested for bacteria and white blood cells. White blood cells are made by your body to help fight infection. TREATMENT  Typically, UTIs can be treated with medication. Because most UTIs are caused by a bacterial infection, they  usually can be treated with the use of antibiotics. The choice of antibiotic and length of treatment depend on your symptoms and the type of bacteria causing your infection. HOME CARE INSTRUCTIONS  If you were prescribed antibiotics, take them exactly as your caregiver instructs you. Finish the medication even if you feel better after you have only taken some of the medication.  Drink enough water and fluids to keep your urine clear or pale yellow.  Avoid caffeine, tea, and carbonated beverages. They tend to irritate your bladder.  Empty your bladder often. Avoid holding urine for long periods of time.  Empty your bladder before and after sexual intercourse.  After a bowel movement, women should cleanse from front to back. Use each tissue only once. SEEK MEDICAL CARE IF:   You have back pain.  You develop a fever.  Your symptoms do not begin to resolve within 3 days. SEEK IMMEDIATE MEDICAL CARE IF:   You have severe back pain or lower abdominal pain.  You develop chills.  You have nausea or vomiting.  You have continued burning or discomfort with urination. MAKE SURE YOU:   Understand these instructions.  Will watch your condition.  Will get help right away if you are not doing well or get worse. Document Released: 06/24/2005 Document Revised: 03/15/2012 Document Reviewed: 10/23/2011 [facility] Patient Information 2015 [place], [place]. This information is not intended to replace advice given to you by your health care  provider. Make sure you discuss any questions you have with your health care provider.       Standley Brooking. Ramisa Duman M.D.

## 2015-03-23 NOTE — Patient Instructions (Signed)
This acts  Like a  Bladder infection . Should get better in the next 1-3 days  Contact you pcp office if needed. Will notify you  When culture available  .  Urinary Tract Infection Urinary tract infections (UTIs) can develop anywhere along your urinary tract. Your urinary tract is your body's drainage system for removing wastes and extra water. Your urinary tract includes two kidneys, two ureters, a bladder, and a urethra. Your kidneys are a pair of bean-shaped organs. Each kidney is about the size of your fist. They are located below your ribs, one on each side of your spine. CAUSES Infections are caused by microbes, which are microscopic organisms, including fungi, viruses, and bacteria. These organisms are so small that they can only be seen through a microscope. Bacteria are the microbes that most commonly cause UTIs. SYMPTOMS  Symptoms of UTIs may vary by age and gender of the patient and by the location of the infection. Symptoms in young women typically include a frequent and intense urge to urinate and a painful, burning feeling in the bladder or urethra during urination. Older women and men are more likely to be tired, shaky, and weak and have muscle aches and abdominal pain. A fever may mean the infection is in your kidneys. Other symptoms of a kidney infection include pain in your back or sides below the ribs, nausea, and vomiting. DIAGNOSIS To diagnose a UTI, your caregiver will ask you about your symptoms. Your caregiver also will ask to provide a urine sample. The urine sample will be tested for bacteria and white blood cells. White blood cells are made by your body to help fight infection. TREATMENT  Typically, UTIs can be treated with medication. Because most UTIs are caused by a bacterial infection, they usually can be treated with the use of antibiotics. The choice of antibiotic and length of treatment depend on your symptoms and the type of bacteria causing your infection. HOME CARE  INSTRUCTIONS  If you were prescribed antibiotics, take them exactly as your caregiver instructs you. Finish the medication even if you feel better after you have only taken some of the medication.  Drink enough water and fluids to keep your urine clear or pale yellow.  Avoid caffeine, tea, and carbonated beverages. They tend to irritate your bladder.  Empty your bladder often. Avoid holding urine for long periods of time.  Empty your bladder before and after sexual intercourse.  After a bowel movement, women should cleanse from front to back. Use each tissue only once. SEEK MEDICAL CARE IF:   You have back pain.  You develop a fever.  Your symptoms do not begin to resolve within 3 days. SEEK IMMEDIATE MEDICAL CARE IF:   You have severe back pain or lower abdominal pain.  You develop chills.  You have nausea or vomiting.  You have continued burning or discomfort with urination. MAKE SURE YOU:   Understand these instructions.  Will watch your condition.  Will get help right away if you are not doing well or get worse. Document Released: 06/24/2005 Document Revised: 03/15/2012 Document Reviewed: 10/23/2011 [facility] Patient Information 2015 [place], [place]. This information is not intended to replace advice given to you by your health care provider. Make sure you discuss any questions you have with your health care provider.

## 2015-03-25 ENCOUNTER — Telehealth: Payer: Self-pay

## 2015-03-25 ENCOUNTER — Telehealth: Payer: Self-pay | Admitting: Family Medicine

## 2015-03-25 NOTE — Telephone Encounter (Signed)
Pt was seen at [facility] clinic on 03/23/15.

## 2015-03-25 NOTE — Telephone Encounter (Signed)
Agree with advisement to be seen

## 2015-03-25 NOTE — Telephone Encounter (Signed)
PLEASE NOTE: All timestamps contained within this report are represented as [place] Standard Time. CONFIDENTIALTY NOTICE: This fax transmission is intended only for the addressee. It contains information that is legally privileged, confidential or otherwise protected from use or disclosure. If you are not the intended recipient, you are strictly prohibited from reviewing, disclosing, copying using or disseminating any of this information or taking any action in reliance on or regarding this information. If you have received this fax in error, please notify us immediately by telephone so that we can arrange for its return to [place]. Phone: [phone], Toll-Free: [phone], Fax: [phone] Page: 1 of 2 Call Id: [id number] [place] Patient Name: Linda Robinson Gender: Female DOB: August 16, 1929 Age: 81 Y 1 M 3 D Return Phone Number: [phone] (Primary) Address: City/State/Zip: [place] Client [place] Night - Client Client Site [facility] [place] Contact Type Call Call Type Triage / Clinical Relationship To Patient Self Return Phone Number [phone] (Primary) Chief Complaint Urine, Blood In Initial Comment Caller states woke up with a bladder infection with blood in her urine PreDisposition Go to Urgent Care/Walk-In Clinic Nurse Assessment Nurse: Si Gaul, RN, Tuesday Date/Time Eilene Ghazi Time): 03/23/2015 9:34:34 AM Confirm and document reason for call. If symptomatic, describe symptoms. ---Caller states she believes she woke with a bladder infection and has urine in her blood. Has the patient traveled out of the country within the last 30 days? ---No Does the patient require triage? ---Yes Related visit to physician within the last 2 weeks? ---No Does the PT have any chronic conditions? (i.e. diabetes, asthma,  etc.) ---Yes List chronic conditions. ---Back problems and high blood pressure Guidelines Guideline Title Affirmed Question Affirmed Notes Nurse Date/Time (Eastern Time) Urine - Blood In [1] Unable to urinate (or only a few drops) > 4 hours AND [2] bladder feels very full (e.g., palpable bladder or strong urge to urinate) Scalf, RN, Tuesday 03/23/2015 9:35:30 AM Disp. Time Eilene Ghazi Time) Disposition Final User 03/23/2015 9:38:31 AM Go to ED Now Yes Scalf, RN, Tuesday Caller Understands: Yes Disagree/Comply: Comply PLEASE NOTE: All timestamps contained within this report are represented as [place] Standard Time. CONFIDENTIALTY NOTICE: This fax transmission is intended only for the addressee. It contains information that is legally privileged, confidential or otherwise protected from use or disclosure. If you are not the intended recipient, you are strictly prohibited from reviewing, disclosing, copying using or disseminating any of this information or taking any action in reliance on or regarding this information. If you have received this fax in error, please notify us immediately by telephone so that we can arrange for its return to [place]. Phone: [phone], Toll-Free: [phone], Fax: [phone] Page: 2 of 2 Call Id: [id number] Care Advice Given Per Guideline GO TO ED NOW: You need to be seen in the Emergency Department. Go to the ER at ___________ [place] now. Drive carefully. CARE ADVICE given per Urine, Blood In (Adult) guideline. Patient states she will go to the Urgent Care Clinic After Care Instructions Given Call Event Type User Date / Time Description Referrals [facility]

## 2015-03-26 LAB — URINE CULTURE

## 2015-04-01 ENCOUNTER — Other Ambulatory Visit: Payer: Self-pay | Admitting: Internal Medicine

## 2015-04-02 NOTE — Telephone Encounter (Signed)
Denied.  Pt should contact her PCP for refills.

## 2015-04-05 ENCOUNTER — Other Ambulatory Visit: Payer: Self-pay | Admitting: Internal Medicine

## 2015-04-08 ENCOUNTER — Encounter: Payer: Self-pay | Admitting: Family Medicine

## 2015-04-08 ENCOUNTER — Ambulatory Visit (INDEPENDENT_AMBULATORY_CARE_PROVIDER_SITE_OTHER): Payer: Medicare Other | Admitting: Family Medicine

## 2015-04-08 ENCOUNTER — Telehealth: Payer: Self-pay

## 2015-04-08 VITALS — BP 150/64 | HR 54 | Temp 98.2°F | Ht 62.0 in | Wt 168.8 lb

## 2015-04-08 DIAGNOSIS — I10 Essential (primary) hypertension: Secondary | ICD-10-CM | POA: Diagnosis not present

## 2015-04-08 DIAGNOSIS — R609 Edema, unspecified: Secondary | ICD-10-CM | POA: Diagnosis not present

## 2015-04-08 NOTE — Progress Notes (Signed)
Pre visit review using our clinic review tool, if applicable. No additional management support is needed unless otherwise documented below in the visit note. 

## 2015-04-08 NOTE — Assessment & Plan Note (Signed)
Pt has felt poorly since last refill of her amlodipine from mail order (? Changed generic)- she will clarify with them and let me know  For now will hold it  Rev other med BP: (!) 150/64 mmHg    Off all med today

## 2015-04-08 NOTE — Telephone Encounter (Signed)
Pt notified of Dr. Tower's instructions and verbalized understanding  

## 2015-04-08 NOTE — Assessment & Plan Note (Signed)
Much improved with addn of lasix  Rev labs Continue to monitor this and bp

## 2015-04-08 NOTE — Progress Notes (Signed)
Subjective:    Patient ID: Linda Robinson, female    DOB: [birth year], 81 y.o.   MRN: [id number]  HPI Here for f/u of HTN and edema   BP Readings from Last 3 Encounters:  04/08/15 150/64  03/23/15 132/68  01/25/15 142/78   has not taken her bp med this am  bp at home recently went up to 162/68 Has had lower diastolic reading (66-06T)  Noted her amlodipine  generic changed (her color and shape of pill)- she will check with her pharmacy and ask them   Feels drained and overall weak and "shaky" She has been eating regular meals   Pulse in 50-60s   Edema (added lasix)- helped a lot  Lab Results  Component Value Date   CREATININE 1.02 02/08/2015   BUN 19 02/08/2015   NA 138 02/08/2015   K 4.2 02/08/2015   CL 101 02/08/2015   CO2 31 02/08/2015     Patient Active Problem List   Diagnosis Date Noted  . Hyperglycemia 01/01/2015  . Lumbar spinal stenosis 11/13/2014  . Vertigo 10/05/2014  . Elevated transaminase level 07/16/2014  . Leukocytosis 07/16/2014  . Risk for falls 06/27/2014  . Fall (on)(from) incline, initial encounter 09/15/2013  . Low back pain on right side with sciatica 07/24/2013  . Edema 04/26/2013  . Encounter for Medicare annual wellness exam 04/11/2013  . Colon cancer screening 02/10/2012  . BRADYCARDIA 05/19/2010  . OSTEOPENIA 01/30/2009  . CONSTIPATION 11/01/2007  . ANXIETY STATE NEC 02/14/2007  . REACTION, ACUTE STRESS W/EMOTIONAL DSTURB 02/14/2007  . Depression with anxiety 02/14/2007  . Hyperlipidemia LDL goal <130 02/04/2007  . GOUT 02/04/2007  . Essential hypertension 02/04/2007  . DIVERTICULOSIS, COLON 02/04/2007  . Renal insufficiency 02/04/2007  . OSTEOARTHRITIS 02/04/2007  . [place] DISEASE 02/04/2007  . LEG CRAMPS 02/04/2007   Past Medical History  Diagnosis Date  . History of diverticulitis of colon   . Diverticulosis of colon   . Gout     hx of;was on Allopurinol but taken off 6 wks ago by medical MD  . HLD  (hyperlipidemia)     takes Pravastatin daily  . Osteopenia     takes Calcium and Vit D daily  . GERD (gastroesophageal reflux disease)     atypical chest pain  . Basal cell carcinoma     removed  . HTN (hypertension)     takes Amlodipine and Metoprolol daily  . Vertigo     takes Antivert daily as needed  . Constipation     takes Colace daily  . Depression     takes Zoloft daily  . OA (osteoarthritis)     right knee  . Chronic back pain     stenosis   Past Surgical History  Procedure Laterality Date  . Cataract extraction Right 04/2006  . Cholecystectomy    . Partial colectomy  09/1999    d/t diverticulitis  . Neck surgery      x 2;fusion  . Knee surgery Right     Medial meniscus tear  . Back surgery    . Tubal ligation    . Appendectomy  at age 40  . Colonoscopy    . Lumbar laminectomy/decompression microdiscectomy Bilateral 11/13/2014    Procedure: Lumbar one-two Bilateral laminectomy and foraminotomy, Left Lumbar one-two microdiscectomy ;  Surgeon: Faythe Ghee, MD;  Location: [facility];  Service: Neurosurgery;  Laterality: Bilateral;   History  Substance Use Topics  . Smoking status: Never Smoker   .  Smokeless tobacco: Not on file  . Alcohol Use: No   Family History  Problem Relation Age of Onset  . Cancer Father     throat and bladder  . Diabetes Father   . Hypertension Father   . Heart attack Father   . Stroke Mother   . Alzheimer's disease Mother   . Hypertension Mother    Allergies  Allergen Reactions  . Ace Inhibitors     REACTION: increased K+, increased creat.  . Cephalosporins     REACTION: reaction not known  . Colesevelam     REACTION: constipation  . Lovastatin     REACTION: doesn't work  . Raloxifene     REACTION: cramps  . Rosuvastatin     REACTION: myalgia  . Simvastatin     REACTION: myalgia   Current Outpatient Prescriptions on File Prior to Visit  Medication Sig Dispense Refill  . amLODipine (NORVASC) 10 MG tablet Take 1  tablet (10 mg total) by mouth daily. 90 tablet 3  . Calcium Carbonate-Vitamin D (CALCIUM + D PO) Take 1 tablet by mouth daily.    . Cranberry 250 MG CAPS Take 1 capsule by mouth daily.      Marland Kitchen docusate sodium (COLACE) 100 MG capsule Take 200 mg by mouth daily.      . fish oil-omega-3 fatty acids 1000 MG capsule Take 1 g by mouth 2 (two) times daily.     . furosemide (LASIX) 20 MG tablet Take 1 pill by mouth once daily as needed for swelling 90 tablet 3  . gabapentin (NEURONTIN) 300 MG capsule Take 1 capsule by mouth 3 (three) times daily.    . meclizine (ANTIVERT) 25 MG tablet Take 25 mg by mouth 4 (four) times daily as needed for dizziness.     . metoprolol succinate (TOPROL-XL) 50 MG 24 hr tablet Take 1 tablet (50 mg total) by mouth 2 (two) times daily. Take with or immediately following a meal. (Patient taking differently: Take 50 mg by mouth daily. Take with or immediately following a meal.) 180 tablet 1  . polycarbophil (FIBERCON) 625 MG tablet Take 625 mg by mouth daily as needed for diarrhea or loose stools.     . pravastatin (PRAVACHOL) 20 MG tablet TAKE ONE-HALF (1/2) TABLET TO ONE TABLET DAILY (Patient taking differently: TAKE ONE TABLET TO ONE TABLET DAILY) 90 tablet 1  . sertraline (ZOLOFT) 50 MG tablet TAKE 1 TABLET DAILY 90 tablet 1   No current facility-administered medications on file prior to visit.    Review of Systems    Review of Systems  Constitutional: Negative for fever, appetite change, and unexpected weight change. pos for fatigue and generalized weakness  Eyes: Negative for pain and visual disturbance.  Respiratory: Negative for cough and shortness of breath.   Cardiovascular: Negative for cp or palpitations    Gastrointestinal: Negative for nausea, diarrhea and constipation.  Genitourinary: Negative for urgency and frequency.  Skin: Negative for pallor or rash   Neurological: Negative for weakness, , numbness and headaches.  Hematological: Negative for adenopathy.  Does not bruise/bleed easily.  Psychiatric/Behavioral: Negative for dysphoric mood. The patient is not nervous/anxious.      Objective:   Physical Exam  Constitutional: She appears well-developed and well-nourished. No distress.  obese and well appearing   HENT:  Head: Normocephalic and atraumatic.  Mouth/Throat: Oropharynx is clear and moist.  Eyes: Conjunctivae and EOM are normal. Pupils are equal, round, and reactive to light.  Neck: Normal range of  motion. Neck supple. No JVD present. Carotid bruit is not present. No thyromegaly present.  Cardiovascular: Normal rate, regular rhythm, normal heart sounds and intact distal pulses.  Exam reveals no gallop.   Pulmonary/Chest: Effort normal and breath sounds normal. No respiratory distress. She has no wheezes. She has no rales.  No crackles  Abdominal: Soft. Bowel sounds are normal. She exhibits no distension, no abdominal bruit and no mass. There is no tenderness.  Musculoskeletal: She exhibits no edema or tenderness.  Lymphadenopathy:    She has no cervical adenopathy.  Neurological: She is alert. She has normal reflexes. No cranial nerve deficit. She exhibits normal muscle tone. Coordination normal.  Skin: Skin is warm and dry. No rash noted.  Psychiatric: She has a normal mood and affect.  Seems fatigued but in good spirits           Assessment & Plan:   Problem List Items Addressed This Visit    Edema    Much improved with addn of lasix  Rev labs Continue to monitor this and bp      Essential hypertension - Primary    Pt has felt poorly since last refill of her amlodipine from mail order (? Changed generic)- she will clarify with them and let me know  For now will hold it  Rev other med BP: (!) 150/64 mmHg    Off all med today

## 2015-04-08 NOTE — Patient Instructions (Signed)
Call your pharmacy regarding the amlodipine (let them know that the color and shape of the pill changed - ask if they changed generics or is it possible that they gave you the wrong medicine) If it is a generic change - ask if you could return to the previous generic Let them know you feel terrible and that your BP readings have been off  Then me know what they say In the meantime -continue to hold amlodipine until I make a plan Eat regular meals Drink enough fluids/ do not get dehydrated  If fever or other symptoms - let me know

## 2015-04-08 NOTE — Telephone Encounter (Signed)
Pt left v/m; pt was seen earlier today and pt spoke with pharmacy and pt has been taking amlodipine 10 mg two tabs daily. Pt request cb with what to do.

## 2015-04-08 NOTE — Telephone Encounter (Signed)
Hold for 2 more days and then go to 1 tab per day instead of 2 - I think this will help a lot

## 2015-06-26 ENCOUNTER — Telehealth: Payer: Self-pay | Admitting: Family Medicine

## 2015-06-26 DIAGNOSIS — R739 Hyperglycemia, unspecified: Secondary | ICD-10-CM

## 2015-06-26 DIAGNOSIS — I10 Essential (primary) hypertension: Secondary | ICD-10-CM

## 2015-06-26 DIAGNOSIS — N289 Disorder of kidney and ureter, unspecified: Secondary | ICD-10-CM

## 2015-06-26 DIAGNOSIS — E785 Hyperlipidemia, unspecified: Secondary | ICD-10-CM

## 2015-06-26 DIAGNOSIS — D72829 Elevated white blood cell count, unspecified: Secondary | ICD-10-CM

## 2015-06-26 NOTE — Telephone Encounter (Signed)
-----   Message from Ellamae Sia sent at 06/17/2015  3:00 PM EDT ----- Regarding: Lab orders for Thursday, 9.29.16 Patient is scheduled for CPX labs, please order future labs, Thanks , Karna Christmas

## 2015-06-27 ENCOUNTER — Other Ambulatory Visit (INDEPENDENT_AMBULATORY_CARE_PROVIDER_SITE_OTHER): Payer: Medicare Other

## 2015-06-27 DIAGNOSIS — E785 Hyperlipidemia, unspecified: Secondary | ICD-10-CM

## 2015-06-27 DIAGNOSIS — R739 Hyperglycemia, unspecified: Secondary | ICD-10-CM | POA: Diagnosis not present

## 2015-06-27 DIAGNOSIS — D72829 Elevated white blood cell count, unspecified: Secondary | ICD-10-CM

## 2015-06-27 DIAGNOSIS — I10 Essential (primary) hypertension: Secondary | ICD-10-CM | POA: Diagnosis not present

## 2015-06-27 DIAGNOSIS — N289 Disorder of kidney and ureter, unspecified: Secondary | ICD-10-CM

## 2015-06-27 LAB — COMPREHENSIVE METABOLIC PANEL
ALK PHOS: 61 U/L (ref 39–117)
ALT: 11 U/L (ref 0–35)
AST: 18 U/L (ref 0–37)
Albumin: 4.1 g/dL (ref 3.5–5.2)
BILIRUBIN TOTAL: 0.7 mg/dL (ref 0.2–1.2)
BUN: 19 mg/dL (ref 6–23)
CO2: 33 meq/L — AB (ref 19–32)
Calcium: 9.4 mg/dL (ref 8.4–10.5)
Chloride: 103 mEq/L (ref 96–112)
Creatinine, Ser: 0.96 mg/dL (ref 0.40–1.20)
GFR: 58.8 mL/min — ABNORMAL LOW (ref >60.00)
GLUCOSE: 96 mg/dL (ref 70–99)
Potassium: 4.2 mEq/L (ref 3.5–5.1)
SODIUM: 142 meq/L (ref 135–145)
TOTAL PROTEIN: 6.9 g/dL (ref 6.0–8.3)

## 2015-06-27 LAB — CBC WITH DIFFERENTIAL/PLATELET
BASOS ABS: 0 10*3/uL (ref 0.0–0.1)
Basophils Relative: 0.3 % (ref 0.0–3.0)
Eosinophils Absolute: 0.1 10*3/uL (ref 0.0–0.7)
Eosinophils Relative: 1.2 % (ref 0.0–5.0)
HCT: 44.2 % (ref 36.0–46.0)
Hemoglobin: 14.8 g/dL (ref 12.0–15.0)
LYMPHS ABS: 5 10*3/uL — AB (ref 0.7–4.0)
Lymphocytes Relative: 50.5 % — ABNORMAL HIGH (ref 12.0–46.0)
MCHC: 33.4 g/dL (ref 30.0–36.0)
MCV: 88 fl (ref 78.0–100.0)
MONO ABS: 0.6 10*3/uL (ref 0.1–1.0)
Monocytes Relative: 6.1 % (ref 3.0–12.0)
NEUTROS PCT: 41.9 % — AB (ref 43.0–77.0)
Neutro Abs: 4.2 10*3/uL (ref 1.4–7.7)
PLATELETS: 267 10*3/uL (ref 150.0–400.0)
RBC: 5.02 Mil/uL (ref 3.87–5.11)
RDW: 13.8 % (ref 11.5–15.5)
WBC: 10 10*3/uL (ref 4.0–10.5)

## 2015-06-27 LAB — LIPID PANEL
Cholesterol: 207 mg/dL — ABNORMAL HIGH (ref 0–200)
HDL: 58.3 mg/dL (ref >39.00)
LDL CALC: 119 mg/dL — AB (ref 0–99)
NONHDL: 148.69
Total CHOL/HDL Ratio: 4
Triglycerides: 147 mg/dL (ref 0.0–149.0)
VLDL: 29.4 mg/dL (ref 0.0–40.0)

## 2015-06-27 LAB — HEMOGLOBIN A1C: Hgb A1c MFr Bld: 5.6 % (ref 4.6–6.5)

## 2015-06-27 LAB — TSH: TSH: 2.43 u[IU]/mL (ref 0.35–4.50)

## 2015-07-05 ENCOUNTER — Encounter: Payer: Self-pay | Admitting: Family Medicine

## 2015-07-05 ENCOUNTER — Ambulatory Visit (INDEPENDENT_AMBULATORY_CARE_PROVIDER_SITE_OTHER): Payer: Medicare Other | Admitting: Family Medicine

## 2015-07-05 VITALS — BP 118/64 | HR 57 | Temp 97.9°F | Ht 61.5 in | Wt 168.8 lb

## 2015-07-05 DIAGNOSIS — M949 Disorder of cartilage, unspecified: Secondary | ICD-10-CM

## 2015-07-05 DIAGNOSIS — R739 Hyperglycemia, unspecified: Secondary | ICD-10-CM

## 2015-07-05 DIAGNOSIS — Z0000 Encounter for general adult medical examination without abnormal findings: Secondary | ICD-10-CM | POA: Diagnosis not present

## 2015-07-05 DIAGNOSIS — N289 Disorder of kidney and ureter, unspecified: Secondary | ICD-10-CM | POA: Diagnosis not present

## 2015-07-05 DIAGNOSIS — I10 Essential (primary) hypertension: Secondary | ICD-10-CM

## 2015-07-05 DIAGNOSIS — M899 Disorder of bone, unspecified: Secondary | ICD-10-CM

## 2015-07-05 DIAGNOSIS — E2839 Other primary ovarian failure: Secondary | ICD-10-CM | POA: Insufficient documentation

## 2015-07-05 DIAGNOSIS — E785 Hyperlipidemia, unspecified: Secondary | ICD-10-CM

## 2015-07-05 MED ORDER — AMLODIPINE BESYLATE 10 MG PO TABS: 10.0000 mg | ORAL_TABLET | Freq: Every day | ORAL | Status: DC

## 2015-07-05 MED ORDER — FUROSEMIDE 20 MG PO TABS: ORAL_TABLET | ORAL | Status: DC

## 2015-07-05 MED ORDER — SERTRALINE HCL 50 MG PO TABS: 50.0000 mg | ORAL_TABLET | Freq: Every day | ORAL | Status: DC

## 2015-07-05 MED ORDER — METOPROLOL SUCCINATE ER 50 MG PO TB24: 50.0000 mg | ORAL_TABLET | Freq: Every day | ORAL | Status: DC

## 2015-07-05 MED ORDER — PRAVASTATIN SODIUM 20 MG PO TABS: ORAL_TABLET | ORAL | Status: DC

## 2015-07-05 NOTE — Progress Notes (Signed)
Pre visit review using our clinic review tool, if applicable. No additional management support is needed unless otherwise documented below in the visit note. 

## 2015-07-05 NOTE — Patient Instructions (Signed)
Don't forget to schedule your mammogram at [facility]  We will refer you for bone density test -stop at check out for that  Look at the [place] gave you regarding advance directives (living will)

## 2015-07-05 NOTE — Progress Notes (Signed)
Subjective:    Patient ID: Linda Robinson, female    DOB: Jul 04, 1929, 81 y.o.   MRN: [id number]  HPI Here for annual medicare wellness visit as well as chronic/acute medical problems as well as annual preventative exam  Wt is stable with bmi of 31  I have personally reviewed the Medicare Annual Wellness questionnaire and have noted 1. The patient's medical and social history 2. Their use of alcohol, tobacco or illicit drugs 3. Their current medications and supplements 4. The patient's functional ability including ADL's, fall risks, home safety risks and hearing or visual             impairment. 5. Diet and physical activities 6. Evidence for depression or mood disorders  The patients weight, height, BMI have been recorded in the chart and visual acuity is per eye clinic.  I have made referrals, counseling and provided education to the patient based review of the above and I have provided the pt with a written personalized care plan for preventive services. Reviewed and updated provider list, see scanned forms.  Doing pretty good except for her back  Surgery in Feb did not help much - then had a shot  She is living with it  [facility] with a cane and tries to get by when she can   See scanned forms.  Routine anticipatory guidance given to patient.  See health maintenance. Colon cancer screening colonosc 9/00 , has had a partial colectomy for tics , and nl IFOB 5/13 No issues/problems or blood in stool , and declines further colon screening  Breast cancer screening mammogram 8/14, -she wants to get one this year  Self breast exam - no lumps or changes  Flu vaccine - will get at [facility] soon and they wil send a report  Tetanus vaccine 5/13 Pneumovax 9/15 - complete with both types  Zoster vaccine- declines but cannot afford  dexa 8/14 - osteopenia - was slt improved , never had a fracture, has had falls in the past - (one in the last year) , intol of evista  She takes her ca and D - last  D level was 50 , wants to do a 2 y f/u  Advance directive - has a POA , not a living will  Cognitive function addressed- see scanned forms- and if abnormal then additional documentation follows.  Misplaces things and forgets names -nothing more , not concerned   PMH and SH reviewed  Meds, vitals, and allergies reviewed.   ROS: See HPI.  Otherwise negative.       bp is stable today  No cp or palpitations or headaches or edema  No side effects to medicines - tolerates amlodipine now  BP Readings from Last 3 Encounters:  07/05/15 118/64  04/08/15 150/64  03/23/15 132/68     Cholesterol Lab Results  Component Value Date   CHOL 207* 06/27/2015   CHOL 243* 01/01/2015   CHOL 230* 08/16/2014   Lab Results  Component Value Date   HDL 58.30 06/27/2015   HDL 59.60 01/01/2015   HDL 60.90 08/16/2014   Lab Results  Component Value Date   LDLCALC 119* 06/27/2015   LDLCALC 155* 01/01/2015   LDLCALC 138* 08/16/2014   Lab Results  Component Value Date   TRIG 147.0 06/27/2015   TRIG 141.0 01/01/2015   TRIG 157.0* 08/16/2014   Lab Results  Component Value Date   CHOLHDL 4 06/27/2015   CHOLHDL 4 01/01/2015   CHOLHDL 4 08/16/2014  Lab Results  Component Value Date   LDLDIRECT 135.4 04/11/2013   LDLDIRECT 134.0 02/03/2012   LDLDIRECT 150.8 10/30/2010    Pravastatin -now tolerates one pill daily - has brought down the LDL    Results for orders placed or performed in visit on 06/27/15  CBC with Differential/Platelet  Result Value Ref Range   WBC 10.0 4.0 - 10.5 K/uL   RBC 5.02 3.87 - 5.11 Mil/uL   Hemoglobin 14.8 12.0 - 15.0 g/dL   HCT 44.2 36.0 - 46.0 %   MCV 88.0 78.0 - 100.0 fl   MCHC 33.4 30.0 - 36.0 g/dL   RDW 13.8 11.5 - 15.5 %   Platelets 267.0 150.0 - 400.0 K/uL   Neutrophils Relative % 41.9 (L) 43.0 - 77.0 %   Lymphocytes Relative 50.5 (H) 12.0 - 46.0 %   Monocytes Relative 6.1 3.0 - 12.0 %   Eosinophils Relative 1.2 0.0 - 5.0 %   Basophils Relative 0.3  0.0 - 3.0 %   Neutro Abs 4.2 1.4 - 7.7 K/uL   Lymphs Abs 5.0 (H) 0.7 - 4.0 K/uL   Monocytes Absolute 0.6 0.1 - 1.0 K/uL   Eosinophils Absolute 0.1 0.0 - 0.7 K/uL   Basophils Absolute 0.0 0.0 - 0.1 K/uL  Comprehensive metabolic panel  Result Value Ref Range   Sodium 142 135 - 145 mEq/L   Potassium 4.2 3.5 - 5.1 mEq/L   Chloride 103 96 - 112 mEq/L   CO2 33 (H) 19 - 32 mEq/L   Glucose, Bld 96 70 - 99 mg/dL   BUN 19 6 - 23 mg/dL   Creatinine, Ser 0.96 0.40 - 1.20 mg/dL   Total Bilirubin 0.7 0.2 - 1.2 mg/dL   Alkaline Phosphatase 61 39 - 117 U/L   AST 18 0 - 37 U/L   ALT 11 0 - 35 U/L   Total Protein 6.9 6.0 - 8.3 g/dL   Albumin 4.1 3.5 - 5.2 g/dL   Calcium 9.4 8.4 - 10.5 mg/dL   GFR 58.80 (L) >60.00 mL/min  Hemoglobin A1c  Result Value Ref Range   Hgb A1c MFr Bld 5.6 4.6 - 6.5 %  Lipid panel  Result Value Ref Range   Cholesterol 207 (H) 0 - 200 mg/dL   Triglycerides 147.0 0.0 - 149.0 mg/dL   HDL 58.30 >39.00 mg/dL   VLDL 29.4 0.0 - 40.0 mg/dL   LDL Cholesterol 119 (H) 0 - 99 mg/dL   Total CHOL/HDL Ratio 4    NonHDL 148.69   TSH  Result Value Ref Range   TSH 2.43 0.35 - 4.50 uIU/mL    Overall good labs -improving   Patient Active Problem List   Diagnosis Date Noted  . Routine general medical examination at a health care facility 07/05/2015  . Hyperglycemia 01/01/2015  . Lumbar spinal stenosis 11/13/2014  . Vertigo 10/05/2014  . Elevated transaminase level 07/16/2014  . Leukocytosis 07/16/2014  . Risk for falls 06/27/2014  . Fall (on)(from) incline, initial encounter 09/15/2013  . Low back pain on right side with sciatica 07/24/2013  . Edema 04/26/2013  . Encounter for Medicare annual wellness exam 04/11/2013  . Colon cancer screening 02/10/2012  . BRADYCARDIA 05/19/2010  . Disorder of bone and cartilage 01/30/2009  . CONSTIPATION 11/01/2007  . ANXIETY STATE NEC 02/14/2007  . REACTION, ACUTE STRESS W/EMOTIONAL DSTURB 02/14/2007  . Depression with anxiety  02/14/2007  . Hyperlipidemia LDL goal <130 02/04/2007  . Gout 02/04/2007  . Essential hypertension 02/04/2007  . DIVERTICULOSIS,  COLON 02/04/2007  . Renal insufficiency 02/04/2007  . OSTEOARTHRITIS 02/04/2007  . [place] DISEASE 02/04/2007  . LEG CRAMPS 02/04/2007   Past Medical History  Diagnosis Date  . History of diverticulitis of colon   . Diverticulosis of colon   . Gout     hx of;was on Allopurinol but taken off 6 wks ago by medical MD  . HLD (hyperlipidemia)     takes Pravastatin daily  . Osteopenia     takes Calcium and Vit D daily  . GERD (gastroesophageal reflux disease)     atypical chest pain  . Basal cell carcinoma     removed  . HTN (hypertension)     takes Amlodipine and Metoprolol daily  . Vertigo     takes Antivert daily as needed  . Constipation     takes Colace daily  . Depression     takes Zoloft daily  . OA (osteoarthritis)     right knee  . Chronic back pain     stenosis   Past Surgical History  Procedure Laterality Date  . Cataract extraction Right 04/2006  . Cholecystectomy    . Partial colectomy  09/1999    d/t diverticulitis  . Neck surgery      x 2;fusion  . Knee surgery Right     Medial meniscus tear  . Back surgery    . Tubal ligation    . Appendectomy  at age 89  . Colonoscopy    . Lumbar laminectomy/decompression microdiscectomy Bilateral 11/13/2014    Procedure: Lumbar one-two Bilateral laminectomy and foraminotomy, Left Lumbar one-two microdiscectomy ;  Surgeon: Faythe Ghee, MD;  Location: [facility];  Service: Neurosurgery;  Laterality: Bilateral;   Social History  Substance Use Topics  . Smoking status: Never Smoker   . Smokeless tobacco: None  . Alcohol Use: No   Family History  Problem Relation Age of Onset  . Cancer Father     throat and bladder  . Diabetes Father   . Hypertension Father   . Heart attack Father   . Stroke Mother   . Alzheimer's disease Mother   . Hypertension Mother    Allergies    Allergen Reactions  . Ace Inhibitors     REACTION: increased K+, increased creat.  . Cephalosporins     REACTION: reaction not known  . Colesevelam     REACTION: constipation  . Lovastatin     REACTION: doesn't work  . Raloxifene     REACTION: cramps  . Rosuvastatin     REACTION: myalgia  . Simvastatin     REACTION: myalgia   Current Outpatient Prescriptions on File Prior to Visit  Medication Sig Dispense Refill  . amLODipine (NORVASC) 10 MG tablet Take 1 tablet (10 mg total) by mouth daily. 90 tablet 3  . Calcium Carbonate-Vitamin D (CALCIUM + D PO) Take 1 tablet by mouth daily.    . Cranberry 250 MG CAPS Take 1 capsule by mouth daily.      Marland Kitchen docusate sodium (COLACE) 100 MG capsule Take 200 mg by mouth daily.      . fish oil-omega-3 fatty acids 1000 MG capsule Take 1 g by mouth 2 (two) times daily.     . furosemide (LASIX) 20 MG tablet Take 1 pill by mouth once daily as needed for swelling 90 tablet 3  . gabapentin (NEURONTIN) 300 MG capsule Take 1 capsule by mouth 3 (three) times daily.    . meclizine (ANTIVERT) 25 MG  tablet Take 25 mg by mouth 4 (four) times daily as needed for dizziness.     . metoprolol succinate (TOPROL-XL) 50 MG 24 hr tablet Take 1 tablet (50 mg total) by mouth 2 (two) times daily. Take with or immediately following a meal. (Patient taking differently: Take 50 mg by mouth daily. Take with or immediately following a meal.) 180 tablet 1  . polycarbophil (FIBERCON) 625 MG tablet Take 625 mg by mouth daily as needed for diarrhea or loose stools.     . pravastatin (PRAVACHOL) 20 MG tablet TAKE ONE-HALF (1/2) TABLET TO ONE TABLET DAILY (Patient taking differently: TAKE ONE TABLET TO ONE TABLET DAILY) 90 tablet 1  . sertraline (ZOLOFT) 50 MG tablet TAKE 1 TABLET DAILY 90 tablet 1   No current facility-administered medications on file prior to visit.      Review of Systems Review of Systems  Constitutional: Negative for fever, appetite change, fatigue and  unexpected weight change.  Eyes: Negative for pain and visual disturbance.  Respiratory: Negative for cough and shortness of breath.   Cardiovascular: Negative for cp or palpitations    Gastrointestinal: Negative for nausea, diarrhea and constipation.  Genitourinary: Negative for urgency and frequency.  Skin: Negative for pallor or rash   MSK pos for chronic back pain causing mobility problems  Neurological: Negative for weakness, light-headedness, numbness and headaches.  Hematological: Negative for adenopathy. Does not bruise/bleed easily.  Psychiatric/Behavioral: Negative for dysphoric mood. The patient is not nervous/anxious.         Objective:   Physical Exam  Constitutional: She appears well-developed and well-nourished. No distress.  obese and well appearing   HENT:  Head: Normocephalic and atraumatic.  Right Ear: External ear normal.  Left Ear: External ear normal.  Mouth/Throat: Oropharynx is clear and moist.  Eyes: Conjunctivae and EOM are normal. Pupils are equal, round, and reactive to light. No scleral icterus.  Neck: Normal range of motion. Neck supple. No JVD present. Carotid bruit is not present. No thyromegaly present.  Cardiovascular: Regular rhythm, normal heart sounds and intact distal pulses.  Exam reveals no gallop.   Rate in 50s  Pulmonary/Chest: Effort normal and breath sounds normal. No respiratory distress. She has no wheezes. She exhibits no tenderness.  Abdominal: Soft. Bowel sounds are normal. She exhibits no distension, no abdominal bruit and no mass. There is no tenderness.  Genitourinary: No breast swelling, tenderness, discharge or bleeding.  Breast exam: No mass, nodules, thickening, tenderness, bulging, retraction, inflamation, nipple discharge or skin changes noted.  No axillary or clavicular LA.      Musculoskeletal: She exhibits no edema or tenderness.  Poor rom spine /mobility impaired with cane   Lymphadenopathy:    She has no cervical  adenopathy.  Neurological: She is alert. She has normal reflexes. No cranial nerve deficit. She exhibits normal muscle tone. Coordination normal.  Skin: Skin is warm and dry. No rash noted. No erythema. No pallor.  Psychiatric: She has a normal mood and affect.          Assessment & Plan:   Problem List Items Addressed This Visit      Cardiovascular and Mediastinum   Essential hypertension    bp in fair control at this time  BP Readings from Last 1 Encounters:  07/05/15 118/64   No changes needed Disc lifstyle change with low sodium diet and exercise  Labs reviewed       Relevant Medications   pravastatin (PRAVACHOL) 20 MG tablet   metoprolol  succinate (TOPROL-XL) 50 MG 24 hr tablet   furosemide (LASIX) 20 MG tablet   amLODipine (NORVASC) 10 MG tablet     Musculoskeletal and Integument   Disorder of bone and cartilage    Osteopenia  Intol of evista  Will plan 2 y f/u dexa  No fractures  Disc need for calcium/ vitamin D/ wt bearing exercise and bone density test every 2 y to monitor Disc safety/ fracture risk in detail          Genitourinary   Renal insufficiency    Lab Results  Component Value Date   CREATININE 0.96 06/27/2015   BUN 19 06/27/2015   NA 142 06/27/2015   K 4.2 06/27/2015   CL 103 06/27/2015   CO2 33* 06/27/2015   Overall improved and stable Enc to keep up her water intake         Other   Encounter for Medicare annual wellness exam - Primary    Reviewed health habits including diet and exercise and skin cancer prevention Reviewed appropriate screening tests for age  Also reviewed health mt list, fam hx and immunization status , as well as social and family history   See HPI Labs reviewed  Don't forget to schedule your mammogram at [facility]  We will refer you for bone density test -stop at check out for that  Look at the [facility] I gave you regarding advance directives (living will)       Estrogen deficiency    Ref for 2 y dexa        Relevant Orders   DG Bone Density   Hyperglycemia    Lab Results  Component Value Date   HGBA1C 5.6 06/27/2015   This is stable and well controlled       Hyperlipidemia LDL goal <130    Able to tolerate a whole pravachol pill now -improvement noted in the numbers  Disc goals for lipids and reasons to control them Rev labs with pt Rev low sat fat diet in detail       Relevant Medications   pravastatin (PRAVACHOL) 20 MG tablet   metoprolol succinate (TOPROL-XL) 50 MG 24 hr tablet   furosemide (LASIX) 20 MG tablet   amLODipine (NORVASC) 10 MG tablet   Routine general medical examination at a health care facility    Reviewed health habits including diet and exercise and skin cancer prevention Reviewed appropriate screening tests for age  Also reviewed health mt list, fam hx and immunization status , as well as social and family history   See HPI Labs reviewed  Don't forget to schedule your mammogram at [facility]  We will refer you for bone density test -stop at check out for that  Look at the [facility] I gave you regarding advance directives (living will)

## 2015-07-07 NOTE — Assessment & Plan Note (Signed)
Ref for 2 y dexa  

## 2015-07-07 NOTE — Assessment & Plan Note (Signed)
Lab Results  Component Value Date   HGBA1C 5.6 06/27/2015   This is stable and well controlled

## 2015-07-07 NOTE — Assessment & Plan Note (Signed)
Osteopenia  Intol of evista  Will plan 2 y f/u dexa  No fractures  Disc need for calcium/ vitamin D/ wt bearing exercise and bone density test every 2 y to monitor Disc safety/ fracture risk in detail

## 2015-07-07 NOTE — Assessment & Plan Note (Signed)
Reviewed health habits including diet and exercise and skin cancer prevention Reviewed appropriate screening tests for age  Also reviewed health mt list, fam hx and immunization status , as well as social and family history   See HPI Labs reviewed  Don't forget to schedule your mammogram at [facility]  We will refer you for bone density test -stop at check out for that  Look at the [place] gave you regarding advance directives (living will)

## 2015-07-07 NOTE — Assessment & Plan Note (Signed)
Reviewed health habits including diet and exercise and skin cancer prevention Reviewed appropriate screening tests for age  Also reviewed health mt list, fam hx and immunization status , as well as social and family history   See HPI Labs reviewed  Don't forget to schedule your mammogram at [facility]  We will refer you for bone density test -stop at check out for that  Look at the [place] gave you regarding advance directives (living will)

## 2015-07-07 NOTE — Assessment & Plan Note (Signed)
Lab Results  Component Value Date   CREATININE 0.96 06/27/2015   BUN 19 06/27/2015   NA 142 06/27/2015   K 4.2 06/27/2015   CL 103 06/27/2015   CO2 33* 06/27/2015   Overall improved and stable Enc to keep up her water intake

## 2015-07-07 NOTE — Assessment & Plan Note (Signed)
Able to tolerate a whole pravachol pill now -improvement noted in the numbers  Disc goals for lipids and reasons to control them Rev labs with pt Rev low sat fat diet in detail

## 2015-07-07 NOTE — Assessment & Plan Note (Signed)
bp in fair control at this time  BP Readings from Last 1 Encounters:  07/05/15 118/64   No changes needed Disc lifstyle change with low sodium diet and exercise  Labs reviewed

## 2015-07-30 ENCOUNTER — Encounter: Payer: Self-pay | Admitting: Family Medicine

## 2015-08-09 ENCOUNTER — Ambulatory Visit (INDEPENDENT_AMBULATORY_CARE_PROVIDER_SITE_OTHER): Payer: Medicare Other | Admitting: Internal Medicine

## 2015-08-09 ENCOUNTER — Encounter: Payer: Self-pay | Admitting: Internal Medicine

## 2015-08-09 VITALS — BP 140/70 | HR 57 | Temp 98.0°F | Wt 165.0 lb

## 2015-08-09 DIAGNOSIS — T50B95A Adverse effect of other viral vaccines, initial encounter: Secondary | ICD-10-CM | POA: Diagnosis not present

## 2015-08-09 NOTE — Progress Notes (Signed)
Pre visit review using our clinic review tool, if applicable. No additional management support is needed unless otherwise documented below in the visit note. 

## 2015-08-09 NOTE — Patient Instructions (Signed)
I would recommend acetaminophen (tylenol) 500 or 650mg  every 4-6 hours for your symptoms.

## 2015-08-09 NOTE — Progress Notes (Signed)
Subjective:    Patient ID: Linda Robinson, female    DOB: 06/04/1929, 81 y.o.   MRN: [id number]  HPI Here due to concerns about her BP Took it yesterday at home 175/70 or so  Feels shaky and weak Having headaches--across forehead 2-3 days Wonders if last week's flu shot is the cause--had the high dose  No fever No chills but getting hot Only rare cough-- first thing in the AM. No SOB  No overt pain but feels funny in chest Tired feeling all over--not clearly joints though Slight nausea but no vomiting  Current Outpatient Prescriptions on File Prior to Visit  Medication Sig Dispense Refill  . amLODipine (NORVASC) 10 MG tablet Take 1 tablet (10 mg total) by mouth daily. 90 tablet 3  . Calcium Carbonate-Vitamin D (CALCIUM + D PO) Take 1 tablet by mouth daily.    . Cranberry 250 MG CAPS Take 1 capsule by mouth daily.      Marland Kitchen docusate sodium (COLACE) 100 MG capsule Take 200 mg by mouth daily.      . fish oil-omega-3 fatty acids 1000 MG capsule Take 1 g by mouth 2 (two) times daily.     . furosemide (LASIX) 20 MG tablet Take 1 pill by mouth once daily as needed for swelling 90 tablet 3  . gabapentin (NEURONTIN) 300 MG capsule Take 1 capsule by mouth 3 (three) times daily.    . meclizine (ANTIVERT) 25 MG tablet Take 25 mg by mouth 4 (four) times daily as needed for dizziness.     . metoprolol succinate (TOPROL-XL) 50 MG 24 hr tablet Take 1 tablet (50 mg total) by mouth daily. Take with or immediately following a meal. 180 tablet 1  . polycarbophil (FIBERCON) 625 MG tablet Take 625 mg by mouth daily as needed for diarrhea or loose stools.     . pravastatin (PRAVACHOL) 20 MG tablet TAKE ONE TABLET TO ONE TABLET DAILY 90 tablet 1  . sertraline (ZOLOFT) 50 MG tablet Take 1 tablet (50 mg total) by mouth daily. 90 tablet 1   No current facility-administered medications on file prior to visit.    Allergies  Allergen Reactions  . Ace Inhibitors     REACTION: increased K+, increased  creat.  . Cephalosporins     REACTION: reaction not known  . Colesevelam     REACTION: constipation  . Lovastatin     REACTION: doesn't work  . Raloxifene     REACTION: cramps  . Rosuvastatin     REACTION: myalgia  . Simvastatin     REACTION: myalgia    Past Medical History  Diagnosis Date  . History of diverticulitis of colon   . Diverticulosis of colon   . Gout     hx of;was on Allopurinol but taken off 6 wks ago by medical MD  . HLD (hyperlipidemia)     takes Pravastatin daily  . Osteopenia     takes Calcium and Vit D daily  . GERD (gastroesophageal reflux disease)     atypical chest pain  . Basal cell carcinoma     removed  . HTN (hypertension)     takes Amlodipine and Metoprolol daily  . Vertigo     takes Antivert daily as needed  . Constipation     takes Colace daily  . Depression     takes Zoloft daily  . OA (osteoarthritis)     right knee  . Chronic back pain     stenosis  Past Surgical History  Procedure Laterality Date  . Cataract extraction Right 04/2006  . Cholecystectomy    . Partial colectomy  09/1999    d/t diverticulitis  . Neck surgery      x 2;fusion  . Knee surgery Right     Medial meniscus tear  . Back surgery    . Tubal ligation    . Appendectomy  at age 81  . Colonoscopy    . Lumbar laminectomy/decompression microdiscectomy Bilateral 11/13/2014    Procedure: Lumbar one-two Bilateral laminectomy and foraminotomy, Left Lumbar one-two microdiscectomy ;  Surgeon: Faythe Ghee, MD;  Location: [facility];  Service: Neurosurgery;  Laterality: Bilateral;    Family History  Problem Relation Age of Onset  . Cancer Father     throat and bladder  . Diabetes Father   . Hypertension Father   . Heart attack Father   . Stroke Mother   . Alzheimer's disease Mother   . Hypertension Mother     Social History   Social History  . Marital Status: Married    Spouse Name: N/A  . Number of Children: N/A  . Years of Education: N/A    Occupational History  . Retired    Social History Main Topics  . Smoking status: Never Smoker   . Smokeless tobacco: Not on file  . Alcohol Use: No  . Drug Use: No  . Sexual Activity: Not Currently    Birth Control/ Protection: Post-menopausal   Other Topics Concern  . Not on file   Social History Narrative   Review of Systems Making herself eat Sleeping okay No rash No dysuria or hematuria Bowels are the same--takes softener for chronic constipation    Objective:   Physical Exam  Constitutional: She appears well-developed and well-nourished.  HENT:  Mouth/Throat: Oropharynx is clear and moist. No oropharyngeal exudate.  Neck: Normal range of motion. Neck supple.  Cardiovascular: Normal rate, regular rhythm and normal heart sounds.  Exam reveals no gallop.   No murmur heard. Pulmonary/Chest: Effort normal and breath sounds normal. No respiratory distress. She has no wheezes. She has no rales.  Abdominal: Soft. There is no tenderness.  Musculoskeletal: She exhibits no edema.  Lymphadenopathy:    She has no cervical adenopathy.  Skin: No rash noted.          Assessment & Plan:

## 2015-08-09 NOTE — Assessment & Plan Note (Signed)
Vague symptoms of fatigue, headache, aching, etc (like minor flu) High dose vaccine last week No worrisome features BP fine here---repeat 146/78 on right (after up on table)  Reassured acetaminophen

## 2015-09-19 ENCOUNTER — Other Ambulatory Visit: Payer: Self-pay

## 2015-09-19 MED ORDER — HYDROCODONE-ACETAMINOPHEN 5-325 MG PO TABS: 1.0000 | ORAL_TABLET | ORAL | Status: DC | PRN

## 2015-09-19 NOTE — Telephone Encounter (Signed)
Rx faxed to [facility] @ [phone], phone number [phone]  

## 2015-09-24 ENCOUNTER — Non-Acute Institutional Stay (SKILLED_NURSING_FACILITY): Payer: Medicare Other | Admitting: Internal Medicine

## 2015-09-24 DIAGNOSIS — K5900 Constipation, unspecified: Secondary | ICD-10-CM | POA: Diagnosis not present

## 2015-09-24 DIAGNOSIS — I10 Essential (primary) hypertension: Secondary | ICD-10-CM | POA: Diagnosis not present

## 2015-09-24 DIAGNOSIS — F329 Major depressive disorder, single episode, unspecified: Secondary | ICD-10-CM

## 2015-09-24 DIAGNOSIS — M792 Neuralgia and neuritis, unspecified: Secondary | ICD-10-CM

## 2015-09-24 DIAGNOSIS — M4806 Spinal stenosis, lumbar region: Secondary | ICD-10-CM

## 2015-09-24 DIAGNOSIS — M48061 Spinal stenosis, lumbar region without neurogenic claudication: Secondary | ICD-10-CM

## 2015-09-24 DIAGNOSIS — H8110 Benign paroxysmal vertigo, unspecified ear: Secondary | ICD-10-CM

## 2015-09-24 DIAGNOSIS — R531 Weakness: Secondary | ICD-10-CM | POA: Diagnosis not present

## 2015-09-24 NOTE — Progress Notes (Signed)
Patient ID: Linda Robinson, female   DOB: [birth year], 81 y.o.   MRN: [id number]     Facility: [facility]    PCP: Loura Pardon, MD  Code Status: full code  Allergies  Allergen Reactions  . Ace Inhibitors     REACTION: increased K+, increased creat.  . Cephalosporins     REACTION: reaction not known  . Colesevelam     REACTION: constipation  . Lovastatin     REACTION: doesn't work  . Raloxifene     REACTION: cramps  . Rosuvastatin     REACTION: myalgia  . Simvastatin     REACTION: myalgia    Chief Complaint  Patient presents with  . New Admit To SNF     HPI:  81 y.o. patient is here for short term rehabilitation post hospital admission from 09/17/15-09/19/15 with lumbar revision and L1-3 fusion. She is seen in her room today with her husband and son present. Her pain is under control. Denies any pain at present. Has some pain with movement. She had a bowel movement today. Denies any concern. As per son, she was confused and hallucinating yesterday. U/a has been sent this am. At present she is alert and oriented.   Review of Systems:  Constitutional: Negative for fever, chills, diaphoresis.  HENT: Negative for headache, congestion, nasal discharge, difficulty swallowing.   Eyes: Negative for eye pain, blurred vision, double vision and discharge.  Respiratory: Negative for cough, shortness of breath and wheezing.   Cardiovascular: Negative for chest pain, palpitations, leg swelling.  Gastrointestinal: Negative for heartburn, nausea, vomiting, abdominal pain. Genitourinary: Negative for dysuria, flank pain.  Musculoskeletal: Negative for falls  Skin: Negative for itching, rash.  Neurological: Negative for dizziness Psychiatric/Behavioral: Negative for depression   Past Medical History  Diagnosis Date  . History of diverticulitis of colon   . Diverticulosis of colon   . Gout     hx of;was on Allopurinol but taken off 6 wks ago by medical MD   . HLD (hyperlipidemia)     takes Pravastatin daily  . Osteopenia     takes Calcium and Vit D daily  . GERD (gastroesophageal reflux disease)     atypical chest pain  . Basal cell carcinoma     removed  . HTN (hypertension)     takes Amlodipine and Metoprolol daily  . Vertigo     takes Antivert daily as needed  . Constipation     takes Colace daily  . Depression     takes Zoloft daily  . OA (osteoarthritis)     right knee  . Chronic back pain     stenosis   Past Surgical History  Procedure Laterality Date  . Cataract extraction Right 04/2006  . Cholecystectomy    . Partial colectomy  09/1999    d/t diverticulitis  . Neck surgery      x 2;fusion  . Knee surgery Right     Medial meniscus tear  . Back surgery    . Tubal ligation    . Appendectomy  at age 40  . Colonoscopy    . Lumbar laminectomy/decompression microdiscectomy Bilateral 11/13/2014    Procedure: Lumbar one-two Bilateral laminectomy and foraminotomy, Left Lumbar one-two microdiscectomy ;  Surgeon: Faythe Ghee, MD;  Location: [facility];  Service: Neurosurgery;  Laterality: Bilateral;   Social History:   reports that she has never smoked. She does not have any smokeless tobacco history on file. She reports that  she does not drink alcohol or use illicit drugs.  Family History  Problem Relation Age of Onset  . Cancer Father     throat and bladder  . Diabetes Father   . Hypertension Father   . Heart attack Father   . Stroke Mother   . Alzheimer's disease Mother   . Hypertension Mother     Medications: Medication reviewed. See MAR   Physical Exam: Filed Vitals:   09/24/15 1605  BP: 148/75  Pulse: 62  Temp: 97.5 F (36.4 C)  Resp: 18  SpO2: 93%    General- elderly female, well built, in no acute distress Head- normocephalic, atraumatic Nose- normal nasal mucosa, no maxillary or frontal sinus tenderness, no nasal discharge Throat- moist mucus membrane Eyes- PERRLA, EOMI, no pallor, no  icterus, no discharge, normal conjunctiva, normal sclera Neck- no cervical lymphadenopathy Cardiovascular- normal s1,s2, no murmurs, palpable dorsalis pedis and radial pulses, no leg edema Respiratory- bilateral clear to auscultation, no wheeze, no rhonchi, no crackles, no use of accessory muscles Abdomen- bowel sounds present, soft, non tender Musculoskeletal- able to move all 4 extremities, generalized weakness > in lower extremity, has back brace Neurological- no focal deficit, alert and oriented to person, place and time Skin- warm and dry Psychiatry- normal mood and affect    Labs reviewed: Basic Metabolic Panel:  Recent Labs  01/01/15 0921 02/08/15 0842 06/27/15 0902  NA 140 138 142  K 4.4 4.2 4.2  CL 103 101 103  CO2 31 31 33*  GLUCOSE 90 93 96  BUN 21 19 19   CREATININE 1.00 1.02 0.96  CALCIUM 9.9 9.8 9.4   Liver Function Tests:  Recent Labs  01/01/15 0921 06/27/15 0902  AST 19 18  ALT 10 11  ALKPHOS 67 61  BILITOT 0.6 0.7  PROT 7.2 6.9  ALBUMIN 4.2 4.1   No results for input(s): LIPASE, AMYLASE in the last 8760 hours. No results for input(s): AMMONIA in the last 8760 hours. CBC:  Recent Labs  11/15/14 2022 01/01/15 0921 06/27/15 0902  WBC 12.7* 11.5* 10.0  NEUTROABS  --  4.3 4.2  HGB 12.1 14.6 14.8  HCT 37.2 43.1 44.2  MCV 87.3 84.0 88.0  PLT 238 271.0 267.0   Cardiac Enzymes: No results for input(s): CKTOTAL, CKMB, CKMBINDEX, TROPONINI in the last 8760 hours. BNP: Invalid input(s): POCBNP CBG:  Recent Labs  11/15/14 2010  GLUCAP 116*    Radiological Exams: Dg Lumbar Spine Complete  11/15/2014  CLINICAL DATA:  Pt states that she fell today and is having lower back pains, states that she had lumbar surgery yesterday and fell today EXAM: LUMBAR SPINE - COMPLETE 4+ VIEW COMPARISON:  07/28/2013 FINDINGS: No fracture.  Mild dextroscoliosis with the apex at L 2. Mild retrolisthesis, grade 1, of L2 on L3. No other spondylolisthesis. Moderate  loss disc height at L1-L2 and L2-L3 with mild loss disc height at L3-L4. L4-L5 and L5-S1 discs are relatively well preserved There is facet degenerative change at L4-L5 and L5-S1. Bones are demineralized. Soft tissues are unremarkable. IMPRESSION: 1. No fracture or acute finding. 2. Degenerative changes as described without significant change from the prior radiographs. Electronically Signed   By: Lajean Manes M.D.   On: 11/15/2014 21:20   Ct Head Wo Contrast  11/15/2014  CLINICAL DATA:  Dizziness and fall.  Blow to the bridge of the nose. EXAM: CT HEAD WITHOUT CONTRAST TECHNIQUE: Contiguous axial images were obtained from the base of the skull through the vertex without  intravenous contrast. COMPARISON:  Head CT scan 05/2003/ 2010. FINDINGS: The brain appears normal without hemorrhage, infarct, mass lesion, mass effect, midline shift or abnormal extra-axial fluid collection. Brain volume appears appropriate for age. No fracture is identified. Imaged paranasal sinuses and mastoid air cells are clear. IMPRESSION: Negative head CT scan. Electronically Signed   By: Inge Rise M.D.   On: 11/15/2014 21:41     Assessment/Plan  Generalized weakness Will have her work with physical therapy and occupational therapy team to help with gait training and muscle strengthening exercises.fall precautions. Skin care. Encourage to be out of bed.   Lumbar spinal stenosis S/p lumbar revision with L1-3 fusion. Pain under control. With her change of mental status and confusion yesterday, change her norco to 5-325 mg q6h prn for pain. Also change her baclofen to 10 mg bid for now and reassess. Will have patient work with PT/OT as tolerated to regain strength and restore function.  Fall precautions are in place. Has f/u with neurosurgery. Continue to wear back brace and take back precautions  HTN Monitor BP, continue norvasc 10 mg daily, lasix 20 mg daily, toprol xl 50 mg daily and monitor BP  Constipation Stable,  continue colace 100 mg daily in am and 200 mg pm  Chronic depression Stable, continue zoloft 50 mg daily  Neuropathic pain Continue gabapentin 300 mg tid  Vertigo Stable, continue meclizine 25 mg tid prn    Goals of care: short term rehabilitation   Labs/tests ordered: cbc and cmp 09/25/15  Family/ staff Communication: reviewed care plan with patient and nursing supervisor    Blanchie Serve, MD  [facility] [phone] (Monday-Friday 8 am - 5 pm) [phone] (afterhours)

## 2015-09-26 LAB — CBC AND DIFFERENTIAL: WBC: 12.1 10^3/mL

## 2015-10-01 LAB — CBC AND DIFFERENTIAL
HCT: 37 % (ref 36–46)
Hemoglobin: 11.2 g/dL — AB (ref 12.0–16.0)
Platelets: 539 10*3/uL — AB (ref 150–399)

## 2015-10-07 ENCOUNTER — Non-Acute Institutional Stay (SKILLED_NURSING_FACILITY): Payer: Medicare Other | Admitting: Nurse Practitioner

## 2015-10-07 ENCOUNTER — Encounter: Payer: Self-pay | Admitting: Nurse Practitioner

## 2015-10-07 DIAGNOSIS — M792 Neuralgia and neuritis, unspecified: Secondary | ICD-10-CM | POA: Diagnosis not present

## 2015-10-07 DIAGNOSIS — N390 Urinary tract infection, site not specified: Secondary | ICD-10-CM | POA: Diagnosis not present

## 2015-10-07 DIAGNOSIS — K5900 Constipation, unspecified: Secondary | ICD-10-CM

## 2015-10-07 DIAGNOSIS — M4806 Spinal stenosis, lumbar region: Secondary | ICD-10-CM

## 2015-10-07 DIAGNOSIS — I10 Essential (primary) hypertension: Secondary | ICD-10-CM

## 2015-10-07 DIAGNOSIS — M48061 Spinal stenosis, lumbar region without neurogenic claudication: Secondary | ICD-10-CM

## 2015-10-07 NOTE — Progress Notes (Signed)
Nursing Home Location:  [place] of Service: SNF (31)  PCP: Linda Pardon, MD  Allergies  Allergen Reactions  . Ace Inhibitors     REACTION: increased K+, increased creat.  . Cephalosporins     REACTION: reaction not known  . Colesevelam     REACTION: constipation  . Lovastatin     REACTION: doesn't work  . Raloxifene     REACTION: cramps  . Rosuvastatin     REACTION: myalgia  . Simvastatin     REACTION: myalgia    Chief Complaint  Patient presents with  . Discharge Note    HPI:  Patient is a 82 y.o. female seen today at [facility] for discharge home. Pt at [facility] for short term rehabilitation after hospitalization 09/17/15-09/19/15 with lumbar revision and L1-3 fusion. Her pain is under control. Denies any pain at present.  Review of Systems:  Review of Systems  Constitutional: Negative for activity change, appetite change, fatigue and unexpected weight change.  HENT: Negative for congestion and hearing loss.   Eyes: Negative.   Respiratory: Negative for cough and shortness of breath.   Cardiovascular: Negative for chest pain, palpitations and leg swelling.  Gastrointestinal: Negative for abdominal pain, diarrhea and constipation.  Genitourinary: Negative for dysuria and difficulty urinating.  Musculoskeletal: Negative for myalgias and arthralgias.  Skin: Negative for color change and wound.  Neurological: Negative for dizziness and weakness.  Psychiatric/Behavioral: Negative for behavioral problems, confusion and agitation.    Past Medical History  Diagnosis Date  . History of diverticulitis of colon   . Diverticulosis of colon   . Gout     hx of;was on Allopurinol but taken off 6 wks ago by medical MD  . HLD (hyperlipidemia)     takes Pravastatin daily  . Osteopenia     takes Calcium and Vit D daily  . GERD (gastroesophageal reflux disease)     atypical chest pain  . Basal cell carcinoma    removed  . HTN (hypertension)     takes Amlodipine and Metoprolol daily  . Vertigo     takes Antivert daily as needed  . Constipation     takes Colace daily  . Depression     takes Zoloft daily  . OA (osteoarthritis)     right knee  . Chronic back pain     stenosis   Past Surgical History  Procedure Laterality Date  . Cataract extraction Right 04/2006  . Cholecystectomy    . Partial colectomy  09/1999    d/t diverticulitis  . Neck surgery      x 2;fusion  . Knee surgery Right     Medial meniscus tear  . Back surgery    . Tubal ligation    . Appendectomy  at age 69  . Colonoscopy    . Lumbar laminectomy/decompression microdiscectomy Bilateral 11/13/2014    Procedure: Lumbar one-two Bilateral laminectomy and foraminotomy, Left Lumbar one-two microdiscectomy ;  Surgeon: Linda Ghee, MD;  Location: [facility];  Service: Neurosurgery;  Laterality: Bilateral;   Social History:   reports that she has never smoked. She does not have any smokeless tobacco history on file. She reports that she does not drink alcohol or use illicit drugs.  Family History  Problem Relation Age of Onset  . Cancer Father     throat and bladder  . Diabetes Father   . Hypertension Father   . Heart  attack Father   . Stroke Mother   . Alzheimer's disease Mother   . Hypertension Mother     Medications: Patient's Medications  New Prescriptions   No medications on file  Previous Medications   AMLODIPINE (NORVASC) 10 MG TABLET    Take 1 tablet (10 mg total) by mouth daily.   CALCIUM CARBONATE-VITAMIN D (CALCIUM + D PO)    Take 1 tablet by mouth daily.   CRANBERRY 250 MG CAPS    Take 1 capsule by mouth daily.     DOCUSATE SODIUM (COLACE) 100 MG CAPSULE    Take 200 mg by mouth daily.     FISH OIL-OMEGA-3 FATTY ACIDS 1000 MG CAPSULE    Take 1 g by mouth 2 (two) times daily.    FUROSEMIDE (LASIX) 20 MG TABLET    Take 1 pill by mouth once daily as needed for swelling   GABAPENTIN (NEURONTIN) 300 MG  CAPSULE    Take 1 capsule by mouth 3 (three) times daily.   HYDROCODONE-ACETAMINOPHEN (NORCO/VICODIN) 5-325 MG TABLET    Take 1 tablet by mouth every 4 (four) hours as needed for moderate pain. DO NOT EXCEED 4GM OF APAP IN 24 HOURS FROM ALL SOURCES   MECLIZINE (ANTIVERT) 25 MG TABLET    Take 25 mg by mouth 4 (four) times daily as needed for dizziness.    METOPROLOL SUCCINATE (TOPROL-XL) 50 MG 24 HR TABLET    Take 1 tablet (50 mg total) by mouth daily. Take with or immediately following a meal.   POLYCARBOPHIL (FIBERCON) 625 MG TABLET    Take 625 mg by mouth daily as needed for diarrhea or loose stools.    PRAVASTATIN (PRAVACHOL) 20 MG TABLET    TAKE ONE TABLET TO ONE TABLET DAILY   SERTRALINE (ZOLOFT) 50 MG TABLET    Take 1 tablet (50 mg total) by mouth daily.  Modified Medications   No medications on file  Discontinued Medications   No medications on file     Physical Exam: Filed Vitals:   10/07/15 1330  BP: 132/58  Pulse: 73  Temp: 98.3 F (36.8 C)  Resp: 20  SpO2: 96%    Physical Exam  Constitutional: She is oriented to person, place, and time. She appears well-developed and well-nourished. No distress.  HENT:  Head: Normocephalic and atraumatic.  Mouth/Throat: Oropharynx is clear and moist. No oropharyngeal exudate.  Eyes: Conjunctivae are normal. Pupils are equal, round, and reactive to light.  Neck: Normal range of motion. Neck supple.  Cardiovascular: Normal rate, regular rhythm and normal heart sounds.   Pulmonary/Chest: Effort normal and breath sounds normal.  Abdominal: Soft. Bowel sounds are normal.  Musculoskeletal: She exhibits no edema.  Back brace in place  Neurological: She is alert and oriented to person, place, and time.  Skin: Skin is warm and dry. She is not diaphoretic.  Psychiatric: She has a normal mood and affect.    Labs reviewed: Basic Metabolic Panel:  Recent Labs  01/01/15 0921 02/08/15 0842 06/27/15 0902  NA 140 138 142  K 4.4 4.2 4.2    CL 103 101 103  CO2 31 31 33*  GLUCOSE 90 93 96  BUN 21 19 19   CREATININE 1.00 1.02 0.96  CALCIUM 9.9 9.8 9.4   Liver Function Tests:  Recent Labs  01/01/15 0921 06/27/15 0902  AST 19 18  ALT 10 11  ALKPHOS 67 61  BILITOT 0.6 0.7  PROT 7.2 6.9  ALBUMIN 4.2 4.1   No results  for input(s): LIPASE, AMYLASE in the last 8760 hours. No results for input(s): AMMONIA in the last 8760 hours. CBC:  Recent Labs  11/15/14 2022 01/01/15 0921 06/27/15 0902 09/26/15 10/01/15  WBC 12.7* 11.5* 10.0 12.1  --   NEUTROABS  --  4.3 4.2  --   --   HGB 12.1 14.6 14.8  --  11.2*  HCT 37.2 43.1 44.2  --  37  MCV 87.3 84.0 88.0  --   --   PLT 238 271.0 267.0  --  539*   TSH:  Recent Labs  06/27/15 0902  TSH 2.43   A1C: Lab Results  Component Value Date   HGBA1C 5.6 06/27/2015   Lipid Panel:  Recent Labs  01/01/15 0921 06/27/15 0902  CHOL 243* 207*  HDL 59.60 58.30  LDLCALC 155* 119*  TRIG 141.0 147.0  CHOLHDL 4 4    Radiological Exams: Dg Lumbar Spine Complete  11/15/2014  CLINICAL DATA:  Pt states that she fell today and is having lower back pains, states that she had lumbar surgery yesterday and fell today EXAM: LUMBAR SPINE - COMPLETE 4+ VIEW COMPARISON:  07/28/2013 FINDINGS: No fracture.  Mild dextroscoliosis with the apex at L 2. Mild retrolisthesis, grade 1, of L2 on L3. No other spondylolisthesis. Moderate loss disc height at L1-L2 and L2-L3 with mild loss disc height at L3-L4. L4-L5 and L5-S1 discs are relatively well preserved There is facet degenerative change at L4-L5 and L5-S1. Bones are demineralized. Soft tissues are unremarkable. IMPRESSION: 1. No fracture or acute finding. 2. Degenerative changes as described without significant change from the prior radiographs. Electronically Signed   By: Lajean Manes M.D.   On: 11/15/2014 21:20   Ct Head Wo Contrast  11/15/2014  CLINICAL DATA:  Dizziness and fall.  Blow to the bridge of the nose. EXAM: CT HEAD WITHOUT  CONTRAST TECHNIQUE: Contiguous axial images were obtained from the base of the skull through the vertex without intravenous contrast. COMPARISON:  Head CT scan 05/2003/ 2010. FINDINGS: The brain appears normal without hemorrhage, infarct, mass lesion, mass effect, midline shift or abnormal extra-axial fluid collection. Brain volume appears appropriate for age. No fracture is identified. Imaged paranasal sinuses and mastoid air cells are clear. IMPRESSION: Negative head CT scan. Electronically Signed   By: Inge Rise M.D.   On: 11/15/2014 21:41    Assessment/Plan 1. Spinal stenosis of lumbar region S/p lumbar revision with L1-3 fusion. Pain under control.  Ongoing follow up with neurosurgery. Continue to wear back brace and take back precautions.   2. Essential hypertension, benign Stable, conts on norvasc 10 mg daily, lasix and toprol   3. Constipation, unspecified constipation type Controlled on current regimen  4. Neuropathic pain Stable on gabapentin 300 mg TID  5. UTI (lower urinary tract infection) Found to have UA during rehab, completed course of cipro and doing well No further treatment at this time.   pt is stable for discharge-will need PT/OT per home health. No DME needed. Rx written.  will need to follow up with PCP within 2 weeks.  Carlos American. Harle Battiest  [facility] [phone] 8 am - 5 pm) [phone] (after hours)

## 2015-10-12 DIAGNOSIS — M8580 Other specified disorders of bone density and structure, unspecified site: Secondary | ICD-10-CM | POA: Diagnosis not present

## 2015-10-12 DIAGNOSIS — M4326 Fusion of spine, lumbar region: Secondary | ICD-10-CM | POA: Diagnosis not present

## 2015-10-12 DIAGNOSIS — Z4789 Encounter for other orthopedic aftercare: Secondary | ICD-10-CM | POA: Diagnosis not present

## 2015-10-12 DIAGNOSIS — I10 Essential (primary) hypertension: Secondary | ICD-10-CM | POA: Diagnosis not present

## 2015-10-12 DIAGNOSIS — M1711 Unilateral primary osteoarthritis, right knee: Secondary | ICD-10-CM | POA: Diagnosis not present

## 2015-10-12 DIAGNOSIS — G629 Polyneuropathy, unspecified: Secondary | ICD-10-CM | POA: Diagnosis not present

## 2015-10-22 ENCOUNTER — Ambulatory Visit (INDEPENDENT_AMBULATORY_CARE_PROVIDER_SITE_OTHER): Payer: Medicare Other | Admitting: Family Medicine

## 2015-10-22 ENCOUNTER — Encounter: Payer: Self-pay | Admitting: Family Medicine

## 2015-10-22 VITALS — BP 114/72 | HR 61 | Temp 98.4°F | Ht 61.5 in | Wt 167.5 lb

## 2015-10-22 DIAGNOSIS — N3000 Acute cystitis without hematuria: Secondary | ICD-10-CM

## 2015-10-22 DIAGNOSIS — R300 Dysuria: Secondary | ICD-10-CM | POA: Diagnosis not present

## 2015-10-22 DIAGNOSIS — R739 Hyperglycemia, unspecified: Secondary | ICD-10-CM | POA: Diagnosis not present

## 2015-10-22 DIAGNOSIS — N289 Disorder of kidney and ureter, unspecified: Secondary | ICD-10-CM | POA: Diagnosis not present

## 2015-10-22 DIAGNOSIS — N390 Urinary tract infection, site not specified: Secondary | ICD-10-CM | POA: Insufficient documentation

## 2015-10-22 DIAGNOSIS — I10 Essential (primary) hypertension: Secondary | ICD-10-CM | POA: Diagnosis not present

## 2015-10-22 DIAGNOSIS — M4806 Spinal stenosis, lumbar region: Secondary | ICD-10-CM

## 2015-10-22 DIAGNOSIS — M48061 Spinal stenosis, lumbar region without neurogenic claudication: Secondary | ICD-10-CM

## 2015-10-22 DIAGNOSIS — J069 Acute upper respiratory infection, unspecified: Secondary | ICD-10-CM | POA: Insufficient documentation

## 2015-10-22 LAB — POC URINALSYSI DIPSTICK (AUTOMATED)
Bilirubin, UA: NEGATIVE
GLUCOSE UA: NEGATIVE
Ketones, UA: NEGATIVE
Nitrite, UA: NEGATIVE
PH UA: 6
Protein, UA: 15
SPEC GRAV UA: 1.02
UROBILINOGEN UA: 0.2

## 2015-10-22 MED ORDER — CIPROFLOXACIN HCL 250 MG PO TABS: 250.0000 mg | ORAL_TABLET | Freq: Two times a day (BID) | ORAL | Status: DC

## 2015-10-22 NOTE — Progress Notes (Signed)
Subjective:    Patient ID: Linda Robinson, female    DOB: September 01, 1929, 82 y.o.   MRN: [id number]  HPI Here for f/u following SNF stay after spinal fusion (revision) at 2 levels   Back no longer hurts Her back brace rubs and that drives her crazy -has to wear until 3/17 No longer has to sleep in it    Had surgery in Dec -that went well  D/C to [facility] place after that  Noted that she was tx for uti while there with cipro  Has hx of renal insuff  Lab Results  Component Value Date   CREATININE 0.96 06/27/2015   BUN 19 06/27/2015   NA 142 06/27/2015   K 4.2 06/27/2015   CL 103 06/27/2015   CO2 33* 06/27/2015    Mild anemia post op Lab Results  Component Value Date   WBC 12.1 09/26/2015   HGB 11.2* 10/01/2015   HCT 37 10/01/2015   MCV 88.0 06/27/2015   PLT 539* 10/01/2015     bp is stable today  No cp or palpitations or headaches or edema  No side effects to medicines  BP Readings from Last 3 Encounters:  10/22/15 114/72  10/07/15 132/58  09/24/15 148/75      Mood- really trying to keep a positive attitude Found out 2 DIL have cancer  A lot of stress  Frustrated over what she can't do yet  Using a cane   PT and OT are coming out  Balance is overall much better -has a walker when she needs it   Also caught a cold on top of it all    Thinks she may have another uti  UA pos for leuk and blood cells today She thinks the symptoms started back the day before she left  Raw feeling when she urinates-almost burning Bladder is uncomfortable  Not a lot of frequency - not a lot of volume  Trying to keep her water intake up  A little coffee- yesterday  No visible blood in urine    Patient Active Problem List   Diagnosis Date Noted  . Viral URI 10/22/2015  . UTI (urinary tract infection) 10/22/2015  . Reaction to influenza immunization 08/09/2015  . Routine general medical examination at a health care facility 07/05/2015  . Estrogen deficiency 07/05/2015  .  Hyperglycemia 01/01/2015  . Lumbar spinal stenosis 11/13/2014  . Vertigo 10/05/2014  . Elevated transaminase level 07/16/2014  . Leukocytosis 07/16/2014  . Risk for falls 06/27/2014  . Fall (on)(from) incline, initial encounter 09/15/2013  . Low back pain on right side with sciatica 07/24/2013  . Edema 04/26/2013  . Encounter for Medicare annual wellness exam 04/11/2013  . Colon cancer screening 02/10/2012  . BRADYCARDIA 05/19/2010  . Disorder of bone and cartilage 01/30/2009  . CONSTIPATION 11/01/2007  . ANXIETY STATE NEC 02/14/2007  . REACTION, ACUTE STRESS W/EMOTIONAL DSTURB 02/14/2007  . Depression with anxiety 02/14/2007  . Hyperlipidemia LDL goal <130 02/04/2007  . Gout 02/04/2007  . Essential hypertension 02/04/2007  . DIVERTICULOSIS, COLON 02/04/2007  . Renal insufficiency 02/04/2007  . OSTEOARTHRITIS 02/04/2007  . [place] DISEASE 02/04/2007  . LEG CRAMPS 02/04/2007   Past Medical History  Diagnosis Date  . History of diverticulitis of colon   . Diverticulosis of colon   . Gout     hx of;was on Allopurinol but taken off 6 wks ago by medical MD  . HLD (hyperlipidemia)     takes Pravastatin daily  .  Osteopenia     takes Calcium and Vit D daily  . GERD (gastroesophageal reflux disease)     atypical chest pain  . Basal cell carcinoma     removed  . HTN (hypertension)     takes Amlodipine and Metoprolol daily  . Vertigo     takes Antivert daily as needed  . Constipation     takes Colace daily  . Depression     takes Zoloft daily  . OA (osteoarthritis)     right knee  . Chronic back pain     stenosis   Past Surgical History  Procedure Laterality Date  . Cataract extraction Right 04/2006  . Cholecystectomy    . Partial colectomy  09/1999    d/t diverticulitis  . Neck surgery      x 2;fusion  . Knee surgery Right     Medial meniscus tear  . Back surgery    . Tubal ligation    . Appendectomy  at age 3  . Colonoscopy    . Lumbar  laminectomy/decompression microdiscectomy Bilateral 11/13/2014    Procedure: Lumbar one-two Bilateral laminectomy and foraminotomy, Left Lumbar one-two microdiscectomy ;  Surgeon: Faythe Ghee, MD;  Location: [facility];  Service: Neurosurgery;  Laterality: Bilateral;   Social History  Substance Use Topics  . Smoking status: Never Smoker   . Smokeless tobacco: None  . Alcohol Use: No   Family History  Problem Relation Age of Onset  . Cancer Father     throat and bladder  . Diabetes Father   . Hypertension Father   . Heart attack Father   . Stroke Mother   . Alzheimer's disease Mother   . Hypertension Mother    Allergies  Allergen Reactions  . Ace Inhibitors     REACTION: increased K+, increased creat.  . Cephalosporins     REACTION: reaction not known  . Colesevelam     REACTION: constipation  . Lovastatin     REACTION: doesn't work  . Raloxifene     REACTION: cramps  . Rosuvastatin     REACTION: myalgia  . Simvastatin     REACTION: myalgia   Current Outpatient Prescriptions on File Prior to Visit  Medication Sig Dispense Refill  . amLODipine (NORVASC) 10 MG tablet Take 1 tablet (10 mg total) by mouth daily. 90 tablet 3  . Calcium Carbonate-Vitamin D (CALCIUM + D PO) Take 1 tablet by mouth daily.    . Cranberry 250 MG CAPS Take 1 capsule by mouth daily.      Marland Kitchen docusate sodium (COLACE) 100 MG capsule Take 200 mg by mouth daily.      . fish oil-omega-3 fatty acids 1000 MG capsule Take 1 g by mouth 2 (two) times daily.     . furosemide (LASIX) 20 MG tablet Take 1 pill by mouth once daily as needed for swelling 90 tablet 3  . gabapentin (NEURONTIN) 300 MG capsule Take 1 capsule by mouth 3 (three) times daily.    . meclizine (ANTIVERT) 25 MG tablet Take 25 mg by mouth 4 (four) times daily as needed for dizziness.     . metoprolol succinate (TOPROL-XL) 50 MG 24 hr tablet Take 1 tablet (50 mg total) by mouth daily. Take with or immediately following a meal. 180 tablet 1    . polycarbophil (FIBERCON) 625 MG tablet Take 625 mg by mouth daily as needed for diarrhea or loose stools.     . pravastatin (PRAVACHOL) 20 MG tablet  TAKE ONE TABLET TO ONE TABLET DAILY 90 tablet 1  . sertraline (ZOLOFT) 50 MG tablet Take 1 tablet (50 mg total) by mouth daily. 90 tablet 1   No current facility-administered medications on file prior to visit.    Review of Systems Review of Systems  Constitutional: Negative for fever, appetite change,  and unexpected weight change. pos for fatigue ENT pos for cong and rhinorrhea/neg for ST or sinus pain  Eyes: Negative for pain and visual disturbance.  Respiratory: Negative for wheeze and shortness of breath.   Cardiovascular: Negative for cp or palpitations    Gastrointestinal: Negative for nausea, diarrhea and constipation.  Genitourinary: Negative for urgency and frequency. pos for dysuria and bladder discomfort  MSK pos for back pain that is much improved after surgery Skin: Negative for pallor or rash   Neurological: Negative for weakness, light-headedness, numbness and headaches.  Hematological: Negative for adenopathy. Does not bruise/bleed easily.  Psychiatric/Behavioral: Negative for dysphoric mood. The patient is not nervous/anxious.         Objective:   Physical Exam  Constitutional: She appears well-developed and well-nourished. No distress.  obese and well appearing   HENT:  Head: Normocephalic and atraumatic.  Right Ear: External ear normal.  Left Ear: External ear normal.  Mouth/Throat: Oropharynx is clear and moist.  Nares are injected and congested  No sinus tenderness Clear rhinorrhea and post nasal drip   Eyes: Conjunctivae and EOM are normal. Pupils are equal, round, and reactive to light. Right eye exhibits no discharge. Left eye exhibits no discharge.  Neck: Normal range of motion. Neck supple. No JVD present. Carotid bruit is not present. No thyromegaly present.  Cardiovascular: Normal rate, regular  rhythm, normal heart sounds and intact distal pulses.  Exam reveals no gallop.   Pulmonary/Chest: Effort normal and breath sounds normal. No respiratory distress. She has no wheezes. She has no rales. She exhibits no tenderness.  No crackles  Abdominal: Soft. Bowel sounds are normal. She exhibits no distension, no abdominal bruit and no mass. There is no tenderness.  No suprapubic tenderness or fullness   No cva tenderness  Musculoskeletal: She exhibits no edema.  Lymphadenopathy:    She has no cervical adenopathy.  Neurological: She is alert. She has normal reflexes.  Gait is stable and confident with cane today Able to get on table without problems   Skin: Skin is warm and dry. No rash noted. No pallor.  Midline lumbar incision is well healed   Psychiatric: She has a normal mood and affect.          Assessment & Plan:   Problem List Items Addressed This Visit      Cardiovascular and Mediastinum   Essential hypertension (Chronic)    Stable bp s/p surgery and SNF stay  Lab today       Relevant Orders   CBC with Differential/Platelet     Respiratory   Viral URI    Mild with reassuring exam  Disc symptomatic care - see instructions on AVS  Update if not starting to improve in a week or if worsening  -esp if fever or prod cough        Genitourinary   Renal insufficiency    This was stable around time of her surgery- she has had utis since Lab today        Relevant Orders   Comprehensive metabolic panel   UTI (urinary tract infection)    Pos ua today -symptomatic Cover with cipro  cx pending Enc water intake  Per pt -was tx for one uti in SNF as well       Relevant Orders   Urine culture     Other   Hyperglycemia    S/p surgery and SNF stay  Pt states appetite it not optimal No episodes of hypoglycemia known A1C today      Relevant Orders   Hemoglobin A1c   Lumbar spinal stenosis - Primary    Much improved symptoms after lumbar fusion revision by  Dr Patrice Paradise  Wants to start weaning off of her gabapentin- will check in with them  Using cane -enc walker if she is unsteady Continues PT and OT at home        Other Visit Diagnoses    Dysuria        Relevant Orders    POCT Urinalysis Dipstick (Automated) (Completed)

## 2015-10-22 NOTE — Assessment & Plan Note (Signed)
S/p surgery and SNF stay  Pt states appetite it not optimal No episodes of hypoglycemia known A1C today

## 2015-10-22 NOTE — Assessment & Plan Note (Signed)
Stable bp s/p surgery and SNF stay  Lab today

## 2015-10-22 NOTE — Assessment & Plan Note (Signed)
Pos ua today -symptomatic Cover with cipro  cx pending Enc water intake  Per pt -was tx for one uti in SNF as well

## 2015-10-22 NOTE — Patient Instructions (Signed)
If back pain is better and Dr Patrice Paradise is ok with it - you may be able to wean off the gabapentin  We will send urine for a culture  Take cipro for the urinary infection - if we need to change it we will let you know  Push the water intake  For cold symptoms- zyrtec over the counter is ok for runny nose mucinex for nasal and chest congestion  Get enough rest Continue PT  Use walker when you need it if you feel more unsteady   Lab today

## 2015-10-22 NOTE — Progress Notes (Signed)
Pre visit review using our clinic review tool, if applicable. No additional management support is needed unless otherwise documented below in the visit note. 

## 2015-10-22 NOTE — Assessment & Plan Note (Signed)
This was stable around time of her surgery- she has had utis since Lab today

## 2015-10-22 NOTE — Assessment & Plan Note (Signed)
Mild with reassuring exam  Disc symptomatic care - see instructions on AVS  Update if not starting to improve in a week or if worsening  -esp if fever or prod cough

## 2015-10-22 NOTE — Assessment & Plan Note (Signed)
Much improved symptoms after lumbar fusion revision by Dr Patrice Paradise  Wants to start weaning off of her gabapentin- will check in with them  Using cane -enc walker if she is unsteady Continues PT and OT at home

## 2015-10-23 LAB — CBC WITH DIFFERENTIAL/PLATELET
BASOS PCT: 1.9 % (ref 0.0–3.0)
Basophils Absolute: 0.2 10*3/uL — ABNORMAL HIGH (ref 0.0–0.1)
EOS ABS: 0.8 10*3/uL — AB (ref 0.0–0.7)
EOS PCT: 7.3 % — AB (ref 0.0–5.0)
HEMATOCRIT: 39.6 % (ref 36.0–46.0)
HEMOGLOBIN: 12.8 g/dL (ref 12.0–15.0)
LYMPHS PCT: 52.1 % — AB (ref 12.0–46.0)
Lymphs Abs: 5.4 10*3/uL — ABNORMAL HIGH (ref 0.7–4.0)
MCHC: 32.2 g/dL (ref 30.0–36.0)
MCV: 87.2 fl (ref 78.0–100.0)
MONOS PCT: 7.1 % (ref 3.0–12.0)
Monocytes Absolute: 0.7 10*3/uL (ref 0.1–1.0)
NEUTROS ABS: 3.3 10*3/uL (ref 1.4–7.7)
Neutrophils Relative %: 31.6 % — ABNORMAL LOW (ref 43.0–77.0)
PLATELETS: 310 10*3/uL (ref 150.0–400.0)
RBC: 4.55 Mil/uL (ref 3.87–5.11)
RDW: 13.8 % (ref 11.5–15.5)
WBC: 10.4 10*3/uL (ref 4.0–10.5)

## 2015-10-23 LAB — COMPREHENSIVE METABOLIC PANEL
ALBUMIN: 3.9 g/dL (ref 3.5–5.2)
ALT: 9 U/L (ref 0–35)
AST: 18 U/L (ref 0–37)
Alkaline Phosphatase: 76 U/L (ref 39–117)
BILIRUBIN TOTAL: 0.5 mg/dL (ref 0.2–1.2)
BUN: 18 mg/dL (ref 6–23)
CALCIUM: 9.4 mg/dL (ref 8.4–10.5)
CHLORIDE: 101 meq/L (ref 96–112)
CO2: 31 meq/L (ref 19–32)
CREATININE: 1.14 mg/dL (ref 0.40–1.20)
GFR: 48.18 mL/min — AB (ref >60.00)
Glucose, Bld: 115 mg/dL — ABNORMAL HIGH (ref 70–99)
Potassium: 4 mEq/L (ref 3.5–5.1)
Sodium: 140 mEq/L (ref 135–145)
Total Protein: 6.8 g/dL (ref 6.0–8.3)

## 2015-10-23 LAB — HEMOGLOBIN A1C: HEMOGLOBIN A1C: 5.4 % (ref 4.6–6.5)

## 2015-10-23 LAB — URINE CULTURE
COLONY COUNT: NO GROWTH
ORGANISM ID, BACTERIA: NO GROWTH

## 2015-10-24 ENCOUNTER — Telehealth: Payer: Self-pay | Admitting: Family Medicine

## 2015-10-24 DIAGNOSIS — N3000 Acute cystitis without hematuria: Secondary | ICD-10-CM

## 2015-10-24 DIAGNOSIS — R300 Dysuria: Secondary | ICD-10-CM | POA: Insufficient documentation

## 2015-10-24 NOTE — Telephone Encounter (Signed)
Please inst her to drink water and avoid other beverages - esp artificial sweeteners/acidic bev or caffeine since these can irritate the urinary tract  I did the referral

## 2015-10-24 NOTE — Telephone Encounter (Signed)
-----   Message from Tammi Sou, [place] sent at 10/24/2015 11:45 AM EST ----- Pt notified of Dr. Marliss Coots comments, pt is still having same sxs but just not as bad so she would like to see a urologist, please put referral in and I advise pt one of our [facility] will call her to schedule appt

## 2015-10-25 NOTE — Telephone Encounter (Signed)
What fmla paperwork???

## 2015-10-29 NOTE — Telephone Encounter (Signed)
Appt made with [facility] and patient aware.

## 2015-10-30 ENCOUNTER — Encounter: Payer: Self-pay | Admitting: Obstetrics and Gynecology

## 2015-10-30 ENCOUNTER — Ambulatory Visit (INDEPENDENT_AMBULATORY_CARE_PROVIDER_SITE_OTHER): Payer: Medicare Other | Admitting: Obstetrics and Gynecology

## 2015-10-30 VITALS — BP 136/70 | HR 63 | Resp 18 | Ht 61.0 in | Wt 167.9 lb

## 2015-10-30 DIAGNOSIS — R300 Dysuria: Secondary | ICD-10-CM

## 2015-10-30 LAB — URINALYSIS, COMPLETE
BILIRUBIN UA: NEGATIVE
GLUCOSE, UA: NEGATIVE
NITRITE UA: NEGATIVE
RBC UA: NEGATIVE
SPEC GRAV UA: 1.025 (ref 1.005–1.030)
Urobilinogen, Ur: 0.2 mg/dL (ref 0.2–1.0)
pH, UA: 5.5 (ref 5.0–7.5)

## 2015-10-30 LAB — BLADDER SCAN AMB NON-IMAGING: Scan Result: 32

## 2015-10-30 LAB — MICROSCOPIC EXAMINATION: Epithelial Cells (non renal): >10 /hpf — ABNORMAL HIGH (ref 0–10)

## 2015-10-30 NOTE — Addendum Note (Signed)
Addended by: Bettye Boeck on: 10/30/2015 04:56 PM   Modules accepted: Orders

## 2015-10-30 NOTE — Progress Notes (Signed)
10/30/2015 2:21 PM   Linda Robinson 1929/09/10 [id number]  Referring provider: Abner Greenspan, MD [address] [place], [place] [address]  Chief Complaint  Patient presents with  . Dysuria  . Establish Care    HPI: Patient is an 82 year old female presenting today as a referral from her primary care provider's office with complaints of intermittent super pubic aching and dysuria. She reports her symptoms have been occurring ever since she had surgery on her back and has been required to wear a back brace. The brace applies pressure to her suprapubic area and reports it is very uncomfortable for her. She states that she was treated for urinary tract infection while she was in a skilled nursing facility. She reports her symptoms did not improve with antibiotic therapy. Her follow-up culture was negative.  She denies any exacerbating or relieving factors other than worsening discomfort when her back brace applies pressure to her suprapubic area. She denies any history of kidney stones. No gross hematuria.   She denies any other significant urologic history other then infrequent urinary tract infection.  She is scheduled to be fitted for a new back brace in 2 days which will hopefully not press on her abdomen as much as her current one.  PMH: Past Medical History  Diagnosis Date  . History of diverticulitis of colon   . Diverticulosis of colon   . Gout     hx of;was on Allopurinol but taken off 6 wks ago by medical MD  . HLD (hyperlipidemia)     takes Pravastatin daily  . Osteopenia     takes Calcium and Vit D daily  . GERD (gastroesophageal reflux disease)     atypical chest pain  . Basal cell carcinoma     removed  . HTN (hypertension)     takes Amlodipine and Metoprolol daily  . Vertigo     takes Antivert daily as needed  . Constipation     takes Colace daily  . Depression     takes Zoloft daily  . OA (osteoarthritis)     right knee  .  Chronic back pain     stenosis    Surgical History: Past Surgical History  Procedure Laterality Date  . Cataract extraction Right 04/2006  . Cholecystectomy    . Partial colectomy  09/1999    d/t diverticulitis  . Neck surgery      x 2;fusion  . Knee surgery Right     Medial meniscus tear  . Back surgery    . Tubal ligation    . Appendectomy  at age 58  . Colonoscopy    . Lumbar laminectomy/decompression microdiscectomy Bilateral 11/13/2014    Procedure: Lumbar one-two Bilateral laminectomy and foraminotomy, Left Lumbar one-two microdiscectomy ;  Surgeon: Faythe Ghee, MD;  Location: [facility];  Service: Neurosurgery;  Laterality: Bilateral;    Home Medications:    Medication List       This list is accurate as of: 10/30/15  2:21 PM.  Always use your most recent med list.               amLODipine 10 MG tablet  Commonly known as:  NORVASC  Take 1 tablet (10 mg total) by mouth daily.     CALCIUM 500 + D 500-125 MG-UNIT Tabs  Generic drug:  Calcium Carbonate-Vitamin D  Take by mouth.     Cranberry 250 MG Caps  Take 1 capsule by mouth  daily.     docusate sodium 100 MG capsule  Commonly known as:  COLACE  Take 200 mg by mouth daily.     fish oil-omega-3 fatty acids 1000 MG capsule  Take 1 g by mouth 2 (two) times daily.     furosemide 20 MG tablet  Commonly known as:  LASIX  Take 1 pill by mouth once daily as needed for swelling     gabapentin 300 MG capsule  Commonly known as:  NEURONTIN  Take 1 capsule by mouth 3 (three) times daily.     meclizine 25 MG tablet  Commonly known as:  ANTIVERT  Take 25 mg by mouth 4 (four) times daily as needed for dizziness.     metoprolol succinate 50 MG 24 hr tablet  Commonly known as:  TOPROL-XL  Take 1 tablet (50 mg total) by mouth daily. Take with or immediately following a meal.     polycarbophil 625 MG tablet  Commonly known as:  FIBERCON  Take 625 mg by mouth daily as needed for diarrhea or loose stools.      pravastatin 20 MG tablet  Commonly known as:  PRAVACHOL  TAKE ONE TABLET TO ONE TABLET DAILY     sertraline 50 MG tablet  Commonly known as:  ZOLOFT  Take 1 tablet (50 mg total) by mouth daily.        Allergies:  Allergies  Allergen Reactions  . Ace Inhibitors     REACTION: increased K+, increased creat.  . Cephalosporins     REACTION: reaction not known  . Colesevelam     REACTION: constipation  . Lovastatin     REACTION: doesn't work  . Raloxifene     REACTION: cramps  . Rosuvastatin     REACTION: myalgia  . Simvastatin     REACTION: myalgia    Family History: Family History  Problem Relation Age of Onset  . Cancer Father     throat and bladder  . Diabetes Father   . Hypertension Father   . Heart attack Father   . Stroke Mother   . Alzheimer's disease Mother   . Hypertension Mother     Social History:  reports that she has never smoked. She does not have any smokeless tobacco history on file. She reports that she does not drink alcohol or use illicit drugs.  ROS: UROLOGY Frequent Urination?: No Hard to postpone urination?: Yes Burning/pain with urination?: Yes Get up at night to urinate?: Yes Leakage of urine?: Yes Urine stream starts and stops?: No Trouble starting stream?: No Do you have to strain to urinate?: No Blood in urine?: No Urinary tract infection?: No Sexually transmitted disease?: No Injury to kidneys or bladder?: No Painful intercourse?: No Weak stream?: Yes Currently pregnant?: No Vaginal bleeding?: No Last menstrual period?: n/a  Gastrointestinal Nausea?: No Vomiting?: No Indigestion/heartburn?: No Diarrhea?: Yes Constipation?: No  Constitutional Fever: No Night sweats?: No Weight loss?: No Fatigue?: No  Skin Skin rash/lesions?: No Itching?: No  Eyes Blurred vision?: No Double vision?: No  Ears/Nose/Throat Sore throat?: No Sinus problems?: No  Hematologic/Lymphatic Swollen glands?: No Easy bruising?:  Yes  Cardiovascular Leg swelling?: No Chest pain?: No  Respiratory Cough?: No Shortness of breath?: No  Endocrine Excessive thirst?: No  Musculoskeletal Back pain?: No Joint pain?: No  Neurological Headaches?: No Dizziness?: Yes  Psychologic Depression?: No Anxiety?: No  Physical Exam: BP 136/70 mmHg  Pulse 63  Resp 18  Ht 5\' 1"  (1.549 m)  Wt 167  lb 14.4 oz (76.159 kg)  BMI 31.74 kg/m2  Constitutional:  Alert and oriented, No acute distress. HEENT: [place] AT, moist mucus membranes.  Trachea midline, no masses. Cardiovascular: No clubbing, cyanosis, or edema. Respiratory: Normal respiratory effort, no increased work of breathing. GI: Abdomen is soft, nontender, nondistended, no abdominal masses GU: No CVA tenderness.  Pelvic Exam:  Pale vaginal mucosa, decrease moisture, atrophic narrow introitus, normal urethra, no obvious prolapse Skin: No rashes, bruises or suspicious lesions. Lymph: No cervical or inguinal adenopathy. Neurologic: Grossly intact, no focal deficits, moving all 4 extremities. Psychiatric: Normal mood and affect.  Laboratory Data:  Lab Results  Component Value Date   WBC 10.4 10/22/2015   HGB 12.8 10/22/2015   HCT 39.6 10/22/2015   MCV 87.2 10/22/2015   PLT 310.0 10/22/2015    Lab Results  Component Value Date   CREATININE 1.14 10/22/2015    No results found for: PSA  No results found for: TESTOSTERONE  Lab Results  Component Value Date   HGBA1C 5.4 10/22/2015    Urinalysis Results for orders placed or performed in visit on 10/30/15  BLADDER SCAN AMB NON-IMAGING  Result Value Ref Range   Scan Result 32 mL       Pertinent Imaging:   Assessment & Plan:    1. Dysuria- I suspect patient's intermittent dysuria and has most likely due to to irritation of her atrophic vaginal mucosa during urination. Vaginal estrogen cream samples provided.  UA slightly suspicious for infection and sent for culture. UTI prevention strategies  discussed.  Good perineal hygiene reviewed. Patient is encouraged to increase daily water intake, start cranberry supplements to prevent invasive colonization along the urinary tract and probiotics, especially lactobacillus to restore normal vaginal flora. - Urinalysis, Complete -Urine Culture - BLADDER SCAN AMB NON-IMAGING  2. Suprapubic pain-  Patient reports that she believes her discomfort is related to her back brace. She is scheduled to be fitted for a new brace in 2 days. We will recheck her symptoms one month when she has her new brace on.  3. Vaginal Atrophy- Estrace samples provided.   Return in about 1 month (around 11/27/2015) for recheck dysuria/suprapubic pain.  These notes generated with voice recognition software. I apologize for typographical errors.  Herbert Moors, [facility] [address], [place], [place] [address] [phone]

## 2015-10-30 NOTE — Patient Instructions (Signed)
Vaginal Estrogen Cream apply a blueberry sized amount to vaginal opening using finger tip nightly for 2 weeks then use 3 times weekly before bed.                                              Urinary Tract Infection Prevention Patient Education Stay Hydrated: Urinary tract infections (UTIs) are less likely to occur in someone who is drinking enough water to promote regular urination, so it is very important to stay hydrated in order to help flush out bacteria from the urinary tract. Respond to "Nature's Call": It is always a good idea to urinate as soon as you feel the need. While "holding it in" does not directly cause an infection, it can cause overdistension that can damage the lining of the bladder, making it more vulnerable to bacteria. Remove Tampons Before Going: Remember to always take out tampons before urinating, and change tampons often.  Practice Proper Bathroom Hygiene: To keep bacteria near the urethral opening to a minimum, it is important to practice proper wiping techniques (i.e. front to back wiping) to help prevent rectal bacteria from entering the uretro-genital area. It can also be helpful to take showers and avoid soaking in the bathtub.  Take a Vitamin C Supplement: About 1,000 milligrams of vitamin C taken daily can help inhibit the growth of some bacteria by acidifying the urine. Maintain Control with Cranberries: Cranberries contain hippuronic acid, which is a natural antiseptic that may help prevent the adherence of bacteria to the bladder lining. Drinking 100% pure cranberry juice or taking over the counter cranberry supplements twice daily may help to prevent an infection. However, it is important to note that cranberry juices/supplements are not helpful once a urinary tract infection (UTI) is present. Strengthen Your Core: Often, a lazy bladder (unable to empty urine properly) occurs due to lower back problem, so consider doing exercises to help strengthen your back, pelvic  floor, and stomach muscles.  Pay Attention to Your Urine: Your urine can change color for a variety of reasons, including from the medications you take, so pay close attention to it to monitor your overall health. One key thing to note is that if your urine is typically a darker yellow, your body is dehydrated, so you need to step up your water intake.    Dysuria Dysuria is pain or discomfort while urinating. The pain or discomfort may be felt in the tube that carries urine out of the bladder (urethra) or in the surrounding tissue of the genitals. The pain may also be felt in the groin area, lower abdomen, and lower back. You may have to urinate frequently or have the sudden feeling that you have to urinate (urgency). Dysuria can affect both men and women, but is more common in women. Dysuria can be caused by many different things, including:  Urinary tract infection in women.  Infection of the kidney or bladder.  Kidney stones or bladder stones.  Certain sexually transmitted infections (STIs), such as chlamydia.  Dehydration.  Inflammation of the vagina.  Use of certain medicines.  Use of certain soaps or scented products that cause irritation. HOME CARE INSTRUCTIONS Watch your dysuria for any changes. The following actions may help to reduce any discomfort you are feeling:  Drink enough fluid to keep your urine clear or pale yellow.  Empty your bladder often. Avoid holding urine  for long periods of time.  After a bowel movement or urination, women should cleanse from front to back, using each tissue only once.  Empty your bladder after sexual intercourse.  Take medicines only as directed by your health care provider.  If you were prescribed an antibiotic medicine, finish it all even if you start to feel better.  Avoid caffeine, tea, and alcohol. They can irritate the bladder and make dysuria worse. In men, alcohol may irritate the prostate.  Keep all follow-up visits as  directed by your health care provider. This is important.  If you had any tests done to find the cause of dysuria, it is your responsibility to obtain your test results. Ask the lab or department performing the test when and how you will get your results. Talk with your health care provider if you have any questions about your results. SEEK MEDICAL CARE IF:  You develop pain in your back or sides.  You have a fever.  You have nausea or vomiting.  You have blood in your urine.  You are not urinating as often as you usually do. SEEK IMMEDIATE MEDICAL CARE IF:  You pain is severe and not relieved with medicines.  You are unable to hold down any fluids.  You or someone else notices a change in your mental function.  You have a rapid heartbeat at rest.  You have shaking or chills.  You feel extremely weak.   This information is not intended to replace advice given to you by your health care provider. Make sure you discuss any questions you have with your health care provider.   Document Released: 06/12/2004 Document Revised: 10/05/2014 Document Reviewed: 05/10/2014 Elsevier Interactive Patient Education [employer].

## 2015-10-31 ENCOUNTER — Other Ambulatory Visit: Payer: Medicare Other

## 2015-10-31 DIAGNOSIS — N390 Urinary tract infection, site not specified: Secondary | ICD-10-CM

## 2015-11-02 LAB — CULTURE, URINE COMPREHENSIVE

## 2015-11-04 ENCOUNTER — Telehealth: Payer: Self-pay

## 2015-11-04 NOTE — Telephone Encounter (Signed)
-----   Message from Roda Shutters, [place] sent at 11/04/2015  8:30 AM EST ----- Please notify patient a urine culture was negative for infection. thanks

## 2015-11-04 NOTE — Telephone Encounter (Signed)
Line busy

## 2015-11-05 NOTE — Telephone Encounter (Signed)
Spoke with pt in reference to -ucx. Pt voiced understanding.  

## 2015-11-27 ENCOUNTER — Ambulatory Visit (INDEPENDENT_AMBULATORY_CARE_PROVIDER_SITE_OTHER): Payer: Medicare Other | Admitting: Obstetrics and Gynecology

## 2015-11-27 ENCOUNTER — Encounter: Payer: Self-pay | Admitting: Obstetrics and Gynecology

## 2015-11-27 VITALS — BP 145/71 | HR 46 | Resp 16 | Ht 61.0 in | Wt 168.3 lb

## 2015-11-27 DIAGNOSIS — N952 Postmenopausal atrophic vaginitis: Secondary | ICD-10-CM | POA: Diagnosis not present

## 2015-11-27 MED ORDER — ESTRADIOL 0.1 MG/GM VA CREA: TOPICAL_CREAM | VAGINAL | Status: DC

## 2015-11-27 NOTE — Progress Notes (Signed)
1:56 PM   Linda Robinson April 09, 1929 [id number]  Referring provider: Abner Greenspan, MD [address] [place], [place] [address]  Chief Complaint  Patient presents with  . Dysuria  . Follow-up    HPI: Patient is an 82 year old female presenting today as a referral from her primary care provider's office with complaints of intermittent super pubic aching and dysuria. She reports her symptoms have been occurring ever since she had surgery on her back and has been required to wear a back brace. The brace applies pressure to her suprapubic area and reports it is very uncomfortable for her. She states that she was treated for urinary tract infection while she was in a skilled nursing facility. She reports her symptoms did not improve with antibiotic therapy. Her follow-up culture was negative.  She denies any exacerbating or relieving factors other than worsening discomfort when her back brace applies pressure to her suprapubic area. She denies any history of kidney stones. No gross hematuria.   She denies any other significant urologic history other then infrequent urinary tract infection.  She is scheduled to be fitted for a new back brace in 2 days which will hopefully not press on her abdomen as much as her current one.  Interval History 11/27/15:  Patient presents for f/u for dysuria.  She reports her symptoms have completely resolved with vaginal estrogen cream. Very minimal continued suprapubic discomfort from back brace.  PMH: Past Medical History  Diagnosis Date  . History of diverticulitis of colon   . Diverticulosis of colon   . Gout     hx of;was on Allopurinol but taken off 6 wks ago by medical MD  . HLD (hyperlipidemia)     takes Pravastatin daily  . Osteopenia     takes Calcium and Vit D daily  . GERD (gastroesophageal reflux disease)     atypical chest pain  . Basal cell carcinoma     removed  . HTN (hypertension)     takes Amlodipine  and Metoprolol daily  . Vertigo     takes Antivert daily as needed  . Constipation     takes Colace daily  . Depression     takes Zoloft daily  . OA (osteoarthritis)     right knee  . Chronic back pain     stenosis    Surgical History: Past Surgical History  Procedure Laterality Date  . Cataract extraction Right 04/2006  . Cholecystectomy    . Partial colectomy  09/1999    d/t diverticulitis  . Neck surgery      x 2;fusion  . Knee surgery Right     Medial meniscus tear  . Back surgery    . Tubal ligation    . Appendectomy  at age 35  . Colonoscopy    . Lumbar laminectomy/decompression microdiscectomy Bilateral 11/13/2014    Procedure: Lumbar one-two Bilateral laminectomy and foraminotomy, Left Lumbar one-two microdiscectomy ;  Surgeon: Faythe Ghee, MD;  Location: [facility];  Service: Neurosurgery;  Laterality: Bilateral;    Home Medications:    Medication List       This list is accurate as of: 11/27/15  1:56 PM.  Always use your most recent med list.               amLODipine 10 MG tablet  Commonly known as:  NORVASC  Take 1 tablet (10 mg total) by mouth daily.     CALCIUM 500 +  D 500-125 MG-UNIT Tabs  Generic drug:  Calcium Carbonate-Vitamin D  Take by mouth.     Cranberry 250 MG Caps  Take 1 capsule by mouth daily.     docusate sodium 100 MG capsule  Commonly known as:  COLACE  Take 200 mg by mouth daily.     estradiol 0.1 MG/GM vaginal cream  Commonly known as:  ESTRACE  Vaginal Estrogen Cream apply a blueberry sized amount to vaginal opening using finger tip 3 times weekly before bed.     fish oil-omega-3 fatty acids 1000 MG capsule  Take 1 g by mouth 2 (two) times daily.     furosemide 20 MG tablet  Commonly known as:  LASIX  Take 1 pill by mouth once daily as needed for swelling     gabapentin 300 MG capsule  Commonly known as:  NEURONTIN  Take 1 capsule by mouth 3 (three) times daily.     meclizine 25 MG tablet  Commonly known as:   ANTIVERT  Take 25 mg by mouth 4 (four) times daily as needed for dizziness.     metoprolol succinate 50 MG 24 hr tablet  Commonly known as:  TOPROL-XL  Take 1 tablet (50 mg total) by mouth daily. Take with or immediately following a meal.     polycarbophil 625 MG tablet  Commonly known as:  FIBERCON  Take 625 mg by mouth daily as needed for diarrhea or loose stools.     pravastatin 20 MG tablet  Commonly known as:  PRAVACHOL  TAKE ONE TABLET TO ONE TABLET DAILY     sertraline 50 MG tablet  Commonly known as:  ZOLOFT  Take 1 tablet (50 mg total) by mouth daily.        Allergies:  Allergies  Allergen Reactions  . Ace Inhibitors     REACTION: increased K+, increased creat.  . Cephalosporins     REACTION: reaction not known  . Colesevelam     REACTION: constipation  . Lovastatin     REACTION: doesn't work  . Raloxifene     REACTION: cramps  . Rosuvastatin     REACTION: myalgia  . Simvastatin     REACTION: myalgia    Family History: Family History  Problem Relation Age of Onset  . Cancer Father     throat and bladder  . Diabetes Father   . Hypertension Father   . Heart attack Father   . Stroke Mother   . Alzheimer's disease Mother   . Hypertension Mother     Social History:  reports that she has never smoked. She does not have any smokeless tobacco history on file. She reports that she does not drink alcohol or use illicit drugs.  ROS: UROLOGY Frequent Urination?: No Hard to postpone urination?: Yes Burning/pain with urination?: Yes Get up at night to urinate?: Yes Leakage of urine?: Yes Urine stream starts and stops?: No Trouble starting stream?: No Do you have to strain to urinate?: No Blood in urine?: No Urinary tract infection?: No Sexually transmitted disease?: No Injury to kidneys or bladder?: No Painful intercourse?: No Weak stream?: No Currently pregnant?: No Vaginal bleeding?: No Last menstrual period?: n  Gastrointestinal Nausea?:  No Vomiting?: No Indigestion/heartburn?: No Diarrhea?: No Constipation?: No  Constitutional Fever: No Night sweats?: No Weight loss?: No Fatigue?: No  Skin Skin rash/lesions?: No Itching?: No  Eyes Blurred vision?: No Double vision?: No  Ears/Nose/Throat Sore throat?: No Sinus problems?: No  Hematologic/Lymphatic Swollen glands?: No Easy  bruising?: No  Cardiovascular Leg swelling?: No Chest pain?: No  Respiratory Cough?: No Shortness of breath?: No  Endocrine Excessive thirst?: No  Musculoskeletal Back pain?: No Joint pain?: No  Neurological Headaches?: No Dizziness?: No  Psychologic Depression?: No Anxiety?: No  Physical Exam: BP 145/71 mmHg  Pulse 46  Resp 16  Ht 5\' 1"  (1.549 m)  Wt 168 lb 4.8 oz (76.34 kg)  BMI 31.82 kg/m2  Constitutional:  Alert and oriented, No acute distress. HEENT: [place] AT, moist mucus membranes.  Trachea midline, no masses. Cardiovascular: No clubbing, cyanosis, or edema. Respiratory: Normal respiratory effort, no increased work of breathing. GI: Abdomen is soft, nontender, nondistended, no abdominal masses GU: No CVA tenderness.  Pelvic Exam:  Pale vaginal mucosa, decrease moisture, atrophic narrow introitus, normal urethra, no obvious prolapse Skin: No rashes, bruises or suspicious lesions. Lymph: No cervical or inguinal adenopathy. Neurologic: Grossly intact, no focal deficits, moving all 4 extremities. Psychiatric: Normal mood and affect.  Laboratory Data:  Lab Results  Component Value Date   WBC 10.4 10/22/2015   HGB 12.8 10/22/2015   HCT 39.6 10/22/2015   MCV 87.2 10/22/2015   PLT 310.0 10/22/2015    Lab Results  Component Value Date   CREATININE 1.14 10/22/2015    No results found for: PSA  No results found for: TESTOSTERONE  Lab Results  Component Value Date   HGBA1C 5.4 10/22/2015    Urinalysis Results for orders placed or performed in visit on 10/31/15  CULTURE, URINE COMPREHENSIVE   Result Value Ref Range   Urine Culture, Comprehensive Final report    Result 1 Comment       Pertinent Imaging:   Assessment & Plan:    1. Dysuria-  Previous culture negative.   Dysuria much improved on estrogen cream. UTI prevention strategies discussed.  Good perineal hygiene reviewed. Patient is encouraged to increase daily water intake, start cranberry supplements to prevent invasive colonization along the urinary tract and probiotics, especially lactobacillus to restore normal vaginal flora. - Urinalysis, Complete -Urine Culture - BLADDER SCAN AMB NON-IMAGING  2. Suprapubic pain-  Improved.  3. Vaginal Atrophy- Estrace prescription sent to pharmacy today.   Return in about 1 year (around 11/26/2016).  These notes generated with voice recognition software. I apologize for typographical errors.  Herbert Moors, [facility] [address] [place], [place] [address] [phone]

## 2016-01-06 ENCOUNTER — Telehealth: Payer: Self-pay | Admitting: Family Medicine

## 2016-01-06 ENCOUNTER — Ambulatory Visit: Payer: Self-pay | Admitting: Internal Medicine

## 2016-01-06 NOTE — Telephone Encounter (Signed)
Patient Name: Linda Robinson  DOB: Feb 10, 1929    Initial Comment Caller states bp 182/70, on bp meds, asking if she needs to increase meds   Nurse Assessment  Nurse: Raphael Gibney, RN, Vanita Ingles Date/Time (Eastern Time): 01/06/2016 9:26:05 AM  Confirm and document reason for call. If symptomatic, describe symptoms. You must click the next button to save text entered. ---Caller states her BP was 182/70 when she got up. BP 167/63 now. She takes BP meds. Has had some headaches.  Has the patient traveled out of the country within the last 30 days? ---Not Applicable  Does the patient have any new or worsening symptoms? ---Yes  Will a triage be completed? ---Yes  Related visit to physician within the last 2 weeks? ---No  Does the PT have any chronic conditions? (i.e. diabetes, asthma, etc.) ---Yes  List chronic conditions. ---HTN  Is this a behavioral health or substance abuse call? ---No     Guidelines    Guideline Title Affirmed Question Affirmed Notes  High Blood Pressure [1] Taking BP medications AND [2] feels is having side effects (e.g., impotence, cough, dizzy upon standing)    Final Disposition User   See PCP When Office is Open (within 3 days) Raphael Gibney, RN, Vanita Ingles    Comments  Appt scheduled for 4:15 pm 01/06/16 with Dr. Viviana Simpler   Disagree/Comply: Comply

## 2016-01-06 NOTE — Telephone Encounter (Signed)
Pt has appt scheduled with Dr Darnell Level on 01/07/16 at 10:15.

## 2016-01-06 NOTE — Telephone Encounter (Signed)
Will see tomorrow

## 2016-01-07 ENCOUNTER — Encounter: Payer: Self-pay | Admitting: Family Medicine

## 2016-01-07 ENCOUNTER — Ambulatory Visit (INDEPENDENT_AMBULATORY_CARE_PROVIDER_SITE_OTHER): Payer: Medicare Other | Admitting: Family Medicine

## 2016-01-07 VITALS — BP 158/70 | HR 54 | Temp 97.8°F | Wt 162.2 lb

## 2016-01-07 DIAGNOSIS — I10 Essential (primary) hypertension: Secondary | ICD-10-CM

## 2016-01-07 DIAGNOSIS — F418 Other specified anxiety disorders: Secondary | ICD-10-CM

## 2016-01-07 LAB — BASIC METABOLIC PANEL
BUN: 19 mg/dL (ref 6–23)
CALCIUM: 9.6 mg/dL (ref 8.4–10.5)
CHLORIDE: 102 meq/L (ref 96–112)
CO2: 33 mEq/L — ABNORMAL HIGH (ref 19–32)
CREATININE: 1.04 mg/dL (ref 0.40–1.20)
GFR: 53.54 mL/min — ABNORMAL LOW (ref >60.00)
GLUCOSE: 102 mg/dL — AB (ref 70–99)
POTASSIUM: 3.9 meq/L (ref 3.5–5.1)
Sodium: 143 mEq/L (ref 135–145)

## 2016-01-07 MED ORDER — SERTRALINE HCL 50 MG PO TABS: 75.0000 mg | ORAL_TABLET | Freq: Every day | ORAL | Status: DC

## 2016-01-07 NOTE — Progress Notes (Signed)
BP 158/70 mmHg  Pulse 54  Temp(Src) 97.8 F (36.6 C) (Oral)  Wt 162 lb 4 oz (73.596 kg)  SpO2 95%   CC: elevated bp recently   Subjective:    Patient ID: Linda Robinson, female    DOB: Oct 21, 1928, 82 y.o.   MRN: [id number]  HPI: Linda Robinson is a 82 y.o. female presenting on 01/07/2016 for Hypertension   Pt of Dr Marliss Coots with h/o HTN, HLD, renal insufficiency and hyperglycemia presents today with concerns for elevated blood pressures at home. She endorses readings of 182/60s. Started checking last week. She did have mild headache and nervous with this. Endorses compliance with amlodipine 10mg  daily, toprol XL 50mg  daily,  and lasix 20mg  QD PRN swelling. She does take lasix 20mg  every day, effectively diuresis with this. No HA, vision changes, CP/tightness, SOB, leg swelling. Did have ham for lunch on Sunday.   She does stay well hydrated with water. Avoiding salt in diet. No NSAID use or caffeine use or decongestants. Denies pain.   She does endorse increased anxiety/nervousness - family stressors (husband's diabetes out of control after steroid injection, grandson having some problems). Requests increase in sertraline.   She did have spinal fusion with revision 08/2015, stayed at [facility]. Walks steady with cane.   BP Readings from Last 3 Encounters:  01/07/16 158/70  11/27/15 145/71  10/30/15 136/70     Relevant past medical, surgical, family and social history reviewed and updated as indicated. Interim medical history since our last visit reviewed. Allergies and medications reviewed and updated. Current Outpatient Prescriptions on File Prior to Visit  Medication Sig  . amLODipine (NORVASC) 10 MG tablet Take 1 tablet (10 mg total) by mouth daily.  . Calcium Carbonate-Vitamin D (CALCIUM 500 + D) 500-125 MG-UNIT TABS Take by mouth.  . Cranberry 250 MG CAPS Take 1 capsule by mouth daily.    Marland Kitchen docusate sodium (COLACE) 100 MG capsule Take 200 mg by mouth daily.    Marland Kitchen estradiol  (ESTRACE) 0.1 MG/GM vaginal cream Vaginal Estrogen Cream apply a blueberry sized amount to vaginal opening using finger tip 3 times weekly before bed.  . fish oil-omega-3 fatty acids 1000 MG capsule Take 1 g by mouth 2 (two) times daily.   Marland Kitchen gabapentin (NEURONTIN) 300 MG capsule Take 1 capsule by mouth 3 (three) times daily.  . meclizine (ANTIVERT) 25 MG tablet Take 25 mg by mouth 4 (four) times daily as needed for dizziness.   . metoprolol succinate (TOPROL-XL) 50 MG 24 hr tablet Take 1 tablet (50 mg total) by mouth daily. Take with or immediately following a meal.  . polycarbophil (FIBERCON) 625 MG tablet Take 625 mg by mouth daily as needed for diarrhea or loose stools.   . pravastatin (PRAVACHOL) 20 MG tablet TAKE ONE TABLET TO ONE TABLET DAILY   No current facility-administered medications on file prior to visit.    Review of Systems Per HPI unless specifically indicated in ROS section     Objective:    BP 158/70 mmHg  Pulse 54  Temp(Src) 97.8 F (36.6 C) (Oral)  Wt 162 lb 4 oz (73.596 kg)  SpO2 95%  Wt Readings from Last 3 Encounters:  01/07/16 162 lb 4 oz (73.596 kg)  11/27/15 168 lb 4.8 oz (76.34 kg)  10/30/15 167 lb 14.4 oz (76.159 kg)    Physical Exam  Constitutional: She appears well-developed and well-nourished. No distress.  HENT:  Mouth/Throat: Oropharynx is clear and moist. No oropharyngeal exudate.  Neck: No thyromegaly present.  Cardiovascular: Normal rate, regular rhythm, normal heart sounds and intact distal pulses.   No murmur heard. Pulmonary/Chest: Effort normal. No respiratory distress. She has no wheezes. She has no rales.  Musculoskeletal: She exhibits no edema.  Skin: Skin is warm and dry. No rash noted.  Psychiatric: She has a normal mood and affect.  Nursing note and vitals reviewed.  Lab Results  Component Value Date   CREATININE 1.14 10/22/2015       Assessment & Plan:  Over 25 minutes were spent face-to-face with the patient during this  encounter and >50% of that time was spent on counseling and coordination of care  Problem List Items Addressed This Visit    Essential hypertension - Primary (Chronic)    Deteriorated control recently - anticipate stress related. Discussed with patient.  Check BMP today. She is regularly taking lasix 20mg  daily - med list updated. No changes today - she will monitor bp over next 1-2 wks twice daily and keep log, then drop off or call us with readings and we can decide at that time regimen change if needed. Pt agrees with plan. Estrace was recently started - doubt topical cream causing elevated blood pressure but will need to monitor this.      Relevant Medications   furosemide (LASIX) 20 MG tablet   Other Relevant Orders   Basic metabolic panel   Depression with anxiety    Worsened anxiety from increased family stressors recently. Support provided, discussed possible relation to increased stress and elevated blood pressures recently.  Will increase sertraline to 75mg  daily per pt request. She will see if she can cut tablets in half, if not will send in 25mg  for total 75mg  dose daily. RTC 3-4 wks f/u visit with myself or PCP. Pt agrees.          Follow up plan: Return in about 4 weeks (around 02/04/2016), or as needed, for follow up visit.  Ria Bush, MD

## 2016-01-07 NOTE — Progress Notes (Signed)
Pre visit review using our clinic review tool, if applicable. No additional management support is needed unless otherwise documented below in the visit note. 

## 2016-01-07 NOTE — Assessment & Plan Note (Addendum)
Deteriorated control recently - anticipate stress related. Discussed with patient.  Check BMP today. She is regularly taking lasix 20mg  daily - med list updated. No changes today - she will monitor bp over next 1-2 wks twice daily and keep log, then drop off or call us with readings and we can decide at that time regimen change if needed. Pt agrees with plan. Estrace was recently started - doubt topical cream causing elevated blood pressure but will need to monitor this.

## 2016-01-07 NOTE — Patient Instructions (Addendum)
Increase sertarline to 75mg  daily.  I think increased blood pressure may be coming from stress. Monitor blood pressure at home twice daily (am and pm) over next week and call us with readings or drop off to review. We will decide after review if we need to change blood pressure medicine. Let us know sooner if any high blood pressure symptoms develop liek worsening headache, chest pain, shortness of breath or leg swelling.

## 2016-01-07 NOTE — Assessment & Plan Note (Signed)
Worsened anxiety from increased family stressors recently. Support provided, discussed possible relation to increased stress and elevated blood pressures recently.  Will increase sertraline to 75mg  daily per pt request. She will see if she can cut tablets in half, if not will send in 25mg  for total 75mg  dose daily. RTC 3-4 wks f/u visit with myself or PCP. Pt agrees.

## 2016-01-16 ENCOUNTER — Telehealth: Payer: Self-pay

## 2016-01-16 NOTE — Telephone Encounter (Signed)
Please ask her how she is feeling - physically and emotionally thanks

## 2016-01-16 NOTE — Telephone Encounter (Signed)
Linda Robinson [phone]   [name] seen Dr Darnell Level on 01/07/16 and he asked her to keep a listing of her blood pressure readings for several days, so she dropped them off. Placed in [employer] up front.

## 2016-01-16 NOTE — Telephone Encounter (Signed)
Reviewed BP's since Dr. Marliss Coots not in the office: 01/07/16: AM: 142/74 P: 62, PM: 143/57 P: 55 01/08/16: AM: 169/67 P: 55, PM: 174/71 P: 50   01/09/16: AM: 145/63 P: 53, PM: 146/58 P: 49 01/10/16: AM: 144/60 P: 44, PM: 161/61 P: 48 01/11/16: AM: 174/67 P: 54, PM: 164/63 P: 54 01/12/16: AM: 139/63 P: 46, PM: 147/60 P: 54 01/13/16: AM: 157/66 P: 52, PM: 156/63 P:50 01/14/16: AM: 189/76 P: 54 (before a meal),     Mid day: 139/59 P: 48 (1.5 hrs after a meal),       PM: 171/66 P: 49 01/15/16: AM: 136/68 P: 51, PM: 166/73 P: 53 01/16/16: AM: 138/63 P: 50, (no pm reading)  Letter with BP readings placed in Dr. Marliss Coots inbox

## 2016-01-17 MED ORDER — CLONIDINE HCL 0.1 MG PO TABS: 0.1000 mg | ORAL_TABLET | Freq: Two times a day (BID) | ORAL | Status: DC

## 2016-01-17 NOTE — Telephone Encounter (Signed)
Rx called in as prescribed to [facility]. Pt notified of Dr. Marliss Coots comments and verbalized understanding

## 2016-01-17 NOTE — Telephone Encounter (Signed)
I wrote a px for clonidine to call in .1mg  bid - update me if any side effects or problems  Hope this will start to decrease bp  F/u with me in May as planned Thanks

## 2016-01-17 NOTE — Telephone Encounter (Signed)
Pt said overall she is "feeling okay". Pt said her main issue is she is still weak from surgery, and changing position is causing her to be a little dizzy. Pt said she can't tell a difference in the increase dose of her sertraline but she said she is still doing okay. Pt wants to know what should she do about her BP readings. Pt advise I will send message to Dr. Glori Bickers, pt is okay with getting a call back on Monday since Dr. Marliss Coots out of the office today

## 2016-01-21 ENCOUNTER — Encounter: Payer: Self-pay | Admitting: Family Medicine

## 2016-01-21 ENCOUNTER — Other Ambulatory Visit: Payer: Self-pay | Admitting: Family Medicine

## 2016-01-24 ENCOUNTER — Telehealth: Payer: Self-pay

## 2016-01-24 MED ORDER — HYDROCHLOROTHIAZIDE 25 MG PO TABS: 25.0000 mg | ORAL_TABLET | Freq: Every day | ORAL | Status: DC

## 2016-01-24 NOTE — Telephone Encounter (Signed)
Stop the clonidine (I am worried about low heart rate with it) Stop lasix - it is not working as well for your HTN I want to try hctz (instead of the combo pill maxzide she used to take) and see if it is easier on the kidney (will watch this) A may 10 f/u is a good time frame to see how it is working and check labs  Please call in the hctz and take daily  F/u as planned (bring your bp cuff with you) Update if any problems

## 2016-01-24 NOTE — Telephone Encounter (Signed)
Pt stopped clonidine as instructed on 01/22/16; on 01/23/16 BP was 178/72 P 50; last night pt took clonidine 0.1 mg. Today BP is 153/66 P 46. No CP,SOB,H./A or dizziness now. Pt wants to know what to do and pt wants to know if should be seen before scheduled appt on 02/05/16. Pt request cb.

## 2016-01-24 NOTE — Telephone Encounter (Signed)
Pt notified of Dr. Marliss Coots comments an instructions she will stop the clonidine and the lasix and start the HCTZ, Rx sent to pharmacy and pt aware. Pt will keep f/u for 02/05/16 and will bring her BP cuff with her

## 2016-01-30 ENCOUNTER — Encounter: Payer: Self-pay | Admitting: Family Medicine

## 2016-02-05 ENCOUNTER — Encounter: Payer: Self-pay | Admitting: Family Medicine

## 2016-02-05 ENCOUNTER — Ambulatory Visit (INDEPENDENT_AMBULATORY_CARE_PROVIDER_SITE_OTHER): Payer: Medicare Other | Admitting: Family Medicine

## 2016-02-05 VITALS — BP 122/60 | HR 50 | Temp 98.6°F | Ht 61.0 in | Wt 157.8 lb

## 2016-02-05 DIAGNOSIS — I10 Essential (primary) hypertension: Secondary | ICD-10-CM

## 2016-02-05 DIAGNOSIS — N289 Disorder of kidney and ureter, unspecified: Secondary | ICD-10-CM

## 2016-02-05 DIAGNOSIS — F418 Other specified anxiety disorders: Secondary | ICD-10-CM | POA: Diagnosis not present

## 2016-02-05 LAB — BASIC METABOLIC PANEL
BUN: 22 mg/dL (ref 6–23)
CO2: 32 mEq/L (ref 19–32)
Calcium: 9.9 mg/dL (ref 8.4–10.5)
Chloride: 97 mEq/L (ref 96–112)
Creatinine, Ser: 1.01 mg/dL (ref 0.40–1.20)
GFR: 55.37 mL/min — AB (ref >60.00)
GLUCOSE: 98 mg/dL (ref 70–99)
POTASSIUM: 3.7 meq/L (ref 3.5–5.1)
Sodium: 139 mEq/L (ref 135–145)

## 2016-02-05 NOTE — Assessment & Plan Note (Signed)
Per pt - improved with inc of her sertraline to 75  Reviewed stressors/ coping techniques/symptoms/ support sources/ tx options and side effects in detail today  Stressor is her grandson-has to help him financially  Offered counseling- she does not think she can afford it but will check with ins re: coverage for mental health

## 2016-02-05 NOTE — Assessment & Plan Note (Addendum)
Pt has been higher at home than here  BP: 122/60 mmHg    (her cuff 133/61)  Her cuff may be inaccurate  Feels ok  Pulse is stable  Given option of getting a new cuff (OMRON) and following directions re: checking it (when relaxed) or stopping home monitoring (which does make her stressed) Disc low sodium diet  Disc role of stress  Will check lab today (with hctz) and plan further from there

## 2016-02-05 NOTE — Patient Instructions (Addendum)
Let's continue your current medicines  Your blood pressure cuff may run a bit high  You may want to think about getting a new one ( I like the brand called OMRON)  Or - don't check it at all (that is ok)-sometimes checking it frequently keeps you more stressed  Your pulse is stable Continue current medicines  Watch your sodium intake (processed foods)  Stay as active as you can  Continue your sertraline as directed and if you become interested in counseling let me know (call your insurance and see how they pay on counseling)  Take care of yourself  You can also call your insurance about the vaginal cream and ask if they cover another brand better   Lab today for your kidney function and electrolytes to make sure the HCTZ is ok

## 2016-02-05 NOTE — Progress Notes (Signed)
Subjective:    Patient ID: Linda Robinson, female    DOB: [birth year], 82 y.o.   MRN: [id number]  HPI Here for f/u of mood and HTN  Has had a hard time controlling bp at home  Tried clonidine low dose- caused a pulse that was too low  curretnly on amlodpine 10 and toprol xl 50 and changed her lasix to hctz 25  BP Readings from Last 3 Encounters:  02/05/16 112/58  01/07/16 158/70  11/27/15 145/71    Improved today  ? If cuff as accurate  Trying to check with arm at heart level while relaxed    Her most recent systolic bp at home  are [id number]  Diastolic 123456 to 0000000 Pulse rate in 50s to 60   Last visit with Dr Darnell Level also inc her sertraline  Think it has helped  A little less anxious  Stress- her grandson is having a hard time financially - very close to him  He is working but paying child support  Helps him financially (he has not been in any trouble)   Patient Active Problem List   Diagnosis Date Noted  . UTI (urinary tract infection) 10/22/2015  . Reaction to influenza immunization 08/09/2015  . Routine general medical examination at a health care facility 07/05/2015  . Estrogen deficiency 07/05/2015  . Hyperglycemia 01/01/2015  . Lumbar spinal stenosis 11/13/2014  . Vertigo 10/05/2014  . Elevated transaminase level 07/16/2014  . Leukocytosis 07/16/2014  . Risk for falls 06/27/2014  . Fall (on)(from) incline, initial encounter 09/15/2013  . Low back pain on right side with sciatica 07/24/2013  . Edema 04/26/2013  . Encounter for Medicare annual wellness exam 04/11/2013  . Colon cancer screening 02/10/2012  . BRADYCARDIA 05/19/2010  . Disorder of bone and cartilage 01/30/2009  . CONSTIPATION 11/01/2007  . Depression with anxiety 02/14/2007  . Hyperlipidemia LDL goal <130 02/04/2007  . Gout 02/04/2007  . Essential hypertension 02/04/2007  . DIVERTICULOSIS, COLON 02/04/2007  . Renal insufficiency 02/04/2007  . OSTEOARTHRITIS 02/04/2007  . [place] DISEASE 02/04/2007  . LEG CRAMPS 02/04/2007   Past Medical History  Diagnosis Date  . History of diverticulitis of colon   . Diverticulosis of colon   . Gout     hx of;was on Allopurinol but taken off 6 wks ago by medical MD  . HLD (hyperlipidemia)     takes Pravastatin daily  . Osteopenia     takes Calcium and Vit D daily  . GERD (gastroesophageal reflux disease)     atypical chest pain  . Basal cell carcinoma     removed  . HTN (hypertension)     takes Amlodipine and Metoprolol daily  . Vertigo     takes Antivert daily as needed  . Constipation     takes Colace daily  . Depression     takes Zoloft daily  . OA (osteoarthritis)     right knee  . Chronic back pain     stenosis   Past Surgical History  Procedure Laterality Date  . Cataract extraction Right 04/2006  . Cholecystectomy    . Partial colectomy  09/1999    d/t diverticulitis  . Neck surgery      x 2;fusion  . Knee surgery Right     Medial meniscus tear  . Back surgery    . Tubal ligation    . Appendectomy  at age 63  . Colonoscopy    . Lumbar laminectomy/decompression microdiscectomy Bilateral 11/13/2014  Procedure: Lumbar one-two Bilateral laminectomy and foraminotomy, Left Lumbar one-two microdiscectomy ;  Surgeon: Faythe Ghee, MD;  Location: [facility];  Service: Neurosurgery;  Laterality: Bilateral;   Social History  Substance Use Topics  . Smoking status: Never Smoker   . Smokeless tobacco: None  . Alcohol Use: No   Family History  Problem Relation Age of Onset  . Cancer Father     throat and bladder  . Diabetes Father   . Hypertension Father   . Heart attack Father   . Stroke Mother   . Alzheimer's disease Mother   . Hypertension Mother    Allergies  Allergen Reactions  . Ace Inhibitors     REACTION: increased K+, increased creat.  . Cephalosporins     REACTION: reaction not known  . Colesevelam     REACTION: constipation  . Lovastatin     REACTION: doesn't work  .  Raloxifene     REACTION: cramps  . Rosuvastatin     REACTION: myalgia  . Simvastatin     REACTION: myalgia   Current Outpatient Prescriptions on File Prior to Visit  Medication Sig Dispense Refill  . amLODipine (NORVASC) 10 MG tablet Take 1 tablet (10 mg total) by mouth daily. 90 tablet 3  . Calcium Carbonate-Vitamin D (CALCIUM 500 + D) 500-125 MG-UNIT TABS Take by mouth.    . Cranberry 250 MG CAPS Take 1 capsule by mouth daily.      Marland Kitchen docusate sodium (COLACE) 100 MG capsule Take 200 mg by mouth daily.      Marland Kitchen estradiol (ESTRACE) 0.1 MG/GM vaginal cream Vaginal Estrogen Cream apply a blueberry sized amount to vaginal opening using finger tip 3 times weekly before bed. 42.5 g 12  . fish oil-omega-3 fatty acids 1000 MG capsule Take 1 g by mouth 2 (two) times daily.     Marland Kitchen gabapentin (NEURONTIN) 300 MG capsule Take 1 capsule by mouth 3 (three) times daily.    . hydrochlorothiazide (HYDRODIURIL) 25 MG tablet Take 1 tablet (25 mg total) by mouth daily. 30 tablet 11  . meclizine (ANTIVERT) 25 MG tablet Take 25 mg by mouth 4 (four) times daily as needed for dizziness.     . metoprolol succinate (TOPROL-XL) 50 MG 24 hr tablet Take 1 tablet (50 mg total) by mouth daily. Take with or immediately following a meal. 180 tablet 1  . polycarbophil (FIBERCON) 625 MG tablet Take 625 mg by mouth daily as needed for diarrhea or loose stools.     . pravastatin (PRAVACHOL) 20 MG tablet TAKE ONE TABLET TO ONE TABLET DAILY 90 tablet 1  . sertraline (ZOLOFT) 50 MG tablet Take 1.5 tablets (75 mg total) by mouth daily. 135 tablet 1   No current facility-administered medications on file prior to visit.    Review of Systems Review of Systems  Constitutional: Negative for fever, appetite change,  and unexpected weight change.  Eyes: Negative for pain and visual disturbance.  Respiratory: Negative for cough and shortness of breath.   Cardiovascular: Negative for cp or palpitations    Gastrointestinal: Negative for  nausea, diarrhea and constipation.  Genitourinary: Negative for urgency and frequency.  Skin: Negative for pallor or rash   Neurological: Negative for weakness, light-headedness, numbness and headaches.  Hematological: Negative for adenopathy. Does not bruise/bleed easily.  Psychiatric/Behavioral: pos for anxiety/stress reaction over issues with family- this has improved with inc sertraline, denies SI.         Objective:   Physical Exam  Constitutional: She appears well-developed and well-nourished. No distress.  Well appearing elderly female  HENT:  Head: Normocephalic and atraumatic.  Mouth/Throat: Oropharynx is clear and moist.  Eyes: Conjunctivae and EOM are normal. Pupils are equal, round, and reactive to light.  Neck: Normal range of motion. Neck supple. No JVD present. Carotid bruit is not present. No thyromegaly present.  Cardiovascular: Regular rhythm, normal heart sounds and intact distal pulses.  Exam reveals no gallop.   bp on her cuff was 10 pt high systolic Pulse in [id number]  Pulmonary/Chest: Effort normal and breath sounds normal. No respiratory distress. She has no wheezes. She has no rales.  No crackles  Abdominal: Soft. Bowel sounds are normal. She exhibits no distension, no abdominal bruit and no mass. There is no tenderness.  Musculoskeletal: She exhibits no edema.  Lymphadenopathy:    She has no cervical adenopathy.  Neurological: She is alert. She has normal reflexes. No cranial nerve deficit. She exhibits normal muscle tone. Coordination normal.  Skin: Skin is warm and dry. No rash noted.  Psychiatric: She has a normal mood and affect.          Assessment & Plan:   Problem List Items Addressed This Visit      Cardiovascular and Mediastinum   Essential hypertension - Primary (Chronic)    Pt has been higher at home than here  BP: 122/60 mmHg    (her cuff 133/61)  Her cuff may be inaccurate  Feels ok  Pulse is stable  Given option of getting a new cuff  (OMRON) and following directions re: checking it (when relaxed) or stopping home monitoring (which does make her stressed) Disc low sodium diet  Disc role of stress  Will check lab today (with hctz) and plan further from there         Relevant Orders   Basic metabolic panel     Genitourinary   Renal insufficiency    We changed lasix to hctz for better bp control  Check bmp today and see if this is tolerated Reminded to get a good fluid intake and avoid excess sodium  If we can stay with hctz -will need a mail order px        Other   Depression with anxiety    Per pt - improved with inc of her sertraline to 75  Reviewed stressors/ coping techniques/symptoms/ support sources/ tx options and side effects in detail today  Stressor is her grandson-has to help him financially  Offered counseling- she does not think she can afford it but will check with ins re: coverage for mental health

## 2016-02-05 NOTE — Assessment & Plan Note (Signed)
We changed lasix to hctz for better bp control  Check bmp today and see if this is tolerated Reminded to get a good fluid intake and avoid excess sodium  If we can stay with hctz -will need a mail order px

## 2016-02-05 NOTE — Progress Notes (Signed)
Pre visit review using our clinic review tool, if applicable. No additional management support is needed unless otherwise documented below in the visit note. 

## 2016-02-11 ENCOUNTER — Other Ambulatory Visit: Payer: Self-pay | Admitting: *Deleted

## 2016-02-11 MED ORDER — HYDROCHLOROTHIAZIDE 25 MG PO TABS: 25.0000 mg | ORAL_TABLET | Freq: Every day | ORAL | Status: DC

## 2016-02-11 NOTE — Telephone Encounter (Signed)
Needs Rx sent to mail order pharmacy, done

## 2016-03-17 ENCOUNTER — Other Ambulatory Visit: Payer: Self-pay | Admitting: *Deleted

## 2016-03-17 MED ORDER — PRAVASTATIN SODIUM 20 MG PO TABS: ORAL_TABLET | ORAL | Status: DC

## 2016-04-02 ENCOUNTER — Telehealth: Payer: Self-pay | Admitting: Family Medicine

## 2016-04-02 NOTE — Telephone Encounter (Signed)
I spoke with pt and she scheduled appt with Dr Silvio Pate 04/03/16 at 12 noon. If pt condition changes or worsens prior to appt pt will cb or go to UC.

## 2016-04-02 NOTE — Telephone Encounter (Signed)
Okay--I will check her tomorrow 

## 2016-04-02 NOTE — Telephone Encounter (Signed)
Patient Name: Linda Robinson  DOB: 28-Jan-1929    Initial Comment Caller has been feeling light headed and weak since last week. wants BP checked, was put on 3rd BP pill    Nurse Assessment  Nurse: Raphael Gibney, RN, Vanita Ingles Date/Time (Eastern Time): 04/02/2016 2:15:03 PM  Confirm and document reason for call. If symptomatic, describe symptoms. You must click the next button to save text entered. ---Caller states she has been light headed and weak since last week. She has intermittent dizziness. Her balance is off. she is on 3 different BP medications.  Has the patient traveled out of the country within the last 30 days? ---Not Applicable  Does the patient have any new or worsening symptoms? ---Yes  Will a triage be completed? ---Yes  Related visit to physician within the last 2 weeks? ---No  Does the PT have any chronic conditions? (i.e. diabetes, asthma, etc.) ---Yes  List chronic conditions. ---back surgery; HTN  Is this a behavioral health or substance abuse call? ---No     Guidelines    Guideline Title Affirmed Question Affirmed Notes  Dizziness - Lightheadedness Taking a medicine that could cause dizziness (e.g., blood pressure medications, diuretics)    Final Disposition User   See Physician within 24 Hours Stringer, RN, Vera    Comments  No appts availabel within 24 hrs to see Dr. Glori Bickers. Pt does not want to see another doctor. She wants to know about getting an appt for Monday. Please call pt back regarding appt.   Referrals  REFERRED TO PCP OFFICE   Disagree/Comply: Comply

## 2016-04-03 ENCOUNTER — Encounter: Payer: Self-pay | Admitting: Internal Medicine

## 2016-04-03 ENCOUNTER — Ambulatory Visit (INDEPENDENT_AMBULATORY_CARE_PROVIDER_SITE_OTHER): Payer: Medicare Other | Admitting: Internal Medicine

## 2016-04-03 VITALS — BP 130/70 | HR 43 | Temp 97.8°F | Wt 161.0 lb

## 2016-04-03 DIAGNOSIS — I10 Essential (primary) hypertension: Secondary | ICD-10-CM | POA: Diagnosis not present

## 2016-04-03 NOTE — Assessment & Plan Note (Signed)
Serious orthostatic AM symptoms on the HCTZ Better off it now BP Readings from Last 3 Encounters:  04/03/16 130/70  02/05/16 122/60  01/07/16 158/70   Repeat on right 152/68 Her pulse is slow Might benefit from the HCTZ--but probably would have to decrease the metoprolol Will leave this to Dr Darene Lamer

## 2016-04-03 NOTE — Progress Notes (Signed)
Subjective:    Patient ID: Linda Robinson, female    DOB: 05-May-1929, 82 y.o.   MRN: [id number]  HPI Here due to concerns about her BP  Very lightheaded and weak in AM Stopped the HCTZ herself 4 days ago BP had been up with stress before---but seems to be better (stress is better) Dizziness is better No chest pain No palpitations No edema  Ongoing back pain--no real change  Current Outpatient Prescriptions on File Prior to Visit  Medication Sig Dispense Refill  . amLODipine (NORVASC) 10 MG tablet Take 1 tablet (10 mg total) by mouth daily. 90 tablet 3  . Calcium Carbonate-Vitamin D (CALCIUM 500 + D) 500-125 MG-UNIT TABS Take by mouth.    . Cranberry 250 MG CAPS Take 1 capsule by mouth daily.      Marland Kitchen docusate sodium (COLACE) 100 MG capsule Take 200 mg by mouth daily.      Marland Kitchen estradiol (ESTRACE) 0.1 MG/GM vaginal cream Vaginal Estrogen Cream apply a blueberry sized amount to vaginal opening using finger tip 3 times weekly before bed. 42.5 g 12  . fish oil-omega-3 fatty acids 1000 MG capsule Take 1 g by mouth 2 (two) times daily.     Marland Kitchen gabapentin (NEURONTIN) 300 MG capsule Take 1 capsule by mouth 3 (three) times daily.    . meclizine (ANTIVERT) 25 MG tablet Take 25 mg by mouth 4 (four) times daily as needed for dizziness.     . metoprolol succinate (TOPROL-XL) 50 MG 24 hr tablet Take 1 tablet (50 mg total) by mouth daily. Take with or immediately following a meal. 180 tablet 1  . polycarbophil (FIBERCON) 625 MG tablet Take 625 mg by mouth daily as needed for diarrhea or loose stools.     . pravastatin (PRAVACHOL) 20 MG tablet TAKE ONE TABLET TO ONE TABLET DAILY 90 tablet 1  . sertraline (ZOLOFT) 50 MG tablet Take 1.5 tablets (75 mg total) by mouth daily. 135 tablet 1  . hydrochlorothiazide (HYDRODIURIL) 25 MG tablet Take 1 tablet (25 mg total) by mouth daily. (Patient not taking: Reported on 04/03/2016) 90 tablet 2   No current facility-administered medications on file prior to visit.     Allergies  Allergen Reactions  . Ace Inhibitors     REACTION: increased K+, increased creat.  . Cephalosporins     REACTION: reaction not known  . Colesevelam     REACTION: constipation  . Lovastatin     REACTION: doesn't work  . Raloxifene     REACTION: cramps  . Rosuvastatin     REACTION: myalgia  . Simvastatin     REACTION: myalgia    Past Medical History  Diagnosis Date  . History of diverticulitis of colon   . Diverticulosis of colon   . Gout     hx of;was on Allopurinol but taken off 6 wks ago by medical MD  . HLD (hyperlipidemia)     takes Pravastatin daily  . Osteopenia     takes Calcium and Vit D daily  . GERD (gastroesophageal reflux disease)     atypical chest pain  . Basal cell carcinoma     removed  . HTN (hypertension)     takes Amlodipine and Metoprolol daily  . Vertigo     takes Antivert daily as needed  . Constipation     takes Colace daily  . Depression     takes Zoloft daily  . OA (osteoarthritis)     right knee  .  Chronic back pain     stenosis    Past Surgical History  Procedure Laterality Date  . Cataract extraction Right 04/2006  . Cholecystectomy    . Partial colectomy  09/1999    d/t diverticulitis  . Neck surgery      x 2;fusion  . Knee surgery Right     Medial meniscus tear  . Back surgery    . Tubal ligation    . Appendectomy  at age 7  . Colonoscopy    . Lumbar laminectomy/decompression microdiscectomy Bilateral 11/13/2014    Procedure: Lumbar one-two Bilateral laminectomy and foraminotomy, Left Lumbar one-two microdiscectomy ;  Surgeon: Faythe Ghee, MD;  Location: [place] [facility];  Service: Neurosurgery;  Laterality: Bilateral;    Family History  Problem Relation Age of Onset  . Cancer Father     throat and bladder  . Diabetes Father   . Hypertension Father   . Heart attack Father   . Stroke Mother   . Alzheimer's disease Mother   . Hypertension Mother     Social History   Social History  . Marital  Status: Married    Spouse Name: N/A  . Number of Children: N/A  . Years of Education: N/A   Occupational History  . Retired    Social History Main Topics  . Smoking status: Never Smoker   . Smokeless tobacco: Not on file  . Alcohol Use: No  . Drug Use: No  . Sexual Activity: Not Currently    Birth Control/ Protection: Post-menopausal   Other Topics Concern  . Not on file   Social History Narrative   Review of Systems  Occasional mild headache Not taking her BP at home now--plans to get new Rx Appetite is fine     Objective:   Physical Exam  Constitutional: She appears well-developed and well-nourished. No distress.  Neck: Normal range of motion. No thyromegaly present.  Cardiovascular: Regular rhythm and normal heart sounds.  Exam reveals no gallop.   No murmur heard. bradycardic  Pulmonary/Chest: Effort normal and breath sounds normal. No respiratory distress. She has no wheezes. She has no rales.  Lymphadenopathy:    She has no cervical adenopathy.          Assessment & Plan:

## 2016-04-03 NOTE — Progress Notes (Signed)
Pre visit review using our clinic review tool, if applicable. No additional management support is needed unless otherwise documented below in the visit note. 

## 2016-04-03 NOTE — Patient Instructions (Signed)
Stay off the HCTZ for now. Let Dr Glori Bickers know if you have more dizziness when you stand up (or fatigue).

## 2016-05-11 ENCOUNTER — Ambulatory Visit (INDEPENDENT_AMBULATORY_CARE_PROVIDER_SITE_OTHER): Payer: Medicare Other | Admitting: Family Medicine

## 2016-05-11 ENCOUNTER — Encounter: Payer: Self-pay | Admitting: Family Medicine

## 2016-05-11 VITALS — BP 158/70 | HR 47 | Temp 98.3°F | Ht 61.0 in | Wt 162.2 lb

## 2016-05-11 DIAGNOSIS — R001 Bradycardia, unspecified: Secondary | ICD-10-CM

## 2016-05-11 DIAGNOSIS — I10 Essential (primary) hypertension: Secondary | ICD-10-CM

## 2016-05-11 NOTE — Assessment & Plan Note (Signed)
bp is up again off of hctz (and some self rep edema) Also pulse is low Will hold metoprolol and re start hctz 25 Update if low bp or side eff F/u fall as planned Disc DASH eating plan

## 2016-05-11 NOTE — Assessment & Plan Note (Signed)
Dc metopolol- pulse in 40s Some fatigue

## 2016-05-11 NOTE — Progress Notes (Signed)
Pre visit review using our clinic review tool, if applicable. No additional management support is needed unless otherwise documented below in the visit note. 

## 2016-05-11 NOTE — Progress Notes (Signed)
Subjective:    Patient ID: Linda Robinson, female    DOB: October 11, 1928, 82 y.o.   MRN: [id number]  HPI Here for f/u of BP  Pt saw Dr Silvio Pate last month with orthostatic hypotension She held the HCTZ  Of note-she started taking a fluid pill every other day   Wt Readings from Last 3 Encounters:  05/11/16 162 lb 4 oz (73.6 kg)  04/03/16 161 lb (73 kg)  02/05/16 157 lb 12 oz (71.6 kg)   bmi is 30.6  bp is stable today  No cp or palpitations or headaches or edema  No side effects to medicines  BP Readings from Last 3 Encounters:  05/11/16 (!) 158/70  04/03/16 130/70  02/05/16 122/60     She is feeling tired in general  Has to rest after doing things - due to back pain   She never did get a new bp cuff  Seldom a headache Is swelling a bit more in her ankles   Pulse is low at 47 Pulse Readings from Last 3 Encounters:  05/11/16 (!) 47  04/03/16 (!) 43  02/05/16 (!) 50    Also wanted to know if she could stop the estrogen cream It seems to be irritating her  No itching or vag d/c    Patient Active Problem List   Diagnosis Date Noted  . Reaction to influenza immunization 08/09/2015  . Routine general medical examination at a health care facility 07/05/2015  . Estrogen deficiency 07/05/2015  . Hyperglycemia 01/01/2015  . Lumbar spinal stenosis 11/13/2014  . Vertigo 10/05/2014  . Elevated transaminase level 07/16/2014  . Leukocytosis 07/16/2014  . Risk for falls 06/27/2014  . Fall (on)(from) incline, initial encounter 09/15/2013  . Low back pain on right side with sciatica 07/24/2013  . Edema 04/26/2013  . Encounter for Medicare annual wellness exam 04/11/2013  . Colon cancer screening 02/10/2012  . Bradycardia 05/19/2010  . Disorder of bone and cartilage 01/30/2009  . CONSTIPATION 11/01/2007  . Depression with anxiety 02/14/2007  . Hyperlipidemia LDL goal <130 02/04/2007  . Gout 02/04/2007  . Essential hypertension 02/04/2007  . DIVERTICULOSIS, COLON  02/04/2007  . Renal insufficiency 02/04/2007  . OSTEOARTHRITIS 02/04/2007  . [place] DISEASE 02/04/2007  . LEG CRAMPS 02/04/2007   Past Medical History:  Diagnosis Date  . Basal cell carcinoma    removed  . Chronic back pain    stenosis  . Constipation    takes Colace daily  . Depression    takes Zoloft daily  . Diverticulosis of colon   . GERD (gastroesophageal reflux disease)    atypical chest pain  . Gout    hx of;was on Allopurinol but taken off 6 wks ago by medical MD  . History of diverticulitis of colon   . HLD (hyperlipidemia)    takes Pravastatin daily  . HTN (hypertension)    takes Amlodipine and Metoprolol daily  . OA (osteoarthritis)    right knee  . Osteopenia    takes Calcium and Vit D daily  . Vertigo    takes Antivert daily as needed   Past Surgical History:  Procedure Laterality Date  . APPENDECTOMY  at age 74  . BACK SURGERY    . CATARACT EXTRACTION Right 04/2006  . CHOLECYSTECTOMY    . COLONOSCOPY    . KNEE SURGERY Right    Medial meniscus tear  . LUMBAR LAMINECTOMY/DECOMPRESSION MICRODISCECTOMY Bilateral 11/13/2014   Procedure: Lumbar one-two Bilateral laminectomy and foraminotomy, Left Lumbar one-two  microdiscectomy ;  Surgeon: Faythe Ghee, MD;  Location: [place] [facility];  Service: Neurosurgery;  Laterality: Bilateral;  . NECK SURGERY     x 2;fusion  . PARTIAL COLECTOMY  09/1999   d/t diverticulitis  . TUBAL LIGATION     Social History  Substance Use Topics  . Smoking status: Never Smoker  . Smokeless tobacco: Never Used  . Alcohol use No   Family History  Problem Relation Age of Onset  . Cancer Father     throat and bladder  . Diabetes Father   . Hypertension Father   . Heart attack Father   . Stroke Mother   . Alzheimer's disease Mother   . Hypertension Mother    Allergies  Allergen Reactions  . Ace Inhibitors     REACTION: increased K+, increased creat.  . Cephalosporins     REACTION: reaction not known  .  Colesevelam     REACTION: constipation  . Lovastatin     REACTION: doesn't work  . Raloxifene     REACTION: cramps  . Rosuvastatin     REACTION: myalgia  . Simvastatin     REACTION: myalgia   Current Outpatient Prescriptions on File Prior to Visit  Medication Sig Dispense Refill  . amLODipine (NORVASC) 10 MG tablet Take 1 tablet (10 mg total) by mouth daily. 90 tablet 3  . Calcium Carbonate-Vitamin D (CALCIUM 500 + D) 500-125 MG-UNIT TABS Take by mouth.    . Cranberry 250 MG CAPS Take 1 capsule by mouth daily.      Marland Kitchen docusate sodium (COLACE) 100 MG capsule Take 200 mg by mouth daily.      . fish oil-omega-3 fatty acids 1000 MG capsule Take 1 g by mouth 2 (two) times daily.     Marland Kitchen gabapentin (NEURONTIN) 300 MG capsule Take 1 capsule by mouth 3 (three) times daily.    . meclizine (ANTIVERT) 25 MG tablet Take 25 mg by mouth 4 (four) times daily as needed for dizziness.     . polycarbophil (FIBERCON) 625 MG tablet Take 625 mg by mouth daily as needed for diarrhea or loose stools.     . pravastatin (PRAVACHOL) 20 MG tablet TAKE ONE TABLET TO ONE TABLET DAILY 90 tablet 1  . sertraline (ZOLOFT) 50 MG tablet Take 1.5 tablets (75 mg total) by mouth daily. 135 tablet 1   No current facility-administered medications on file prior to visit.     Review of Systems Review of Systems  Constitutional: Negative for fever, appetite change, fatigue and unexpected weight change.  Eyes: Negative for pain and visual disturbance.  Respiratory: Negative for cough and shortness of breath.   Cardiovascular: Negative for cp or palpitations    Gastrointestinal: Negative for nausea, diarrhea and constipation.  Genitourinary: Negative for urgency and frequency. pos for irritation from estrogen cream  Skin: Negative for pallor or rash   Neurological: Negative for weakness, light-headedness, numbness and headaches.  Hematological: Negative for adenopathy. Does not bruise/bleed easily.  Psychiatric/Behavioral:  Negative for dysphoric mood. The patient is not nervous/anxious.         Objective:   Physical Exam  Constitutional: She appears well-developed and well-nourished. No distress.  obese and well appearing   HENT:  Head: Normocephalic and atraumatic.  Mouth/Throat: Oropharynx is clear and moist.  Eyes: Conjunctivae and EOM are normal. Pupils are equal, round, and reactive to light.  Neck: Normal range of motion. Neck supple. No JVD present. Carotid bruit is not present. No thyromegaly  present.  Cardiovascular: Normal rate, regular rhythm, normal heart sounds and intact distal pulses.  Exam reveals no gallop.   Pulmonary/Chest: Effort normal and breath sounds normal. No respiratory distress. She has no wheezes. She has no rales.  No crackles  Abdominal: Soft. Bowel sounds are normal. She exhibits no distension, no abdominal bruit and no mass. There is no tenderness.  Musculoskeletal: She exhibits no edema.  No pitting edema  Lymphadenopathy:    She has no cervical adenopathy.  Neurological: She is alert. She has normal reflexes.  Skin: Skin is warm and dry. No rash noted.  Psychiatric: She has a normal mood and affect.          Assessment & Plan:   Problem List Items Addressed This Visit      Cardiovascular and Mediastinum   Essential hypertension - Primary (Chronic)    bp is up again off of hctz (and some self rep edema) Also pulse is low Will hold metoprolol and re start hctz 25 Update if low bp or side eff F/u fall as planned Disc DASH eating plan      Relevant Medications   hydrochlorothiazide (HYDRODIURIL) 25 MG tablet     Other   Bradycardia    Dc metopolol- pulse in 40s Some fatigue       Other Visit Diagnoses   None.

## 2016-05-11 NOTE — Patient Instructions (Signed)
Stop your metopolol -it is lowering your pulse too much Go back on your HCTZ 25 mg once daily for fluid and blood pressure Do not take the furosemide  If you have any more episodes of low blood pressure let me know  Stop the estrogen cream also  Avoid sodium in your diet  I will plan to see you in Oct as planned Stop up front for your lab and nurse appt the day before that

## 2016-05-18 ENCOUNTER — Telehealth: Payer: Self-pay | Admitting: Family Medicine

## 2016-05-18 NOTE — Telephone Encounter (Signed)
Will evaluate then 

## 2016-05-18 NOTE — Telephone Encounter (Signed)
Pt has appt 05/19/16 at 9:45 with Dr Silvio Pate.

## 2016-05-18 NOTE — Telephone Encounter (Signed)
[facility]  Patient Name: Linda Robinson  DOB: December 30, 1928    Initial Comment Caller states that she is having weakness and dizzy spells possibly due to her medication   Nurse Assessment  Nurse: Harlow Mares, RN, Suanne Marker Date/Time (Eastern Time): 05/18/2016 2:01:05 PM  Confirm and document reason for call. If symptomatic, describe symptoms. You must click the next button to save text entered. ---Caller states that she is having weakness and dizzy spells possibly due to her medication. Began yesterday. No falls. MD changed her medication last Tuesday and she began last Wed. Reports that this is a fluid bp med. She has taken metoprolol for years.  Has the patient traveled out of the country within the last 30 days? ---Not Applicable  Does the patient have any new or worsening symptoms? ---Yes  Will a triage be completed? ---Yes  Related visit to physician within the last 2 weeks? ---Yes  Does the PT have any chronic conditions? (i.e. diabetes, asthma, etc.) ---Yes  List chronic conditions. ---HTN, high chol  Is this a behavioral health or substance abuse call? ---No     Guidelines    Guideline Title Affirmed Question Affirmed Notes  Dizziness - Lightheadedness Taking a medicine that could cause dizziness (e.g., blood pressure medications, diuretics)    Final Disposition User   See Physician within 24 Hours [place], RN, Suanne Marker    Comments  New medication MD placed her on is hydrochlorathiazide 25 mg daily.  Appt scheduled for 05/19/16 @ 9:45am with Dr. Viviana Simpler at the [facility] location.   Referrals  REFERRED TO PCP OFFICE   Disagree/Comply: Comply

## 2016-05-19 ENCOUNTER — Encounter: Payer: Self-pay | Admitting: Internal Medicine

## 2016-05-19 ENCOUNTER — Ambulatory Visit (INDEPENDENT_AMBULATORY_CARE_PROVIDER_SITE_OTHER): Payer: Medicare Other | Admitting: Internal Medicine

## 2016-05-19 DIAGNOSIS — R42 Dizziness and giddiness: Secondary | ICD-10-CM | POA: Insufficient documentation

## 2016-05-19 NOTE — Progress Notes (Signed)
Pre visit review using our clinic review tool, if applicable. No additional management support is needed unless otherwise documented below in the visit note. 

## 2016-05-19 NOTE — Assessment & Plan Note (Signed)
Clearly seems to be intolerant of the HCTZ  BP Readings from Last 3 Encounters:  05/19/16 128/84  05/11/16 (!) 158/70  04/03/16 130/70   No problems with the metoprolol despite bradycardia Will stop the HCTZ Restart metoprolol--consider decrease to 25 (but has had palpitations since off so will use 50) May need to accept systolic at [id number] or so (or consider other Rx-- ACEI or ARB)

## 2016-05-19 NOTE — Patient Instructions (Signed)
Stop the HCTZ and restart the metoprolol.  Let Dr Glori Bickers know if you have any ongoing symptoms---swelling, dizziness, palpitations.

## 2016-05-19 NOTE — Progress Notes (Signed)
Subjective:    Patient ID: Linda Robinson, female    DOB: 21-Apr-1929, 82 y.o.   MRN: [id number]  HPI Here for dizziness Reviewed past note by Dr Glori Bickers  Restarted HCTZ a week ago Mild dizziness 3 days ago Last 3 days it was very bad---couldn't even walk around the house (mostly stayed in bed) Has stayed in bed through that time mostly Has continued on the med  Still off the metoprolol HR has been in 40's for a long time--no symptoms from that Some headache No chest pain Occasional pounding of heart--seems to be fast  Current Outpatient Prescriptions on File Prior to Visit  Medication Sig Dispense Refill  . amLODipine (NORVASC) 10 MG tablet Take 1 tablet (10 mg total) by mouth daily. 90 tablet 3  . Calcium Carbonate-Vitamin D (CALCIUM 500 + D) 500-125 MG-UNIT TABS Take by mouth.    . Cranberry 250 MG CAPS Take 1 capsule by mouth daily.      Marland Kitchen docusate sodium (COLACE) 100 MG capsule Take 200 mg by mouth daily.      . fish oil-omega-3 fatty acids 1000 MG capsule Take 1 g by mouth 2 (two) times daily.     Marland Kitchen gabapentin (NEURONTIN) 300 MG capsule Take 1 capsule by mouth 3 (three) times daily.    . hydrochlorothiazide (HYDRODIURIL) 25 MG tablet Take 25 mg by mouth daily.    . meclizine (ANTIVERT) 25 MG tablet Take 25 mg by mouth 4 (four) times daily as needed for dizziness.     . polycarbophil (FIBERCON) 625 MG tablet Take 625 mg by mouth daily as needed for diarrhea or loose stools.     . pravastatin (PRAVACHOL) 20 MG tablet TAKE ONE TABLET TO ONE TABLET DAILY 90 tablet 1  . sertraline (ZOLOFT) 50 MG tablet Take 1.5 tablets (75 mg total) by mouth daily. 135 tablet 1   No current facility-administered medications on file prior to visit.     Allergies  Allergen Reactions  . Ace Inhibitors     REACTION: increased K+, increased creat.  . Cephalosporins     REACTION: reaction not known  . Colesevelam     REACTION: constipation  . Lovastatin     REACTION: doesn't work  .  Raloxifene     REACTION: cramps  . Rosuvastatin     REACTION: myalgia  . Simvastatin     REACTION: myalgia    Past Medical History:  Diagnosis Date  . Basal cell carcinoma    removed  . Chronic back pain    stenosis  . Constipation    takes Colace daily  . Depression    takes Zoloft daily  . Diverticulosis of colon   . GERD (gastroesophageal reflux disease)    atypical chest pain  . Gout    hx of;was on Allopurinol but taken off 6 wks ago by medical MD  . History of diverticulitis of colon   . HLD (hyperlipidemia)    takes Pravastatin daily  . HTN (hypertension)    takes Amlodipine and Metoprolol daily  . OA (osteoarthritis)    right knee  . Osteopenia    takes Calcium and Vit D daily  . Vertigo    takes Antivert daily as needed    Past Surgical History:  Procedure Laterality Date  . APPENDECTOMY  at age 43  . BACK SURGERY    . CATARACT EXTRACTION Right 04/2006  . CHOLECYSTECTOMY    . COLONOSCOPY    . KNEE SURGERY Right  Medial meniscus tear  . LUMBAR LAMINECTOMY/DECOMPRESSION MICRODISCECTOMY Bilateral 11/13/2014   Procedure: Lumbar one-two Bilateral laminectomy and foraminotomy, Left Lumbar one-two microdiscectomy ;  Surgeon: Faythe Ghee, MD;  Location: [place] [facility];  Service: Neurosurgery;  Laterality: Bilateral;  . NECK SURGERY     x 2;fusion  . PARTIAL COLECTOMY  09/1999   d/t diverticulitis  . TUBAL LIGATION      Family History  Problem Relation Age of Onset  . Cancer Father     throat and bladder  . Diabetes Father   . Hypertension Father   . Heart attack Father   . Stroke Mother   . Alzheimer's disease Mother   . Hypertension Mother     Social History   Social History  . Marital status: Married    Spouse name: N/A  . Number of children: N/A  . Years of education: N/A   Occupational History  . Retired    Social History Main Topics  . Smoking status: Never Smoker  . Smokeless tobacco: Never Used  . Alcohol use No  . Drug use: No   . Sexual activity: Not Currently    Birth control/ protection: Post-menopausal   Other Topics Concern  . Not on file   Social History Narrative  . No narrative on file   Review of Systems Appetite is okay Sleeps okay---after some trouble initiating    Objective:   Physical Exam  Constitutional: She appears well-developed and well-nourished. No distress.  Neck: Normal range of motion. Neck supple.  Cardiovascular: Normal rate, regular rhythm and normal heart sounds.  Exam reveals no gallop.   No murmur heard. HR ~80  Pulmonary/Chest: Effort normal and breath sounds normal. No respiratory distress. She has no wheezes. She has no rales.  Musculoskeletal: She exhibits no edema.  Lymphadenopathy:    She has no cervical adenopathy.          Assessment & Plan:

## 2016-06-17 ENCOUNTER — Other Ambulatory Visit: Payer: Self-pay | Admitting: Family Medicine

## 2016-07-05 ENCOUNTER — Telehealth: Payer: Self-pay | Admitting: Family Medicine

## 2016-07-05 DIAGNOSIS — Z0000 Encounter for general adult medical examination without abnormal findings: Secondary | ICD-10-CM

## 2016-07-05 DIAGNOSIS — I10 Essential (primary) hypertension: Secondary | ICD-10-CM

## 2016-07-05 DIAGNOSIS — R739 Hyperglycemia, unspecified: Secondary | ICD-10-CM

## 2016-07-05 NOTE — Telephone Encounter (Signed)
-----   Message from Eustace Pen, LPN sent at [id number]  3:32 PM EDT ----- Regarding: Lab Orders for 10/10 Please place lab orders. Thank you.

## 2016-07-06 ENCOUNTER — Encounter: Payer: Medicare Other | Admitting: Family Medicine

## 2016-07-06 ENCOUNTER — Other Ambulatory Visit: Payer: Medicare Other

## 2016-07-06 ENCOUNTER — Ambulatory Visit: Payer: Medicare Other

## 2016-07-07 ENCOUNTER — Ambulatory Visit (INDEPENDENT_AMBULATORY_CARE_PROVIDER_SITE_OTHER): Payer: Medicare Other

## 2016-07-07 ENCOUNTER — Other Ambulatory Visit: Payer: Medicare Other

## 2016-07-07 VITALS — BP 126/64 | HR 51 | Temp 98.6°F | Ht 62.0 in | Wt 159.2 lb

## 2016-07-07 DIAGNOSIS — R739 Hyperglycemia, unspecified: Secondary | ICD-10-CM | POA: Diagnosis not present

## 2016-07-07 DIAGNOSIS — Z0000 Encounter for general adult medical examination without abnormal findings: Secondary | ICD-10-CM

## 2016-07-07 LAB — LIPID PANEL
Cholesterol: 219 mg/dL — ABNORMAL HIGH (ref 0–200)
HDL: 58.9 mg/dL (ref >39.00)
LDL Cholesterol: 139 mg/dL — ABNORMAL HIGH (ref 0–99)
NonHDL: 160.57
TRIGLYCERIDES: 109 mg/dL (ref 0.0–149.0)
Total CHOL/HDL Ratio: 4
VLDL: 21.8 mg/dL (ref 0.0–40.0)

## 2016-07-07 LAB — CBC WITH DIFFERENTIAL/PLATELET
BASOS PCT: 0 % (ref 0.0–3.0)
Basophils Absolute: 0 10*3/uL (ref 0.0–0.1)
EOS PCT: 1.2 % (ref 0.0–5.0)
Eosinophils Absolute: 0.1 10*3/uL (ref 0.0–0.7)
HEMATOCRIT: 43.4 % (ref 36.0–46.0)
HEMOGLOBIN: 14.6 g/dL (ref 12.0–15.0)
Lymphocytes Relative: 50.4 % — ABNORMAL HIGH (ref 12.0–46.0)
Lymphs Abs: 6 10*3/uL — ABNORMAL HIGH (ref 0.7–4.0)
MCHC: 33.8 g/dL (ref 30.0–36.0)
MCV: 86.9 fl (ref 78.0–100.0)
MONO ABS: 0.7 10*3/uL (ref 0.1–1.0)
Monocytes Relative: 6.2 % (ref 3.0–12.0)
NEUTROS ABS: 5 10*3/uL (ref 1.4–7.7)
Neutrophils Relative %: 42.2 % — ABNORMAL LOW (ref 43.0–77.0)
Platelets: 301 10*3/uL (ref 150.0–400.0)
RBC: 4.99 Mil/uL (ref 3.87–5.11)
RDW: 13.6 % (ref 11.5–15.5)
WBC: 11.8 10*3/uL — AB (ref 4.0–10.5)

## 2016-07-07 LAB — COMPREHENSIVE METABOLIC PANEL
ALBUMIN: 4.1 g/dL (ref 3.5–5.2)
ALT: 9 U/L (ref 0–35)
AST: 17 U/L (ref 0–37)
Alkaline Phosphatase: 56 U/L (ref 39–117)
BUN: 19 mg/dL (ref 6–23)
CALCIUM: 9.8 mg/dL (ref 8.4–10.5)
CHLORIDE: 101 meq/L (ref 96–112)
CO2: 32 mEq/L (ref 19–32)
Creatinine, Ser: 1.07 mg/dL (ref 0.40–1.20)
GFR: 51.75 mL/min — ABNORMAL LOW (ref >60.00)
Glucose, Bld: 93 mg/dL (ref 70–99)
POTASSIUM: 4.4 meq/L (ref 3.5–5.1)
SODIUM: 141 meq/L (ref 135–145)
Total Bilirubin: 0.7 mg/dL (ref 0.2–1.2)
Total Protein: 7 g/dL (ref 6.0–8.3)

## 2016-07-07 LAB — HEMOGLOBIN A1C: HEMOGLOBIN A1C: 5.7 % (ref 4.6–6.5)

## 2016-07-07 LAB — TSH: TSH: 3 u[IU]/mL (ref 0.35–4.50)

## 2016-07-07 NOTE — Progress Notes (Signed)
I reviewed health advisor's note, was available for consultation, and agree with documentation and plan. Ohana Birdwell MD  

## 2016-07-07 NOTE — Progress Notes (Signed)
Subjective:   Linda Robinson is a 82 y.o. female who presents for Medicare Annual (Subsequent) preventive examination.  Review of Systems:  N/A Cardiac Risk Factors include: advanced age (>15men, >110 women);dyslipidemia;hypertension     Objective:     Vitals: BP 126/64 (BP Location: Right Arm, Patient Position: Sitting, Cuff Size: Normal)   Pulse (!) 51   Temp 98.6 F (37 C) (Oral)   Ht 5\' 2"  (1.575 m) Comment: no shoes  Wt 159 lb 4 oz (72.2 kg)   SpO2 97%   BMI 29.13 kg/m   Body mass index is 29.13 kg/m.   Tobacco History  Smoking Status  . Never Smoker  Smokeless Tobacco  . Never Used     Counseling given: No   Past Medical History:  Diagnosis Date  . Basal cell carcinoma    removed  . Chronic back pain    stenosis  . Constipation    takes Colace daily  . Depression    takes Zoloft daily  . Diverticulosis of colon   . GERD (gastroesophageal reflux disease)    atypical chest pain  . Gout    hx of;was on Allopurinol but taken off 6 wks ago by medical MD  . History of diverticulitis of colon   . HLD (hyperlipidemia)    takes Pravastatin daily  . HTN (hypertension)    takes Amlodipine and Metoprolol daily  . OA (osteoarthritis)    right knee  . Osteopenia    takes Calcium and Vit D daily  . Vertigo    takes Antivert daily as needed   Past Surgical History:  Procedure Laterality Date  . APPENDECTOMY  at age 54  . BACK SURGERY    . CATARACT EXTRACTION Right 04/2006  . CHOLECYSTECTOMY    . COLONOSCOPY    . KNEE SURGERY Right    Medial meniscus tear  . LUMBAR LAMINECTOMY/DECOMPRESSION MICRODISCECTOMY Bilateral 11/13/2014   Procedure: Lumbar one-two Bilateral laminectomy and foraminotomy, Left Lumbar one-two microdiscectomy ;  Surgeon: Faythe Ghee, MD;  Location: [facility];  Service: Neurosurgery;  Laterality: Bilateral;  . NECK SURGERY     x 2;fusion  . PARTIAL COLECTOMY  09/1999   d/t diverticulitis  . TUBAL LIGATION     Family History    Problem Relation Age of Onset  . Cancer Father     throat and bladder  . Diabetes Father   . Hypertension Father   . Heart attack Father   . Stroke Mother   . Alzheimer's disease Mother   . Hypertension Mother    History  Sexual Activity  . Sexual activity: No    Outpatient Encounter Prescriptions as of 07/07/2016  Medication Sig  . amLODipine (NORVASC) 10 MG tablet Take 1 tablet (10 mg total) by mouth daily.  . Calcium Carbonate-Vitamin D (CALCIUM 500 + D) 500-125 MG-UNIT TABS Take by mouth.  . Cranberry 250 MG CAPS Take 1 capsule by mouth daily.    Marland Kitchen docusate sodium (COLACE) 100 MG capsule Take 200 mg by mouth daily.    . fish oil-omega-3 fatty acids 1000 MG capsule Take 1 g by mouth 2 (two) times daily.   Marland Kitchen gabapentin (NEURONTIN) 300 MG capsule Take 1 capsule by mouth 3 (three) times daily.  . meclizine (ANTIVERT) 25 MG tablet Take 25 mg by mouth 4 (four) times daily as needed for dizziness.   . metoprolol succinate (TOPROL-XL) 50 MG 24 hr tablet Take 50 mg by mouth daily. Take with or  immediately following a meal.  . polycarbophil (FIBERCON) 625 MG tablet Take 625 mg by mouth daily as needed for diarrhea or loose stools.   . pravastatin (PRAVACHOL) 20 MG tablet TAKE ONE TABLET TO ONE TABLET DAILY  . sertraline (ZOLOFT) 50 MG tablet TAKE ONE AND ONE-HALF TABLETS DAILY   No facility-administered encounter medications on file as of 07/07/2016.     Activities of Daily Living In your present state of health, do you have any difficulty performing the following activities: 07/07/2016  Hearing? N  Vision? Y  Difficulty concentrating or making decisions? N  Walking or climbing stairs? N  Dressing or bathing? N  Doing errands, shopping? N  Preparing Food and eating ? N  Using the Toilet? N  In the past six months, have you accidently leaked urine? Y  Do you have problems with loss of bowel control? N  Managing your Medications? N  Managing your Finances? N  Housekeeping or  managing your Housekeeping? N  Some recent data might be hidden    Patient Care Team: Abner Greenspan, MD as PCP - General Max Paulla Dolly, MD as Consulting Physician (Orthopedic Surgery)    Assessment:     Hearing Screening   125Hz  250Hz  500Hz  1000Hz  2000Hz  3000Hz  4000Hz  6000Hz  8000Hz   Right ear:   0 0 0  0    Left ear:   0 0 0  0      Visual Acuity Screening   Right eye Left eye Both eyes  Without correction: 20/25-1 20/30-1 20/30  With correction:       Exercise Activities and Dietary recommendations Current Exercise Habits: The patient does not participate in regular exercise at present, Type of exercise: walking, Time (Minutes): 15, Frequency (Times/Week): 3, Weekly Exercise (Minutes/Week): 45, Intensity: Mild, Exercise limited by: orthopedic condition(s)  Goals    . Increase water intake          Starting 07/07/2016, I will continue to drink at least 6-8 glasses of water daily.       Fall Risk Fall Risk  07/07/2016 07/05/2015 06/27/2014 04/11/2013  Falls in the past year? No Yes Yes Yes  Number falls in past yr: - 1 1 1   Injury with Fall? - Yes No -  Risk for fall due to : - - - Impaired balance/gait   Depression Screen PHQ 2/9 Scores 07/07/2016 07/05/2015 06/27/2014 04/11/2013  PHQ - 2 Score 0 0 0 0     Cognitive Testing MMSE - Mini Mental State Exam 07/07/2016  Orientation to time 5  Orientation to Place 5  Registration 3  Attention/ Calculation 0  Recall 3  Language- name 2 objects 0  Language- repeat 1  Language- follow 3 step command 3  Language- read & follow direction 0  Write a sentence 0  Copy design 0  Total score 20   PLEASE NOTE: A Mini-Cog screen was completed. Maximum score is 20. A value of 0 denotes this part of [place] MMSE was not completed or the patient failed this part of the Mini-Cog screening.   Mini-Cog Screening Orientation to Time - Max 5 pts Orientation to Place - Max 5 pts Registration - Max 3 pts Recall - Max 3 pts Language  Repeat - Max 1 pts Language Follow 3 Step Command - Max 3 pts   Immunization History  Administered Date(s) Administered  . Influenza Split 07/23/2011  . Influenza Whole 08/11/2004, 07/08/2007, 07/09/2008, 06/19/2009, 07/08/2010  . Influenza, High Dose Seasonal PF 07/31/2015  .  Influenza-Unspecified 06/28/2013  . PPD Test 10/03/2015  . Pneumococcal Conjugate-13 06/27/2014  . Pneumococcal Polysaccharide-23 02/10/2012  . Td 08/28/1997, 02/10/2012   Screening Tests Health Maintenance  Topic Date Due  . MAMMOGRAM  07/18/2016  . TETANUS/TDAP  02/09/2022  . INFLUENZA VACCINE  Addressed  . DEXA SCAN  Completed  . ZOSTAVAX  Addressed  . PNA vac Low Risk Adult  Completed      Plan:     I have personally reviewed and addressed the Medicare Annual Wellness questionnaire and have noted the following in the patient's chart:  A. Medical and social history B. Use of alcohol, tobacco or illicit drugs  C. Current medications and supplements D. Functional ability and status E.  Nutritional status F.  Physical activity G. Advance directives H. List of other physicians I.  Hospitalizations, surgeries, and ER visits in previous 12 months J.  [place] to include hearing, vision, cognitive, depression L. Referrals and appointments - none  In addition, I have reviewed and discussed with patient certain preventive protocols, quality metrics, and best practice recommendations. A written personalized care plan for preventive services as well as general preventive health recommendations were provided to patient.  See attached scanned questionnaire for additional information.   Signed,   Lindell Noe, MHA, BS, LPN Health Advisor

## 2016-07-07 NOTE — Progress Notes (Signed)
PCP notes:   Health maintenance:  Flu vaccine - per pt, vaccine was administered at [facility]  Abnormal screenings:   Hearing - failed  Patient concerns:   None  Nurse concerns:  None  Next PCP appt:   07/13/16 @ 1215

## 2016-07-07 NOTE — Patient Instructions (Signed)
Linda Robinson , Thank you for taking time to come for your Medicare Wellness Visit. I appreciate your ongoing commitment to your health goals. Please review the following plan we discussed and let me know if I can assist you in the future.   These are the goals we discussed: Goals    . Increase water intake          Starting 07/07/2016, I will continue to drink at least 6-8 glasses of water daily.        This is a list of the screening recommended for you and due dates:  Health Maintenance  Topic Date Due  . Mammogram  07/18/2016  . Tetanus Vaccine  02/09/2022  . Flu Shot  Addressed  . DEXA scan (bone density measurement)  Completed  . Shingles Vaccine  Addressed  . Pneumonia vaccines  Completed   Preventive Care for Adults  A healthy lifestyle and preventive care can promote health and wellness. Preventive health guidelines for adults include the following key practices.  . A routine yearly physical is a good way to check with your health care provider about your health and preventive screening. It is a chance to share any concerns and updates on your health and to receive a thorough exam.  . Visit your dentist for a routine exam and preventive care every 6 months. Brush your teeth twice a day and floss once a day. Good oral hygiene prevents tooth decay and gum disease.  . The frequency of eye exams is based on your age, health, family medical history, use  of contact lenses, and other factors. Follow your health care provider's ecommendations for frequency of eye exams.  . Eat a healthy diet. Foods like vegetables, fruits, whole grains, low-fat dairy products, and lean protein foods contain the nutrients you need without too many calories. Decrease your intake of foods high in solid fats, added sugars, and salt. Eat the right amount of calories for you. Get information about a proper diet from your health care provider, if necessary.  . Regular physical exercise is one of the most  important things you can do for your health. Most adults should get at least 150 minutes of moderate-intensity exercise (any activity that increases your heart rate and causes you to sweat) each week. In addition, most adults need muscle-strengthening exercises on 2 or more days a week.  Silver Sneakers may be a benefit available to you. To determine eligibility, you may visit the website: www.silversneakers.com or contact program at 8141262416 Mon-Fri between 8AM-8PM.   . Maintain a healthy weight. The body mass index (BMI) is a screening tool to identify possible weight problems. It provides an estimate of body fat based on height and weight. Your health care provider can find your BMI and can help you achieve or maintain a healthy weight.   For adults 20 years and older: ? A BMI below 18.5 is considered underweight. ? A BMI of 18.5 to 24.9 is normal. ? A BMI of 25 to 29.9 is considered overweight. ? A BMI of 30 and above is considered obese.   . Maintain normal blood lipids and cholesterol levels by exercising and minimizing your intake of saturated fat. Eat a balanced diet with plenty of fruit and vegetables. Blood tests for lipids and cholesterol should begin at age 10 and be repeated every 5 years. If your lipid or cholesterol levels are high, you are over 50, or you are at high risk for heart disease, you may need  your cholesterol levels checked more frequently. Ongoing high lipid and cholesterol levels should be treated with medicines if diet and exercise are not working.  . If you smoke, find out from your health care provider how to quit. If you do not use tobacco, please do not start.  . If you choose to drink alcohol, please do not consume more than 2 drinks per day. One drink is considered to be 12 ounces (355 mL) of beer, 5 ounces (148 mL) of wine, or 1.5 ounces (44 mL) of liquor.  . If you are 46-36 years old, ask your health care provider if you should take aspirin to prevent  strokes.  . Use sunscreen. Apply sunscreen liberally and repeatedly throughout the day. You should seek shade when your shadow is shorter than you. Protect yourself by wearing long sleeves, pants, a wide-brimmed hat, and sunglasses year round, whenever you are outdoors.  . Once a month, do a whole body skin exam, using a mirror to look at the skin on your back. Tell your health care provider of new moles, moles that have irregular borders, moles that are larger than a pencil eraser, or moles that have changed in shape or color.

## 2016-07-07 NOTE — Progress Notes (Signed)
Pre visit review using our clinic review tool, if applicable. No additional management support is needed unless otherwise documented below in the visit note. 

## 2016-07-13 ENCOUNTER — Encounter: Payer: Self-pay | Admitting: Family Medicine

## 2016-07-13 ENCOUNTER — Ambulatory Visit (INDEPENDENT_AMBULATORY_CARE_PROVIDER_SITE_OTHER): Payer: Medicare Other | Admitting: Family Medicine

## 2016-07-13 VITALS — BP 128/70 | HR 48 | Temp 98.4°F | Ht 62.0 in | Wt 160.0 lb

## 2016-07-13 DIAGNOSIS — E785 Hyperlipidemia, unspecified: Secondary | ICD-10-CM

## 2016-07-13 DIAGNOSIS — D72829 Elevated white blood cell count, unspecified: Secondary | ICD-10-CM

## 2016-07-13 DIAGNOSIS — Z0000 Encounter for general adult medical examination without abnormal findings: Secondary | ICD-10-CM

## 2016-07-13 DIAGNOSIS — I10 Essential (primary) hypertension: Secondary | ICD-10-CM

## 2016-07-13 DIAGNOSIS — R739 Hyperglycemia, unspecified: Secondary | ICD-10-CM

## 2016-07-13 DIAGNOSIS — R001 Bradycardia, unspecified: Secondary | ICD-10-CM

## 2016-07-13 DIAGNOSIS — N289 Disorder of kidney and ureter, unspecified: Secondary | ICD-10-CM

## 2016-07-13 MED ORDER — METOPROLOL SUCCINATE ER 25 MG PO TB24: 25.0000 mg | ORAL_TABLET | Freq: Every day | ORAL | 3 refills | Status: DC

## 2016-07-13 NOTE — Assessment & Plan Note (Signed)
LDL is up -not sure why No missed doses of pravastatin  Disc goals for lipids and reasons to control them Rev labs with pt Rev low sat fat diet in detail Handout given on diet Will continue to follow  Will inc statin if needed

## 2016-07-13 NOTE — Assessment & Plan Note (Signed)
bp in fair control at this time  BP Readings from Last 1 Encounters:  07/13/16 128/70   No changes needed Disc lifstyle change with low sodium diet and exercise  In light of low pulse-will cut metoprolol to 25 mg ER and continue to follow Labs rev

## 2016-07-13 NOTE — Progress Notes (Signed)
Subjective:    Patient ID: Linda Robinson, female    DOB: [birth year], 82 y.o.   MRN: [id number]  HPI Here for health maintenance exam and to review chronic medical problems    Doing ok  Does her work with frequent breaks -gets by with that   Had her AMW this mo  Failed hearing exam- does not want hearing aides yet / she is not bothered by it  Husband already turns TV up too loud -he is worse   Had flu shot at [facility]  Wt Readings from Last 3 Encounters:  07/13/16 160 lb (72.6 kg)  07/07/16 159 lb 4 oz (72.2 kg)  05/19/16 158 lb (71.7 kg)  this is stable  Trying to eat a healthy diet  Cannot get a whole lot of exercise with her back - can't even sit for long periods of time  bmi is 29.2  Mammogram 10/16 neg Wants to keep doing these  Self breast exam -no lumps or changes   dexa 10/16-osteopenia in the forearm Takes her ca and D Exercise is limited No falls or fractures   FOBT neg 5/13 Has out aged colon cancer screening  No symptoms    bp is stable today  No cp or palpitations or headaches or edema  No side effects to medicines  BP Readings from Last 3 Encounters:  07/13/16 128/70  07/07/16 126/64  05/19/16 128/84    She is back off the hctz   Pulse Readings from Last 3 Encounters:  07/13/16 (!) 48  07/07/16 (!) 51  05/19/16 80   Pulse is on the low side  Sometimes gets a little dizzy if she gets up quickly  Is open to cutting the metoprolol in 1/2     Hx of renal insuff Lab Results  Component Value Date   CREATININE 1.07 07/07/2016   BUN 19 07/07/2016   NA 141 07/07/2016   K 4.4 07/07/2016   CL 101 07/07/2016   CO2 32 07/07/2016    Hx of hyperglycemia Lab Results  Component Value Date   HGBA1C 5.7 07/07/2016  up from 5.4  She eats sweets (her downfall) - she tries to limit them   Hx of hyperlipidemia  Lab Results  Component Value Date   CHOL 219 (H) 07/07/2016   CHOL 207 (H) 06/27/2015   CHOL 243 (H) 01/01/2015   Lab Results    Component Value Date   HDL 58.90 07/07/2016   HDL 58.30 06/27/2015   HDL 59.60 01/01/2015   Lab Results  Component Value Date   LDLCALC 139 (H) 07/07/2016   LDLCALC 119 (H) 06/27/2015   LDLCALC 155 (H) 01/01/2015   Lab Results  Component Value Date   TRIG 109.0 07/07/2016   TRIG 147.0 06/27/2015   TRIG 141.0 01/01/2015   Lab Results  Component Value Date   CHOLHDL 4 07/07/2016   CHOLHDL 4 06/27/2015   CHOLHDL 4 01/01/2015   Lab Results  Component Value Date   LDLDIRECT 135.4 04/11/2013   LDLDIRECT 134.0 02/03/2012   LDLDIRECT 150.8 10/30/2010    On pravastatin and diet  LDL is up  She has not missed doses of pravastatin  No fried food/ no red meat /no sausage and bacon /avoids high fat dairy/ no shellfish  Does not eat out  A little mayo with tomato sandwhich  Lab Results  Component Value Date   WBC 11.8 (H) 07/07/2016   HGB 14.6 07/07/2016   HCT 43.4 07/07/2016   MCV  86.9 07/07/2016   PLT 301.0 07/07/2016   Her wbc tends to run high occ for years  No s/s of infection     Chemistry      Component Value Date/Time   NA 141 07/07/2016 0902   K 4.4 07/07/2016 0902   CL 101 07/07/2016 0902   CO2 32 07/07/2016 0902   BUN 19 07/07/2016 0902   CREATININE 1.07 07/07/2016 0902   CREATININE 0.90 09/15/2013 1627      Component Value Date/Time   CALCIUM 9.8 07/07/2016 0902   ALKPHOS 56 07/07/2016 0902   AST 17 07/07/2016 0902   ALT 9 07/07/2016 0902   BILITOT 0.7 07/07/2016 0902     Lab Results  Component Value Date   TSH 3.00 07/07/2016    Patient Active Problem List   Diagnosis Date Noted  . Dizziness 05/19/2016  . Routine general medical examination at a health care facility 07/05/2015  . Estrogen deficiency 07/05/2015  . Hyperglycemia 01/01/2015  . Lumbar spinal stenosis 11/13/2014  . Vertigo 10/05/2014  . Elevated transaminase level 07/16/2014  . Leukocytosis 07/16/2014  . Risk for falls 06/27/2014  . Low back pain on right side with sciatica  07/24/2013  . Edema 04/26/2013  . Encounter for Medicare annual wellness exam 04/11/2013  . Bradycardia 05/19/2010  . Disorder of bone and cartilage 01/30/2009  . CONSTIPATION 11/01/2007  . Depression with anxiety 02/14/2007  . Hyperlipidemia LDL goal <130 02/04/2007  . Gout 02/04/2007  . Essential hypertension 02/04/2007  . DIVERTICULOSIS, COLON 02/04/2007  . Renal insufficiency 02/04/2007  . OSTEOARTHRITIS 02/04/2007  . [place] DISEASE 02/04/2007  . LEG CRAMPS 02/04/2007   Past Medical History:  Diagnosis Date  . Basal cell carcinoma    removed  . Chronic back pain    stenosis  . Constipation    takes Colace daily  . Depression    takes Zoloft daily  . Diverticulosis of colon   . GERD (gastroesophageal reflux disease)    atypical chest pain  . Gout    hx of;was on Allopurinol but taken off 6 wks ago by medical MD  . History of diverticulitis of colon   . HLD (hyperlipidemia)    takes Pravastatin daily  . HTN (hypertension)    takes Amlodipine and Metoprolol daily  . OA (osteoarthritis)    right knee  . Osteopenia    takes Calcium and Vit D daily  . Vertigo    takes Antivert daily as needed   Past Surgical History:  Procedure Laterality Date  . APPENDECTOMY  at age 48  . BACK SURGERY    . CATARACT EXTRACTION Right 04/2006  . CHOLECYSTECTOMY    . COLONOSCOPY    . KNEE SURGERY Right    Medial meniscus tear  . LUMBAR LAMINECTOMY/DECOMPRESSION MICRODISCECTOMY Bilateral 11/13/2014   Procedure: Lumbar one-two Bilateral laminectomy and foraminotomy, Left Lumbar one-two microdiscectomy ;  Surgeon: Faythe Ghee, MD;  Location: [facility];  Service: Neurosurgery;  Laterality: Bilateral;  . NECK SURGERY     x 2;fusion  . PARTIAL COLECTOMY  09/1999   d/t diverticulitis  . TUBAL LIGATION     Social History  Substance Use Topics  . Smoking status: Never Smoker  . Smokeless tobacco: Never Used  . Alcohol use No   Family History  Problem Relation Age of  Onset  . Cancer Father     throat and bladder  . Diabetes Father   . Hypertension Father   . Heart attack Father   .  Stroke Mother   . Alzheimer's disease Mother   . Hypertension Mother    Allergies  Allergen Reactions  . Ace Inhibitors     REACTION: increased K+, increased creat.  . Cephalosporins     REACTION: reaction not known  . Colesevelam     REACTION: constipation  . Lovastatin     REACTION: doesn't work  . Raloxifene     REACTION: cramps  . Rosuvastatin     REACTION: myalgia  . Simvastatin     REACTION: myalgia   Current Outpatient Prescriptions on File Prior to Visit  Medication Sig Dispense Refill  . amLODipine (NORVASC) 10 MG tablet Take 1 tablet (10 mg total) by mouth daily. 90 tablet 3  . Calcium Carbonate-Vitamin D (CALCIUM 500 + D) 500-125 MG-UNIT TABS Take by mouth.    . Cranberry 250 MG CAPS Take 1 capsule by mouth daily.      Marland Kitchen docusate sodium (COLACE) 100 MG capsule Take 200 mg by mouth daily.      . fish oil-omega-3 fatty acids 1000 MG capsule Take 1 g by mouth 2 (two) times daily.     Marland Kitchen gabapentin (NEURONTIN) 300 MG capsule Take 1 capsule by mouth 3 (three) times daily.    . meclizine (ANTIVERT) 25 MG tablet Take 25 mg by mouth 4 (four) times daily as needed for dizziness.     . polycarbophil (FIBERCON) 625 MG tablet Take 625 mg by mouth daily as needed for diarrhea or loose stools.     . pravastatin (PRAVACHOL) 20 MG tablet TAKE ONE TABLET TO ONE TABLET DAILY (Patient taking differently: Take 10 mg by mouth daily. TAKE ONE TABLET TO ONE TABLET DAILY) 90 tablet 1  . sertraline (ZOLOFT) 50 MG tablet TAKE ONE AND ONE-HALF TABLETS DAILY 135 tablet 0   No current facility-administered medications on file prior to visit.      Review of Systems    Review of Systems  Constitutional: Negative for fever, appetite change, fatigue and unexpected weight change.  Eyes: Negative for pain and visual disturbance.  Respiratory: Negative for cough and shortness of  breath.   Cardiovascular: Negative for cp or palpitations    Gastrointestinal: Negative for nausea, diarrhea and constipation.  Genitourinary: Negative for urgency and frequency.  Skin: Negative for pallor or rash   MSK pos for chronic back pain  Neurological: Negative for weakness, light-headedness, numbness and headaches.  Hematological: Negative for adenopathy. Does not bruise/bleed easily.  Psychiatric/Behavioral: Negative for dysphoric mood. The patient is not nervous/anxious.      Objective:   Physical Exam  Constitutional: She appears well-developed and well-nourished. No distress.  overwt and well app  HENT:  Head: Normocephalic and atraumatic.  Right Ear: External ear normal.  Left Ear: External ear normal.  Mouth/Throat: Oropharynx is clear and moist.  Eyes: Conjunctivae and EOM are normal. Pupils are equal, round, and reactive to light. No scleral icterus.  Neck: Normal range of motion. Neck supple. No JVD present. Carotid bruit is not present. No thyromegaly present.  Cardiovascular: Regular rhythm, normal heart sounds and intact distal pulses.  Exam reveals no gallop.   bradycardia  Pulmonary/Chest: Effort normal and breath sounds normal. No respiratory distress. She has no wheezes. She exhibits no tenderness.  Abdominal: Soft. Bowel sounds are normal. She exhibits no distension, no abdominal bruit and no mass. There is no tenderness.  Genitourinary: No breast swelling, tenderness, discharge or bleeding.  Genitourinary Comments: Breast exam: No mass, nodules, thickening, tenderness, bulging, retraction,  inflamation, nipple discharge or skin changes noted.  No axillary or clavicular LA.      Musculoskeletal: She exhibits no edema or tenderness.  Walks with a cane Poor rom spine   Lymphadenopathy:    She has no cervical adenopathy.  Neurological: She is alert. She has normal reflexes. No cranial nerve deficit. She exhibits normal muscle tone. Coordination normal.  Skin:  Skin is warm and dry. No rash noted. No erythema. No pallor.  SKs diffusely  Psychiatric: She has a normal mood and affect.          Assessment & Plan:   Problem List Items Addressed This Visit      Cardiovascular and Mediastinum   Essential hypertension (Chronic)    bp in fair control at this time  BP Readings from Last 1 Encounters:  07/13/16 128/70   No changes needed Disc lifstyle change with low sodium diet and exercise  In light of low pulse-will cut metoprolol to 25 mg ER and continue to follow Labs rev      Relevant Medications   metoprolol succinate (TOPROL-XL) 25 MG 24 hr tablet     Genitourinary   Renal insufficiency    Stable-likely multifactorial Pt is on hctz Stressed imp of hydration         Other   Bradycardia    With occ dizziness with pos change  Will cut metoprolol er to 25 mg as tolerated       Hyperglycemia    Lab Results  Component Value Date   HGBA1C 5.7 07/07/2016   Counseled on low glycemic diet and wt control to prevent DM 2      Hyperlipidemia LDL goal <130 (Chronic)    LDL is up -not sure why No missed doses of pravastatin  Disc goals for lipids and reasons to control them Rev labs with pt Rev low sat fat diet in detail Handout given on diet Will continue to follow  Will inc statin if needed       Relevant Medications   metoprolol succinate (TOPROL-XL) 25 MG 24 hr tablet   Leukocytosis    Watching this - wbc tends to go up and down (mild) No symptoms or s/s of infection  Continue to follow        Routine general medical examination at a health care facility - Primary    Reviewed health habits including diet and exercise and skin cancer prevention Reviewed appropriate screening tests for age  Also reviewed health mt list, fam hx and immunization status , as well as social and family history   See HPI AMW reviewed -pt does not want to address hearing loss at this time Labs reviewed Don't forget to make your  mammogram appointment  Since your pulse is still a little low- we will cut metoprolol to 25 mg once daily  To prevent diabetes - try to cut sweets to once per week  And also limit portion sizes of carbs (bread/pasta/potato/cereal/rice)  Your cholesterol is up  Avoid red meat/ fried foods/ egg yolks/ fatty breakfast meats/ butter, cheese and high fat dairy/ and shellfish    Take care of yourself        Other Visit Diagnoses   None.

## 2016-07-13 NOTE — Progress Notes (Signed)
Pre visit review using our clinic review tool, if applicable. No additional management support is needed unless otherwise documented below in the visit note. 

## 2016-07-13 NOTE — Assessment & Plan Note (Signed)
Stable-likely multifactorial Pt is on hctz Stressed imp of hydration

## 2016-07-13 NOTE — Assessment & Plan Note (Signed)
Reviewed health habits including diet and exercise and skin cancer prevention Reviewed appropriate screening tests for age  Also reviewed health mt list, fam hx and immunization status , as well as social and family history   See HPI AMW reviewed -pt does not want to address hearing loss at this time Labs reviewed Don't forget to make your mammogram appointment  Since your pulse is still a little low- we will cut metoprolol to 25 mg once daily  To prevent diabetes - try to cut sweets to once per week  And also limit portion sizes of carbs (bread/pasta/potato/cereal/rice)  Your cholesterol is up  Avoid red meat/ fried foods/ egg yolks/ fatty breakfast meats/ butter, cheese and high fat dairy/ and shellfish    Take care of yourself

## 2016-07-13 NOTE — Assessment & Plan Note (Signed)
Watching this - wbc tends to go up and down (mild) No symptoms or s/s of infection  Continue to follow

## 2016-07-13 NOTE — Patient Instructions (Addendum)
Don't forget to make your mammogram appointment  Since your pulse is still a little low- we will cut metoprolol to 25 mg once daily  To prevent diabetes - try to cut sweets to once per week  And also limit portion sizes of carbs (bread/pasta/potato/cereal/rice)  Your cholesterol is up  Avoid red meat/ fried foods/ egg yolks/ fatty breakfast meats/ butter, cheese and high fat dairy/ and shellfish    Take care of yourself

## 2016-07-13 NOTE — Assessment & Plan Note (Signed)
Lab Results  Component Value Date   HGBA1C 5.7 07/07/2016   Counseled on low glycemic diet and wt control to prevent DM 2

## 2016-07-13 NOTE — Assessment & Plan Note (Signed)
With occ dizziness with pos change  Will cut metoprolol er to 25 mg as tolerated

## 2016-07-20 ENCOUNTER — Other Ambulatory Visit: Payer: Self-pay | Admitting: Family Medicine

## 2016-07-30 NOTE — Telephone Encounter (Signed)
Pt left v/m; pt notified by [facility] that pt is no longer taking furosemide. Pt is not aware she was to stop furosemide. Per 01/24/16 phone note pt was to stop furosemide. Pt request cb.

## 2016-07-30 NOTE — Telephone Encounter (Signed)
According to her med list at her last visit with me she was off both the hctz (which made her feel bad) and the lasix (which we stopped earlier when she had labile bp)  So -if she is Linda Robinson still taking lasix please go ahead and refill it for a year Please verify she is still off the hctz - thanks

## 2016-07-31 MED ORDER — FUROSEMIDE 20 MG PO TABS: ORAL_TABLET | ORAL | 3 refills | Status: DC

## 2016-07-31 NOTE — Telephone Encounter (Signed)
Pt said she restarted the lasix because her ankles and feet started to swell, pt did verify that she is not taking the HCTZ and she hasn't had any problems while back on the lasix, med refilled and pt aware

## 2016-07-31 NOTE — Telephone Encounter (Signed)
Left voicemail requesting pt to call the office back 

## 2016-07-31 NOTE — Addendum Note (Signed)
Addended by: Tammi Sou on: 07/31/2016 03:00 PM   Modules accepted: Orders

## 2016-08-12 ENCOUNTER — Other Ambulatory Visit: Payer: Self-pay | Admitting: Family Medicine

## 2016-09-02 ENCOUNTER — Encounter: Payer: Self-pay | Admitting: Family Medicine

## 2016-09-17 ENCOUNTER — Other Ambulatory Visit: Payer: Self-pay | Admitting: Family Medicine

## 2016-09-17 NOTE — Telephone Encounter (Signed)
Please refill to her appt -thanks

## 2016-09-17 NOTE — Telephone Encounter (Signed)
Pt has CPE scheduled for 06/23/17, last filled on 06/18/16 #135 tabs with 0 refills, please advise

## 2016-09-17 NOTE — Telephone Encounter (Signed)
done

## 2016-10-05 ENCOUNTER — Telehealth: Payer: Self-pay | Admitting: Family Medicine

## 2016-10-05 NOTE — Telephone Encounter (Signed)
Patient had a physical done last October.  If she has it done in October this year, she won't get a reward from [facility].  Patient said if she has the physical done in June, she can get the reward.  Can patient have her physical done in June?  Patient said a detailed message can be left on her voice mail with Dr.Tower's response.

## 2016-10-05 NOTE — Telephone Encounter (Signed)
That is fine with me as long as she knows her insurance will pay for it

## 2016-10-05 NOTE — Telephone Encounter (Signed)
Patient advised.

## 2016-10-29 ENCOUNTER — Other Ambulatory Visit: Payer: Self-pay | Admitting: Family Medicine

## 2017-02-23 ENCOUNTER — Telehealth: Payer: Self-pay | Admitting: Family Medicine

## 2017-02-23 ENCOUNTER — Encounter: Payer: Self-pay | Admitting: Family Medicine

## 2017-02-23 ENCOUNTER — Ambulatory Visit (INDEPENDENT_AMBULATORY_CARE_PROVIDER_SITE_OTHER): Payer: Medicare Other | Admitting: Family Medicine

## 2017-02-23 ENCOUNTER — Ambulatory Visit (INDEPENDENT_AMBULATORY_CARE_PROVIDER_SITE_OTHER)
Admission: RE | Admit: 2017-02-23 | Discharge: 2017-02-23 | Disposition: A | Discharge status: Home / Self Care | Payer: Medicare Other | Source: Ambulatory Visit | Attending: Family Medicine | Admitting: Family Medicine

## 2017-02-23 VITALS — BP 142/70 | HR 62 | Temp 98.2°F | Ht 62.0 in | Wt 144.5 lb

## 2017-02-23 DIAGNOSIS — R634 Abnormal weight loss: Secondary | ICD-10-CM

## 2017-02-23 DIAGNOSIS — I10 Essential (primary) hypertension: Secondary | ICD-10-CM | POA: Diagnosis not present

## 2017-02-23 DIAGNOSIS — R001 Bradycardia, unspecified: Secondary | ICD-10-CM

## 2017-02-23 DIAGNOSIS — R5383 Other fatigue: Secondary | ICD-10-CM

## 2017-02-23 DIAGNOSIS — D72829 Elevated white blood cell count, unspecified: Secondary | ICD-10-CM

## 2017-02-23 DIAGNOSIS — R739 Hyperglycemia, unspecified: Secondary | ICD-10-CM

## 2017-02-23 DIAGNOSIS — R0789 Other chest pain: Secondary | ICD-10-CM

## 2017-02-23 DIAGNOSIS — R42 Dizziness and giddiness: Secondary | ICD-10-CM | POA: Diagnosis not present

## 2017-02-23 NOTE — Telephone Encounter (Signed)
I will see her then  

## 2017-02-23 NOTE — Assessment & Plan Note (Signed)
Lab today  More fatigue and malaise

## 2017-02-23 NOTE — Telephone Encounter (Signed)
Patient scheduled appointment today at 4:15.

## 2017-02-23 NOTE — Assessment & Plan Note (Signed)
With exertion or prolonged standing  Also nausea and feeling faint (no syncope)  Hx of bradycardia  bp is stable  ekg stable cxr today-pending  Likely-cardiac w/u  Disc red flags for ED viist

## 2017-02-23 NOTE — Telephone Encounter (Signed)
Please add her in the 4:15 slot if she can come today (otherwise I will see her tomorrow) I will cc this to [facility] in box as well as the sender Thanks

## 2017-02-23 NOTE — Telephone Encounter (Signed)
Pt called to get appt. She started feeling weak and nauseated on Friday 5/25 and would like to be seen. I reserved the 3:00 pm spot tomorrow for her but told her I would call back. Is this ok or do you see any other appt time? She didn't want to wait until Friday.

## 2017-02-23 NOTE — Patient Instructions (Signed)
Labs today for fatigue and other symptoms  (you can cancel your lab appt for Tuesday)  Chest xray today  We may refer you to cardiology after we get results  If your symptoms persist/ suddenly worsen please go to the ER    Take care of yourself  Get fluids and eat regular meals

## 2017-02-23 NOTE — Assessment & Plan Note (Signed)
This is stable On low dose metoprolol  occ dizziness- pulse and bp stable

## 2017-02-23 NOTE — Assessment & Plan Note (Signed)
Lab today  Wellness visit upcoming

## 2017-02-23 NOTE — Assessment & Plan Note (Signed)
Intermittent with standing and also fatigue  Stable EKG Lab today  Low threshold for cardiac w/u  No syncope

## 2017-02-23 NOTE — Assessment & Plan Note (Signed)
Pt has lost over 20 lb in a year  States she is less hungry and eating less in general  ? If age related  Lab and cxr today  F/u is planned soon

## 2017-02-23 NOTE — Assessment & Plan Note (Signed)
bp in fair control at this time (not orthostatic or low) BP Readings from Last 1 Encounters:  02/23/17 (!) 142/70   No changes needed Disc lifstyle change with low sodium diet and exercise  Labs today

## 2017-02-23 NOTE — Progress Notes (Signed)
Subjective:    Patient ID: Linda Robinson, female    DOB: 06-11-1929, 83 y.o.   MRN: [id number]  HPI Here for fatigue/ malaise and some chest discomfort   This started with a fall 2 wk ago  She was climbing steps with a mop- landed on her chin on the R side - did not hurt her badly  She started feeling bad about a week later   She feels hot and faint if she stands very long (and sweaty) For instance when she is cooking  More likely in the am  Then she sits and uses cool water on face Gets nauseated but does not vomit  Pressure in her chest /a funny feeling-almost numb)   Wt Readings from Last 3 Encounters:  02/23/17 144 lb 8 oz (65.5 kg)  07/13/16 160 lb (72.6 kg)  07/07/16 159 lb 4 oz (72.2 kg)  significant wt loss from fall  Down from 168   3/17 She is generally eating less as she gets older   She burps a lot more these days  No heartburn Yesterday she had some low abd pain- that was brief  She takes stool softener to stay regular - now she is regular     No significant changes on EKG Rate of 60 NSR Nonspecific ST dep in lat leads-no change from previous  Gets tired easily - with any activity (for a week) - fine until then    bp is stable today  No cp or palpitations or headaches or edema  No side effects to medicines  BP Readings from Last 3 Encounters:  02/23/17 (!) 142/70  07/13/16 128/70  07/07/16 126/64    On low dose metoprolol  Back on hctz    Hx of bradycardia Pulse Readings from Last 3 Encounters:  02/23/17 62  07/13/16 (!) 48  07/07/16 (!) 51   Stress- great grand daughter had a car accident - rear ended someone (no one was hurt)   Patient Active Problem List   Diagnosis Date Noted  . Weight loss 02/23/2017  . Chest discomfort 02/23/2017  . Dizziness 05/19/2016  . Routine general medical examination at a health care facility 07/05/2015  . Estrogen deficiency 07/05/2015  . Hyperglycemia 01/01/2015  . Lumbar spinal stenosis 11/13/2014    . Vertigo 10/05/2014  . Elevated transaminase level 07/16/2014  . Leukocytosis 07/16/2014  . Risk for falls 06/27/2014  . Low back pain on right side with sciatica 07/24/2013  . Edema 04/26/2013  . Encounter for Medicare annual wellness exam 04/11/2013  . Bradycardia 05/19/2010  . Disorder of bone and cartilage 01/30/2009  . CONSTIPATION 11/01/2007  . Depression with anxiety 02/14/2007  . Hyperlipidemia LDL goal <130 02/04/2007  . Gout 02/04/2007  . Essential hypertension 02/04/2007  . DIVERTICULOSIS, COLON 02/04/2007  . Renal insufficiency 02/04/2007  . OSTEOARTHRITIS 02/04/2007  . Benson DISEASE 02/04/2007  . LEG CRAMPS 02/04/2007   Past Medical History:  Diagnosis Date  . Basal cell carcinoma    removed  . Chronic back pain    stenosis  . Constipation    takes Colace daily  . Depression    takes Zoloft daily  . Diverticulosis of colon   . GERD (gastroesophageal reflux disease)    atypical chest pain  . Gout    hx of;was on Allopurinol but taken off 6 wks ago by medical MD  . History of diverticulitis of colon   . HLD (hyperlipidemia)    takes Pravastatin daily  .  HTN (hypertension)    takes Amlodipine and Metoprolol daily  . OA (osteoarthritis)    right knee  . Osteopenia    takes Calcium and Vit D daily  . Vertigo    takes Antivert daily as needed   Past Surgical History:  Procedure Laterality Date  . APPENDECTOMY  at age 18  . BACK SURGERY    . CATARACT EXTRACTION Right 04/2006  . CHOLECYSTECTOMY    . COLONOSCOPY    . KNEE SURGERY Right    Medial meniscus tear  . LUMBAR LAMINECTOMY/DECOMPRESSION MICRODISCECTOMY Bilateral 11/13/2014   Procedure: Lumbar one-two Bilateral laminectomy and foraminotomy, Left Lumbar one-two microdiscectomy ;  Surgeon: Faythe Ghee, MD;  Location: [facility];  Service: Neurosurgery;  Laterality: Bilateral;  . NECK SURGERY     x 2;fusion  . PARTIAL COLECTOMY  09/1999   d/t diverticulitis  . TUBAL LIGATION      Social History  Substance Use Topics  . Smoking status: Never Smoker  . Smokeless tobacco: Never Used  . Alcohol use No   Family History  Problem Relation Age of Onset  . Cancer Father        throat and bladder  . Diabetes Father   . Hypertension Father   . Heart attack Father   . Stroke Mother   . Alzheimer's disease Mother   . Hypertension Mother    Allergies  Allergen Reactions  . Ace Inhibitors     REACTION: increased K+, increased creat.  . Cephalosporins     REACTION: reaction not known  . Colesevelam     REACTION: constipation  . Lovastatin     REACTION: doesn't work  . Raloxifene     REACTION: cramps  . Rosuvastatin     REACTION: myalgia  . Simvastatin     REACTION: myalgia   Current Outpatient Prescriptions on File Prior to Visit  Medication Sig Dispense Refill  . amLODipine (NORVASC) 10 MG tablet TAKE 1 TABLET DAILY 90 tablet 3  . Calcium Carbonate-Vitamin D (CALCIUM 500 + D) 500-125 MG-UNIT TABS Take by mouth.    . Cranberry 250 MG CAPS Take 1 capsule by mouth daily.      Marland Kitchen docusate sodium (COLACE) 100 MG capsule Take 200 mg by mouth daily.      . fish oil-omega-3 fatty acids 1000 MG capsule Take 1 g by mouth 2 (two) times daily.     . furosemide (LASIX) 20 MG tablet Take one tablet by mouth daily as needed for swelling 90 tablet 3  . meclizine (ANTIVERT) 25 MG tablet Take 25 mg by mouth 4 (four) times daily as needed for dizziness.     . metoprolol succinate (TOPROL-XL) 25 MG 24 hr tablet Take 1 tablet (25 mg total) by mouth daily. 90 tablet 3  . polycarbophil (FIBERCON) 625 MG tablet Take 625 mg by mouth daily as needed for diarrhea or loose stools.     . pravastatin (PRAVACHOL) 20 MG tablet TAKE 1 TABLET DAILY 90 tablet 1  . sertraline (ZOLOFT) 50 MG tablet TAKE ONE AND ONE-HALF TABLETS DAILY 135 tablet 2   No current facility-administered medications on file prior to visit.      Review of Systems Review of Systems  Constitutional: Negative for  fever,  Pos for fatigue and wt loss  Eyes: Negative for pain and visual disturbance.  Respiratory: Negative for cough and shortness of breath.   Cardiovascular: Negative for pedal edema  or palpitations    Gastrointestinal: Negative for  vomiting, diarrhea and constipation. neg for blood in stool Genitourinary: Negative for urgency and frequency. neg for dysuria or hematuria  Skin: Negative for pallor or rash   Neurological: Negative for weakness,  numbness and headaches. pos for episodes of faint feeling w/o vertigo Hematological: Negative for adenopathy. Does not bruise/bleed easily.  Psychiatric/Behavioral: Negative for dysphoric mood. The patient is sometimes nervous/anxious.         Objective:   Physical Exam  Constitutional: She appears well-developed and well-nourished. No distress.  Well appearing elderly female Feeling fine today at rest  HENT:  Head: Normocephalic and atraumatic.  Mouth/Throat: Oropharynx is clear and moist.  Eyes: Conjunctivae and EOM are normal. Pupils are equal, round, and reactive to light. Right eye exhibits no discharge. Left eye exhibits no discharge. No scleral icterus.  Neck: Normal range of motion. Neck supple. No JVD present. Carotid bruit is not present. No thyromegaly present.  Cardiovascular: Regular rhythm, normal heart sounds and intact distal pulses.  Exam reveals no gallop.   No murmur heard. Rate of 60  Pulmonary/Chest: Effort normal and breath sounds normal. No respiratory distress. She has no wheezes. She has no rales.  No crackles  Abdominal: Soft. Bowel sounds are normal. She exhibits no distension, no abdominal bruit and no mass. There is no tenderness.  Musculoskeletal: She exhibits no edema.  Lymphadenopathy:    She has no cervical adenopathy.  Neurological: She is alert. She has normal reflexes. No cranial nerve deficit. She exhibits normal muscle tone. She displays a negative Romberg sign. Coordination and gait normal.  Skin: Skin  is warm and dry. No rash noted. No pallor.  Psychiatric: She has a normal mood and affect.  Nl affect In good spirits           Assessment & Plan:   Problem List Items Addressed This Visit      Cardiovascular and Mediastinum   Essential hypertension (Chronic)    bp in fair control at this time (not orthostatic or low) BP Readings from Last 1 Encounters:  02/23/17 (!) 142/70   No changes needed Disc lifstyle change with low sodium diet and exercise  Labs today       Relevant Orders   CBC with Differential/Platelet   Comprehensive metabolic panel   Lipid panel   TSH     Other   Bradycardia    This is stable On low dose metoprolol  occ dizziness- pulse and bp stable      Chest discomfort    With exertion or prolonged standing  Also nausea and feeling faint (no syncope)  Hx of bradycardia  bp is stable  ekg stable cxr today-pending  Likely-cardiac w/u  Disc red flags for ED viist       Relevant Orders   EKG 12-Lead (Completed)   DG Chest 2 View   Dizziness    Intermittent with standing and also fatigue  Stable EKG Lab today  Low threshold for cardiac w/u  No syncope       Hyperglycemia    Lab today  Wellness visit upcoming      Relevant Orders   Hemoglobin A1c   Leukocytosis    Lab today  More fatigue and malaise       Relevant Orders   CBC with Differential/Platelet   Weight loss    Pt has lost over 20 lb in a year  States she is less hungry and eating less in general  ? If age related  Lab and  cxr today  F/u is planned soon       Other Visit Diagnoses    Other fatigue    -  Primary   Relevant Orders   EKG 12-Lead (Completed)   Vitamin B12   DG Chest 2 View

## 2017-02-24 LAB — COMPREHENSIVE METABOLIC PANEL
ALBUMIN: 4.8 g/dL (ref 3.5–5.2)
ALK PHOS: 60 U/L (ref 39–117)
ALT: 10 U/L (ref 0–35)
AST: 19 U/L (ref 0–37)
BUN: 19 mg/dL (ref 6–23)
CALCIUM: 10.1 mg/dL (ref 8.4–10.5)
CHLORIDE: 100 meq/L (ref 96–112)
CO2: 32 mEq/L (ref 19–32)
Creatinine, Ser: 1.03 mg/dL (ref 0.40–1.20)
GFR: 54 mL/min — ABNORMAL LOW (ref >60.00)
Glucose, Bld: 101 mg/dL — ABNORMAL HIGH (ref 70–99)
POTASSIUM: 4.7 meq/L (ref 3.5–5.1)
Sodium: 140 mEq/L (ref 135–145)
TOTAL PROTEIN: 7.8 g/dL (ref 6.0–8.3)
Total Bilirubin: 0.7 mg/dL (ref 0.2–1.2)

## 2017-02-24 LAB — LIPID PANEL
CHOLESTEROL: 231 mg/dL — AB (ref 0–200)
HDL: 65.7 mg/dL (ref >39.00)
LDL CALC: 141 mg/dL — AB (ref 0–99)
NonHDL: 165.35
TRIGLYCERIDES: 120 mg/dL (ref 0.0–149.0)
Total CHOL/HDL Ratio: 4
VLDL: 24 mg/dL (ref 0.0–40.0)

## 2017-02-24 LAB — CBC WITH DIFFERENTIAL/PLATELET
BASOS PCT: 1.5 % (ref 0.0–3.0)
Basophils Absolute: 0.2 10*3/uL — ABNORMAL HIGH (ref 0.0–0.1)
Eosinophils Absolute: 0.2 10*3/uL (ref 0.0–0.7)
Eosinophils Relative: 1.8 % (ref 0.0–5.0)
HCT: 45.4 % (ref 36.0–46.0)
HEMOGLOBIN: 15 g/dL (ref 12.0–15.0)
LYMPHS PCT: 47.7 % — AB (ref 12.0–46.0)
Lymphs Abs: 6.4 10*3/uL — ABNORMAL HIGH (ref 0.7–4.0)
MCHC: 33 g/dL (ref 30.0–36.0)
MCV: 87.7 fl (ref 78.0–100.0)
MONO ABS: 1 10*3/uL (ref 0.1–1.0)
Monocytes Relative: 7.3 % (ref 3.0–12.0)
Neutro Abs: 5.6 10*3/uL (ref 1.4–7.7)
Neutrophils Relative %: 41.7 % — ABNORMAL LOW (ref 43.0–77.0)
Platelets: 318 10*3/uL (ref 150.0–400.0)
RBC: 5.17 Mil/uL — AB (ref 3.87–5.11)
RDW: 13.8 % (ref 11.5–15.5)
WBC: 13.4 10*3/uL — AB (ref 4.0–10.5)

## 2017-02-24 LAB — VITAMIN B12: Vitamin B-12: 661 pg/mL (ref 211–911)

## 2017-02-24 LAB — TSH: TSH: 2.73 u[IU]/mL (ref 0.35–4.50)

## 2017-02-24 LAB — HEMOGLOBIN A1C: HEMOGLOBIN A1C: 5.8 % (ref 4.6–6.5)

## 2017-02-26 ENCOUNTER — Telehealth: Payer: Self-pay | Admitting: Family Medicine

## 2017-02-26 DIAGNOSIS — R0789 Other chest pain: Secondary | ICD-10-CM

## 2017-02-26 NOTE — Telephone Encounter (Signed)
Left voicemail letting pt know Dr. Marliss Coots comments and pt already has cardiology appt on Monday

## 2017-02-26 NOTE — Telephone Encounter (Signed)
I did the referral  Will route to [facility] Continue current medicines

## 2017-02-26 NOTE — Telephone Encounter (Signed)
-----   Message from Tammi Sou, [place] sent at 02/26/2017 10:36 AM EDT ----- Pt agrees with referral to Cardiologist, [place] if possible, still very weak and shakey, cloudy headed, she said she sleeps ok at night, she got up today and BP was 152/63 pulse 59, last night pulse was 50 and pt has been taking both BP meds, pt wants to know what she should do, pt said she took metoprolo 1/2 tab as you prescribed. Yesterday BP was 142/62, an at 6:00pm yesterday it was 161/72

## 2017-03-01 ENCOUNTER — Ambulatory Visit (INDEPENDENT_AMBULATORY_CARE_PROVIDER_SITE_OTHER): Payer: Medicare Other | Admitting: Cardiovascular Disease

## 2017-03-01 ENCOUNTER — Telehealth: Payer: Self-pay | Admitting: Cardiovascular Disease

## 2017-03-01 ENCOUNTER — Encounter: Payer: Self-pay | Admitting: Cardiovascular Disease

## 2017-03-01 VITALS — BP 162/60 | HR 57 | Ht 62.0 in | Wt 152.5 lb

## 2017-03-01 DIAGNOSIS — I10 Essential (primary) hypertension: Secondary | ICD-10-CM

## 2017-03-01 DIAGNOSIS — I200 Unstable angina: Secondary | ICD-10-CM | POA: Diagnosis not present

## 2017-03-01 DIAGNOSIS — E785 Hyperlipidemia, unspecified: Secondary | ICD-10-CM | POA: Diagnosis not present

## 2017-03-01 MED ORDER — NITROGLYCERIN 0.4 MG SL SUBL: 0.4000 mg | SUBLINGUAL_TABLET | SUBLINGUAL | 3 refills | Status: DC | PRN

## 2017-03-01 MED ORDER — CARVEDILOL 3.125 MG PO TABS: 3.1250 mg | ORAL_TABLET | Freq: Two times a day (BID) | ORAL | 1 refills | Status: DC

## 2017-03-01 MED ORDER — ISOSORBIDE MONONITRATE ER 30 MG PO TB24: 30.0000 mg | ORAL_TABLET | Freq: Every day | ORAL | 1 refills | Status: DC

## 2017-03-01 MED ORDER — ASPIRIN EC 81 MG PO TBEC: 81.0000 mg | DELAYED_RELEASE_TABLET | Freq: Every day | ORAL | 3 refills | Status: AC

## 2017-03-01 NOTE — Progress Notes (Signed)
Cardiology Office Note   Date:  03/01/2017   ID:  Linda Robinson, DOB 02/07/1929, MRN [id number]  PCP:  Abner Greenspan, MD  Cardiologist:   Kathlyn Sacramento, MD   Chief Complaint  Patient presents with  . other    New patient. Patient C/o Chest discomfort, blood pressure going up and down, and weakness lasting about a week. Meds reviewed verbally with patient.       History of Present Illness: Linda Robinson is a 83 y.o. female who was referred by Dr. Glori Robinson for evaluation of exertional chest pain. She has no previous cardiac history. She has known history of hypertension and hyperlipidemia. She is not diabetic. She is a lifelong nonsmoker. She has family history of coronary artery disease but not prematurely. She has been relatively healthy throughout her life. Over the last 1-2 weeks, she has experienced generalized fatigue and exercise intolerance. This has been associated with substernal chest tightness which only happens with activities when she is trying to do chores around the house. The discomfort last for about 5-10 minutes and resolves with resting. She is not to have bradycardia. The dose of Toprol was gradually decreased and currently she is taking 25 mg once daily. In spite of that, she had no improvement in symptoms. She reports some associated shortness of breath. She is also dizzy especially with change in position. There has been no syncope or presyncope.    Past Medical History:  Diagnosis Date  . Basal cell carcinoma    removed  . Chronic back pain    stenosis  . Constipation    takes Colace daily  . Depression    takes Zoloft daily  . Diverticulosis of colon   . GERD (gastroesophageal reflux disease)    atypical chest pain  . Gout    hx of;was on Allopurinol but taken off 6 wks ago by medical MD  . History of diverticulitis of colon   . HLD (hyperlipidemia)    takes Pravastatin daily  . HTN (hypertension)    takes Amlodipine and Metoprolol daily  . OA  (osteoarthritis)    right knee  . Osteopenia    takes Calcium and Vit D daily  . Vertigo    takes Antivert daily as needed    Past Surgical History:  Procedure Laterality Date  . APPENDECTOMY  at age 78  . BACK SURGERY    . CATARACT EXTRACTION Right 04/2006  . CHOLECYSTECTOMY    . COLONOSCOPY    . KNEE SURGERY Right    Medial meniscus tear  . LUMBAR LAMINECTOMY/DECOMPRESSION MICRODISCECTOMY Bilateral 11/13/2014   Procedure: Lumbar one-two Bilateral laminectomy and foraminotomy, Left Lumbar one-two microdiscectomy ;  Surgeon: Faythe Ghee, MD;  Location: [place] [facility];  Service: Neurosurgery;  Laterality: Bilateral;  . NECK SURGERY     x 2;fusion  . PARTIAL COLECTOMY  09/1999   d/t diverticulitis  . TUBAL LIGATION       Current Outpatient Prescriptions  Medication Sig Dispense Refill  . amLODipine (NORVASC) 10 MG tablet TAKE 1 TABLET DAILY 90 tablet 3  . Calcium Carbonate-Vitamin D (CALCIUM 500 + D) 500-125 MG-UNIT TABS Take by mouth.    . Cranberry 250 MG CAPS Take 1 capsule by mouth daily.      Marland Kitchen docusate sodium (COLACE) 100 MG capsule Take 200 mg by mouth daily.      . fish oil-omega-3 fatty acids 1000 MG capsule Take 1 g by mouth 2 (two) times daily.     Marland Kitchen  furosemide (LASIX) 20 MG tablet Take one tablet by mouth daily as needed for swelling 90 tablet 3  . meclizine (ANTIVERT) 25 MG tablet Take 25 mg by mouth 4 (four) times daily as needed for dizziness.     . metoprolol succinate (TOPROL-XL) 25 MG 24 hr tablet Take 1 tablet (25 mg total) by mouth daily. 90 tablet 3  . polycarbophil (FIBERCON) 625 MG tablet Take 625 mg by mouth daily as needed for diarrhea or loose stools.     . pravastatin (PRAVACHOL) 20 MG tablet TAKE 1 TABLET DAILY 90 tablet 1  . sertraline (ZOLOFT) 50 MG tablet TAKE ONE AND ONE-HALF TABLETS DAILY 135 tablet 2   No current facility-administered medications for this visit.     Allergies:   Ace inhibitors; Cephalosporins; Colesevelam; Lovastatin;  Raloxifene; Rosuvastatin; and Simvastatin    Social History:  The patient  reports that she has never smoked. She has never used smokeless tobacco. She reports that she does not drink alcohol or use drugs.   Family History:  The patient's family history includes Alzheimer's disease in her mother; Cancer in her father; Diabetes in her father; Heart attack in her father; Hypertension in her father and mother; Stroke in her mother.    ROS:  Please see the history of present illness.   Otherwise, review of systems are positive for none.   All other systems are reviewed and negative.    PHYSICAL EXAM: VS:  BP (!) 162/60 (BP Location: Right Arm, Patient Position: Sitting, Cuff Size: Normal)   Pulse (!) 57   Ht 5\' 2"  (1.575 m)   Wt 152 lb 8 oz (69.2 kg)   BMI 27.89 kg/m  , BMI Body mass index is 27.89 kg/m. GEN: Well nourished, well developed, in no acute distress  HEENT: normal  Neck: no JVD, carotid bruits, or masses Cardiac: RRR; no rubs, or gallops,no edema . There is one out of 6 systolic ejection murmur at the base Respiratory:  clear to auscultation bilaterally, normal work of breathing GI: soft, nontender, nondistended, + BS MS: no deformity or atrophy  Skin: warm and dry, no rash Neuro:  Strength and sensation are intact Psych: euthymic mood, full affect   EKG:  EKG is ordered today. The ekg ordered today demonstrates sinus bradycardia with nonspecific ST changes.   Recent Labs: 02/23/2017: ALT 10; BUN 19; Creatinine, Ser 1.03; Hemoglobin 15.0; Platelets 318.0; Potassium 4.7; Sodium 140; TSH 2.73    Lipid Panel    Component Value Date/Time   CHOL 231 (H) 02/23/2017 1717   TRIG 120.0 02/23/2017 1717   HDL 65.70 02/23/2017 1717   CHOLHDL 4 02/23/2017 1717   VLDL 24.0 02/23/2017 1717   LDLCALC 141 (H) 02/23/2017 1717   LDLDIRECT 135.4 04/11/2013 1154      Wt Readings from Last 3 Encounters:  03/01/17 152 lb 8 oz (69.2 kg)  02/23/17 144 lb 8 oz (65.5 kg)  07/13/16  160 lb (72.6 kg)       PAD Screen 03/01/2017  Previous PAD dx? No  Previous surgical procedure? No  Pain with walking? No  Feet/toe relief with dangling? No  Painful, non-healing ulcers? No  Extremities discolored? No      ASSESSMENT AND PLAN:  1.  Unstable angina: The patient describes new onset exertional chest pain currently happening with minimal activities. Fortunately, EKG does not show significant ischemic changes other than nonspecific ST changes. I discussed with her different management options and given her age, she prefers avoiding  invasive procedures if possible. I think it's reasonable to try antianginal therapy and reevaluate her response. I asked her to start taking aspirin 81 mg once daily. I also elected to switch metoprolol to carvedilol 3.125 mg twice daily given underlying bradycardia. I added Imdur 30 mg once daily and prescribed sublingual nitroglycerin. Reevaluate in 2 weeks. If no improvement in symptoms, stress testing or cardiac catheterization can be considered.  2. Bradycardia: Metoprolol was switched to carvedilol as outlined above. Recent labs were unremarkable including TSH.  3. Essential hypertension: Blood pressure has been elevated. I made some changes as outlined above.  4. Hyperlipidemia: Currently on pravastatin 20 mg daily.    Disposition:   FU with me in 2 weeks  Signed,  Kathlyn Sacramento, MD  03/01/2017 3:33 PM    [place]

## 2017-03-01 NOTE — Patient Instructions (Addendum)
Medication Instructions:  Your physician has recommended you make the following change in your medication:  STOP taking metoprolol START taking coreg 3.125mg  twice daily START taking imdur 30mg  once daily START taking 81mg  aspirin one daily You may take nitroglycerin as needed for chest pain. If you are experiencing chest pain, you may place one nitro tablet under your tongue. If you still have chest pain after 5 minutes, take another nitro tablet. If pain still unresolved after three tablets, please call 911 or proceed to the nearest ED. *Nitroglycerin tablets may cause a headache and will also lower your blood pressure.    Labwork: none  Testing/Procedures: none  Follow-Up: Your physician recommends that you schedule a follow-up appointment in: 2 weeks with Dr. Fletcher Anon.    Any Other Special Instructions Will Be Listed Below (If Applicable).     If you need a refill on your cardiac medications before your next appointment, please call your pharmacy.

## 2017-03-01 NOTE — Telephone Encounter (Signed)
Pharmacy calling stating they can't fill any of the medications for we sent them to mail order   They need [place] to cancel the one for that and then call them once we have done so   Please advise

## 2017-03-02 ENCOUNTER — Ambulatory Visit: Payer: Medicare Other

## 2017-03-02 NOTE — Telephone Encounter (Signed)
Spoke with [facility] with [facility] regarding refills for carvedilol, Isosorbide and NTG that were sent in yesterday. Linda Robinson has cancelled the refills for the above medications and no charge to the patient will be processed. Spoke with the pharmacist at [facility] with the update that Rx's have been cancelled through [facility] and they can now process the Rx's through [facility].

## 2017-03-05 ENCOUNTER — Emergency Department (HOSPITAL_COMMUNITY): Payer: Medicare Other

## 2017-03-05 ENCOUNTER — Observation Stay (HOSPITAL_COMMUNITY)
Admission: EM | Admit: 2017-03-05 | Discharge: 2017-03-07 | Disposition: A | Discharge status: Home / Self Care | Payer: Medicare Other | Attending: Internal Medicine | Admitting: Internal Medicine

## 2017-03-05 ENCOUNTER — Encounter (HOSPITAL_COMMUNITY): Payer: Self-pay

## 2017-03-05 DIAGNOSIS — R072 Precordial pain: Secondary | ICD-10-CM

## 2017-03-05 DIAGNOSIS — R001 Bradycardia, unspecified: Secondary | ICD-10-CM | POA: Insufficient documentation

## 2017-03-05 DIAGNOSIS — E785 Hyperlipidemia, unspecified: Secondary | ICD-10-CM | POA: Diagnosis present

## 2017-03-05 DIAGNOSIS — M8580 Other specified disorders of bone density and structure, unspecified site: Secondary | ICD-10-CM | POA: Insufficient documentation

## 2017-03-05 DIAGNOSIS — N289 Disorder of kidney and ureter, unspecified: Secondary | ICD-10-CM | POA: Diagnosis not present

## 2017-03-05 DIAGNOSIS — R079 Chest pain, unspecified: Secondary | ICD-10-CM

## 2017-03-05 DIAGNOSIS — I10 Essential (primary) hypertension: Secondary | ICD-10-CM | POA: Insufficient documentation

## 2017-03-05 DIAGNOSIS — R0789 Other chest pain: Secondary | ICD-10-CM | POA: Diagnosis not present

## 2017-03-05 DIAGNOSIS — N179 Acute kidney failure, unspecified: Secondary | ICD-10-CM | POA: Diagnosis not present

## 2017-03-05 DIAGNOSIS — I700 Atherosclerosis of aorta: Secondary | ICD-10-CM | POA: Insufficient documentation

## 2017-03-05 DIAGNOSIS — N1832 Chronic kidney disease, stage 3b: Secondary | ICD-10-CM

## 2017-03-05 DIAGNOSIS — Z9049 Acquired absence of other specified parts of digestive tract: Secondary | ICD-10-CM | POA: Diagnosis not present

## 2017-03-05 DIAGNOSIS — Z981 Arthrodesis status: Secondary | ICD-10-CM | POA: Insufficient documentation

## 2017-03-05 DIAGNOSIS — R42 Dizziness and giddiness: Secondary | ICD-10-CM | POA: Insufficient documentation

## 2017-03-05 DIAGNOSIS — Z7982 Long term (current) use of aspirin: Secondary | ICD-10-CM | POA: Diagnosis not present

## 2017-03-05 DIAGNOSIS — K5900 Constipation, unspecified: Secondary | ICD-10-CM | POA: Diagnosis not present

## 2017-03-05 LAB — BASIC METABOLIC PANEL
Anion gap: 11 (ref 5–15)
BUN: 17 mg/dL (ref 6–20)
CHLORIDE: 101 mmol/L (ref 101–111)
CO2: 25 mmol/L (ref 22–32)
CREATININE: 1.14 mg/dL — AB (ref 0.44–1.00)
Calcium: 9.3 mg/dL (ref 8.9–10.3)
GFR calc non Af Amer: 42 mL/min — ABNORMAL LOW (ref >60)
GFR, EST AFRICAN AMERICAN: 49 mL/min — AB (ref >60)
Glucose, Bld: 158 mg/dL — ABNORMAL HIGH (ref 65–99)
POTASSIUM: 3.5 mmol/L (ref 3.5–5.1)
SODIUM: 137 mmol/L (ref 135–145)

## 2017-03-05 LAB — CBC
HCT: 39 % (ref 36.0–46.0)
Hemoglobin: 13.2 g/dL (ref 12.0–15.0)
MCH: 29.3 pg (ref 26.0–34.0)
MCHC: 33.8 g/dL (ref 30.0–36.0)
MCV: 86.5 fL (ref 78.0–100.0)
PLATELETS: 277 10*3/uL (ref 150–400)
RBC: 4.51 MIL/uL (ref 3.87–5.11)
RDW: 13.2 % (ref 11.5–15.5)
WBC: 10.1 10*3/uL (ref 4.0–10.5)

## 2017-03-05 LAB — I-STAT TROPONIN, ED: TROPONIN I, POC: 0.01 ng/mL (ref 0.00–0.08)

## 2017-03-05 LAB — TROPONIN I

## 2017-03-05 MED ORDER — GI COCKTAIL ~~LOC~~: 30.0000 mL | Freq: Four times a day (QID) | ORAL | Status: DC | PRN

## 2017-03-05 MED ORDER — ENOXAPARIN SODIUM 40 MG/0.4ML ~~LOC~~ SOLN
40.0000 mg | Freq: Every day | SUBCUTANEOUS | Status: DC
  Administered 2017-03-06 – 2017-03-07 (×2): 40 mg via SUBCUTANEOUS
  Filled 2017-03-05 (×2): qty 0.4

## 2017-03-05 MED ORDER — MECLIZINE HCL 25 MG PO TABS
25.0000 mg | ORAL_TABLET | Freq: Four times a day (QID) | ORAL | Status: DC | PRN
Start: 2017-03-05 — End: 2017-03-07

## 2017-03-05 MED ORDER — ASPIRIN EC 81 MG PO TBEC
81.0000 mg | DELAYED_RELEASE_TABLET | Freq: Every day | ORAL | Status: DC
  Administered 2017-03-06 – 2017-03-07 (×2): 81 mg via ORAL
  Filled 2017-03-05 (×2): qty 1

## 2017-03-05 MED ORDER — POLYVINYL ALCOHOL 1.4 % OP SOLN: 1.0000 [drp] | OPHTHALMIC | Status: DC | PRN

## 2017-03-05 MED ORDER — DOCUSATE SODIUM 100 MG PO CAPS: 100.0000 mg | ORAL_CAPSULE | Freq: Two times a day (BID) | ORAL | Status: DC

## 2017-03-05 MED ORDER — NITROGLYCERIN 2 % TD OINT
1.0000 [in_us] | TOPICAL_OINTMENT | Freq: Once | TRANSDERMAL | Status: AC
  Administered 2017-03-05: 1 [in_us] via TOPICAL
  Filled 2017-03-05: qty 1

## 2017-03-05 MED ORDER — CARVEDILOL 3.125 MG PO TABS: 3.1250 mg | ORAL_TABLET | Freq: Two times a day (BID) | ORAL | Status: DC

## 2017-03-05 MED ORDER — PRAVASTATIN SODIUM 40 MG PO TABS
20.0000 mg | ORAL_TABLET | Freq: Every day | ORAL | Status: DC
  Administered 2017-03-06: 20 mg via ORAL
  Filled 2017-03-05: qty 1

## 2017-03-05 MED ORDER — CARVEDILOL 3.125 MG PO TABS
3.1250 mg | ORAL_TABLET | Freq: Two times a day (BID) | ORAL | Status: DC
  Administered 2017-03-06 – 2017-03-07 (×3): 3.125 mg via ORAL
  Filled 2017-03-05 (×3): qty 1

## 2017-03-05 MED ORDER — DOCUSATE SODIUM 100 MG PO CAPS
200.0000 mg | ORAL_CAPSULE | Freq: Every day | ORAL | Status: DC
  Administered 2017-03-06 (×2): 200 mg via ORAL
  Filled 2017-03-05 (×2): qty 2

## 2017-03-05 MED ORDER — ACETAMINOPHEN 325 MG PO TABS: 650.0000 mg | ORAL_TABLET | ORAL | Status: DC | PRN

## 2017-03-05 MED ORDER — DOCUSATE SODIUM 100 MG PO CAPS
100.0000 mg | ORAL_CAPSULE | Freq: Every day | ORAL | Status: DC
  Administered 2017-03-06 – 2017-03-07 (×2): 100 mg via ORAL
  Filled 2017-03-05 (×2): qty 1

## 2017-03-05 MED ORDER — PANTOPRAZOLE SODIUM 40 MG PO TBEC
40.0000 mg | DELAYED_RELEASE_TABLET | Freq: Every day | ORAL | Status: DC
  Administered 2017-03-06 – 2017-03-07 (×3): 40 mg via ORAL
  Filled 2017-03-05 (×3): qty 1

## 2017-03-05 MED ORDER — FUROSEMIDE 20 MG PO TABS
20.0000 mg | ORAL_TABLET | Freq: Every day | ORAL | Status: DC
  Administered 2017-03-06 – 2017-03-07 (×2): 20 mg via ORAL
  Filled 2017-03-05 (×2): qty 1

## 2017-03-05 MED ORDER — MORPHINE SULFATE (PF) 4 MG/ML IV SOLN: 2.0000 mg | INTRAVENOUS | Status: DC | PRN

## 2017-03-05 MED ORDER — NITROGLYCERIN IN D5W 200-5 MCG/ML-% IV SOLN: 0.0000 ug/min | Freq: Once | INTRAVENOUS | Status: DC

## 2017-03-05 MED ORDER — NITROGLYCERIN 0.4 MG SL SUBL: 0.4000 mg | SUBLINGUAL_TABLET | SUBLINGUAL | Status: DC | PRN

## 2017-03-05 MED ORDER — AMLODIPINE BESYLATE 5 MG PO TABS
10.0000 mg | ORAL_TABLET | Freq: Every day | ORAL | Status: DC
  Administered 2017-03-06 – 2017-03-07 (×2): 10 mg via ORAL
  Filled 2017-03-05 (×2): qty 2

## 2017-03-05 MED ORDER — ONDANSETRON HCL 4 MG/2ML IJ SOLN: 4.0000 mg | Freq: Four times a day (QID) | INTRAMUSCULAR | Status: DC | PRN

## 2017-03-05 MED ORDER — ISOSORBIDE MONONITRATE ER 30 MG PO TB24
30.0000 mg | ORAL_TABLET | Freq: Every day | ORAL | Status: DC
  Administered 2017-03-06 – 2017-03-07 (×2): 30 mg via ORAL
  Filled 2017-03-05 (×2): qty 1

## 2017-03-05 MED ORDER — SERTRALINE HCL 50 MG PO TABS
75.0000 mg | ORAL_TABLET | Freq: Every day | ORAL | Status: DC
  Administered 2017-03-06: 75 mg via ORAL
  Filled 2017-03-05 (×2): qty 1

## 2017-03-05 NOTE — ED Provider Notes (Signed)
Emergency Department Provider Note   I have reviewed the triage vital signs and the nursing notes.   HISTORY  Chief Complaint Chest Pain   HPI Linda Robinson is a 83 y.o. female with PMH of HLD, HTN, GERD presents to the emergency department for evaluation of worsening chest discomfort and lightheadedness. Symptoms have been intermittent for the past 1-2 weeks. She saw a cardiologist in the office 5 days ago and was prescribed antianginal medications. Today she went to the grocery store and her chest discomfort worsen significantly. It is centered is slightly right of her chest feels like a pressure. No modifying factors. No associated dyspnea. When symptoms became much worse and lightheadedness developed she called EMS and took a full dose aspirin. She was given 2 nitroglycerin and pain improved but then returned shortly afterwards. She continues to have some mild right-sided chest pain. No radiation.    Past Medical History:  Diagnosis Date  . Basal cell carcinoma    removed  . Chronic back pain    stenosis  . Constipation    takes Colace daily  . Depression    takes Zoloft daily  . Diverticulosis of colon   . GERD (gastroesophageal reflux disease)    atypical chest pain  . Gout    hx of;was on Allopurinol but taken off 6 wks ago by medical MD  . History of diverticulitis of colon   . HLD (hyperlipidemia)    takes Pravastatin daily  . HTN (hypertension)    takes Amlodipine and Metoprolol daily  . OA (osteoarthritis)    right knee  . Osteopenia    takes Calcium and Vit D daily  . Vertigo    takes Antivert daily as needed    Patient Active Problem List   Diagnosis Date Noted  . Chest pain 03/05/2017  . Weight loss 02/23/2017  . Chest discomfort 02/23/2017  . Dizziness 05/19/2016  . Routine general medical examination at a health care facility 07/05/2015  . Estrogen deficiency 07/05/2015  . Hyperglycemia 01/01/2015  . Lumbar spinal stenosis 11/13/2014  .  Vertigo 10/05/2014  . Elevated transaminase level 07/16/2014  . Leukocytosis 07/16/2014  . Risk for falls 06/27/2014  . Low back pain on right side with sciatica 07/24/2013  . Edema 04/26/2013  . Encounter for Medicare annual wellness exam 04/11/2013  . Bradycardia 05/19/2010  . Disorder of bone and cartilage 01/30/2009  . CONSTIPATION 11/01/2007  . Depression with anxiety 02/14/2007  . Hyperlipidemia LDL goal <130 02/04/2007  . Gout 02/04/2007  . Essential hypertension 02/04/2007  . DIVERTICULOSIS, COLON 02/04/2007  . Renal insufficiency 02/04/2007  . OSTEOARTHRITIS 02/04/2007  . [place] DISEASE 02/04/2007  . LEG CRAMPS 02/04/2007    Past Surgical History:  Procedure Laterality Date  . APPENDECTOMY  at age 73  . BACK SURGERY    . CATARACT EXTRACTION Right 04/2006  . CHOLECYSTECTOMY    . COLONOSCOPY    . KNEE SURGERY Right    Medial meniscus tear  . LUMBAR LAMINECTOMY/DECOMPRESSION MICRODISCECTOMY Bilateral 11/13/2014   Procedure: Lumbar one-two Bilateral laminectomy and foraminotomy, Left Lumbar one-two microdiscectomy ;  Surgeon: Faythe Ghee, MD;  Location: [facility];  Service: Neurosurgery;  Laterality: Bilateral;  . NECK SURGERY     x 2;fusion  . PARTIAL COLECTOMY  09/1999   d/t diverticulitis  . TUBAL LIGATION        Allergies Ace inhibitors; Cephalosporins; Colesevelam; Lovastatin; Raloxifene; Rosuvastatin; and Simvastatin  Family History  Problem Relation Age of  Onset  . Cancer Father        throat and bladder  . Diabetes Father   . Hypertension Father   . Heart attack Father   . Stroke Mother   . Alzheimer's disease Mother   . Hypertension Mother     Social History Social History  Substance Use Topics  . Smoking status: Never Smoker  . Smokeless tobacco: Never Used  . Alcohol use No    Review of Systems  Constitutional: No fever/chills Eyes: No visual changes. ENT: No sore throat. Cardiovascular: Positive chest  pain. Respiratory: Denies shortness of breath. Gastrointestinal: No abdominal pain.  No nausea, no vomiting.  No diarrhea.  No constipation. Genitourinary: Negative for dysuria. Musculoskeletal: Negative for back pain. Skin: Negative for rash. Neurological: Negative for headaches, focal weakness or numbness.  10-point ROS otherwise negative.  ____________________________________________   PHYSICAL EXAM:  VITAL SIGNS: Vitals:   03/05/17 2243 03/06/17 0652  BP: 138/65 (!) 154/71  Pulse: 64 70  Resp: 20   Temp: 98.7 F (37.1 C) 97.8 F (36.6 C)   Constitutional: Alert and oriented. Well appearing and in no acute distress. Eyes: Conjunctivae are normal.  Head: Atraumatic. Nose: No congestion/rhinnorhea. Mouth/Throat: Mucous membranes are moist.  Oropharynx non-erythematous. Neck: No stridor.  Cardiovascular: Normal rate, regular rhythm. Good peripheral circulation. Grossly normal heart sounds.   Respiratory: Normal respiratory effort.  No retractions. Lungs CTAB. Gastrointestinal: Soft and nontender. No distention.  Musculoskeletal: No lower extremity tenderness nor edema. No gross deformities of extremities. Positive tenderness to palpation of the right anterior chest wall.  Neurologic:  Normal speech and language. No gross focal neurologic deficits are appreciated.  Skin:  Skin is warm, dry and intact. No rash noted.  ____________________________________________   LABS (all labs ordered are listed, but only abnormal results are displayed)  Labs Reviewed  BASIC METABOLIC PANEL - Abnormal; Notable for the following:       Result Value   Glucose, Bld 158 (*)    Creatinine, Ser 1.14 (*)    GFR calc non Af Amer 42 (*)    GFR calc Af Amer 49 (*)    All other components within normal limits  CBC WITH DIFFERENTIAL/PLATELET - Abnormal; Notable for the following:    Lymphs Abs 5.1 (*)    All other components within normal limits  BASIC METABOLIC PANEL - Abnormal; Notable for  the following:    Creatinine, Ser 1.06 (*)    GFR calc non Af Amer 46 (*)    GFR calc Af Amer 54 (*)    All other components within normal limits  CBC  TROPONIN I  TROPONIN I  TROPONIN I  I-STAT TROPOININ, ED   ____________________________________________  EKG   EKG Interpretation  Date/Time:  Friday March 05 2017 19:01:27 EDT Ventricular Rate:  74 PR Interval:    QRS Duration: 93 QT Interval:  416 QTC Calculation: 462 R Axis:   46 Text Interpretation:  Sinus rhythm Minimal ST depression, lateral leads No STEMI. Similar to prior.  Confirmed by Nanda Quinton [id number]) on 03/05/2017 7:17:15 PM       ____________________________________________  RADIOLOGY  Dg Chest 2 View  Result Date: 03/05/2017 CLINICAL DATA:  chest pain. Pt stated that she's been having chest pains off and on for 1 week. She said it started in the middle of her chest, but now hurts in her right side chest. No sob or other pains. Hx of HTN EXAM: CHEST  2 VIEW COMPARISON:  02/23/2017 FINDINGS:  moderate thoracic spondylosis. Lower cervical spine fixation. Numerous leads and wires project over the chest. Midline trachea. Borderline cardiomegaly. Atherosclerosis in the transverse aorta. No pleural effusion or pneumothorax. Mild scarring in the lingula. IMPRESSION: No acute cardiopulmonary disease. Aortic atherosclerosis. Electronically Signed   By: Abigail Miyamoto M.D.   On: 03/05/2017 19:28    ____________________________________________   PROCEDURES  Procedure(s) performed:   Procedures  None ____________________________________________   INITIAL IMPRESSION / ASSESSMENT AND PLAN / ED COURSE  Pertinent labs & imaging results that were available during my care of the patient were reviewed by me and considered in my medical decision making (see chart for details).  Patient presents to the emergency department for evaluation of chest discomfort intermittently for the past 2 weeks but worsened significantly  today with lightheadedness. She saw Dr. Fletcher Anon with Cardiology in office on 03/01/2017 and diagnosed with unstable angina but due to age decided to try anti-anginal meds first. Patient continues to have mild chest pain. Will apply a nitroglycerin ointment. Chest x-ray and troponin from triage are normal. The patient has some lateral ST depressions that appear to be present in prior EKGs. Plan to discuss the case with cardiology given her recent evaluation in office for unstable angina.   08:24 PM Spoke with cardiology about the patient. They'll be down to see the patient emergency department for likely admission. Their preference would be to start a nitroglycerin drip. I reassessed the patient and she is currently chest pain-free with a true glycerin ointment. Plan to hold the drip for now until cardiology see the patient.  Discussed patient's case with Hospitalist, Dr. Tamala Julian. Patient and family (if present) updated with plan. Care transferred to hospitalist service.  I reviewed all nursing notes, vitals, pertinent old records, EKGs, labs, imaging (as available).  ____________________________________________  FINAL CLINICAL IMPRESSION(S) / ED DIAGNOSES  Final diagnoses:  Precordial chest pain     MEDICATIONS GIVEN DURING THIS VISIT:  Medications  polyvinyl alcohol (LIQUIFILM TEARS) 1.4 % ophthalmic solution 1-2 drop (not administered)  aspirin EC tablet 81 mg (not administered)  nitroGLYCERIN (NITROSTAT) SL tablet 0.4 mg (not administered)  amLODipine (NORVASC) tablet 10 mg (not administered)  furosemide (LASIX) tablet 20 mg (not administered)  meclizine (ANTIVERT) tablet 25 mg (not administered)  pravastatin (PRAVACHOL) tablet 20 mg (20 mg Oral Given 03/06/17 0009)  sertraline (ZOLOFT) tablet 75 mg (not administered)  isosorbide mononitrate (IMDUR) 24 hr tablet 30 mg (not administered)  acetaminophen (TYLENOL) tablet 650 mg (not administered)  ondansetron (ZOFRAN) injection 4 mg (not  administered)  enoxaparin (LOVENOX) injection 40 mg (not administered)  morphine 4 MG/ML injection 2 mg (not administered)  gi cocktail (Maalox,Lidocaine,Donnatal) (not administered)  pantoprazole (PROTONIX) EC tablet 40 mg (40 mg Oral Given 03/06/17 0010)  docusate sodium (COLACE) capsule 100 mg (not administered)    And  docusate sodium (COLACE) capsule 200 mg (200 mg Oral Given 03/06/17 0010)  carvedilol (COREG) tablet 3.125 mg (not administered)  0.9 %  sodium chloride infusion ( Intravenous New Bag/Given 03/06/17 0115)  nitroGLYCERIN (NITROGLYN) 2 % ointment 1 inch (1 inch Topical Given 03/05/17 1956)     NEW OUTPATIENT MEDICATIONS STARTED DURING THIS VISIT:  None   Note:  This document was prepared using Dragon voice recognition software and may include unintentional dictation errors.  Nanda Quinton, MD Emergency Medicine  Daphane Odekirk, [facility], MD 03/06/17 [phone]

## 2017-03-05 NOTE — Consult Note (Signed)
CARDIOLOGY CONSULT NOTE   Referring Physician:  Primary Physician: Primary Cardiologist: Reason for Consultation:    HPI: 83 y.o. female w/ HTN, HLD, back pain, bradycardia presenting with chest pain.   Patient reports ~2 weeks of intermittent chest discomfort. She has never had chest symptoms before this time. Reports aching chest pain in the Right parasternum and right mid-clavicular chest at the level of the 3rd rib. The pain is non-exertional, non-radiating, not associated with changes in position, and has been intermittent every day for the past several weeks. She does report having had a fall at home about 2 to 3 weeks ago. She struck her head/face when she fell but does not recall striking her chest. She did not develop pain immediately after this fall, but rather several days later. She has not been able to identify any aggravating or alleviating factors.   Linda Robinson is mostly sedentary at home due to back and knee pain that she has dealt with for years. The most exertional activity she performs is cooking or doing her dishes at home. She does not go up and down stairs. She is able to walk around the grocery store and notes that her pain did not worsen with this, but rather she developed pain while in the car on the ride home.   She was seen by a cardiologist within the last week and concern was expressed for possible unstable angina. She was started on anti-ischemic medications in hopes to delay invasive angiography if possible. Her symptoms however persisted thus she presented to the ED. She was given a full dose aspirin as well as a nitroglycerin patch. By the time I saw her in the ED her chest pain was 0/10 and she was resting comfortably.   Linda Robinson has no other symptoms associated with her chest pain. She denies left sided chest pain, shortness of breath, orthopnea, lower extremity edema, palpitations, PND.   Review of Systems:     Cardiac Review of Systems: {Y] = yes [ ]  =  no  Chest Pain [  X  ]  Resting SOB [   ] Exertional SOB  [  ]  Orthopnea [  ]   Pedal Edema [   ]    Palpitations [  ] Syncope  [  ]   Presyncope [   ]  General Review of Systems: [Y] = yes [  ]=no Constitional: recent weight change [  ]; anorexia [  ]; fatigue [  ]; nausea [  ]; night sweats [  ]; fever [  ]; or chills [  ];                                                                     Eyes : blurred vision [  ]; diplopia [   ]; vision changes [  ];  Amaurosis fugax[  ]; Resp: cough [  ];  wheezing[  ];  hemoptysis[  ];  PND [  ];  GI:  gallstones[  ], vomiting[  ];  dysphagia[  ]; melena[  ];  hematochezia [  ]; heartburn[  ];   GU: kidney stones [  ]; hematuria[  ];   dysuria [  ];  nocturia[  ]; incontinence [  ];  Skin: rash, swelling[  ];, hair loss[  ];  peripheral edema[  ];  or itching[  ]; Musculosketetal: myalgias[  ];  joint swelling[  ];  joint erythema[  ];  joint pain[  ];  back pain[  ];  Heme/Lymph: bruising[  ];  bleeding[  ];  anemia[  ];  Neuro: TIA[  ];  headaches[  ];  stroke[  ];  vertigo[  ];  seizures[  ];   paresthesias[  ];  difficulty walking[  ];  Psych:depression[  ]; anxiety[  ];  Endocrine: diabetes[  ];  thyroid dysfunction[  ];  Other:  Past Medical History:  Diagnosis Date  . Basal cell carcinoma    removed  . Chronic back pain    stenosis  . Constipation    takes Colace daily  . Depression    takes Zoloft daily  . Diverticulosis of colon   . GERD (gastroesophageal reflux disease)    atypical chest pain  . Gout    hx of;was on Allopurinol but taken off 6 wks ago by medical MD  . History of diverticulitis of colon   . HLD (hyperlipidemia)    takes Pravastatin daily  . HTN (hypertension)    takes Amlodipine and Metoprolol daily  . OA (osteoarthritis)    right knee  . Osteopenia    takes Calcium and Vit D daily  . Vertigo    takes Antivert daily as needed    Allergies  Allergen Reactions  . Ace Inhibitors     REACTION:  increased K+, increased creat.  . Cephalosporins     REACTION: reaction not known  . Colesevelam     REACTION: constipation  . Lovastatin     REACTION: doesn't work  . Raloxifene     REACTION: cramps  . Rosuvastatin     REACTION: myalgia  . Simvastatin     REACTION: myalgia    Social History   Social History  . Marital status: Married    Spouse name: N/A  . Number of children: N/A  . Years of education: N/A   Occupational History  . Retired    Social History Main Topics  . Smoking status: Never Smoker  . Smokeless tobacco: Never Used  . Alcohol use No  . Drug use: No  . Sexual activity: No   Other Topics Concern  . Not on file   Social History Narrative  . No narrative on file    Family History  Problem Relation Age of Onset  . Cancer Father        throat and bladder  . Diabetes Father   . Hypertension Father   . Heart attack Father   . Stroke Mother   . Alzheimer's disease Mother   . Hypertension Mother     PHYSICAL EXAM: Vitals:   03/05/17 1930 03/05/17 1945  BP: 126/67 (!) 118/96  Pulse: 73 79  Resp: (!) 23 (!) 30  Temp:      No intake or output data in the 24 hours ending 03/05/17 2058  General:  Well appearing, anxious, laying flat in bed HEENT: normal Neck: supple. no JVD. Cor: PMI nondisplaced. Regular rate & rhythm. No rubs, gallops or murmurs. Chest pain easily reproduced with palpation over the right precordium.  Lungs: clear Abdomen: soft, nontender, nondistended. No hepatosplenomegaly. No bruits or masses. Good bowel sounds. Extremities: no cyanosis, clubbing, rash, edema Neuro: alert & oriented x 3, cranial nerves grossly intact. moves all 4 extremities w/o difficulty. Affect  pleasant.  ECG: NSR, HR 74, normal axis, <62mm ST deviation in the antero-apical precordial leads, perhaps subtly different than baseline  Results for orders placed or performed during the hospital encounter of 03/05/17 (from the past 24 hour(s))  Basic  metabolic panel     Status: Abnormal   Collection Time: 03/05/17  7:02 PM  Result Value Ref Range   Sodium 137 135 - 145 mmol/L   Potassium 3.5 3.5 - 5.1 mmol/L   Chloride 101 101 - 111 mmol/L   CO2 25 22 - 32 mmol/L   Glucose, Bld 158 (H) 65 - 99 mg/dL   BUN 17 6 - 20 mg/dL   Creatinine, Ser 1.14 (H) 0.44 - 1.00 mg/dL   Calcium 9.3 8.9 - 10.3 mg/dL   GFR calc non Af Amer 42 (L) >60 mL/min   GFR calc Af Amer 49 (L) >60 mL/min   Anion gap 11 5 - 15  CBC     Status: None   Collection Time: 03/05/17  7:02 PM  Result Value Ref Range   WBC 10.1 4.0 - 10.5 K/uL   RBC 4.51 3.87 - 5.11 MIL/uL   Hemoglobin 13.2 12.0 - 15.0 g/dL   HCT 39.0 36.0 - 46.0 %   MCV 86.5 78.0 - 100.0 fL   MCH 29.3 26.0 - 34.0 pg   MCHC 33.8 30.0 - 36.0 g/dL   RDW 13.2 11.5 - 15.5 %   Platelets 277 150 - 400 K/uL  I-stat troponin, ED     Status: None   Collection Time: 03/05/17  7:16 PM  Result Value Ref Range   Troponin i, poc 0.01 0.00 - 0.08 ng/mL   Comment 3           Dg Chest 2 View  Result Date: 03/05/2017 CLINICAL DATA:  chest pain. Pt stated that she's been having chest pains off and on for 1 week. She said it started in the middle of her chest, but now hurts in her right side chest. No sob or other pains. Hx of HTN EXAM: CHEST  2 VIEW COMPARISON:  02/23/2017 FINDINGS: moderate thoracic spondylosis. Lower cervical spine fixation. Numerous leads and wires project over the chest. Midline trachea. Borderline cardiomegaly. Atherosclerosis in the transverse aorta. No pleural effusion or pneumothorax. Mild scarring in the lingula. IMPRESSION: No acute cardiopulmonary disease. Aortic atherosclerosis. Electronically Signed   By: Abigail Miyamoto M.D.   On: 03/05/2017 19:28    ASSESSMENT: Linda Robinson is an 83 y.o. female w/ several cardiac risk factors (HTN, HLD, age, family history) who presents with chest pain. Her symptoms are somewhat atypical and notably her pain is easily reproduced with palpation over the right  precordium at the mid-clavicular line 3rd/4th interspace. She has had one negative troponin so far. Although it is certainly possible that her symptoms represent ischemic chest pain, I think that a musculoskeletal etiology such as costochondritis is potentially more likely.   PLAN/DISCUSSION: - recommend upright PA + lateral chest x-ray to evaluate for rib fracture - agree with full dose aspirin followed by 81mg  daily - would hold off on starting heparin drip for now, though reasonable to start if pain symptoms recur - admit to telemetry - please obtain another troponin value 6 hours after initial value - SL nitro PRN - consider trial of tylenol for pain  - cardiology will see in AM, may be reasonable to consider either stress testing or coronary CTA to rule out/in prior to invasive angiography  Marcie Mowers,  MD Cardiology Fellow

## 2017-03-05 NOTE — ED Triage Notes (Signed)
Pt from home with [facility] c.o central no radiating CP that started about 2 weeks ago but today it became worse while pt was in the grocery store. Pt sts she did feel dizzy and lightheaded when it happened but no LOC. Pt denies N/V. Pt given 324 ASA  And 1 Nitro. Pain now 2/10. 20G L AC, VSS 148/94 72HR Sinus. Pt a.o, nad at this time

## 2017-03-05 NOTE — H&P (Signed)
History and Physical    Linda Robinson HYQ:[id number] DOB: [birth year] DOA: 03/05/2017  Referring MD/NP/PA: Dr. Laverta Baltimore PCP: Glori Bickers Wynelle Fanny, MD  Patient coming from: Home via EMS  Chief Complaint: Chest pain  HPI: Linda Robinson is a 83 y.o. female with medical history significant of HTN, HLD, GERD; who presents with reports of right-sided chest pain over the last 2-3 days. Today, while riding in the car from the grocery store patient reports right substernal chest pain that she describes as a dull and heavy pain began. Associated symptoms included feelings of lightheadedness, palpitations, belching, and diaphoresis. She chronically has low back pain after having laminectomy lumbar spine. Denies any change in weight, loss of consciousness, strenuous activity, recent travel, leg swelling, calf pain, nausea, vomiting, shortness of breath, or cough. She is been having similar pains that previously were more centralized intermittently for the last 2 weeks. She was evaluated by her primary care provider who sent her to cardiology for days ago. Patient was seen by Dr. Fletcher Anon cardiology who questionws the possibility of unstable angina. He's switched the patient from metoprolol to Coreg, added Imdur, and nitroglycerin as needed. Once patient returned home she tried taking 2 nitroglycerin without any relief of symptoms. She took 4 baby aspirin and chew them.  ED Course: Upon admission into the emergency department patient was seen to be afebrile, respirations 21-30, and all other vital signs relatively within normal limits. Labs revealed BUN 17, creatinine 1.14, troponin 0.01. Cardiology was called and recommended cycling cardiac enzymes overnight and possible stress test in a.m.  Review of Systems: As per HPI otherwise 10 point review of systems negative.   Past Medical History:  Diagnosis Date  . Basal cell carcinoma    removed  . Chronic back pain    stenosis  . Constipation    takes Colace daily  .  Depression    takes Zoloft daily  . Diverticulosis of colon   . GERD (gastroesophageal reflux disease)    atypical chest pain  . Gout    hx of;was on Allopurinol but taken off 6 wks ago by medical MD  . History of diverticulitis of colon   . HLD (hyperlipidemia)    takes Pravastatin daily  . HTN (hypertension)    takes Amlodipine and Metoprolol daily  . OA (osteoarthritis)    right knee  . Osteopenia    takes Calcium and Vit D daily  . Vertigo    takes Antivert daily as needed    Past Surgical History:  Procedure Laterality Date  . APPENDECTOMY  at age 79  . BACK SURGERY    . CATARACT EXTRACTION Right 04/2006  . CHOLECYSTECTOMY    . COLONOSCOPY    . KNEE SURGERY Right    Medial meniscus tear  . LUMBAR LAMINECTOMY/DECOMPRESSION MICRODISCECTOMY Bilateral 11/13/2014   Procedure: Lumbar one-two Bilateral laminectomy and foraminotomy, Left Lumbar one-two microdiscectomy ;  Surgeon: Faythe Ghee, MD;  Location: [facility];  Service: Neurosurgery;  Laterality: Bilateral;  . NECK SURGERY     x 2;fusion  . PARTIAL COLECTOMY  09/1999   d/t diverticulitis  . TUBAL LIGATION       reports that she has never smoked. She has never used smokeless tobacco. She reports that she does not drink alcohol or use drugs.  Allergies  Allergen Reactions  . Ace Inhibitors     REACTION: increased K+, increased creat.  . Cephalosporins     REACTION: reaction not known  . Colesevelam  REACTION: constipation  . Lovastatin     REACTION: doesn't work  . Raloxifene     REACTION: cramps  . Rosuvastatin     REACTION: myalgia  . Simvastatin     REACTION: myalgia    Family History  Problem Relation Age of Onset  . Cancer Father        throat and bladder  . Diabetes Father   . Hypertension Father   . Heart attack Father   . Stroke Mother   . Alzheimer's disease Mother   . Hypertension Mother     Prior to Admission medications   Medication Sig Start Date End Date Taking?  Authorizing Provider  amLODipine (NORVASC) 10 MG tablet TAKE 1 TABLET DAILY 08/12/16  Yes Tower, Wynelle Fanny, MD  aspirin EC 81 MG tablet Take 1 tablet (81 mg total) by mouth daily. 03/01/17  Yes Wellington Hampshire, MD  Calcium Carbonate-Vitamin D (CALCIUM 500 + D) 500-125 MG-UNIT TABS Take by mouth.   Yes [provider]  carvedilol (COREG) 3.125 MG tablet Take 1 tablet (3.125 mg total) by mouth 2 (two) times daily. 03/01/17 05/30/17 Yes Wellington Hampshire, MD  Cranberry 250 MG CAPS Take 1 capsule by mouth daily.     Yes [provider]  docusate sodium (COLACE) 100 MG capsule Take 100-200 mg by mouth 2 (two) times daily. 100 mg in the morning and 200 mg at night   Yes [provider]  fish oil-omega-3 fatty acids 1000 MG capsule Take 1 g by mouth at bedtime.    Yes [provider]  furosemide (LASIX) 20 MG tablet Take one tablet by mouth daily as needed for swelling Patient taking differently: Take 20 mg by mouth daily.  07/31/16  Yes Tower, Wynelle Fanny, MD  isosorbide mononitrate (IMDUR) 30 MG 24 hr tablet Take 1 tablet (30 mg total) by mouth daily. 03/01/17 05/30/17 Yes Wellington Hampshire, MD  meclizine (ANTIVERT) 25 MG tablet Take 25 mg by mouth 4 (four) times daily as needed for dizziness.    Yes [provider]  nitroGLYCERIN (NITROSTAT) 0.4 MG SL tablet Place 1 tablet (0.4 mg total) under the tongue every 5 (five) minutes as needed for chest pain. 03/01/17  Yes Wellington Hampshire, MD  pravastatin (PRAVACHOL) 20 MG tablet TAKE 1 TABLET DAILY Patient taking differently: TAKE 20 mg tab in the evening 10/29/16  Yes Tower, Marne A, MD  sertraline (ZOLOFT) 50 MG tablet TAKE ONE AND ONE-HALF TABLETS DAILY Patient taking differently: Take 50mg  and 25mg  at night 09/17/16  Yes Tower, Wynelle Fanny, MD  Tetrahydrozoline HCl (EYE DROPS OP) Apply 1-2 drops to eye as needed (for dry eye).   Yes [provider]    Physical Exam:   Constitutional: Elderly female who appears to be  in NAD, calm, comfortable Vitals:   03/05/17 1930 03/05/17 1945 03/05/17 2015 03/05/17 2030  BP: 126/67 (!) 118/96 137/61 127/81  Pulse: 73 79 64   Resp: (!) 23 (!) 30 (!) 21 (!) 21  Temp:      TempSrc:      SpO2: 99% 100% 98%   Weight:      Height:       Eyes: PERRL, lids and conjunctivae normal ENMT: Mucous membranes are moist. Posterior pharynx clear of any exudate or lesions. Neck: normal, supple, no masses, no thyromegaly Respiratory: clear to auscultation bilaterally, no wheezing, no crackles. Normal respiratory effort. No accessory muscle use.  Cardiovascular: Regular rate and rhythm, no murmurs /  rubs / gallops. No extremity edema. 2+ pedal pulses. No carotid bruits. Tenderness palpation on the right substernal border that reproduces similar chest pain. Abdomen: no tenderness, no masses palpated. No hepatosplenomegaly. Bowel sounds positive.  Musculoskeletal: no clubbing / cyanosis. No joint deformity upper and lower extremities. Good ROM, no contractures. Normal muscle tone.  Skin: no rashes, lesions, ulcers. No induration Neurologic: CN 2-12 grossly intact. Sensation intact, DTR normal. Strength 5/5 in all 4.  Psychiatric: Normal judgment and insight. Alert and oriented x 3. Normal mood.     Labs on Admission: I have personally reviewed following labs and imaging studies  CBC:  Recent Labs Lab 03/05/17 1902  WBC 10.1  HGB 13.2  HCT 39.0  MCV 86.5  PLT 161   Basic Metabolic Panel:  Recent Labs Lab 03/05/17 1902  NA 137  K 3.5  CL 101  CO2 25  GLUCOSE 158*  BUN 17  CREATININE 1.14*  CALCIUM 9.3   GFR: Estimated Creatinine Clearance: 32.2 mL/min (A) (by C-G formula based on SCr of 1.14 mg/dL (H)). Liver Function Tests: No results for input(s): AST, ALT, ALKPHOS, BILITOT, PROT, ALBUMIN in the last 168 hours. No results for input(s): LIPASE, AMYLASE in the last 168 hours. No results for input(s): AMMONIA in the last 168 hours. Coagulation Profile: No  results for input(s): INR, PROTIME in the last 168 hours. Cardiac Enzymes: No results for input(s): CKTOTAL, CKMB, CKMBINDEX, TROPONINI in the last 168 hours. BNP (last 3 results) No results for input(s): PROBNP in the last 8760 hours. HbA1C: No results for input(s): HGBA1C in the last 72 hours. CBG: No results for input(s): GLUCAP in the last 168 hours. Lipid Profile: No results for input(s): CHOL, HDL, LDLCALC, TRIG, CHOLHDL, LDLDIRECT in the last 72 hours. Thyroid Function Tests: No results for input(s): TSH, T4TOTAL, FREET4, T3FREE, THYROIDAB in the last 72 hours. Anemia Panel: No results for input(s): VITAMINB12, FOLATE, FERRITIN, TIBC, IRON, RETICCTPCT in the last 72 hours. Urine analysis:    Component Value Date/Time   COLORURINE lt. yellow 08/27/2010 1535   APPEARANCEUR Cloudy (A) 10/30/2015 1321   LABSPEC 1.020 08/27/2010 1535   PHURINE 6.0 08/27/2010 1535   GLUCOSEU Negative 10/30/2015 1321   HGBUR small 08/27/2010 1535   BILIRUBINUR Negative 10/30/2015 1321   KETONESUR NEGATIVE 06/01/2009 1743   PROTEINUR 1+ (A) 10/30/2015 1321   PROTEINUR NEGATIVE 06/01/2009 1743   UROBILINOGEN 0.2 10/22/2015 1558   UROBILINOGEN 0.2 08/27/2010 1535   NITRITE Negative 10/30/2015 1321   NITRITE positive 08/27/2010 1535   LEUKOCYTESUR Trace (A) 10/30/2015 1321   Sepsis Labs: No results found for this or any previous visit (from the past 240 hour(s)).   Radiological Exams on Admission: Dg Chest 2 View  Result Date: 03/05/2017 CLINICAL DATA:  chest pain. Pt stated that she's been having chest pains off and on for 1 week. She said it started in the middle of her chest, but now hurts in her right side chest. No sob or other pains. Hx of HTN EXAM: CHEST  2 VIEW COMPARISON:  02/23/2017 FINDINGS: moderate thoracic spondylosis. Lower cervical spine fixation. Numerous leads and wires project over the chest. Midline trachea. Borderline cardiomegaly. Atherosclerosis in the transverse aorta. No  pleural effusion or pneumothorax. Mild scarring in the lingula. IMPRESSION: No acute cardiopulmonary disease. Aortic atherosclerosis. Electronically Signed   By: Abigail Miyamoto M.D.   On: 03/05/2017 19:28    EKG: Independently reviewed. Sinus rhythm with ST depressions in the lateral leads  Assessment/Plan Chest  pain:  Patient reports having centralized chest pain over the last 2 weeks that was slightly different the last few days and located on the right sternal border. Heart score 6. On physical exam pain is reported to be reproducible which gives concern for the possibility of a costochondritis. However, patient with no previous cardiac workup in the past and history is concerning for underlying cardiac issue. - Admit to a telemetry bed - Trend cardiac troponins 3 - Check echocardiogram - Nitroglycerin prn CP - Continue aspirin - Heart diet for now and NPO after 4 AM - Will need cardiology to be consulted for possible need a stress test  Essential hypertension - Continue Coreg, amlodipine,furosemide, isosorbide mononitrate  Renal insufficiency: Acute. Patient's creatinine is slightly elevated above baseline 1.03  to 1.14 with BUN 17. - Gentle IV fluid hydration overnight - Recheck bmp in a.m.   Hyperlipidemia: Lipid panel recently obtained on 5/29 total cholesterol 231, HDL 65.7, LDL 141. - Continue pravastatin  GI prophylaxis: Protonix DVT prophylaxis:Lovenox  Code Status: Full Family Communication: No family present at bedside  Disposition Plan: Likely discharge home once full cardiac evaluation is complete patient is medically stable. Consults called: Cards Admission status: Observation  Norval Morton MD [facility] Pager [phone]  If 7PM-7AM, please contact night-coverage [email or web address] Password [facility]  03/05/2017, 9:26 PM

## 2017-03-06 ENCOUNTER — Encounter (HOSPITAL_COMMUNITY): Payer: Self-pay | Admitting: *Deleted

## 2017-03-06 ENCOUNTER — Observation Stay (HOSPITAL_BASED_OUTPATIENT_CLINIC_OR_DEPARTMENT_OTHER): Payer: Medicare Other

## 2017-03-06 DIAGNOSIS — I10 Essential (primary) hypertension: Secondary | ICD-10-CM

## 2017-03-06 DIAGNOSIS — R072 Precordial pain: Secondary | ICD-10-CM

## 2017-03-06 DIAGNOSIS — N179 Acute kidney failure, unspecified: Secondary | ICD-10-CM | POA: Diagnosis not present

## 2017-03-06 DIAGNOSIS — E785 Hyperlipidemia, unspecified: Secondary | ICD-10-CM | POA: Diagnosis not present

## 2017-03-06 DIAGNOSIS — R0789 Other chest pain: Secondary | ICD-10-CM | POA: Diagnosis not present

## 2017-03-06 DIAGNOSIS — I340 Nonrheumatic mitral (valve) insufficiency: Secondary | ICD-10-CM

## 2017-03-06 DIAGNOSIS — N289 Disorder of kidney and ureter, unspecified: Secondary | ICD-10-CM | POA: Diagnosis not present

## 2017-03-06 LAB — CBC WITH DIFFERENTIAL/PLATELET
Basophils Absolute: 0 10*3/uL (ref 0.0–0.1)
Basophils Relative: 0 %
EOS ABS: 0.3 10*3/uL (ref 0.0–0.7)
EOS PCT: 3 %
HCT: 39.4 % (ref 36.0–46.0)
Hemoglobin: 13 g/dL (ref 12.0–15.0)
LYMPHS ABS: 5.1 10*3/uL — AB (ref 0.7–4.0)
Lymphocytes Relative: 53 %
MCH: 28.8 pg (ref 26.0–34.0)
MCHC: 33 g/dL (ref 30.0–36.0)
MCV: 87.4 fL (ref 78.0–100.0)
MONO ABS: 0.8 10*3/uL (ref 0.1–1.0)
Monocytes Relative: 8 %
Neutro Abs: 3.4 10*3/uL (ref 1.7–7.7)
Neutrophils Relative %: 36 %
PLATELETS: 273 10*3/uL (ref 150–400)
RBC: 4.51 MIL/uL (ref 3.87–5.11)
RDW: 13.4 % (ref 11.5–15.5)
WBC: 9.7 10*3/uL (ref 4.0–10.5)

## 2017-03-06 LAB — BASIC METABOLIC PANEL
Anion gap: 8 (ref 5–15)
BUN: 17 mg/dL (ref 6–20)
CALCIUM: 9 mg/dL (ref 8.9–10.3)
CO2: 27 mmol/L (ref 22–32)
CREATININE: 1.06 mg/dL — AB (ref 0.44–1.00)
Chloride: 105 mmol/L (ref 101–111)
GFR calc Af Amer: 54 mL/min — ABNORMAL LOW (ref >60)
GFR, EST NON AFRICAN AMERICAN: 46 mL/min — AB (ref >60)
GLUCOSE: 94 mg/dL (ref 65–99)
POTASSIUM: 4.6 mmol/L (ref 3.5–5.1)
SODIUM: 140 mmol/L (ref 135–145)

## 2017-03-06 LAB — TROPONIN I
Troponin I: <0.03 ng/mL (ref <0.03)
Troponin I: <0.03 ng/mL (ref <0.03)

## 2017-03-06 LAB — ECHOCARDIOGRAM COMPLETE
Height: 62 in
WEIGHTICAEL: 2392 [oz_av]

## 2017-03-06 MED ORDER — SODIUM CHLORIDE 0.9 % IV SOLN
INTRAVENOUS | Status: DC
  Administered 2017-03-06: 01:00:00 via INTRAVENOUS

## 2017-03-06 MED ORDER — PRAVASTATIN SODIUM 40 MG PO TABS
40.0000 mg | ORAL_TABLET | Freq: Every day | ORAL | Status: DC
  Administered 2017-03-06 – 2017-03-07 (×2): 40 mg via ORAL
  Filled 2017-03-06 (×2): qty 1

## 2017-03-06 MED ORDER — KETOROLAC TROMETHAMINE 15 MG/ML IJ SOLN
15.0000 mg | Freq: Three times a day (TID) | INTRAMUSCULAR | Status: DC
  Administered 2017-03-06 – 2017-03-07 (×4): 15 mg via INTRAVENOUS
  Filled 2017-03-06 (×4): qty 1

## 2017-03-06 MED ORDER — TRAMADOL HCL 50 MG PO TABS: 50.0000 mg | ORAL_TABLET | Freq: Two times a day (BID) | ORAL | Status: DC | PRN

## 2017-03-06 NOTE — Plan of Care (Signed)
Problem: Pain Managment: Goal: General experience of comfort will improve Outcome: Progressing No c/o chest pain during shift   Problem: Physical Regulation: Goal: Ability to maintain clinical measurements within normal limits will improve Outcome: Progressing VSS   Problem: Tissue Perfusion: Goal: Risk factors for ineffective tissue perfusion will decrease Outcome: Progressing O2 sats maintained in 90's on room air   Problem: Activity: Goal: Risk for activity intolerance will decrease Outcome: Progressing Walked in hallways, no shortness of breath, some fatigue

## 2017-03-06 NOTE — Progress Notes (Signed)
Progress Note  Patient Name: IDALEE FOXWORTHY Date of Encounter: 03/06/2017  Primary Cardiologist: Fletcher Anon    Subjective   NO CP laying in bed  Des have when push on chest    Inpatient Medications    Scheduled Meds: . amLODipine  10 mg Oral Daily  . aspirin EC  81 mg Oral Daily  . carvedilol  3.125 mg Oral BID WC  . docusate sodium  100 mg Oral Daily   And  . docusate sodium  200 mg Oral QHS  . enoxaparin (LOVENOX) injection  40 mg Subcutaneous Daily  . furosemide  20 mg Oral Daily  . isosorbide mononitrate  30 mg Oral Daily  . pantoprazole  40 mg Oral Daily  . pravastatin  20 mg Oral q1800  . sertraline  75 mg Oral QHS   Continuous Infusions: . sodium chloride 75 mL/hr at 03/06/17 0115   PRN Meds: acetaminophen, gi cocktail, meclizine, morphine injection, nitroGLYCERIN, ondansetron (ZOFRAN) IV, polyvinyl alcohol   Vital Signs    Vitals:   03/05/17 2145 03/05/17 2243 03/06/17 0652 03/06/17 0900  BP: 129/69 138/65 (!) 154/71 (!) 150/53  Pulse: 61 64 70 61  Resp: (!) 23 20  20   Temp:  98.7 F (37.1 C) 97.8 F (36.6 C) 98.4 F (36.9 C)  TempSrc:  Oral Oral Oral  SpO2: 100% 100% 99% 98%  Weight:  67.2 kg (148 lb 3.2 oz) 67.8 kg (149 lb 8 oz)   Height:  5\' 2"  (1.575 m)      Intake/Output Summary (Last 24 hours) at 03/06/17 1012 Last data filed at 03/06/17 0900  Gross per 24 hour  Intake              240 ml  Output              500 ml  Net             -260 ml   Filed Weights   03/05/17 1900 03/05/17 2243 03/06/17 0652  Weight: 68.9 kg (152 lb) 67.2 kg (148 lb 3.2 oz) 67.8 kg (149 lb 8 oz)    Telemetry    SR   - Personally Reviewed  ECG      Physical Exam   GEN: Thin 83 yoNo acute distress.   Neck: No JVD Cardiac: RRR, no murmurs, rubs, or gallops. Chest  Tender to palpation R parasternal region  This is pain she had yesterday    Respiratory: Clear to auscultation bilaterally. GI: Soft, nontender, non-distended  MS: No edema; No deformity. Neuro:   Nonfocal  Psych: Normal affect   Labs    Chemistry Recent Labs Lab 03/05/17 1902 03/06/17 0532  NA 137 140  K 3.5 4.6  CL 101 105  CO2 25 27  GLUCOSE 158* 94  BUN 17 17  CREATININE 1.14* 1.06*  CALCIUM 9.3 9.0  GFRNONAA 42* 46*  GFRAA 49* 54*  ANIONGAP 11 8     Hematology Recent Labs Lab 03/05/17 1902 03/06/17 0532  WBC 10.1 9.7  RBC 4.51 4.51  HGB 13.2 13.0  HCT 39.0 39.4  MCV 86.5 87.4  MCH 29.3 28.8  MCHC 33.8 33.0  RDW 13.2 13.4  PLT 277 273    Cardiac Enzymes Recent Labs Lab 03/05/17 2254 03/06/17 0217 03/06/17 0532  TROPONINI <0.03 <0.03 <0.03    Recent Labs Lab 03/05/17 1916  TROPIPOC 0.01     BNPNo results for input(s): BNP, PROBNP in the last 168 hours.   DDimer  No results for input(s): DDIMER in the last 168 hours.   Radiology    Dg Chest 2 View  Result Date: 03/05/2017 CLINICAL DATA:  chest pain. Pt stated that she's been having chest pains off and on for 1 week. She said it started in the middle of her chest, but now hurts in her right side chest. No sob or other pains. Hx of HTN EXAM: CHEST  2 VIEW COMPARISON:  02/23/2017 FINDINGS: moderate thoracic spondylosis. Lower cervical spine fixation. Numerous leads and wires project over the chest. Midline trachea. Borderline cardiomegaly. Atherosclerosis in the transverse aorta. No pleural effusion or pneumothorax. Mild scarring in the lingula. IMPRESSION: No acute cardiopulmonary disease. Aortic atherosclerosis. Electronically Signed   By: Abigail Miyamoto M.D.   On: 03/05/2017 19:28    Cardiac Studies     Patient Profile     83 y.o.  With history of CP, HTN, HL  Seen by Rod Can  On 6/4  He was concerned about possible unstable angina  Pt preferred medical Rx   Came to ER yesterdaty with CP   Admitted with CP    Assessment & Plan    1  Chest pain  Current pain is reproducibile if I press chest  I think muscular  Not cardiac Review of note from Rod Can and with pt she had more vague  epigastric sensations earlier  He also noted fatigue  Again, something different than the pain at admit  I would treat with pain meds  Rest I told patient and family taht I understand desire to max meds, avoid invasive Rx but she has been admitted   Given other symtpoms described I think it would be reasonable to perform noninvasive eval tomorrow Today I would treat for pain  Ambulate some in controlled setting and follow symptoms and BP    2  Bradycardia  No signif on tele    3  HTN  BP up some this AM  Not bad  Was high in ED    4  HL  LDL is not optimized  ON pravastatin 20  Sensitive to statins  Would increase to 40    Signed, Dorris Carnes, MD  03/06/2017, 10:12 AM

## 2017-03-06 NOTE — Progress Notes (Signed)
PROGRESS NOTE  Linda Robinson  ZOX:[id number] DOB: 02-27-1929 DOA: 03/05/2017 PCP: Abner Greenspan, MD Outpatient Specialists:  Subjective: Patient lays in bed denies any pain this morning Pain is reproducible in the right costochondral joints and the level of likely 5/6  Brief Narrative:  Linda Robinson is a 83 y.o. female with medical history significant of HTN, HLD, GERD; who presents with reports of right-sided chest pain over the last 2-3 days. Today, while riding in the car from the grocery store patient reports right substernal chest pain that she describes as a dull and heavy pain began. Associated symptoms included feelings of lightheadedness, palpitations, belching, and diaphoresis. She chronically has low back pain after having laminectomy lumbar spine. Denies any change in weight, loss of consciousness, strenuous activity, recent travel, leg swelling, calf pain, nausea, vomiting, shortness of breath, or cough. She is been having similar pains that previously were more centralized intermittently for the last 2 weeks. She was evaluated by her primary care provider who sent her to cardiology for days ago. Patient was seen by Dr. Fletcher Anon cardiology who questionws the possibility of unstable angina. He's switched the patient from metoprolol to Coreg, added Imdur, and nitroglycerin as needed. Once patient returned home she tried taking 2 nitroglycerin without any relief of symptoms. She took 4 baby aspirin and chew them  Assessment & Plan:   Principal Problem:   Chest pain Active Problems:   Hyperlipidemia LDL goal <130   Essential hypertension   Renal insufficiency   Chest pain -Presented with atypical chest pain, being going on for the past 2 weeks, pain comes and goes. -Heart score of 6 -Admit to telemetry, currently have 3 negative troponins, 2-D echocardiogram pending. -Pain is reproducible with palpation on the right costochondral joint and the level of fifth and sixth  ribs. -Cannot rule out costochondritis, treated with pain medications, NSAIDs (start PPI with it) cysts -Cardiology consulted, recommended noninvasive testing in a.m.  Essential hypertension -Continue Coreg, amlodipine, furosemide, isosorbide mononitrate.  Acute kidney injury -Patient baseline is 1.0, slightly elevated at 1.14 with BUN of 17.  -Started on IV fluid hydration overnight, creatinine improved to 1.0 this morning.  Hyperlipidemia -Continue pravastatin.   DVT prophylaxis:  Code Status: Full Code Family Communication:  Disposition Plan:  Diet: Diet NPO time specified Diet Heart Room service appropriate? Yes; Fluid consistency: Thin  Consultants:   Cardiology  Procedures:   None  Antimicrobials:   None   Objective: Vitals:   03/05/17 2145 03/05/17 2243 03/06/17 0652 03/06/17 0900  BP: 129/69 138/65 (!) 154/71 (!) 150/53  Pulse: 61 64 70 61  Resp: (!) 23 20  20   Temp:  98.7 F (37.1 C) 97.8 F (36.6 C) 98.4 F (36.9 C)  TempSrc:  Oral Oral Oral  SpO2: 100% 100% 99% 98%  Weight:  67.2 kg (148 lb 3.2 oz) 67.8 kg (149 lb 8 oz)   Height:  5\' 2"  (1.575 m)      Intake/Output Summary (Last 24 hours) at 03/06/17 1045 Last data filed at 03/06/17 0900  Gross per 24 hour  Intake              240 ml  Output              500 ml  Net             -260 ml   Filed Weights   03/05/17 1900 03/05/17 2243 03/06/17 0652  Weight: 68.9 kg (152 lb) 67.2 kg (148  lb 3.2 oz) 67.8 kg (149 lb 8 oz)    Examination: General exam: Appears calm and comfortable  Respiratory system: Clear to auscultation. Respiratory effort normal. Cardiovascular system: S1 & S2 heard, RRR. No JVD, murmurs, rubs, gallops or clicks. No pedal edema. Gastrointestinal system: Abdomen is nondistended, soft and nontender. No organomegaly or masses felt. Normal bowel sounds heard. Central nervous system: Alert and oriented. No focal neurological deficits. Extremities: Symmetric 5 x 5 power. Skin: No  rashes, lesions or ulcers Psychiatry: Judgement and insight appear normal. Mood & affect appropriate.   Data Reviewed: I have personally reviewed following labs and imaging studies  CBC:  Recent Labs Lab 03/05/17 1902 03/06/17 0532  WBC 10.1 9.7  NEUTROABS  --  3.4  HGB 13.2 13.0  HCT 39.0 39.4  MCV 86.5 87.4  PLT 277 809   Basic Metabolic Panel:  Recent Labs Lab 03/05/17 1902 03/06/17 0532  NA 137 140  K 3.5 4.6  CL 101 105  CO2 25 27  GLUCOSE 158* 94  BUN 17 17  CREATININE 1.14* 1.06*  CALCIUM 9.3 9.0   GFR: Estimated Creatinine Clearance: 34.4 mL/min (A) (by C-G formula based on SCr of 1.06 mg/dL (H)). Liver Function Tests: No results for input(s): AST, ALT, ALKPHOS, BILITOT, PROT, ALBUMIN in the last 168 hours. No results for input(s): LIPASE, AMYLASE in the last 168 hours. No results for input(s): AMMONIA in the last 168 hours. Coagulation Profile: No results for input(s): INR, PROTIME in the last 168 hours. Cardiac Enzymes:  Recent Labs Lab 03/05/17 2254 03/06/17 0217 03/06/17 0532  TROPONINI <0.03 <0.03 <0.03   BNP (last 3 results) No results for input(s): PROBNP in the last 8760 hours. HbA1C: No results for input(s): HGBA1C in the last 72 hours. CBG: No results for input(s): GLUCAP in the last 168 hours. Lipid Profile: No results for input(s): CHOL, HDL, LDLCALC, TRIG, CHOLHDL, LDLDIRECT in the last 72 hours. Thyroid Function Tests: No results for input(s): TSH, T4TOTAL, FREET4, T3FREE, THYROIDAB in the last 72 hours. Anemia Panel: No results for input(s): VITAMINB12, FOLATE, FERRITIN, TIBC, IRON, RETICCTPCT in the last 72 hours. Urine analysis:    Component Value Date/Time   COLORURINE lt. yellow 08/27/2010 1535   APPEARANCEUR Cloudy (A) 10/30/2015 1321   LABSPEC 1.020 08/27/2010 1535   PHURINE 6.0 08/27/2010 1535   GLUCOSEU Negative 10/30/2015 1321   HGBUR small 08/27/2010 1535   BILIRUBINUR Negative 10/30/2015 1321   KETONESUR  NEGATIVE 06/01/2009 1743   PROTEINUR 1+ (A) 10/30/2015 1321   PROTEINUR NEGATIVE 06/01/2009 1743   UROBILINOGEN 0.2 10/22/2015 1558   UROBILINOGEN 0.2 08/27/2010 1535   NITRITE Negative 10/30/2015 1321   NITRITE positive 08/27/2010 1535   LEUKOCYTESUR Trace (A) 10/30/2015 1321   Sepsis Labs: @LABRCNTIP (procalcitonin:4,lacticidven:4)  )No results found for this or any previous visit (from the past 240 hour(s)).   Invalid input(s): PROCALCITONIN, LACTICACIDVEN   Radiology Studies: Dg Chest 2 View  Result Date: 03/05/2017 CLINICAL DATA:  chest pain. Pt stated that she's been having chest pains off and on for 1 week. She said it started in the middle of her chest, but now hurts in her right side chest. No sob or other pains. Hx of HTN EXAM: CHEST  2 VIEW COMPARISON:  02/23/2017 FINDINGS: moderate thoracic spondylosis. Lower cervical spine fixation. Numerous leads and wires project over the chest. Midline trachea. Borderline cardiomegaly. Atherosclerosis in the transverse aorta. No pleural effusion or pneumothorax. Mild scarring in the lingula. IMPRESSION: No acute cardiopulmonary disease.  Aortic atherosclerosis. Electronically Signed   By: Abigail Miyamoto M.D.   On: 03/05/2017 19:28        Scheduled Meds: . amLODipine  10 mg Oral Daily  . aspirin EC  81 mg Oral Daily  . carvedilol  3.125 mg Oral BID WC  . docusate sodium  100 mg Oral Daily   And  . docusate sodium  200 mg Oral QHS  . enoxaparin (LOVENOX) injection  40 mg Subcutaneous Daily  . furosemide  20 mg Oral Daily  . isosorbide mononitrate  30 mg Oral Daily  . pantoprazole  40 mg Oral Daily  . pravastatin  40 mg Oral q1800  . sertraline  75 mg Oral QHS   Continuous Infusions:   LOS: 0 days    Time spent: 35 minutes    Aramis Weil A, MD [facility] Pager [phone]  If 7PM-7AM, please contact night-coverage [email or web address] Password [email or web address] 03/06/2017, 10:45 AM

## 2017-03-06 NOTE — Progress Notes (Signed)
Patient ambulated to nurses station and back, denied pain but states she felt tired and her chest felt heavy when she got back in bed.  Denies any shortness of breath.  Will continue to monitor.

## 2017-03-06 NOTE — Progress Notes (Signed)
Echocardiogram 2D Echocardiogram has been performed.  Joelene Millin 03/06/2017, 12:01 PM

## 2017-03-07 ENCOUNTER — Observation Stay (HOSPITAL_BASED_OUTPATIENT_CLINIC_OR_DEPARTMENT_OTHER): Payer: Medicare Other

## 2017-03-07 DIAGNOSIS — R079 Chest pain, unspecified: Secondary | ICD-10-CM | POA: Diagnosis not present

## 2017-03-07 DIAGNOSIS — R072 Precordial pain: Secondary | ICD-10-CM | POA: Diagnosis not present

## 2017-03-07 DIAGNOSIS — I10 Essential (primary) hypertension: Secondary | ICD-10-CM | POA: Diagnosis not present

## 2017-03-07 DIAGNOSIS — N289 Disorder of kidney and ureter, unspecified: Secondary | ICD-10-CM | POA: Diagnosis not present

## 2017-03-07 DIAGNOSIS — E785 Hyperlipidemia, unspecified: Secondary | ICD-10-CM | POA: Diagnosis not present

## 2017-03-07 LAB — NM MYOCAR MULTI W/SPECT W/WALL MOTION / EF
Peak HR: 96 {beats}/min
Rest HR: 63 {beats}/min

## 2017-03-07 MED ORDER — PRAVASTATIN SODIUM 40 MG PO TABS: 40.0000 mg | ORAL_TABLET | Freq: Every day | ORAL | 0 refills | Status: DC

## 2017-03-07 MED ORDER — PANTOPRAZOLE SODIUM 40 MG PO TBEC: 40.0000 mg | DELAYED_RELEASE_TABLET | Freq: Every day | ORAL | 1 refills | Status: DC

## 2017-03-07 MED ORDER — REGADENOSON 0.4 MG/5ML IV SOLN
INTRAVENOUS | Status: AC
  Filled 2017-03-07: qty 5

## 2017-03-07 MED ORDER — TECHNETIUM TC 99M TETROFOSMIN IV KIT
10.0000 | PACK | Freq: Once | INTRAVENOUS | Status: AC | PRN
  Administered 2017-03-07: 10 via INTRAVENOUS

## 2017-03-07 MED ORDER — REGADENOSON 0.4 MG/5ML IV SOLN
0.4000 mg | Freq: Once | INTRAVENOUS | Status: AC
Start: 2017-03-07 — End: 2017-03-07
  Administered 2017-03-07: 0.4 mg via INTRAVENOUS
  Filled 2017-03-07: qty 5

## 2017-03-07 MED ORDER — TECHNETIUM TC 99M TETROFOSMIN IV KIT
30.0000 | PACK | Freq: Once | INTRAVENOUS | Status: AC | PRN
  Administered 2017-03-07: 30 via INTRAVENOUS

## 2017-03-07 MED ORDER — MAGNESIUM HYDROXIDE 400 MG/5ML PO SUSP
30.0000 mL | Freq: Once | ORAL | Status: AC
  Administered 2017-03-07: 30 mL via ORAL
  Filled 2017-03-07: qty 30

## 2017-03-07 MED ORDER — POLYETHYLENE GLYCOL 3350 17 G PO PACK
17.0000 g | PACK | Freq: Every day | ORAL | Status: DC
  Administered 2017-03-07: 17 g via ORAL
  Filled 2017-03-07: qty 1

## 2017-03-07 MED ORDER — IBUPROFEN 400 MG PO TABS: 400.0000 mg | ORAL_TABLET | Freq: Three times a day (TID) | ORAL | 0 refills | Status: AC

## 2017-03-07 NOTE — Progress Notes (Signed)
Progress Note  Patient Name: NIVEAH BOERNER Date of Encounter: 03/07/2017  Primary Cardiologist: Fletcher Anon    Subjective   Deneis CP  Breathing OK  Inpatient Medications    Scheduled Meds: . amLODipine  10 mg Oral Daily  . aspirin EC  81 mg Oral Daily  . carvedilol  3.125 mg Oral BID WC  . docusate sodium  100 mg Oral Daily   And  . docusate sodium  200 mg Oral QHS  . enoxaparin (LOVENOX) injection  40 mg Subcutaneous Daily  . furosemide  20 mg Oral Daily  . isosorbide mononitrate  30 mg Oral Daily  . ketorolac  15 mg Intravenous Q8H  . pantoprazole  40 mg Oral Daily  . pravastatin  40 mg Oral q1800  . regadenoson      . regadenoson  0.4 mg Intravenous Once  . sertraline  75 mg Oral QHS   Continuous Infusions:  PRN Meds: acetaminophen, gi cocktail, meclizine, morphine injection, nitroGLYCERIN, ondansetron (ZOFRAN) IV, polyvinyl alcohol, traMADol   Vital Signs    Vitals:   03/06/17 1131 03/06/17 1637 03/06/17 2359 03/07/17 0416  BP: (!) 178/72 (!) 122/58 (!) 148/57 (!) 156/68  Pulse: 70 66 61 (!) 55  Resp:   18 18  Temp: 98.3 F (36.8 C)  97.9 F (36.6 C) 97.9 F (36.6 C)  TempSrc: Oral  Oral Oral  SpO2: 99%  98% 100%  Weight:    68.5 kg (151 lb)  Height:        Intake/Output Summary (Last 24 hours) at 03/07/17 0909 Last data filed at 03/07/17 0809  Gross per 24 hour  Intake                0 ml  Output              900 ml  Net             -900 ml   Filed Weights   03/05/17 2243 03/06/17 0652 03/07/17 0416  Weight: 67.2 kg (148 lb 3.2 oz) 67.8 kg (149 lb 8 oz) 68.5 kg (151 lb)    Telemetry    SR  - Personally Reviewed  ECG      Physical Exam   GEN: No acute distress.   Neck: No JVD Cardiac: RRR, no murmurs, rubs, or gallops.  Respiratory: Clear to auscultation bilaterally. GI: Soft, nontender, non-distended  MS: No edema; No deformity. Neuro:  Nonfocal  Psych: Normal affect   Labs    Chemistry Recent Labs Lab 03/05/17 1902  03/06/17 0532  NA 137 140  K 3.5 4.6  CL 101 105  CO2 25 27  GLUCOSE 158* 94  BUN 17 17  CREATININE 1.14* 1.06*  CALCIUM 9.3 9.0  GFRNONAA 42* 46*  GFRAA 49* 54*  ANIONGAP 11 8     Hematology Recent Labs Lab 03/05/17 1902 03/06/17 0532  WBC 10.1 9.7  RBC 4.51 4.51  HGB 13.2 13.0  HCT 39.0 39.4  MCV 86.5 87.4  MCH 29.3 28.8  MCHC 33.8 33.0  RDW 13.2 13.4  PLT 277 273    Cardiac Enzymes Recent Labs Lab 03/05/17 2254 03/06/17 0217 03/06/17 0532  TROPONINI <0.03 <0.03 <0.03    Recent Labs Lab 03/05/17 1916  TROPIPOC 0.01     BNPNo results for input(s): BNP, PROBNP in the last 168 hours.   DDimer No results for input(s): DDIMER in the last 168 hours.   Radiology    Dg Chest 2 View  Result Date: 03/05/2017 CLINICAL DATA:  chest pain. Pt stated that she's been having chest pains off and on for 1 week. She said it started in the middle of her chest, but now hurts in her right side chest. No sob or other pains. Hx of HTN EXAM: CHEST  2 VIEW COMPARISON:  02/23/2017 FINDINGS: moderate thoracic spondylosis. Lower cervical spine fixation. Numerous leads and wires project over the chest. Midline trachea. Borderline cardiomegaly. Atherosclerosis in the transverse aorta. No pleural effusion or pneumothorax. Mild scarring in the lingula. IMPRESSION: No acute cardiopulmonary disease. Aortic atherosclerosis. Electronically Signed   By: Abigail Miyamoto M.D.   On: 03/05/2017 19:28    Cardiac Studies     Patient Profile     83 y.o. female CP, HTN, HL  Seen by Rod Can  On 6/4  He was concerned about possible unstable angina  Pt preferred medical Rx   Admitted with CP   with CP   Admitted with CP    Assessment & Plan    1  Chest pain Myovue today is low risk  I have reviewed with pt and family  She has been comfortable walking around  I would continue on current regimen  SHe has appt with Rod Can next week.   2  [place]  No signif spells on tele  3  HTN    BP is up today,  somewhat labile  Medsd were held earlier for test  Follow  Told pt to take log with cuff to appt with Arida    4. HL  INcreased to 40    Signed, Dorris Carnes, MD  03/07/2017, 9:09 AM

## 2017-03-07 NOTE — Progress Notes (Signed)
Discharge instructions reviewed with patient and son, questions answered, verbalized understanding.  Reviewed all medications with patient.  Patient transported to main entrance of hospital via wheelchair to be taken home by son.

## 2017-03-07 NOTE — Care Management Obs Status (Signed)
[facility] NOTIFICATION   Patient Details  Name: Linda Robinson MRN: [id number] Date of Birth: 15-Mar-1929   Medicare Observation Status Notification Given:  Yes    Carles Collet, RN 03/07/2017, 3:25 PM

## 2017-03-07 NOTE — Progress Notes (Signed)
Patient NPO after midnight, no complaints of chest pain during night shift.

## 2017-03-07 NOTE — Progress Notes (Signed)
Patient ambulatory almost entire length of unit, no complaints of chest pain.  No dyspnea, O2 sats remained in the mid to high 90's while ambulating.  Patient's only complaint is some knee pain which is chronic.

## 2017-03-07 NOTE — Progress Notes (Signed)
Patient Name: Linda Robinson Date of Encounter: 03/07/2017  Primary Cardiologist: Dr. Antionette Char Problem List     Principal Problem:   Chest pain Active Problems:   Hyperlipidemia LDL goal <130   Essential hypertension   Renal insufficiency     Subjective   No chest pain. Seen in nuclear medicine for myoview. She tolerated the procedure well.   Inpatient Medications    Scheduled Meds: . amLODipine  10 mg Oral Daily  . aspirin EC  81 mg Oral Daily  . carvedilol  3.125 mg Oral BID WC  . docusate sodium  100 mg Oral Daily   And  . docusate sodium  200 mg Oral QHS  . enoxaparin (LOVENOX) injection  40 mg Subcutaneous Daily  . furosemide  20 mg Oral Daily  . isosorbide mononitrate  30 mg Oral Daily  . ketorolac  15 mg Intravenous Q8H  . pantoprazole  40 mg Oral Daily  . pravastatin  40 mg Oral q1800  . regadenoson      . regadenoson  0.4 mg Intravenous Once  . sertraline  75 mg Oral QHS   Continuous Infusions:  PRN Meds: acetaminophen, gi cocktail, meclizine, morphine injection, nitroGLYCERIN, ondansetron (ZOFRAN) IV, polyvinyl alcohol, traMADol   Vital Signs    Vitals:   03/06/17 1637 03/06/17 2359 03/07/17 0416 03/07/17 0943  BP: (!) 122/58 (!) 148/57 (!) 156/68 (!) 208/79  Pulse: 66 61 (!) 55   Resp:  18 18   Temp:  97.9 F (36.6 C) 97.9 F (36.6 C)   TempSrc:  Oral Oral   SpO2:  98% 100%   Weight:   151 lb (68.5 kg)   Height:        Intake/Output Summary (Last 24 hours) at 03/07/17 1021 Last data filed at 03/07/17 0900  Gross per 24 hour  Intake                0 ml  Output              900 ml  Net             -900 ml   Filed Weights   03/05/17 2243 03/06/17 0652 03/07/17 0416  Weight: 148 lb 3.2 oz (67.2 kg) 149 lb 8 oz (67.8 kg) 151 lb (68.5 kg)    Physical Exam   GEN: Well nourished, well developed, in no acute distress.  HEENT: Grossly normal.  Neck: Supple, no JVD, carotid bruits, or masses. Cardiac: RRR, no murmurs, rubs, or  gallops. No clubbing, cyanosis, edema.  Radials/DP/PT 2+ and equal bilaterally.  Respiratory:  Respirations regular and unlabored, clear to auscultation bilaterally. GI: Soft, nontender, nondistended, BS + x 4. MS: no deformity or atrophy. Skin: warm and dry, no rash. Neuro:  Strength and sensation are intact. Psych: AAOx3.  Normal affect.  Labs    CBC  Recent Labs  03/05/17 1902 03/06/17 0532  WBC 10.1 9.7  NEUTROABS  --  3.4  HGB 13.2 13.0  HCT 39.0 39.4  MCV 86.5 87.4  PLT 277 211   Basic Metabolic Panel  Recent Labs  03/05/17 1902 03/06/17 0532  NA 137 140  K 3.5 4.6  CL 101 105  CO2 25 27  GLUCOSE 158* 94  BUN 17 17  CREATININE 1.14* 1.06*  CALCIUM 9.3 9.0   Liver Function Tests No results for input(s): AST, ALT, ALKPHOS, BILITOT, PROT, ALBUMIN in the last 72 hours. No results for input(s): LIPASE, AMYLASE in  the last 72 hours. Cardiac Enzymes  Recent Labs  03/05/17 2254 03/06/17 0217 03/06/17 0532  TROPONINI <0.03 <0.03 <0.03   BNP Invalid input(s): POCBNP D-Dimer No results for input(s): DDIMER in the last 72 hours. Hemoglobin A1C No results for input(s): HGBA1C in the last 72 hours. Fasting Lipid Panel No results for input(s): CHOL, HDL, LDLCALC, TRIG, CHOLHDL, LDLDIRECT in the last 72 hours. Thyroid Function Tests No results for input(s): TSH, T4TOTAL, T3FREE, THYROIDAB in the last 72 hours.  Invalid input(s): FREET3  Telemetry    NSR - Personally Reviewed  ECG   NSR, HR 74, minimal ST depression - Personally Reviewed  Radiology    Dg Chest 2 View  Result Date: 03/05/2017 CLINICAL DATA:  chest pain. Pt stated that she's been having chest pains off and on for 1 week. She said it started in the middle of her chest, but now hurts in her right side chest. No sob or other pains. Hx of HTN EXAM: CHEST  2 VIEW COMPARISON:  02/23/2017 FINDINGS: moderate thoracic spondylosis. Lower cervical spine fixation. Numerous leads and wires project over  the chest. Midline trachea. Borderline cardiomegaly. Atherosclerosis in the transverse aorta. No pleural effusion or pneumothorax. Mild scarring in the lingula. IMPRESSION: No acute cardiopulmonary disease. Aortic atherosclerosis. Electronically Signed   By: Abigail Miyamoto M.D.   On: 03/05/2017 19:28    Cardiac Studies    2D ECHO: 03/06/2017 LV EF: 55% -   60% Study Conclusions - Left ventricle: The cavity size was normal. Wall thickness was   increased in a pattern of mild LVH. Systolic function was normal.   The estimated ejection fraction was in the range of 55% to 60%. - Mitral valve: There was mild regurgitation. - Atrial septum: No defect or patent foramen ovale was identified. - Pulmonary arteries: PA peak pressure: 35 mm Hg (S).   Patient Profile     Linda Robinson is a 83 y.o. female with a history of HTN, HLD, bradycardia who presented to [facility] ED on 03/05/17 with chest pain.   Assessment & Plan    Chest pain: felt to be somewhat atypical and notably her pain is easily reproduced with palpation. She has ruled out for MI. 2D ECHO showed normal LV function with no WMAs. Undergoing nuclear stress test today. Await nuclear images.   HTN: she is hypertensive currently but hasn't gotten her AM meds yet.   HLD: LDL not optimized. Pravastatin increased from 20mg  --> 40mg  daily.   Bradycardia: HR currently in low 60s.     Signed, Angelena Form, PA-C  03/07/2017, 10:21 AM

## 2017-03-07 NOTE — Discharge Summary (Signed)
Physician Discharge Summary  CHARNEL GILES XBJ:[id number] DOB: [birth year] DOA: 03/05/2017  PCP: Abner Greenspan, MD  Admit date: 03/05/2017 Discharge date: 03/07/2017  Admitted From: Home Disposition: Home  Recommendations for Outpatient Follow-up:  1. Follow up with PCP in 1-2 weeks 2. Please obtain BMP/CBC in one week  Home Health: NA Equipment/Devices:NA  Discharge Condition: Stable CODE STATUS: Full Code Diet recommendation: Diet Heart Room service appropriate? Yes; Fluid consistency: Thin Diet - low sodium heart healthy  Brief/Interim Summary: Linda Robinson a 83 y.o.femalewith medical history significant of HTN, HLD, GERD; who presents with reports of right-sided chest pain over the last 2-3 days. Today,while riding in the car from the grocery store patient reports right substernal chest pain that she describes as a dull and heavy pain began. Associated symptoms included feelings of lightheadedness, palpitations, belching, and diaphoresis. She chronically has low back pain after having laminectomy lumbar spine. Denies any change in weight, loss of consciousness, strenuous activity, recent travel, leg swelling, calf pain, nausea, vomiting, shortness of breath, or cough. She is been having similar pains that previously were more centralized intermittently for the last 2 weeks. She was evaluated by her primary care provider who sent her to cardiology for days ago. Patient was seen by Dr. Aridacardiologywho questionwsthe possibility of unstable angina. He's switched the patient from metoprolol to Coreg, added Imdur,and nitroglycerin as needed. Once patient returned home she tried taking 2 nitroglycerin without any relief of symptoms. She took 4 baby aspirin and chew them  Discharge Diagnoses:  Principal Problem:   Chest pain Active Problems:   Hyperlipidemia LDL goal <130   Essential hypertension   Renal insufficiency   Chest pain -Presented with atypical chest pain, being  going on for the past 2 weeks, pain comes and goes. -Heart score of 6 -Admit to telemetry, currently have 3 negative troponins, 2-D echo showed LVEF of 55-60% with no WMA -Pain is reproducible with palpation on the right costochondral joint and the level of fifth and sixth ribs. -Cannot rule out costochondritis, treated with pain medications, NSAIDs (start PPI with it) cysts -Cardiology consulted stress test done and showed no evidence of ischemia, see the full report below. -Cannot rule out right sided costochondritis, discharged on ibuprofen scheduled for 5 days and Protonix.  Essential hypertension -Continue Coreg, amlodipine, furosemide, isosorbide mononitrate.  Acute kidney injury -Patient baseline is 1.0, slightly elevated at 1.14 with BUN of 17.  -Started on IV fluid hydration overnight, creatinine improved to 1.0 this morning.  Hyperlipidemia -LDL is 145, increased pravastatin to 40 mg.   Discharge Instructions  Discharge Instructions    Diet - low sodium heart healthy    Complete by:  As directed    Increase activity slowly    Complete by:  As directed      Allergies as of 03/07/2017      Reactions   Ace Inhibitors    REACTION: increased K+, increased creat.   Cephalosporins    REACTION: reaction not known   Colesevelam    REACTION: constipation   Lovastatin    REACTION: doesn't work   Raloxifene    REACTION: cramps   Rosuvastatin    REACTION: myalgia   Simvastatin    REACTION: myalgia      Medication List    TAKE these medications   amLODipine 10 MG tablet Commonly known as:  NORVASC TAKE 1 TABLET DAILY   aspirin EC 81 MG tablet Take 1 tablet (81 mg total) by mouth daily.  CALCIUM 500 + D 500-125 MG-UNIT Tabs Generic drug:  Calcium Carbonate-Vitamin D Take by mouth.   carvedilol 3.125 MG tablet Commonly known as:  COREG Take 1 tablet (3.125 mg total) by mouth 2 (two) times daily.   Cranberry 250 MG Caps Take 1 capsule by mouth daily.    docusate sodium 100 MG capsule Commonly known as:  COLACE Take 100-200 mg by mouth 2 (two) times daily. 100 mg in the morning and 200 mg at night   EYE DROPS OP Apply 1-2 drops to eye as needed (for dry eye).   fish oil-omega-3 fatty acids 1000 MG capsule Take 1 g by mouth at bedtime.   furosemide 20 MG tablet Commonly known as:  LASIX Take one tablet by mouth daily as needed for swelling What changed:  how much to take  how to take this  when to take this  additional instructions   ibuprofen 400 MG tablet Commonly known as:  ADVIL Take 1 tablet (400 mg total) by mouth 3 (three) times daily.   isosorbide mononitrate 30 MG 24 hr tablet Commonly known as:  IMDUR Take 1 tablet (30 mg total) by mouth daily.   meclizine 25 MG tablet Commonly known as:  ANTIVERT Take 25 mg by mouth 4 (four) times daily as needed for dizziness.   nitroGLYCERIN 0.4 MG SL tablet Commonly known as:  NITROSTAT Place 1 tablet (0.4 mg total) under the tongue every 5 (five) minutes as needed for chest pain.   pantoprazole 40 MG tablet Commonly known as:  PROTONIX Take 1 tablet (40 mg total) by mouth daily.   pravastatin 40 MG tablet Commonly known as:  PRAVACHOL Take 1 tablet (40 mg total) by mouth daily. What changed:  medication strength  See the new instructions.   sertraline 50 MG tablet Commonly known as:  ZOLOFT TAKE ONE AND ONE-HALF TABLETS DAILY What changed:  See the new instructions.       Allergies  Allergen Reactions  . Ace Inhibitors     REACTION: increased K+, increased creat.  . Cephalosporins     REACTION: reaction not known  . Colesevelam     REACTION: constipation  . Lovastatin     REACTION: doesn't work  . Raloxifene     REACTION: cramps  . Rosuvastatin     REACTION: myalgia  . Simvastatin     REACTION: myalgia    Consultations:  Cardiology   Procedures (Echo, Carotid, EGD, Colonoscopy, ERCP)   Radiological studies: Dg Chest 2  View  Result Date: 03/05/2017 CLINICAL DATA:  chest pain. Pt stated that she's been having chest pains off and on for 1 week. She said it started in the middle of her chest, but now hurts in her right side chest. No sob or other pains. Hx of HTN EXAM: CHEST  2 VIEW COMPARISON:  02/23/2017 FINDINGS: moderate thoracic spondylosis. Lower cervical spine fixation. Numerous leads and wires project over the chest. Midline trachea. Borderline cardiomegaly. Atherosclerosis in the transverse aorta. No pleural effusion or pneumothorax. Mild scarring in the lingula. IMPRESSION: No acute cardiopulmonary disease. Aortic atherosclerosis. Electronically Signed   By: Abigail Miyamoto M.D.   On: 03/05/2017 19:28   Dg Chest 2 View  Result Date: 02/24/2017 CLINICAL DATA:  Chest discomfort EXAM: CHEST  2 VIEW COMPARISON:  06/01/2009 FINDINGS: Cardiac shadow is within normal limits. Calcified granuloma is again noted in the right lung apex. No focal infiltrate or sizable effusion is seen. Postsurgical changes in the cervical and lumbar  spine are noted. IMPRESSION: No active cardiopulmonary disease. Electronically Signed   By: Inez Catalina M.D.   On: 02/24/2017 08:19   Nm Myocar Multi W/spect W/wall Motion / Ef  Result Date: 03/07/2017  There was no ST segment deviation noted during stress.  No T wave inversion was noted during stress.  Findings consistent with prior myocardial infarction.  This is a low risk study.  The left ventricular ejection fraction is mildly decreased (45-54%).  Suggestion of small distal anterior wall/ apical infarct Misregistration of basal slices No ischemia EF 54% Low risk study    Subjective:  Discharge Exam: Vitals:   03/07/17 1033 03/07/17 1034 03/07/17 1225 03/07/17 1226  BP: (!) 172/64 (!) 166/60 (!) 145/128 (!) 167/55  Pulse:   62   Resp:      Temp:      TempSrc:      SpO2:   100%   Weight:      Height:       General: Pt is alert, awake, not in acute distress Cardiovascular:  RRR, S1/S2 +, no rubs, no gallops Respiratory: CTA bilaterally, no wheezing, no rhonchi Abdominal: Soft, NT, ND, bowel sounds + Extremities: no edema, no cyanosis   The results of significant diagnostics from this hospitalization (including imaging, microbiology, ancillary and laboratory) are listed below for reference.    Microbiology: No results found for this or any previous visit (from the past 240 hour(s)).   Labs: BNP (last 3 results) No results for input(s): BNP in the last 8760 hours. Basic Metabolic Panel:  Recent Labs Lab 03/05/17 1902 03/06/17 0532  NA 137 140  K 3.5 4.6  CL 101 105  CO2 25 27  GLUCOSE 158* 94  BUN 17 17  CREATININE 1.14* 1.06*  CALCIUM 9.3 9.0   Liver Function Tests: No results for input(s): AST, ALT, ALKPHOS, BILITOT, PROT, ALBUMIN in the last 168 hours. No results for input(s): LIPASE, AMYLASE in the last 168 hours. No results for input(s): AMMONIA in the last 168 hours. CBC:  Recent Labs Lab 03/05/17 1902 03/06/17 0532  WBC 10.1 9.7  NEUTROABS  --  3.4  HGB 13.2 13.0  HCT 39.0 39.4  MCV 86.5 87.4  PLT 277 273   Cardiac Enzymes:  Recent Labs Lab 03/05/17 2254 03/06/17 0217 03/06/17 0532  TROPONINI <0.03 <0.03 <0.03   BNP: Invalid input(s): POCBNP CBG: No results for input(s): GLUCAP in the last 168 hours. D-Dimer No results for input(s): DDIMER in the last 72 hours. Hgb A1c No results for input(s): HGBA1C in the last 72 hours. Lipid Profile No results for input(s): CHOL, HDL, LDLCALC, TRIG, CHOLHDL, LDLDIRECT in the last 72 hours. Thyroid function studies No results for input(s): TSH, T4TOTAL, T3FREE, THYROIDAB in the last 72 hours.  Invalid input(s): FREET3 Anemia work up No results for input(s): VITAMINB12, FOLATE, FERRITIN, TIBC, IRON, RETICCTPCT in the last 72 hours. Urinalysis    Component Value Date/Time   COLORURINE lt. yellow 08/27/2010 1535   APPEARANCEUR Cloudy (A) 10/30/2015 1321   LABSPEC 1.020  08/27/2010 1535   PHURINE 6.0 08/27/2010 1535   GLUCOSEU Negative 10/30/2015 1321   HGBUR small 08/27/2010 1535   BILIRUBINUR Negative 10/30/2015 1321   KETONESUR NEGATIVE 06/01/2009 1743   PROTEINUR 1+ (A) 10/30/2015 1321   PROTEINUR NEGATIVE 06/01/2009 1743   UROBILINOGEN 0.2 10/22/2015 1558   UROBILINOGEN 0.2 08/27/2010 1535   NITRITE Negative 10/30/2015 1321   NITRITE positive 08/27/2010 1535   LEUKOCYTESUR Trace (A) 10/30/2015 1321  Sepsis Labs Invalid input(s): PROCALCITONIN,  WBC,  LACTICIDVEN Microbiology No results found for this or any previous visit (from the past 240 hour(s)).   Time coordinating discharge: Over 30 minutes  SIGNED:   Birdie Hopes, MD  [facility] 03/07/2017, 5:07 PM Pager   If 7PM-7AM, please contact night-coverage [email or web address] Password [email or web address]

## 2017-03-09 ENCOUNTER — Encounter: Payer: Self-pay | Admitting: Family Medicine

## 2017-03-09 ENCOUNTER — Ambulatory Visit (INDEPENDENT_AMBULATORY_CARE_PROVIDER_SITE_OTHER): Payer: Medicare Other | Admitting: Family Medicine

## 2017-03-09 VITALS — BP 132/64 | HR 72 | Temp 98.1°F | Ht 62.0 in | Wt 153.2 lb

## 2017-03-09 DIAGNOSIS — I10 Essential (primary) hypertension: Secondary | ICD-10-CM | POA: Diagnosis not present

## 2017-03-09 DIAGNOSIS — E785 Hyperlipidemia, unspecified: Secondary | ICD-10-CM

## 2017-03-09 DIAGNOSIS — I700 Atherosclerosis of aorta: Secondary | ICD-10-CM

## 2017-03-09 DIAGNOSIS — M940 Chondrocostal junction syndrome [Tietze]: Secondary | ICD-10-CM

## 2017-03-09 DIAGNOSIS — R42 Dizziness and giddiness: Secondary | ICD-10-CM

## 2017-03-09 DIAGNOSIS — R072 Precordial pain: Secondary | ICD-10-CM

## 2017-03-09 DIAGNOSIS — N289 Disorder of kidney and ureter, unspecified: Secondary | ICD-10-CM

## 2017-03-09 NOTE — Progress Notes (Signed)
Subjective:    Patient ID: Linda Robinson, female    DOB: 03-27-1929, 83 y.o.   MRN: [id number]  HPI  Here for f/u of hospitalization from 6/8 to 03/07/17 for precordial pain   She presented with R substernal pain for 2-3 days -on day of admit more severe while riding in the car - assoc with light headedness/palpitations , belching and diaphoresis   She had seen Dr Fletcher Anon in cardiology shortly before that for poss unstable angina (was changed from metoprolol to coreg/ added imdur and nitro prn    She was admitted to telemetry  Nl cardiac enzymes 2D echol LVEF of 55-60 % with no WMA Pain was reproducible in R costchodrol joint around 5th to 6th ribs   (tx for costochondritis with nsaid and pi)  Cardiology saw her in the hospital and stress test showed no evidence of ischemia   D/c on ibuprofen and nsaid  No other med changes     Chemistry      Component Value Date/Time   NA 140 03/06/2017 0532   K 4.6 03/06/2017 0532   CL 105 03/06/2017 0532   CO2 27 03/06/2017 0532   BUN 17 03/06/2017 0532   CREATININE 1.06 (H) 03/06/2017 0532   CREATININE 0.90 09/15/2013 1627      Component Value Date/Time   CALCIUM 9.0 03/06/2017 0532   ALKPHOS 60 02/23/2017 1717   AST 19 02/23/2017 1717   ALT 10 02/23/2017 1717   BILITOT 0.7 02/23/2017 1717     Lab Results  Component Value Date   CHOL 231 (H) 02/23/2017   HDL 65.70 02/23/2017   LDLCALC 141 (H) 02/23/2017   LDLDIRECT 135.4 04/11/2013   TRIG 120.0 02/23/2017   CHOLHDL 4 02/23/2017  pravastatin was inc to 40 mg  Lab Results  Component Value Date   WBC 9.7 03/06/2017   HGB 13.0 03/06/2017   HCT 39.4 03/06/2017   MCV 87.4 03/06/2017   PLT 273 03/06/2017   Lab Results  Component Value Date   TSH 2.73 02/23/2017   Lab Results  Component Value Date   HGBA1C 5.8 02/23/2017    Lab Results  Component Value Date   TROPONINI <0.03 03/06/2017    Dg Chest 2 View  Result Date: 03/05/2017 CLINICAL DATA:  chest pain. Pt  stated that she's been having chest pains off and on for 1 week. She said it started in the middle of her chest, but now hurts in her right side chest. No sob or other pains. Hx of HTN EXAM: CHEST  2 VIEW COMPARISON:  02/23/2017 FINDINGS: moderate thoracic spondylosis. Lower cervical spine fixation. Numerous leads and wires project over the chest. Midline trachea. Borderline cardiomegaly. Atherosclerosis in the transverse aorta. No pleural effusion or pneumothorax. Mild scarring in the lingula. IMPRESSION: No acute cardiopulmonary disease. Aortic atherosclerosis. Electronically Signed   By: Abigail Miyamoto M.D.   On: 03/05/2017 19:28   Dg Chest 2 View  Result Date: 02/24/2017 CLINICAL DATA:  Chest discomfort EXAM: CHEST  2 VIEW COMPARISON:  06/01/2009 FINDINGS: Cardiac shadow is within normal limits. Calcified granuloma is again noted in the right lung apex. No focal infiltrate or sizable effusion is seen. Postsurgical changes in the cervical and lumbar spine are noted. IMPRESSION: No active cardiopulmonary disease. Electronically Signed   By: Inez Catalina M.D.   On: 02/24/2017 08:19   Nm Myocar Multi W/spect W/wall Motion / Ef  Result Date: 03/07/2017  There was no ST segment deviation noted during  stress.  No T wave inversion was noted during stress.  Findings consistent with prior myocardial infarction.  This is a low risk study.  The left ventricular ejection fraction is mildly decreased (45-54%).  Suggestion of small distal anterior wall/ apical infarct Misregistration of basal slices No ischemia EF 54% Low risk study     cxr re assuring  Some aortic atherosclerosis   Cardiology f/u is planned for 6/19  Wt Readings from Last 3 Encounters:  03/09/17 153 lb 4 oz (69.5 kg)  03/07/17 151 lb (68.5 kg)  03/01/17 152 lb 8 oz (69.2 kg)   BP Readings from Last 3 Encounters:  03/09/17 132/64  03/07/17 (!) 167/55  03/01/17 (!) 162/60   Still feeling weak and tired but not as bad   Per pt her  bp is labile at home  Her cuff is new  Will f/u with cardiology for that      Review of Systems Review of Systems  Constitutional: Negative for fever, appetite change,  and unexpected weight change. pos for fatigue that is improved  Eyes: Negative for pain and visual disturbance.  Respiratory: Negative for cough and shortness of breath.   Cardiovascular: Negative for cp or palpitations   pos for chest tenderness that is much improved  Gastrointestinal: Negative for nausea, diarrhea and -pos for constipation improving with stool softeners   Genitourinary: Negative for urgency and frequency.  Skin: Negative for pallor or rash   Neurological: Negative for weakness, light-headedness, numbness and headaches. pos for poor balance  Hematological: Negative for adenopathy. Does not bruise/bleed easily.  Psychiatric/Behavioral: Negative for dysphoric mood. The patient is occ nervous/anxious.         Objective:   Physical Exam  Constitutional: She appears well-developed and well-nourished. No distress.  overwt and somewhat frail appearing    HENT:  Head: Normocephalic and atraumatic.  Mouth/Throat: Oropharynx is clear and moist.  Eyes: Conjunctivae and EOM are normal. Pupils are equal, round, and reactive to light.  Neck: Normal range of motion. Neck supple. No JVD present. Carotid bruit is not present. No thyromegaly present.  Cardiovascular: Normal rate, regular rhythm, normal heart sounds and intact distal pulses.  Exam reveals no gallop.   Pulmonary/Chest: Effort normal and breath sounds normal. No respiratory distress. She has no wheezes. She has no rales. She exhibits tenderness.  No crackles  R chest wall tenderness (mid ant-lat)- w/o skin change or crepitus  Abdominal: Soft. Bowel sounds are normal. She exhibits no distension, no abdominal bruit and no mass. There is no tenderness.  Musculoskeletal: She exhibits no edema.  Lymphadenopathy:    She has no cervical adenopathy.    Neurological: She is alert. She has normal reflexes.  Skin: Skin is warm and dry. No rash noted. No pallor.  Psychiatric: She has a normal mood and affect.          Assessment & Plan:   Problem List Items Addressed This Visit      Cardiovascular and Mediastinum   Atherosclerosis of aorta (HCC)    Noted on xray  Disc imp of cholesterol control- will try adv pravastatin to 40  Rev diet        Essential hypertension - Primary (Chronic)    bp in fair control at this time  BP Readings from Last 1 Encounters:  03/09/17 132/64   No changes needed Disc lifstyle change with low sodium diet and exercise  Labs rev  Reviewed hospital records, lab results and studies in detail  Per pt- very labile at home- will take cuff to her cardiology visit          Musculoskeletal and Integument   Costochondritis    Improved with nsaid (with ppi)  Disc use of warm compress prn  Overall feeling better-? If this was poss viral  Reviewed hospital records, lab results and studies in detail  (re assuring)         Genitourinary   Renal insufficiency    Will hold the ibuprofen (which she took sparingly)  Disc hydration  Labs rev Reviewed hospital records, lab results and studies in detail  - last cr 1.06         Other   Chest pain    Improved Reviewed hospital records, lab results and studies in detail   Reassuring cardiac w/u thus far- for f/u later this month Symptoms of costochondritis are improved significantly        Dizziness    Improved s/p hospitalization with reassuring w/u so far      Hyperlipidemia LDL goal <130 (Chronic)    Aiming for better control if poss Pravastatin was inc to 40- unsure if she will tolerate Has atherosclerosis of aorta For cardiology f/u later this mo Disc goals for lipids and reasons to control them Rev labs with pt Rev low sat fat diet in detail

## 2017-03-09 NOTE — Patient Instructions (Addendum)
Try the 40 mg of pravastatin for 1 week and see how you do- discuss with cardiology when you return  If you start having problems or side effects let me know  The pharmacy can let us know when you need a refill   When you follow up with cardiology- take your cuff with you so they can test it and tell them how much your blood pressure is fluctuating   If you are interested in the new shingles vaccine (Shingrix) - call your insurance to check on coverage,( you should not get it within 1 month of other vaccines) , then call us for a prescription  for it to take to a pharmacy that gives the shot , or make a nurse visit to get it here depending on your coverage    You had a physical last October - you are not due until after October  We will get that   I'm glad you feel better ! - if chest pain returns or worsens let us know  It is ok to hold the ibuprofen

## 2017-03-10 ENCOUNTER — Telehealth: Payer: Self-pay | Admitting: Cardiovascular Disease

## 2017-03-10 NOTE — Telephone Encounter (Signed)
Pt states she thinks that her Carvedilol and Isosorbide is causing her to hurt all over. States she took these this morning, and felt like she was going to pass out. States she felt very light headed. States this happened over the weekend, and she went to the ED. Please call.

## 2017-03-10 NOTE — Telephone Encounter (Signed)
Returned call to patient.  She is experiencing some headache and lightheadedness about 2 hours after taking isosorbide and carvedilol. She thinks its coming from her medications. She's fine if she's just relaxing in her recliner but she will feel this way when she gets up and moves around. She's been making sure to drink water and stay hydrated. When she was in the hospital she does not remember feeling this way but states she was resting in the bed and not up and about. BP readings: 6/10 137/80 HR 77 6/11 179/75 HR 79 - before taking taking morning med  116/68 HR 68 - after taking meds 6/12 147/60 HR 63 6/13 141/71 HR 70 - 2 hours after meds  121/69 HR 67 - 3 hours after meds  146/69 HR 55 - at 1 pm.  Advised patient that sometimes isosorbide can cause a headache or lightheadedness and suggested for her to take before bed instead.  Patient verbalized understanding and will try this and if she develops new and worsening symptoms to call 911. Let her know I will route to Dr Fletcher Anon to make him aware and for further advice. Patient has appt on 03/16/17 with Ignacia Bayley, NP.

## 2017-03-11 NOTE — Telephone Encounter (Signed)
Pt is calling folowing up on her call from yesterday regarding her medication. Please call.

## 2017-03-11 NOTE — Assessment & Plan Note (Signed)
Aiming for better control if poss Pravastatin was inc to 40- unsure if she will tolerate Has atherosclerosis of aorta For cardiology f/u later this mo Disc goals for lipids and reasons to control them Rev labs with pt Rev low sat fat diet in detail

## 2017-03-11 NOTE — Assessment & Plan Note (Signed)
Improved Reviewed hospital records, lab results and studies in detail   Reassuring cardiac w/u thus far- for f/u later this month Symptoms of costochondritis are improved significantly

## 2017-03-11 NOTE — Assessment & Plan Note (Signed)
bp in fair control at this time  BP Readings from Last 1 Encounters:  03/09/17 132/64   No changes needed Disc lifstyle change with low sodium diet and exercise  Labs rev  Reviewed hospital records, lab results and studies in detail   Per pt- very labile at home- will take cuff to her cardiology visit

## 2017-03-11 NOTE — Assessment & Plan Note (Signed)
Will hold the ibuprofen (which she took sparingly)  Disc hydration  Labs rev Reviewed hospital records, lab results and studies in detail  - last cr 1.06

## 2017-03-11 NOTE — Telephone Encounter (Signed)
Spoke with Dr Fletcher Anon re: patient. He advised for patient to stop taking isosorbide and continue to monitor blood pressure and symptoms.  Called and gave patient his recommendations. She verbalized understanding and will let us know if symptoms continue or worsen. She is aware of upcoming appt next week on 03/16/17 with Ignacia Bayley, NP.

## 2017-03-11 NOTE — Assessment & Plan Note (Signed)
Improved with nsaid (with ppi)  Disc use of warm compress prn  Overall feeling better-? If this was poss viral  Reviewed hospital records, lab results and studies in detail  (re assuring)

## 2017-03-11 NOTE — Assessment & Plan Note (Signed)
Noted on xray  Disc imp of cholesterol control- will try adv pravastatin to 40  Rev diet

## 2017-03-11 NOTE — Assessment & Plan Note (Signed)
Improved s/p hospitalization with reassuring w/u so far

## 2017-03-16 ENCOUNTER — Ambulatory Visit (INDEPENDENT_AMBULATORY_CARE_PROVIDER_SITE_OTHER): Payer: Medicare Other | Admitting: Nurse Practitioner

## 2017-03-16 ENCOUNTER — Encounter: Payer: Self-pay | Admitting: Nurse Practitioner

## 2017-03-16 VITALS — BP 156/72 | HR 56 | Ht 62.0 in | Wt 152.5 lb

## 2017-03-16 DIAGNOSIS — R0789 Other chest pain: Secondary | ICD-10-CM

## 2017-03-16 DIAGNOSIS — I10 Essential (primary) hypertension: Secondary | ICD-10-CM | POA: Diagnosis not present

## 2017-03-16 DIAGNOSIS — E782 Mixed hyperlipidemia: Secondary | ICD-10-CM

## 2017-03-16 NOTE — Progress Notes (Signed)
Office Visit    Patient Name: Linda Robinson Date of Encounter: 03/16/2017  Primary Care Provider:  Abner Greenspan, MD Primary Cardiologist:  Jerilynn Mages. Fletcher Anon, MD   Chief Complaint    83 year old female with a history of hypertension, hyperlipidemia, arthritis, vertigo, and chronic back pain, who presents for follow-up after recent hospitalization for chest pain.  Past Medical History    Past Medical History:  Diagnosis Date  . Basal cell carcinoma    removed  . Chest pain    a. 02/2017 MV: EF 54%, small, distal anterior wall/apical infacrt (likely misregistration), no ischemia-->Low risk; b. 02/2017 Echo: EF 55-60%, mild MR, PASP 15mmHg.  Marland Kitchen Chronic back pain    stenosis  . Constipation    takes Colace daily  . Depression    takes Zoloft daily  . Diverticulosis of colon   . GERD (gastroesophageal reflux disease)    atypical chest pain  . Gout    hx of;was on Allopurinol but taken off 6 wks ago by medical MD  . History of diverticulitis of colon   . HLD (hyperlipidemia)    takes Pravastatin daily  . HTN (hypertension)    takes Amlodipine and carvedilol  . OA (osteoarthritis)    right knee  . Osteopenia    takes Calcium and Vit D daily  . Vertigo    takes Antivert daily as needed   Past Surgical History:  Procedure Laterality Date  . APPENDECTOMY  at age 29  . BACK SURGERY    . CATARACT EXTRACTION Right 04/2006  . CHOLECYSTECTOMY    . COLONOSCOPY    . KNEE SURGERY Right    Medial meniscus tear  . LUMBAR LAMINECTOMY/DECOMPRESSION MICRODISCECTOMY Bilateral 11/13/2014   Procedure: Lumbar one-two Bilateral laminectomy and foraminotomy, Left Lumbar one-two microdiscectomy ;  Surgeon: Faythe Ghee, MD;  Location: [facility];  Service: Neurosurgery;  Laterality: Bilateral;  . NECK SURGERY     x 2;fusion  . PARTIAL COLECTOMY  09/1999   d/t diverticulitis  . TUBAL LIGATION      Allergies  Allergies  Allergen Reactions  . Ace Inhibitors     REACTION: increased K+,  increased creat.  . Cephalosporins     REACTION: reaction not known  . Colesevelam     REACTION: constipation  . Lovastatin     REACTION: doesn't work  . Raloxifene     REACTION: cramps  . Rosuvastatin     REACTION: myalgia  . Simvastatin     REACTION: myalgia    History of Present Illness    83 year old female with the above past medical history including chronic back pain, arthritis, vertigo, depression, and GERD. She was recently evaluated in clinic by Dr. Fletcher Anon in the setting of intermittent chest discomfort and dyspnea. She was mildly bradycardic at the time and her metoprolol switched to carvedilol. Isosorbide mononitrate was also added. Unfortunately, she developed recurrent chest discomfort on June 8, prompting her to present to [facility]. She was admitted and ruled out. Chest pain was reproducible with palpation. Stress testing was undertaken and was low risk. Echo showed normal LV function. She was subsequently discharged. Of note, during hospitalization, she did not have any significant bradycardia on telemetry despite ongoing beta blocker therapy. Following discharge, she reported significant headache and lightheadedness. She contacted our office and isosorbide mononitrate was discontinued. Since then, she has had resolution of headache. Overall, she has been feeling better. She has not had any reproducible musculoskeletal chest discomfort. She  does sometimes have episodes of hiccups or frequent belching and she plans to try over-the-counter simethicone for this. She denies dyspnea on exertion, palpitations, PND, orthopnea, dizziness, syncope, edema, or early satiety. Blood pressures are variable at home. She notes that she has been taking her evening dose of carvedilol at 5 PM. When she gets up at 7 in the morning, blood pressures are often elevated.  Home Medications    Prior to Admission medications   Medication Sig Start Date End Date Taking? Authorizing Provider  amLODipine  (NORVASC) 10 MG tablet TAKE 1 TABLET DAILY 08/12/16  Yes Tower, Wynelle Fanny, MD  aspirin EC 81 MG tablet Take 1 tablet (81 mg total) by mouth daily. 03/01/17  Yes Wellington Hampshire, MD  Calcium Carbonate-Vitamin D (CALCIUM 500 + D) 500-125 MG-UNIT TABS Take by mouth.   Yes [provider]  carvedilol (COREG) 3.125 MG tablet Take 1 tablet (3.125 mg total) by mouth 2 (two) times daily. 03/01/17 05/30/17 Yes Wellington Hampshire, MD  Cranberry 250 MG CAPS Take 1 capsule by mouth daily.     Yes [provider]  docusate sodium (COLACE) 100 MG capsule Take 100-200 mg by mouth 2 (two) times daily. 100 mg in the morning and 200 mg at night   Yes [provider]  fish oil-omega-3 fatty acids 1000 MG capsule Take 1 g by mouth at bedtime.    Yes [provider]  furosemide (LASIX) 20 MG tablet Take one tablet by mouth daily as needed for swelling Patient taking differently: Take 20 mg by mouth daily.  07/31/16  Yes Tower, Wynelle Fanny, MD  meclizine (ANTIVERT) 25 MG tablet Take 25 mg by mouth 4 (four) times daily as needed for dizziness.    Yes [provider]  nitroGLYCERIN (NITROSTAT) 0.4 MG SL tablet Place 1 tablet (0.4 mg total) under the tongue every 5 (five) minutes as needed for chest pain. 03/01/17  Yes Wellington Hampshire, MD  pantoprazole (PROTONIX) 40 MG tablet Take 1 tablet (40 mg total) by mouth daily. 03/07/17 03/07/18 Yes Verlee Monte, MD  pravastatin (PRAVACHOL) 40 MG tablet Take 1 tablet (40 mg total) by mouth daily. 03/07/17  Yes Verlee Monte, MD  sertraline (ZOLOFT) 50 MG tablet TAKE ONE AND ONE-HALF TABLETS DAILY Patient taking differently: Take 50mg  and 25mg  at night 09/17/16  Yes Tower, Wynelle Fanny, MD  Tetrahydrozoline HCl (EYE DROPS OP) Apply 1-2 drops to eye as needed (for dry eye).   Yes [provider]    Review of Systems    As above, she has been experiencing intermittent belching. No significant chest pain, dyspnea, palpitations, PND, orthopnea,  dizziness, syncope, edema, or early satiety.  All other systems reviewed and are otherwise negative except as noted above.  Physical Exam    VS:  BP (!) 156/72 (BP Location: Left Arm, Patient Position: Sitting, Cuff Size: Normal)   Pulse (!) 56   Ht 5\' 2"  (1.575 m)   Wt 152 lb 8 oz (69.2 kg)   BMI 27.89 kg/m  , BMI Body mass index is 27.89 kg/m. GEN: Well nourished, well developed, in no acute distress.  HEENT: normal.  Neck: Supple, no JVD, carotid bruits, or masses. Cardiac: RRR, no murmurs, rubs, or gallops. No clubbing, cyanosis, edema.  Radials/DP/PT 2+ and equal bilaterally.  Respiratory:  Respirations regular and unlabored, clear to auscultation bilaterally. GI: Soft, nontender, nondistended, BS + x 4. MS: no deformity or atrophy. Skin: warm and dry, no rash. Neuro:  Strength and sensation are intact. Psych: Normal affect.  Accessory Clinical Findings    ECG - Sinus bradycardia, 56, inferior infarct, no acute ST or T changes.  Assessment & Plan    1.  Midsternal chest pain: Patient was recently admitted with chest discomfort and ruled out for MI. Stress testing was low risk. Echo showed normal LV function. Chest pain was reproducible with palpation during hospitalization. She has not had any symptoms like this. She has had some belching and will try over-the-counter simethicone. She does not tolerate isosorbide mononitrate secondary to headaches and this has been discontinued.  2. Essential hypertension: Blood pressure has been variable based on the journal that she brings today. She has been taking her evening dose of carvedilol at approximate 5 PM but her a.m. dose of carvedilol between 7 and 8 AM. I've asked her to move back her evening dose of carvedilol till 7 or 8 PM to provide better 24-hour coverage. She remains on amlodipine. Given variability of blood pressures and alteration in timing that we are making, I'm reluctant to make any other additional changes to her  medications today.  3. Hyperlipidemia: She remains on statin therapy. LDL was 141 in May and will need to be followed up.  4. Disposition: Patient will continue to follow her blood pressure 1 or 2 times daily at home. Not more than that. We will see her back in clinic in 2 months or sooner if necessary.   Murray Hodgkins, NP 03/16/2017, 2:13 PM

## 2017-03-16 NOTE — Patient Instructions (Signed)
Medication Instructions:  Please continue your current medications  Labwork: None  Testing/Procedures: None  Follow-Up: 2-3 months with Dr. Fletcher Anon or Ignacia Bayley, NP  If you need a refill on your cardiac medications before your next appointment, please call your pharmacy.

## 2017-03-20 ENCOUNTER — Telehealth: Payer: Self-pay | Admitting: Physician Assistant

## 2017-03-20 NOTE — Telephone Encounter (Signed)
The patient's son called the answering service after-hours today. Patient told him that about an hour after taking her carvedilol each day she feels fatigue, weak, tired, and "if this is the way she's going to have to live the rest of her life, she'd rather go on and die." She has also noticed her HR dropping into the 40s at times. No syncope, chest pain, SOB reported. Labile BPs per recent OV note. Patient's son states she's tracking closely. May need to be more conservative if she is in fact this sensitive to meds. I advised she hold carvedilol altogether for now and see how she feels. ER precautions discussed. Advised patient to call in on Monday to give Dr. Fletcher Anon an update on BP and how she is feeling. The patient's son understanding and gratitude.  Dayna Dunn PA-C

## 2017-03-22 ENCOUNTER — Other Ambulatory Visit: Payer: Self-pay

## 2017-03-22 ENCOUNTER — Telehealth: Payer: Self-pay | Admitting: Cardiovascular Disease

## 2017-03-22 DIAGNOSIS — I10 Essential (primary) hypertension: Secondary | ICD-10-CM

## 2017-03-22 MED ORDER — HYDROCHLOROTHIAZIDE 12.5 MG PO CAPS: 12.5000 mg | ORAL_CAPSULE | Freq: Every day | ORAL | 3 refills | Status: DC

## 2017-03-22 NOTE — Telephone Encounter (Signed)
Pt calling stating she spoke to on call provider this weekend and was advised to not take any medications and record her BP  She is calling to call them in   03/19/17 (took meds in the morning) PM 137/60 hr 61 03/20/17 (no meds)  AM 117/59 HR 53 PM 137/60 HR 61 03/21/17  AM 149/67 HR 58 PM 135/67 HR 62 03/22/17  AM 183/77 HR 62  Please advise

## 2017-03-22 NOTE — Telephone Encounter (Signed)
Reviewed recommendations w/pt who verbalized understanding and is agreeable w/plan. Prescription sent to [facility] Medication list updated. Pt scheduled for BMET 7/3 at 9:30am in our office.

## 2017-03-22 NOTE — Telephone Encounter (Signed)
BP is high. We can stop Coreg and try HCTZ 12.5 mg once daily. Check BMP in 1 week.

## 2017-03-22 NOTE — Telephone Encounter (Signed)
S/w pt who reports speaking with on-call, Melina Copa, PA-C, on Saturday, June 23.  Son called d/t pt's low HR in the 40's and fatigue. Pt advised to hold coreg if HR low and monitor BP/HR over the weekend. Last dose coreg was June 22 She called today with the readings listed below which I confirmed with patient. States she is feeling much better but is concerned of today's vital signs.  This morning she did not take coreg but took amlodipine 10mg . Half hour later BP 183/77, HR 62. Recheck while on the phone one hour post morning medication: BP185/89, HR 70. Metoprolol changed to coreg at June 4 OV due to underlying bradycardia. Advised pt to take coreg 3.125mg  now as BP elevated and HR 70  Will route to Dr. Fletcher Anon for further review and advice.   She is hesitant to take coreg but is concerned BP will become even more elevated as it did recently when she called 911. Routed to MD

## 2017-03-30 ENCOUNTER — Other Ambulatory Visit (INDEPENDENT_AMBULATORY_CARE_PROVIDER_SITE_OTHER): Payer: Medicare Other

## 2017-03-30 DIAGNOSIS — I10 Essential (primary) hypertension: Secondary | ICD-10-CM | POA: Diagnosis not present

## 2017-03-31 LAB — BASIC METABOLIC PANEL
BUN/Creatinine Ratio: 27 (ref 12–28)
BUN: 30 mg/dL — AB (ref 8–27)
CALCIUM: 10.2 mg/dL (ref 8.7–10.3)
CO2: 24 mmol/L (ref 20–29)
CREATININE: 1.13 mg/dL — AB (ref 0.57–1.00)
Chloride: 93 mmol/L — ABNORMAL LOW (ref 96–106)
GFR calc Af Amer: 51 mL/min/{1.73_m2} — ABNORMAL LOW (ref >59)
GFR, EST NON AFRICAN AMERICAN: 44 mL/min/{1.73_m2} — AB (ref >59)
Glucose: 90 mg/dL (ref 65–99)
Potassium: 4 mmol/L (ref 3.5–5.2)
Sodium: 140 mmol/L (ref 134–144)

## 2017-04-02 ENCOUNTER — Other Ambulatory Visit: Payer: Self-pay

## 2017-04-02 ENCOUNTER — Telehealth: Payer: Self-pay | Admitting: Cardiovascular Disease

## 2017-04-02 DIAGNOSIS — N289 Disorder of kidney and ureter, unspecified: Secondary | ICD-10-CM

## 2017-04-02 NOTE — Telephone Encounter (Signed)
Made Dr. Fletcher Anon aware of recent labs. BUN 30; creatinine 1.13 Per verbal from Dr. Fletcher Anon, pt should increase fluids and recheck BMET in two weeks. Reviewed results and recommendations w/pt who verbalized understanding and agreeable w/plan. She would like to have lab work at PCP and will arrange appointment.  Orders entered.

## 2017-04-16 ENCOUNTER — Other Ambulatory Visit (INDEPENDENT_AMBULATORY_CARE_PROVIDER_SITE_OTHER): Payer: Medicare Other

## 2017-04-16 DIAGNOSIS — N289 Disorder of kidney and ureter, unspecified: Secondary | ICD-10-CM | POA: Diagnosis not present

## 2017-04-16 LAB — BASIC METABOLIC PANEL
BUN: 18 mg/dL (ref 6–23)
CALCIUM: 10.2 mg/dL (ref 8.4–10.5)
CHLORIDE: 96 meq/L (ref 96–112)
CO2: 34 meq/L — AB (ref 19–32)
Creatinine, Ser: 1.03 mg/dL (ref 0.40–1.20)
GFR: 53.98 mL/min — ABNORMAL LOW (ref >60.00)
Glucose, Bld: 100 mg/dL — ABNORMAL HIGH (ref 70–99)
Potassium: 4.1 mEq/L (ref 3.5–5.1)
SODIUM: 137 meq/L (ref 135–145)

## 2017-05-10 ENCOUNTER — Telehealth: Payer: Self-pay | Admitting: Family Medicine

## 2017-05-10 DIAGNOSIS — R739 Hyperglycemia, unspecified: Secondary | ICD-10-CM

## 2017-05-10 DIAGNOSIS — E785 Hyperlipidemia, unspecified: Secondary | ICD-10-CM

## 2017-05-10 DIAGNOSIS — N289 Disorder of kidney and ureter, unspecified: Secondary | ICD-10-CM

## 2017-05-10 DIAGNOSIS — I10 Essential (primary) hypertension: Secondary | ICD-10-CM

## 2017-05-10 NOTE — Telephone Encounter (Signed)
-----   Message from Eustace Pen, LPN sent at 0/97/3532 11:11 AM EDT ----- Regarding: Labs 8/14 Please place lab orders.  [facility]

## 2017-05-10 NOTE — Progress Notes (Signed)
Pre visit review using our clinic review tool, if applicable. No additional management support is needed unless otherwise documented below in the visit note. 

## 2017-05-11 ENCOUNTER — Ambulatory Visit (INDEPENDENT_AMBULATORY_CARE_PROVIDER_SITE_OTHER): Payer: Medicare Other

## 2017-05-11 VITALS — BP 130/64 | HR 63 | Temp 97.7°F | Ht 62.5 in | Wt 150.0 lb

## 2017-05-11 DIAGNOSIS — Z0000 Encounter for general adult medical examination without abnormal findings: Secondary | ICD-10-CM | POA: Diagnosis not present

## 2017-05-11 DIAGNOSIS — R739 Hyperglycemia, unspecified: Secondary | ICD-10-CM | POA: Diagnosis not present

## 2017-05-11 DIAGNOSIS — I10 Essential (primary) hypertension: Secondary | ICD-10-CM

## 2017-05-11 DIAGNOSIS — N289 Disorder of kidney and ureter, unspecified: Secondary | ICD-10-CM | POA: Diagnosis not present

## 2017-05-11 DIAGNOSIS — E785 Hyperlipidemia, unspecified: Secondary | ICD-10-CM | POA: Diagnosis not present

## 2017-05-11 LAB — CBC WITH DIFFERENTIAL/PLATELET
BASOS ABS: 0 10*3/uL (ref 0.0–0.1)
BASOS PCT: 0.4 % (ref 0.0–3.0)
EOS ABS: 0.2 10*3/uL (ref 0.0–0.7)
Eosinophils Relative: 1.6 % (ref 0.0–5.0)
HEMATOCRIT: 44 % (ref 36.0–46.0)
Hemoglobin: 14.8 g/dL (ref 12.0–15.0)
LYMPHS ABS: 4.8 10*3/uL — AB (ref 0.7–4.0)
Lymphocytes Relative: 48.6 % — ABNORMAL HIGH (ref 12.0–46.0)
MCHC: 33.5 g/dL (ref 30.0–36.0)
MCV: 87.4 fl (ref 78.0–100.0)
MONO ABS: 0.6 10*3/uL (ref 0.1–1.0)
Monocytes Relative: 6.4 % (ref 3.0–12.0)
NEUTROS ABS: 4.3 10*3/uL (ref 1.4–7.7)
NEUTROS PCT: 43 % (ref 43.0–77.0)
PLATELETS: 321 10*3/uL (ref 150.0–400.0)
RBC: 5.03 Mil/uL (ref 3.87–5.11)
RDW: 13.2 % (ref 11.5–15.5)
WBC: 9.9 10*3/uL (ref 4.0–10.5)

## 2017-05-11 LAB — TSH: TSH: 2.27 u[IU]/mL (ref 0.35–4.50)

## 2017-05-11 LAB — COMPREHENSIVE METABOLIC PANEL
ALBUMIN: 4.5 g/dL (ref 3.5–5.2)
ALK PHOS: 52 U/L (ref 39–117)
ALT: 9 U/L (ref 0–35)
AST: 18 U/L (ref 0–37)
BUN: 21 mg/dL (ref 6–23)
CALCIUM: 10.4 mg/dL (ref 8.4–10.5)
CHLORIDE: 94 meq/L — AB (ref 96–112)
CO2: 35 mEq/L — ABNORMAL HIGH (ref 19–32)
CREATININE: 1.03 mg/dL (ref 0.40–1.20)
GFR: 53.97 mL/min — ABNORMAL LOW (ref >60.00)
Glucose, Bld: 98 mg/dL (ref 70–99)
POTASSIUM: 3.7 meq/L (ref 3.5–5.1)
Sodium: 137 mEq/L (ref 135–145)
TOTAL PROTEIN: 6.7 g/dL (ref 6.0–8.3)
Total Bilirubin: 0.6 mg/dL (ref 0.2–1.2)

## 2017-05-11 LAB — LIPID PANEL
CHOL/HDL RATIO: 3
CHOLESTEROL: 202 mg/dL — AB (ref 0–200)
HDL: 61.5 mg/dL (ref >39.00)
LDL Cholesterol: 112 mg/dL — ABNORMAL HIGH (ref 0–99)
NONHDL: 140.08
Triglycerides: 141 mg/dL (ref 0.0–149.0)
VLDL: 28.2 mg/dL (ref 0.0–40.0)

## 2017-05-11 LAB — HEMOGLOBIN A1C: HEMOGLOBIN A1C: 5.9 % (ref 4.6–6.5)

## 2017-05-11 NOTE — Patient Instructions (Signed)
Linda Robinson , Thank you for taking time to come for your Medicare Wellness Visit. I appreciate your ongoing commitment to your health goals. Please review the following plan we discussed and let me know if I can assist you in the future.   These are the goals we discussed: Goals    . Increase water intake          Starting 05/11/2017, I will continue to drink at least 6-8 glasses of water daily.        This is a list of the screening recommended for you and due dates:  Health Maintenance  Topic Date Due  . Flu Shot  12/26/2017*  . Mammogram  08/31/2017  . Tetanus Vaccine  02/09/2022  . DEXA scan (bone density measurement)  Completed  . Pneumonia vaccines  Completed  *Topic was postponed. The date shown is not the original due date.   Preventive Care for Adults  A healthy lifestyle and preventive care can promote health and wellness. Preventive health guidelines for adults include the following key practices.  . A routine yearly physical is a good way to check with your health care provider about your health and preventive screening. It is a chance to share any concerns and updates on your health and to receive a thorough exam.  . Visit your dentist for a routine exam and preventive care every 6 months. Brush your teeth twice a day and floss once a day. Good oral hygiene prevents tooth decay and gum disease.  . The frequency of eye exams is based on your age, health, family medical history, use  of contact lenses, and other factors. Follow your health care provider's ecommendations for frequency of eye exams.  . Eat a healthy diet. Foods like vegetables, fruits, whole grains, low-fat dairy products, and lean protein foods contain the nutrients you need without too many calories. Decrease your intake of foods high in solid fats, added sugars, and salt. Eat the right amount of calories for you. Get information about a proper diet from your health care provider, if necessary.  .  Regular physical exercise is one of the most important things you can do for your health. Most adults should get at least 150 minutes of moderate-intensity exercise (any activity that increases your heart rate and causes you to sweat) each week. In addition, most adults need muscle-strengthening exercises on 2 or more days a week.  Silver Sneakers may be a benefit available to you. To determine eligibility, you may visit the website: www.silversneakers.com or contact program at (562) 269-4368 Mon-Fri between 8AM-8PM.   . Maintain a healthy weight. The body mass index (BMI) is a screening tool to identify possible weight problems. It provides an estimate of body fat based on height and weight. Your health care provider can find your BMI and can help you achieve or maintain a healthy weight.   For adults 20 years and older: ? A BMI below 18.5 is considered underweight. ? A BMI of 18.5 to 24.9 is normal. ? A BMI of 25 to 29.9 is considered overweight. ? A BMI of 30 and above is considered obese.   . Maintain normal blood lipids and cholesterol levels by exercising and minimizing your intake of saturated fat. Eat a balanced diet with plenty of fruit and vegetables. Blood tests for lipids and cholesterol should begin at age 28 and be repeated every 5 years. If your lipid or cholesterol levels are high, you are over 50, or you are at high  risk for heart disease, you may need your cholesterol levels checked more frequently. Ongoing high lipid and cholesterol levels should be treated with medicines if diet and exercise are not working.  . If you smoke, find out from your health care provider how to quit. If you do not use tobacco, please do not start.  . If you choose to drink alcohol, please do not consume more than 2 drinks per day. One drink is considered to be 12 ounces (355 mL) of beer, 5 ounces (148 mL) of wine, or 1.5 ounces (44 mL) of liquor.  . If you are 7-63 years old, ask your health care  provider if you should take aspirin to prevent strokes.  . Use sunscreen. Apply sunscreen liberally and repeatedly throughout the day. You should seek shade when your shadow is shorter than you. Protect yourself by wearing long sleeves, pants, a wide-brimmed hat, and sunglasses year round, whenever you are outdoors.  . Once a month, do a whole body skin exam, using a mirror to look at the skin on your back. Tell your health care provider of new moles, moles that have irregular borders, moles that are larger than a pencil eraser, or moles that have changed in shape or color.

## 2017-05-12 NOTE — Progress Notes (Signed)
PCP notes:   Health maintenance:  Flu vaccine - addressed  Abnormal screenings:   Hearing - failed Fall risk - hx of fall with injury Depression score: 3  Patient concerns:   None  Nurse concerns:  None  Next PCP appt:   05/18/17 @ 1430  I reviewed health advisor's note, was available for consultation, and agree with documentation and plan.  Loura Pardon MD

## 2017-05-12 NOTE — Progress Notes (Signed)
Subjective:   Linda Robinson is a 83 y.o. female who presents for Medicare Annual (Subsequent) preventive examination.  Review of Systems:  N/A Cardiac Risk Factors include: advanced age (>6men, >48 women);dyslipidemia;hypertension     Objective:     Vitals: BP 130/64 (BP Location: Left Arm, Patient Position: Sitting, Cuff Size: Normal)   Pulse 63   Temp 97.7 F (36.5 C) (Oral)   Ht 5' 2.5" (1.588 m) Comment: shoes  Wt 150 lb (68 kg)   SpO2 98%   BMI 27.00 kg/m   Body mass index is 27 kg/m.   Tobacco History  Smoking Status  . Never Smoker  Smokeless Tobacco  . Never Used     Counseling given: No   Past Medical History:  Diagnosis Date  . Basal cell carcinoma    removed  . Chest pain    a. 02/2017 MV: EF 54%, small, distal anterior wall/apical infacrt (likely misregistration), no ischemia-->Low risk; b. 02/2017 Echo: EF 55-60%, mild MR, PASP 89mmHg.  Marland Kitchen Chronic back pain    stenosis  . Constipation    takes Colace daily  . Depression    takes Zoloft daily  . Diverticulosis of colon   . GERD (gastroesophageal reflux disease)    atypical chest pain  . Gout    hx of;was on Allopurinol but taken off 6 wks ago by medical MD  . History of diverticulitis of colon   . HLD (hyperlipidemia)    takes Pravastatin daily  . HTN (hypertension)    takes Amlodipine and carvedilol  . OA (osteoarthritis)    right knee  . Osteopenia    takes Calcium and Vit D daily  . Vertigo    takes Antivert daily as needed   Past Surgical History:  Procedure Laterality Date  . APPENDECTOMY  at age 69  . BACK SURGERY    . CATARACT EXTRACTION Right 04/2006  . CHOLECYSTECTOMY    . COLONOSCOPY    . KNEE SURGERY Right    Medial meniscus tear  . LUMBAR LAMINECTOMY/DECOMPRESSION MICRODISCECTOMY Bilateral 11/13/2014   Procedure: Lumbar one-two Bilateral laminectomy and foraminotomy, Left Lumbar one-two microdiscectomy ;  Surgeon: Faythe Ghee, MD;  Location: [facility];  Service:  Neurosurgery;  Laterality: Bilateral;  . NECK SURGERY     x 2;fusion  . PARTIAL COLECTOMY  09/1999   d/t diverticulitis  . TUBAL LIGATION     Family History  Problem Relation Age of Onset  . Cancer Father        throat and bladder  . Diabetes Father   . Hypertension Father   . Heart attack Father   . Stroke Mother   . Alzheimer's disease Mother   . Hypertension Mother    History  Sexual Activity  . Sexual activity: No    Outpatient Encounter Prescriptions as of 05/11/2017  Medication Sig  . amLODipine (NORVASC) 10 MG tablet TAKE 1 TABLET DAILY  . aspirin EC 81 MG tablet Take 1 tablet (81 mg total) by mouth daily.  . Calcium Carbonate-Vitamin D (CALCIUM 500 + D) 500-125 MG-UNIT TABS Take by mouth.  . Cranberry 250 MG CAPS Take 1 capsule by mouth daily.    Marland Kitchen docusate sodium (COLACE) 100 MG capsule Take 100-200 mg by mouth 2 (two) times daily. 100 mg in the morning and 200 mg at night  . fish oil-omega-3 fatty acids 1000 MG capsule Take 1 g by mouth at bedtime.   . furosemide (LASIX) 20 MG tablet Take  one tablet by mouth daily as needed for swelling (Patient taking differently: Take 20 mg by mouth daily. )  . hydrochlorothiazide (MICROZIDE) 12.5 MG capsule Take 1 capsule (12.5 mg total) by mouth daily.  . meclizine (ANTIVERT) 25 MG tablet Take 25 mg by mouth 4 (four) times daily as needed for dizziness.   . pravastatin (PRAVACHOL) 40 MG tablet Take 1 tablet (40 mg total) by mouth daily. (Patient taking differently: Take 20 mg by mouth daily. )  . sertraline (ZOLOFT) 50 MG tablet TAKE ONE AND ONE-HALF TABLETS DAILY (Patient taking differently: Take 50mg  and 25mg  at night)  . Tetrahydrozoline HCl (EYE DROPS OP) Apply 1-2 drops to eye as needed (for dry eye).  . [DISCONTINUED] nitroGLYCERIN (NITROSTAT) 0.4 MG SL tablet Place 1 tablet (0.4 mg total) under the tongue every 5 (five) minutes as needed for chest pain. (Patient not taking: Reported on 05/11/2017)  . [DISCONTINUED] pantoprazole  (PROTONIX) 40 MG tablet Take 1 tablet (40 mg total) by mouth daily.   No facility-administered encounter medications on file as of 05/11/2017.     Activities of Daily Living In your present state of health, do you have any difficulty performing the following activities: 05/11/2017 03/06/2017  Hearing? Y N  Vision? N N  Comment - -  Difficulty concentrating or making decisions? N N  Walking or climbing stairs? Y Y  Dressing or bathing? N N  Doing errands, shopping? N Y  Conservation officer, nature and eating ? N -  Using the Toilet? N -  In the past six months, have you accidently leaked urine? Y -  Comment - -  Do you have problems with loss of bowel control? N -  Managing your Medications? N -  Managing your Finances? N -  Housekeeping or managing your Housekeeping? N -  Some recent data might be hidden    Patient Care Team: Tower, Wynelle Fanny, MD as PCP - Henrietta Dine, MD as Consulting Physician (Orthopedic Surgery)    Assessment:     Hearing Screening   125Hz  250Hz  500Hz  1000Hz  2000Hz  3000Hz  4000Hz  6000Hz  8000Hz   Right ear:   0 0 0  0    Left ear:   0 0 0  0    Vision Screening Comments: Last eye exam in Feb 2018   Exercise Activities and Dietary recommendations Current Exercise Habits: The patient does not participate in regular exercise at present, Exercise limited by: orthopedic condition(s)  Goals    . Increase water intake          Starting 05/11/2017, I will continue to drink at least 6-8 glasses of water daily.       Fall Risk Fall Risk  05/11/2017 07/07/2016 07/05/2015 06/27/2014 04/11/2013  Falls in the past year? Yes No Yes Yes Yes  Comment pt fell on back porch; injury to right eye - - - -  Number falls in past yr: 1 - 1 1 1   Injury with Fall? Yes - Yes No -  Risk for fall due to : - - - - Impaired balance/gait   Depression Screen PHQ 2/9 Scores 05/11/2017 07/07/2016 07/05/2015 06/27/2014  PHQ - 2 Score 0 0 0 0  PHQ- 9 Score 3 - - -     Cognitive Function MMSE  - Mini Mental State Exam 05/11/2017 07/07/2016  Orientation to time 5 5  Orientation to Place 5 5  Registration 3 3  Attention/ Calculation 0 0  Recall 3 3  Language- name 2 objects  0 0  Language- repeat 1 1  Language- follow 3 step command 3 3  Language- read & follow direction 0 0  Write a sentence 0 0  Copy design 0 0  Total score 20 20       PLEASE NOTE: A Mini-Cog screen was completed. Maximum score is 20. A value of 0 denotes this part of Folstein MMSE was not completed or the patient failed this part of the Mini-Cog screening.   Mini-Cog Screening Orientation to Time - Max 5 pts Orientation to Place - Max 5 pts Registration - Max 3 pts Recall - Max 3 pts Language Repeat - Max 1 pts Language Follow 3 Step Command - Max 3 pts   Immunization History  Administered Date(s) Administered  . Influenza Split 07/23/2011  . Influenza Whole 08/11/2004, 07/08/2007, 07/09/2008, 06/19/2009, 07/08/2010  . Influenza, High Dose Seasonal PF 07/31/2015, 06/30/2016  . Influenza-Unspecified 06/28/2013  . PPD Test 10/03/2015  . Pneumococcal Conjugate-13 06/27/2014  . Pneumococcal Polysaccharide-23 02/10/2012  . Td 08/28/1997, 02/10/2012   Screening Tests Health Maintenance  Topic Date Due  . INFLUENZA VACCINE  12/26/2017 (Originally 04/28/2017)  . MAMMOGRAM  08/31/2017  . TETANUS/TDAP  02/09/2022  . DEXA SCAN  Completed  . PNA vac Low Risk Adult  Completed      Plan:     I have personally reviewed and addressed the Medicare Annual Wellness questionnaire and have noted the following in the patient's chart:  A. Medical and social history B. Use of alcohol, tobacco or illicit drugs  C. Current medications and supplements D. Functional ability and status E.  Nutritional status F.  Physical activity G. Advance directives H. List of other physicians I.  Hospitalizations, surgeries, and ER visits in previous 12 months J.  [place] to include hearing, vision, cognitive,  depression L. Referrals and appointments - none  In addition, I have reviewed and discussed with patient certain preventive protocols, quality metrics, and best practice recommendations. A written personalized care plan for preventive services as well as general preventive health recommendations were provided to patient.  See attached scanned questionnaire for additional information.   Signed,   Lindell Noe, MHA, BS, LPN Health Coach

## 2017-05-18 ENCOUNTER — Encounter: Payer: Self-pay | Admitting: Family Medicine

## 2017-05-18 ENCOUNTER — Ambulatory Visit (INDEPENDENT_AMBULATORY_CARE_PROVIDER_SITE_OTHER): Payer: Medicare Other | Admitting: Family Medicine

## 2017-05-18 VITALS — BP 122/68 | HR 69 | Temp 98.5°F | Ht 62.5 in | Wt 150.5 lb

## 2017-05-18 DIAGNOSIS — M949 Disorder of cartilage, unspecified: Secondary | ICD-10-CM

## 2017-05-18 DIAGNOSIS — E785 Hyperlipidemia, unspecified: Secondary | ICD-10-CM | POA: Diagnosis not present

## 2017-05-18 DIAGNOSIS — M899 Disorder of bone, unspecified: Secondary | ICD-10-CM | POA: Diagnosis not present

## 2017-05-18 DIAGNOSIS — I10 Essential (primary) hypertension: Secondary | ICD-10-CM

## 2017-05-18 DIAGNOSIS — R739 Hyperglycemia, unspecified: Secondary | ICD-10-CM

## 2017-05-18 DIAGNOSIS — Z0000 Encounter for general adult medical examination without abnormal findings: Secondary | ICD-10-CM

## 2017-05-18 DIAGNOSIS — R001 Bradycardia, unspecified: Secondary | ICD-10-CM

## 2017-05-18 DIAGNOSIS — Z9181 History of falling: Secondary | ICD-10-CM

## 2017-05-18 DIAGNOSIS — N289 Disorder of kidney and ureter, unspecified: Secondary | ICD-10-CM | POA: Diagnosis not present

## 2017-05-18 DIAGNOSIS — I700 Atherosclerosis of aorta: Secondary | ICD-10-CM | POA: Diagnosis not present

## 2017-05-18 DIAGNOSIS — F418 Other specified anxiety disorders: Secondary | ICD-10-CM | POA: Diagnosis not present

## 2017-05-18 MED ORDER — SERTRALINE HCL 50 MG PO TABS: 75.0000 mg | ORAL_TABLET | Freq: Every day | ORAL | 3 refills | Status: DC

## 2017-05-18 MED ORDER — FUROSEMIDE 20 MG PO TABS: ORAL_TABLET | ORAL | 3 refills | Status: DC

## 2017-05-18 MED ORDER — PRAVASTATIN SODIUM 20 MG PO TABS: 20.0000 mg | ORAL_TABLET | Freq: Every day | ORAL | 3 refills | Status: DC

## 2017-05-18 MED ORDER — AMLODIPINE BESYLATE 10 MG PO TABS: 10.0000 mg | ORAL_TABLET | Freq: Every day | ORAL | 3 refills | Status: DC

## 2017-05-18 NOTE — Assessment & Plan Note (Signed)
bp in fair control at this time  BP Readings from Last 1 Encounters:  05/18/17 122/68    Disc lifstyle change with low sodium diet and exercise  She was taken off metoprolol due to low pulse Intolerant of hctz 12.5-plans to stop it  She will watch bp closely at home and update if readings go up and how she feels w/in 2 weeks

## 2017-05-18 NOTE — Assessment & Plan Note (Signed)
Working to control BP and cholesterol  Intolerant of statin and cannot get lipid to goal Disc healthy habits Continue to monitor

## 2017-05-18 NOTE — Assessment & Plan Note (Signed)
Reviewed health habits including diet and exercise and skin cancer prevention Reviewed appropriate screening tests for age  Also reviewed health mt list, fam hx and immunization status , as well as social and family history   See HPI  amw reviewed Declines hearing aides  Disc depression and tx  Labs reviewed  Flu shot planned for the fall

## 2017-05-18 NOTE — Assessment & Plan Note (Signed)
Pt struggles with this  On zoloft 75 mg daily  Wishes not to adv it  Declines counseling Reviewed stressors/ coping techniques/symptoms/ support sources/ tx options and side effects in detail today Disc imp of self care/outdoor time and socialization

## 2017-05-18 NOTE — Patient Instructions (Addendum)
Stop the hctz  Check your blood pressure at home  If it goes 140/90 or higher please let me know  If you do not feel better off the hctz let me know also (give it 2 weeks)  We will re evaluate from   Let us know if your hearing loss bothers you or if you are interested in hearing aides   Get outdoors when weather is good/ even just to sit -it is good for your mood Also socialize for mood and memory   Don't forget to get your flu shot in the fall   For cholesterol:  Avoid red meat/ fried foods/ egg yolks/ fatty breakfast meats/ butter, cheese and high fat dairy/ and shellfish

## 2017-05-18 NOTE — Assessment & Plan Note (Signed)
Lab Results  Component Value Date   HGBA1C 5.9 05/11/2017   This is stable disc imp of low glycemic diet and wt loss to prevent DM2

## 2017-05-18 NOTE — Assessment & Plan Note (Signed)
Osteopenia dexa 10/16 One fall- disc fall prev at home  No fx  On ca and D  Some exercise  Disc need for calcium/ vitamin D/ wt bearing exercise and bone density test every 2 y to monitor Disc safety/ fracture risk in detail

## 2017-05-18 NOTE — Assessment & Plan Note (Signed)
Disc fall prev in home and handout given  Using a cane

## 2017-05-18 NOTE — Progress Notes (Signed)
Subjective:    Patient ID: Linda Robinson, female    DOB: 07/05/1929, 83 y.o.   MRN: [id number]  HPI  Here for health maintenance exam and to review chronic medical problems    Dr Fletcher Anon swapped her metoprolol for 12.5 mg of hctz  She thinks the hctz makes her feel bad and weak  Just not feeling well in general  Weak all over and dizzy at times  Happens in the am  She does not feel like she needs to return to cardiology because she does not have heart problems    Wt Readings from Last 3 Encounters:  05/18/17 150 lb 8 oz (68.3 kg)  05/11/17 150 lb (68 kg)  03/16/17 152 lb 8 oz (69.2 kg)  eating healthy / avoiding junk and sweets (not much appetite) occ takes ensure  27.09 kg/m   Had amw on 8/14 Failed hearing exam -not interested in hearing aides yet  1 fall- fell on her back porch once this year - tripped over mop handle (lesson learned)  Uses a cane now  PHQ 3-- she says she has ups and downs , takes 1/1/2 of the sertraline (does not want to go up on it)    She will get a flu shot in fhe fall   Mammogram 12/17  Neg  Self breast exam - no lumps   dexa 10/16 Osteopenia of forearm (nl in hips) One fall  No fracture  Taking her calcium and D  Using cane and using fall precautions    bp is stable today  Hx of atherosclerosis of aorta  No cp or palpitations or headaches or edema  No side effects to medicines  BP Readings from Last 3 Encounters:  05/18/17 122/68  05/11/17 130/64  03/16/17 (!) 156/72   Dr Fletcher Anon took her off of metoprolol - she has not felt great since (? Due to low pulse)  She wants to stop the hctz because it makes her feel bad   Pulse Readings from Last 3 Encounters:  05/18/17 69  05/11/17 63  03/16/17 (!) 56     Takes microzide Lasix  Amlodipine  Hx of renal insuff Lab Results  Component Value Date   CREATININE 1.03 05/11/2017   BUN 21 05/11/2017   NA 137 05/11/2017   K 3.7 05/11/2017   CL 94 (L) 05/11/2017   CO2 35 (H)  05/11/2017    Lab Results  Component Value Date   ALT 9 05/11/2017   AST 18 05/11/2017   ALKPHOS 52 05/11/2017   BILITOT 0.6 05/11/2017    Hyperglycemia Lab Results  Component Value Date   HGBA1C 5.9 05/11/2017   This is stable from a year ago    Hyperlipidemia Lab Results  Component Value Date   CHOL 202 (H) 05/11/2017   CHOL 231 (H) 02/23/2017   CHOL 219 (H) 07/07/2016   Lab Results  Component Value Date   HDL 61.50 05/11/2017   HDL 65.70 02/23/2017   HDL 58.90 07/07/2016   Lab Results  Component Value Date   LDLCALC 112 (H) 05/11/2017   LDLCALC 141 (H) 02/23/2017   LDLCALC 139 (H) 07/07/2016   Lab Results  Component Value Date   TRIG 141.0 05/11/2017   TRIG 120.0 02/23/2017   TRIG 109.0 07/07/2016   Lab Results  Component Value Date   CHOLHDL 3 05/11/2017   CHOLHDL 4 02/23/2017   CHOLHDL 4 07/07/2016   Lab Results  Component Value Date   LDLDIRECT 135.4  04/11/2013   LDLDIRECT 134.0 02/03/2012   LDLDIRECT 150.8 10/30/2010   we tried to inc her pravastatin to 40  Did not tolerate it and went back to 20  LDL is not at goal but severely high  Lab Results  Component Value Date   WBC 9.9 05/11/2017   HGB 14.8 05/11/2017   HCT 44.0 05/11/2017   MCV 87.4 05/11/2017   PLT 321.0 05/11/2017    Lab Results  Component Value Date   TSH 2.27 05/11/2017     Patient Active Problem List   Diagnosis Date Noted  . Costochondritis 03/09/2017  . Atherosclerosis of aorta ([place]) 03/09/2017  . Weight loss 02/23/2017  . Routine general medical examination at a health care facility 07/05/2015  . Estrogen deficiency 07/05/2015  . Hyperglycemia 01/01/2015  . Lumbar spinal stenosis 11/13/2014  . Vertigo 10/05/2014  . Risk for falls 06/27/2014  . Low back pain on right side with sciatica 07/24/2013  . Edema 04/26/2013  . Encounter for Medicare annual wellness exam 04/11/2013  . Bradycardia 05/19/2010  . Disorder of bone and cartilage 01/30/2009  . CONSTIPATION  11/01/2007  . Depression with anxiety 02/14/2007  . Hyperlipidemia LDL goal <130 02/04/2007  . Gout 02/04/2007  . Essential hypertension 02/04/2007  . DIVERTICULOSIS, COLON 02/04/2007  . Renal insufficiency 02/04/2007  . OSTEOARTHRITIS 02/04/2007  . [place] DISEASE 02/04/2007   Past Medical History:  Diagnosis Date  . Basal cell carcinoma    removed  . Chest pain    a. 02/2017 MV: EF 54%, small, distal anterior wall/apical infacrt (likely misregistration), no ischemia-->Low risk; b. 02/2017 Echo: EF 55-60%, mild MR, PASP 63mmHg.  Marland Kitchen Chronic back pain    stenosis  . Constipation    takes Colace daily  . Depression    takes Zoloft daily  . Diverticulosis of colon   . GERD (gastroesophageal reflux disease)    atypical chest pain  . Gout    hx of;was on Allopurinol but taken off 6 wks ago by medical MD  . History of diverticulitis of colon   . HLD (hyperlipidemia)    takes Pravastatin daily  . HTN (hypertension)    takes Amlodipine and carvedilol  . OA (osteoarthritis)    right knee  . Osteopenia    takes Calcium and Vit D daily  . Vertigo    takes Antivert daily as needed   Past Surgical History:  Procedure Laterality Date  . APPENDECTOMY  at age 76  . BACK SURGERY    . CATARACT EXTRACTION Right 04/2006  . CHOLECYSTECTOMY    . COLONOSCOPY    . KNEE SURGERY Right    Medial meniscus tear  . LUMBAR LAMINECTOMY/DECOMPRESSION MICRODISCECTOMY Bilateral 11/13/2014   Procedure: Lumbar one-two Bilateral laminectomy and foraminotomy, Left Lumbar one-two microdiscectomy ;  Surgeon: Faythe Ghee, MD;  Location: [facility];  Service: Neurosurgery;  Laterality: Bilateral;  . NECK SURGERY     x 2;fusion  . PARTIAL COLECTOMY  09/1999   d/t diverticulitis  . TUBAL LIGATION     Social History  Substance Use Topics  . Smoking status: Never Smoker  . Smokeless tobacco: Never Used  . Alcohol use No   Family History  Problem Relation Age of Onset  . Cancer Father         throat and bladder  . Diabetes Father   . Hypertension Father   . Heart attack Father   . Stroke Mother   . Alzheimer's disease Mother   .  Hypertension Mother    Allergies  Allergen Reactions  . Ace Inhibitors     REACTION: increased K+, increased creat.  . Cephalosporins     REACTION: reaction not known  . Colesevelam     REACTION: constipation  . Hctz [Hydrochlorothiazide]     Makes her feel "bad"  . Lovastatin     REACTION: doesn't work  . Raloxifene     REACTION: cramps  . Rosuvastatin     REACTION: myalgia  . Simvastatin     REACTION: myalgia   Current Outpatient Prescriptions on File Prior to Visit  Medication Sig Dispense Refill  . aspirin EC 81 MG tablet Take 1 tablet (81 mg total) by mouth daily. 90 tablet 3  . Calcium Carbonate-Vitamin D (CALCIUM 500 + D) 500-125 MG-UNIT TABS Take by mouth.    . Cranberry 250 MG CAPS Take 1 capsule by mouth daily.      Marland Kitchen docusate sodium (COLACE) 100 MG capsule Take 100-200 mg by mouth 2 (two) times daily. 100 mg in the morning and 200 mg at night    . fish oil-omega-3 fatty acids 1000 MG capsule Take 1 g by mouth at bedtime.     . meclizine (ANTIVERT) 25 MG tablet Take 25 mg by mouth 4 (four) times daily as needed for dizziness.     . Tetrahydrozoline HCl (EYE DROPS OP) Apply 1-2 drops to eye as needed (for dry eye).     No current facility-administered medications on file prior to visit.      Review of Systems  Constitutional: Positive for appetite change and fatigue. Negative for activity change, fever and unexpected weight change.  HENT: Negative for congestion, ear pain, rhinorrhea, sinus pressure and sore throat.   Eyes: Negative for pain, redness and visual disturbance.  Respiratory: Negative for cough, shortness of breath and wheezing.   Cardiovascular: Negative for chest pain and palpitations.  Gastrointestinal: Negative for abdominal pain, blood in stool, constipation and diarrhea.  Endocrine: Negative for  polydipsia and polyuria.  Genitourinary: Negative for dysuria, frequency and urgency.  Musculoskeletal: Positive for arthralgias, back pain and gait problem. Negative for myalgias.  Skin: Negative for pallor and rash.  Allergic/Immunologic: Negative for environmental allergies.  Neurological: Negative for dizziness, syncope and headaches.  Hematological: Negative for adenopathy. Does not bruise/bleed easily.  Psychiatric/Behavioral: Positive for dysphoric mood. Negative for decreased concentration. The patient is not nervous/anxious.        Objective:   Physical Exam  Constitutional: She appears well-developed and well-nourished. No distress.  overwt and well appearing but seemingly fatigued   HENT:  Head: Normocephalic and atraumatic.  Right Ear: External ear normal.  Left Ear: External ear normal.  Mouth/Throat: Oropharynx is clear and moist.  Eyes: Pupils are equal, round, and reactive to light. Conjunctivae and EOM are normal. No scleral icterus.  Neck: Normal range of motion. Neck supple. No JVD present. Carotid bruit is not present. No thyromegaly present.  Cardiovascular: Normal rate, regular rhythm, normal heart sounds and intact distal pulses.  Exam reveals no gallop.   Pulmonary/Chest: Effort normal and breath sounds normal. No respiratory distress. She has no wheezes. She exhibits no tenderness.  Abdominal: Soft. Bowel sounds are normal. She exhibits no distension, no abdominal bruit and no mass. There is no tenderness.  Genitourinary: No breast swelling, tenderness, discharge or bleeding.  Genitourinary Comments: Breast exam: No mass, nodules, thickening, tenderness, bulging, retraction, inflamation, nipple discharge or skin changes noted.  No axillary or clavicular LA.  Musculoskeletal: Normal range of motion. She exhibits no edema or tenderness.  Lymphadenopathy:    She has no cervical adenopathy.  Neurological: She is alert. She has normal reflexes. No cranial nerve  deficit. She exhibits normal muscle tone. Coordination normal.  Skin: Skin is warm and dry. No rash noted. No erythema. No pallor.  Solar lentigines diffusely sks noted   Psychiatric: Her speech is normal and behavior is normal. Thought content normal. Her mood appears anxious. Her affect is not blunt, not labile and not inappropriate. Thought content is not paranoid. Cognition and memory are normal. She exhibits a depressed mood. She expresses no homicidal and no suicidal ideation.  Mentally sharp but negative/depressed affect           Assessment & Plan:   Problem List Items Addressed This Visit      Cardiovascular and Mediastinum   Atherosclerosis of aorta ([place]) - Primary    Working to control BP and cholesterol  Intolerant of statin and cannot get lipid to goal Disc healthy habits Continue to monitor       Relevant Medications   pravastatin (PRAVACHOL) 20 MG tablet   furosemide (LASIX) 20 MG tablet   amLODipine (NORVASC) 10 MG tablet   Essential hypertension (Chronic)    bp in fair control at this time  BP Readings from Last 1 Encounters:  05/18/17 122/68    Disc lifstyle change with low sodium diet and exercise  She was taken off metoprolol due to low pulse Intolerant of hctz 12.5-plans to stop it  She will watch bp closely at home and update if readings go up and how she feels w/in 2 weeks       Relevant Medications   pravastatin (PRAVACHOL) 20 MG tablet   furosemide (LASIX) 20 MG tablet   amLODipine (NORVASC) 10 MG tablet     Musculoskeletal and Integument   Disorder of bone and cartilage    Osteopenia dexa 10/16 One fall- disc fall prev at home  No fx  On ca and D  Some exercise  Disc need for calcium/ vitamin D/ wt bearing exercise and bone density test every 2 y to monitor Disc safety/ fracture risk in detail          Genitourinary   Renal insufficiency    Labs are improved  Enc good water intake         Other   Bradycardia    Improved off  metoprolol      Depression with anxiety    Pt struggles with this  On zoloft 75 mg daily  Wishes not to adv it  Declines counseling Reviewed stressors/ coping techniques/symptoms/ support sources/ tx options and side effects in detail today Disc imp of self care/outdoor time and socialization        Hyperglycemia    Lab Results  Component Value Date   HGBA1C 5.9 05/11/2017   This is stable disc imp of low glycemic diet and wt loss to prevent DM2       Hyperlipidemia LDL goal <130 (Chronic)    Disc goals for lipids and reasons to control them Rev labs with pt Rev low sat fat diet in detail Not at goal but pt does not tolerate more than 20 mg of pravastatin  Diet rev       Relevant Medications   pravastatin (PRAVACHOL) 20 MG tablet   furosemide (LASIX) 20 MG tablet   amLODipine (NORVASC) 10 MG tablet   Risk for falls  Disc fall prev in home and handout given  Using a cane       Routine general medical examination at a health care facility    Reviewed health habits including diet and exercise and skin cancer prevention Reviewed appropriate screening tests for age  Also reviewed health mt list, fam hx and immunization status , as well as social and family history   See HPI  amw reviewed Declines hearing aides  Disc depression and tx  Labs reviewed  Flu shot planned for the fall

## 2017-05-18 NOTE — Assessment & Plan Note (Signed)
Improved off metoprolol

## 2017-05-18 NOTE — Assessment & Plan Note (Signed)
Disc goals for lipids and reasons to control them Rev labs with pt Rev low sat fat diet in detail Not at goal but pt does not tolerate more than 20 mg of pravastatin  Diet rev

## 2017-05-18 NOTE — Assessment & Plan Note (Signed)
Labs are improved  Enc good water intake

## 2017-05-19 ENCOUNTER — Encounter: Payer: Self-pay | Admitting: Cardiovascular Disease

## 2017-05-20 ENCOUNTER — Encounter: Payer: Self-pay | Admitting: Family Medicine

## 2017-05-24 ENCOUNTER — Encounter: Payer: Self-pay | Admitting: Cardiovascular Disease

## 2017-05-24 ENCOUNTER — Ambulatory Visit (INDEPENDENT_AMBULATORY_CARE_PROVIDER_SITE_OTHER): Payer: Medicare Other | Admitting: Cardiovascular Disease

## 2017-05-24 VITALS — BP 150/76 | HR 95 | Ht 62.0 in | Wt 151.2 lb

## 2017-05-24 DIAGNOSIS — I10 Essential (primary) hypertension: Secondary | ICD-10-CM | POA: Diagnosis not present

## 2017-05-24 DIAGNOSIS — R0789 Other chest pain: Secondary | ICD-10-CM

## 2017-05-24 DIAGNOSIS — E785 Hyperlipidemia, unspecified: Secondary | ICD-10-CM | POA: Diagnosis not present

## 2017-05-24 MED ORDER — LOSARTAN POTASSIUM 25 MG PO TABS: 25.0000 mg | ORAL_TABLET | Freq: Every day | ORAL | 3 refills | Status: DC

## 2017-05-24 NOTE — Patient Instructions (Signed)
Medication Instructions:  Your physician has recommended you make the following change in your medication:  START taking losartan 25mg  once a day   Labwork: BMET in one week  Testing/Procedures: none  Follow-Up: Your physician recommends that you schedule a follow-up appointment as needed.    Any Other Special Instructions Will Be Listed Below (If Applicable).     If you need a refill on your cardiac medications before your next appointment, please call your pharmacy.

## 2017-05-24 NOTE — Progress Notes (Signed)
Cardiology Office Note   Date:  05/24/2017   ID:  Linda Robinson, DOB 04-Nov-1928, MRN [id number]  PCP:  Abner Greenspan, MD  Cardiologist:   Kathlyn Sacramento, MD   Chief Complaint  Patient presents with  . other    2-3 month follow up. Patient c/o chest pressure. Meds reviewedverbally with patient.       History of Present Illness: Linda Robinson is a 83 y.o. female who Is here today for a follow-up visit regarding chest pain. She has known history of hypertension and hyperlipidemia. She is not diabetic. She is a lifelong nonsmoker. She was seen by me in June for chest pain worrisome for unstable angina. I switched metoprolol to carvedilol due to uncontrolled hypertension and bradycardia. However, she did not tolerate the medication. Long-acting nitroglycerin was also added that she had a headache and was discontinued. She was subsequently hospitalized at [facility] for continued chest pain. She ruled out for myocardial infarction. She underwent a nuclear stress test which was suggestive of a possible small scar in the distal anterior wall without ischemia. EF was 54% and the study was overall low risk. She had an echocardiogram done which showed normal LV systolic function with mild mitral regurgitation. Pulmonary pressure was at the upper limit of normal. She continued to have uncontrolled hypertension and was started on small dose hydrochlorothiazide. Blood pressure was controlled on that medication but it made her feel bad and the medication was discontinued. She reports no significant chest pain at the present time and no shortness of breath. Biggest issue continues to be uncontrolled hypertension.   Past Medical History:  Diagnosis Date  . Basal cell carcinoma    removed  . Chest pain    a. 02/2017 MV: EF 54%, small, distal anterior wall/apical infacrt (likely misregistration), no ischemia-->Low risk; b. 02/2017 Echo: EF 55-60%, mild MR, PASP 22mmHg.  Marland Kitchen Chronic back pain    stenosis  . Constipation    takes Colace daily  . Depression    takes Zoloft daily  . Diverticulosis of colon   . GERD (gastroesophageal reflux disease)    atypical chest pain  . Gout    hx of;was on Allopurinol but taken off 6 wks ago by medical MD  . History of diverticulitis of colon   . HLD (hyperlipidemia)    takes Pravastatin daily  . HTN (hypertension)    takes Amlodipine and carvedilol  . OA (osteoarthritis)    right knee  . Osteopenia    takes Calcium and Vit D daily  . Vertigo    takes Antivert daily as needed    Past Surgical History:  Procedure Laterality Date  . APPENDECTOMY  at age 50  . BACK SURGERY    . CATARACT EXTRACTION Right 04/2006  . CHOLECYSTECTOMY    . COLONOSCOPY    . KNEE SURGERY Right    Medial meniscus tear  . LUMBAR LAMINECTOMY/DECOMPRESSION MICRODISCECTOMY Bilateral 11/13/2014   Procedure: Lumbar one-two Bilateral laminectomy and foraminotomy, Left Lumbar one-two microdiscectomy ;  Surgeon: Faythe Ghee, MD;  Location: [facility];  Service: Neurosurgery;  Laterality: Bilateral;  . NECK SURGERY     x 2;fusion  . PARTIAL COLECTOMY  09/1999   d/t diverticulitis  . TUBAL LIGATION       Current Outpatient Prescriptions  Medication Sig Dispense Refill  . amLODipine (NORVASC) 10 MG tablet Take 1 tablet (10 mg total) by mouth daily. 90 tablet 3  . aspirin EC 81  MG tablet Take 1 tablet (81 mg total) by mouth daily. 90 tablet 3  . Calcium Carbonate-Vitamin D (CALCIUM 500 + D) 500-125 MG-UNIT TABS Take by mouth.    . Cranberry 250 MG CAPS Take 1 capsule by mouth daily.      Marland Kitchen docusate sodium (COLACE) 100 MG capsule Take 100-200 mg by mouth 2 (two) times daily. 100 mg in the morning and 200 mg at night    . fish oil-omega-3 fatty acids 1000 MG capsule Take 1 g by mouth at bedtime.     . furosemide (LASIX) 20 MG tablet Take one tablet by mouth daily as needed for swelling 90 tablet 3  . meclizine (ANTIVERT) 25 MG tablet Take 25 mg by mouth 4  (four) times daily as needed for dizziness.     . pravastatin (PRAVACHOL) 20 MG tablet Take 1 tablet (20 mg total) by mouth daily. 90 tablet 3  . sertraline (ZOLOFT) 50 MG tablet Take 1.5 tablets (75 mg total) by mouth daily. 135 tablet 3  . Tetrahydrozoline HCl (EYE DROPS OP) Apply 1-2 drops to eye as needed (for dry eye).    Marland Kitchen losartan (COZAAR) 25 MG tablet Take 1 tablet (25 mg total) by mouth daily. 90 tablet 3   No current facility-administered medications for this visit.     Allergies:   Ace inhibitors; Cephalosporins; Colesevelam; Hctz [hydrochlorothiazide]; Lovastatin; Raloxifene; Rosuvastatin; and Simvastatin    Social History:  The patient  reports that she has never smoked. She has never used smokeless tobacco. She reports that she does not drink alcohol or use drugs.   Family History:  The patient's family history includes Alzheimer's disease in her mother; Cancer in her father; Diabetes in her father; Heart attack in her father; Hypertension in her father and mother; Stroke in her mother.    ROS:  Please see the history of present illness.   Otherwise, review of systems are positive for none.   All other systems are reviewed and negative.    PHYSICAL EXAM: VS:  BP (!) 150/76 (BP Location: Left Arm, Patient Position: Sitting, Cuff Size: Normal)   Pulse 95   Ht 5\' 2"  (1.575 m)   Wt 151 lb 4 oz (68.6 kg)   BMI 27.66 kg/m  , BMI Body mass index is 27.66 kg/m. GEN: Well nourished, well developed, in no acute distress  HEENT: normal  Neck: no JVD, carotid bruits, or masses Cardiac: RRR; no rubs, or gallops,no edema . There is one out of 6 systolic ejection murmur at the base Respiratory:  clear to auscultation bilaterally, normal work of breathing GI: soft, nontender, nondistended, + BS MS: no deformity or atrophy  Skin: warm and dry, no rash Neuro:  Strength and sensation are intact Psych: euthymic mood, full affect   EKG:  EKG is ordered today. The ekg ordered today  demonstrates Normal sinus rhythm with nonspecific T wave changes.   Recent Labs: 05/11/2017: ALT 9; BUN 21; Creatinine, Ser 1.03; Hemoglobin 14.8; Platelets 321.0; Potassium 3.7; Sodium 137; TSH 2.27    Lipid Panel    Component Value Date/Time   CHOL 202 (H) 05/11/2017 1154   TRIG 141.0 05/11/2017 1154   HDL 61.50 05/11/2017 1154   CHOLHDL 3 05/11/2017 1154   VLDL 28.2 05/11/2017 1154   LDLCALC 112 (H) 05/11/2017 1154   LDLDIRECT 135.4 04/11/2013 1154      Wt Readings from Last 3 Encounters:  05/24/17 151 lb 4 oz (68.6 kg)  05/18/17 150 lb 8  oz (68.3 kg)  05/11/17 150 lb (68 kg)       PAD Screen 03/01/2017  Previous PAD dx? No  Previous surgical procedure? No  Pain with walking? No  Feet/toe relief with dangling? No  Painful, non-healing ulcers? No  Extremities discolored? No      ASSESSMENT AND PLAN:  1.  Atypical chest pain: No recurrent symptoms. Cardiac workup in June was overall reassuring.  2. Essential hypertension: Blood pressure continues to be elevated. She is intolerant to multiple antihypertensive medications. I'm going to try smaller dose losartan 25 mg daily. Check basic metabolic profile in one week. She did have elevated creatinine and potassium on Ace inhibitors in the past but hopefully she can tolerate this. If she does not tolerate losartan, we can consider hydralazine or keeping her blood pressure considering her advanced age. She does not seem to tolerate medications very well.  3. Hyperlipidemia: Currently on pravastatin 20 mg daily.  4. Aortic atherosclerosis: I explained to her that this is somewhat expected in her age group.    Disposition:   FU with me as needed.  Signed,  Kathlyn Sacramento, MD  05/24/2017 4:23 PM    [place]

## 2017-05-27 ENCOUNTER — Telehealth: Payer: Self-pay | Admitting: Family Medicine

## 2017-05-27 DIAGNOSIS — I10 Essential (primary) hypertension: Secondary | ICD-10-CM

## 2017-05-27 NOTE — Telephone Encounter (Signed)
-----   Message from Ellamae Sia sent at 05/26/2017 11:26 AM EDT ----- Regarding: Lab orders for Wednesday, 9.5.18 Lab orders, no f/u appt

## 2017-05-27 NOTE — Telephone Encounter (Signed)
I think this is actually for Dr Fletcher Anon her cardiologist ?   An order for bmp was in - I put one in too just in case

## 2017-06-02 ENCOUNTER — Other Ambulatory Visit (INDEPENDENT_AMBULATORY_CARE_PROVIDER_SITE_OTHER): Payer: Medicare Other

## 2017-06-02 DIAGNOSIS — I10 Essential (primary) hypertension: Secondary | ICD-10-CM

## 2017-06-02 LAB — BASIC METABOLIC PANEL
BUN: 16 mg/dL (ref 6–23)
CALCIUM: 10.4 mg/dL (ref 8.4–10.5)
CO2: 32 mEq/L (ref 19–32)
Chloride: 99 mEq/L (ref 96–112)
Creatinine, Ser: 1.01 mg/dL (ref 0.40–1.20)
GFR: 55.2 mL/min — AB (ref >60.00)
Glucose, Bld: 99 mg/dL (ref 70–99)
POTASSIUM: 5.2 meq/L — AB (ref 3.5–5.1)
SODIUM: 138 meq/L (ref 135–145)

## 2017-06-04 ENCOUNTER — Telehealth: Payer: Self-pay | Admitting: *Deleted

## 2017-06-04 ENCOUNTER — Encounter: Payer: Self-pay | Admitting: Family Medicine

## 2017-06-04 MED ORDER — HYDRALAZINE HCL 25 MG PO TABS: 25.0000 mg | ORAL_TABLET | Freq: Three times a day (TID) | ORAL | 1 refills | Status: DC

## 2017-06-04 NOTE — Telephone Encounter (Signed)
Pt notified of lab results and Dr. Marliss Coots comments. Rx sent to pharmacy only did 1 month, pt wants to make sure she is staying on this does of med before we send a 3 month supply to her mail order, f/u appt scheduled and losartan added to allergy list

## 2017-06-04 NOTE — Telephone Encounter (Signed)
-----   Message from Abner Greenspan, MD sent at 06/03/2017  5:23 PM EDT ----- Please let pt know that her K is going up on the losartan Her cardiologist suggests trying hydralazine If she is ok with that - stop losartan and call in hydralazine 25 mg 1  Po QD #30 11 ref  Schedule f/u in 1 mo if she does not already have one with me or cardiology Thanks   Please add losartan to her intolerance list as causing inc K

## 2017-06-07 MED ORDER — HYDRALAZINE HCL 25 MG PO TABS: 25.0000 mg | ORAL_TABLET | Freq: Every day | ORAL | 1 refills | Status: DC

## 2017-06-07 NOTE — Addendum Note (Signed)
Addended by: Tammi Sou on: 06/07/2017 08:14 AM   Modules accepted: Orders

## 2017-06-08 ENCOUNTER — Telehealth: Payer: Self-pay | Admitting: Family Medicine

## 2017-06-08 NOTE — Telephone Encounter (Signed)
Called only # on file and no answer and no voicemail (kept ringing)

## 2017-06-08 NOTE — Telephone Encounter (Signed)
Try taking 1/2 of hydralazine in the am  We will see how bp is with this

## 2017-06-08 NOTE — Telephone Encounter (Signed)
Pt has 30' appt with  Dr Glori Bickers on 06/09/17 at 10 AM. I spoke with pt and she feels better now and thinks she will be OK to wait to see Dr Glori Bickers tomorrow. Pt will go to UC or ED if condition changes or worsens prior to appt. Today at 9 Am BP 111/60 and at 10 AM BP 137/80. Pt wants to know if should take 1/2 tab of hydralazine in the morning at 7:30 AM or wait until sees Dr Glori Bickers at 10 AM. Pt request cb.

## 2017-06-08 NOTE — Telephone Encounter (Signed)
[facility]  Patient Name: Linda Robinson  DOB: 02-10-1929    Initial Comment Caller states BP meds changed 2-3x; orders were wrong, 1 per day instead of 3 per day; 1 yesterday and 1 today 111/60 bp feels like going to pass out; fine until took meds; dizzy when stands;   Nurse Assessment  Nurse: Harlow Mares, RN, Suanne Marker Date/Time (Eastern Time): 06/08/2017 9:39:00 AM  Confirm and document reason for call. If symptomatic, describe symptoms. ---Caller states BP meds changed 2-3x; orders were wrong, 1 per day instead of 3 per day; 1 yesterday and 1 today 111/60 bp feels like going to pass out; fine until took meds; dizzy when stands;  Does the patient have any new or worsening symptoms? ---Yes  Will a triage be completed? ---Yes  Related visit to physician within the last 2 weeks? ---No  Does the PT have any chronic conditions? (i.e. diabetes, asthma, etc.) ---Yes  List chronic conditions. ---HTN; GERD  Is this a behavioral health or substance abuse call? ---No     Guidelines    Guideline Title Affirmed Question Affirmed Notes  Dizziness - Lightheadedness SEVERE dizziness (e.g., unable to stand, requires support to walk, feels like passing out now)    Final Disposition User   Go to ED Now (or PCP triage) Harlow Mares, RN, Rhonda    Comments  Caller refused [facility]/ED since no appts available today at [facility] or [facility]. Scheduled appt with Golden Hurter for 06/09/17 at [place] REFUSED   Disagree/Comply: Disagree  Disagree/Comply Reason: Wait and see

## 2017-06-09 ENCOUNTER — Ambulatory Visit: Payer: Self-pay | Admitting: Internal Medicine

## 2017-06-09 ENCOUNTER — Encounter: Payer: Self-pay | Admitting: Family Medicine

## 2017-06-09 ENCOUNTER — Ambulatory Visit (INDEPENDENT_AMBULATORY_CARE_PROVIDER_SITE_OTHER): Payer: Medicare Other | Admitting: Family Medicine

## 2017-06-09 VITALS — BP 130/70 | HR 61 | Temp 98.2°F | Ht 62.0 in | Wt 149.2 lb

## 2017-06-09 DIAGNOSIS — I10 Essential (primary) hypertension: Secondary | ICD-10-CM

## 2017-06-09 NOTE — Assessment & Plan Note (Signed)
After many medication intolerances (as well as elevated K from ace and arb and low pulse from beta blockers)  Trying hydralazine 25 mg  She felt dizzy at full pill-= so starting with 1/2 pill daily watching bp  Feels better today  Will watch out for side eff and HTN  Rev cardiology notes Disc goals for bp  Enc water intake and less processed foods

## 2017-06-09 NOTE — Progress Notes (Signed)
Subjective:    Patient ID: Linda Robinson, female    DOB: Aug 14, 1929, 83 y.o.   MRN: [id number]  HPI  Here for f/u of dizziness/bp issues   Wt Readings from Last 3 Encounters:  06/09/17 149 lb 4 oz (67.7 kg)  05/24/17 151 lb 4 oz (68.6 kg)  05/18/17 150 lb 8 oz (68.3 kg)     BP Readings from Last 3 Encounters:  06/09/17 (!) 148/62  05/24/17 (!) 150/76  05/18/17 122/68   The ARB caused inc K  Lab Results  Component Value Date   CREATININE 1.01 06/02/2017   BUN 16 06/02/2017   NA 138 06/02/2017   K 5.2 (H) 06/02/2017   CL 99 06/02/2017   CO2 32 06/02/2017   Had to switch to hydralazine (cardiology suggested)   Intolerant of many medicines  Feels a little shaky     Pulse Readings from Last 3 Encounters:  06/09/17 61  05/24/17 95  05/18/17 69     On 1 hydralazine per day  123/60 137/80 128/62 111/60    Patient Active Problem List   Diagnosis Date Noted  . Costochondritis 03/09/2017  . Atherosclerosis of aorta ([place]) 03/09/2017  . Weight loss 02/23/2017  . Routine general medical examination at a health care facility 07/05/2015  . Estrogen deficiency 07/05/2015  . Hyperglycemia 01/01/2015  . Lumbar spinal stenosis 11/13/2014  . Vertigo 10/05/2014  . Risk for falls 06/27/2014  . Low back pain on right side with sciatica 07/24/2013  . Edema 04/26/2013  . Encounter for Medicare annual wellness exam 04/11/2013  . Bradycardia 05/19/2010  . Disorder of bone and cartilage 01/30/2009  . CONSTIPATION 11/01/2007  . Depression with anxiety 02/14/2007  . Hyperlipidemia LDL goal <130 02/04/2007  . Gout 02/04/2007  . Essential hypertension 02/04/2007  . DIVERTICULOSIS, COLON 02/04/2007  . Renal insufficiency 02/04/2007  . OSTEOARTHRITIS 02/04/2007  . [place] DISEASE 02/04/2007   Past Medical History:  Diagnosis Date  . Basal cell carcinoma    removed  . Chest pain    a. 02/2017 MV: EF 54%, small, distal anterior wall/apical infacrt (likely  misregistration), no ischemia-->Low risk; b. 02/2017 Echo: EF 55-60%, mild MR, PASP 10mmHg.  Marland Kitchen Chronic back pain    stenosis  . Constipation    takes Colace daily  . Depression    takes Zoloft daily  . Diverticulosis of colon   . GERD (gastroesophageal reflux disease)    atypical chest pain  . Gout    hx of;was on Allopurinol but taken off 6 wks ago by medical MD  . History of diverticulitis of colon   . HLD (hyperlipidemia)    takes Pravastatin daily  . HTN (hypertension)    takes Amlodipine and carvedilol  . OA (osteoarthritis)    right knee  . Osteopenia    takes Calcium and Vit D daily  . Vertigo    takes Antivert daily as needed   Past Surgical History:  Procedure Laterality Date  . APPENDECTOMY  at age 85  . BACK SURGERY    . CATARACT EXTRACTION Right 04/2006  . CHOLECYSTECTOMY    . COLONOSCOPY    . KNEE SURGERY Right    Medial meniscus tear  . LUMBAR LAMINECTOMY/DECOMPRESSION MICRODISCECTOMY Bilateral 11/13/2014   Procedure: Lumbar one-two Bilateral laminectomy and foraminotomy, Left Lumbar one-two microdiscectomy ;  Surgeon: Faythe Ghee, MD;  Location: [facility];  Service: Neurosurgery;  Laterality: Bilateral;  . NECK SURGERY     x 2;fusion  .  PARTIAL COLECTOMY  09/1999   d/t diverticulitis  . TUBAL LIGATION     Social History  Substance Use Topics  . Smoking status: Never Smoker  . Smokeless tobacco: Never Used  . Alcohol use No   Family History  Problem Relation Age of Onset  . Cancer Father        throat and bladder  . Diabetes Father   . Hypertension Father   . Heart attack Father   . Stroke Mother   . Alzheimer's disease Mother   . Hypertension Mother    Allergies  Allergen Reactions  . Ace Inhibitors     REACTION: increased K+, increased creat.  . Cephalosporins     REACTION: reaction not known  . Colesevelam     REACTION: constipation  . Hctz [Hydrochlorothiazide]     Makes her feel "bad"  . Losartan     Increased K  . Lovastatin      REACTION: doesn't work  . Raloxifene     REACTION: cramps  . Rosuvastatin     REACTION: myalgia  . Simvastatin     REACTION: myalgia   Current Outpatient Prescriptions on File Prior to Visit  Medication Sig Dispense Refill  . amLODipine (NORVASC) 10 MG tablet Take 1 tablet (10 mg total) by mouth daily. 90 tablet 3  . aspirin EC 81 MG tablet Take 1 tablet (81 mg total) by mouth daily. 90 tablet 3  . Calcium Carbonate-Vitamin D (CALCIUM 500 + D) 500-125 MG-UNIT TABS Take by mouth.    . Cranberry 250 MG CAPS Take 1 capsule by mouth daily.      Marland Kitchen docusate sodium (COLACE) 100 MG capsule Take 100-200 mg by mouth 2 (two) times daily. 100 mg in the morning and 200 mg at night    . fish oil-omega-3 fatty acids 1000 MG capsule Take 1 g by mouth at bedtime.     . furosemide (LASIX) 20 MG tablet Take one tablet by mouth daily as needed for swelling 90 tablet 3  . hydrALAZINE (APRESOLINE) 25 MG tablet Take 1 tablet (25 mg total) by mouth daily. (Patient taking differently: Take 12.5 mg by mouth daily. ) 30 tablet 1  . meclizine (ANTIVERT) 25 MG tablet Take 25 mg by mouth 4 (four) times daily as needed for dizziness.     . pravastatin (PRAVACHOL) 20 MG tablet Take 1 tablet (20 mg total) by mouth daily. 90 tablet 3  . sertraline (ZOLOFT) 50 MG tablet Take 1.5 tablets (75 mg total) by mouth daily. 135 tablet 3  . Tetrahydrozoline HCl (EYE DROPS OP) Apply 1-2 drops to eye as needed (for dry eye).     No current facility-administered medications on file prior to visit.      Review of Systems  Constitutional: Negative for activity change, appetite change, fatigue, fever and unexpected weight change.  HENT: Negative for congestion, ear pain, rhinorrhea, sinus pressure and sore throat.   Eyes: Negative for pain, redness and visual disturbance.  Respiratory: Negative for cough, shortness of breath and wheezing.   Cardiovascular: Negative for chest pain and palpitations.  Gastrointestinal: Negative for  abdominal pain, blood in stool, constipation and diarrhea.  Endocrine: Negative for polydipsia and polyuria.  Genitourinary: Negative for dysuria, frequency and urgency.  Musculoskeletal: Positive for back pain. Negative for arthralgias and myalgias.  Skin: Negative for pallor and rash.  Allergic/Immunologic: Negative for environmental allergies.  Neurological: Negative for dizziness, syncope, light-headedness and headaches.       No longer  light headed or dizzy  Hematological: Negative for adenopathy. Does not bruise/bleed easily.  Psychiatric/Behavioral: Negative for decreased concentration and dysphoric mood. The patient is nervous/anxious.        Feels generally "shaky" today       Objective:   Physical Exam  Constitutional: She appears well-developed and well-nourished. No distress.  Well appearing   HENT:  Head: Normocephalic and atraumatic.  Mouth/Throat: Oropharynx is clear and moist.  Eyes: Pupils are equal, round, and reactive to light. Conjunctivae and EOM are normal.  Neck: Normal range of motion. Neck supple. No JVD present. Carotid bruit is not present. No thyromegaly present.  Cardiovascular: Normal rate, regular rhythm, normal heart sounds and intact distal pulses.  Exam reveals no gallop.   Pulmonary/Chest: Effort normal and breath sounds normal. No respiratory distress. She has no wheezes. She has no rales.  No crackles  Abdominal: Soft. Bowel sounds are normal. She exhibits no distension, no abdominal bruit and no mass. There is no tenderness.  Musculoskeletal: She exhibits no edema.  Walks with cane due to chronic back pain   Lymphadenopathy:    She has no cervical adenopathy.  Neurological: She is alert. She has normal reflexes. No cranial nerve deficit. She exhibits normal muscle tone. Coordination normal.  Skin: Skin is warm and dry. No rash noted. No pallor.  Psychiatric: Her speech is normal and behavior is normal. Thought content normal. Her mood appears  anxious. Her affect is not blunt, not labile and not inappropriate. She does not exhibit a depressed mood.  Mildly anxious but pleasant           Assessment & Plan:   Problem List Items Addressed This Visit      Cardiovascular and Mediastinum   Essential hypertension - Primary (Chronic)    After many medication intolerances (as well as elevated K from ace and arb and low pulse from beta blockers)  Trying hydralazine 25 mg  She felt dizzy at full pill-= so starting with 1/2 pill daily watching bp  Feels better today  Will watch out for side eff and HTN  Rev cardiology notes Disc goals for bp  Enc water intake and less processed foods

## 2017-06-09 NOTE — Patient Instructions (Signed)
Continue hydralazine at 1/2 pill once daily   bp is 130/70 now  Check bp at home several times weekly or more if you feel funny   Take care of yourself  If significant side effects develop please let us know  If you develop BP at 90/50 or below let us know

## 2017-06-09 NOTE — Telephone Encounter (Signed)
Spoke with pt and she did take 1/2 tab of med this morning

## 2017-06-13 ENCOUNTER — Other Ambulatory Visit: Payer: Self-pay | Admitting: Family Medicine

## 2017-06-15 ENCOUNTER — Ambulatory Visit: Payer: Medicare Other | Admitting: Nurse Practitioner

## 2017-06-18 ENCOUNTER — Ambulatory Visit: Payer: Medicare Other | Admitting: Cardiovascular Disease

## 2017-06-18 ENCOUNTER — Ambulatory Visit: Payer: Medicare Other

## 2017-06-23 ENCOUNTER — Encounter: Payer: Medicare Other | Admitting: Family Medicine

## 2017-07-02 ENCOUNTER — Ambulatory Visit (INDEPENDENT_AMBULATORY_CARE_PROVIDER_SITE_OTHER): Payer: Medicare Other | Admitting: Family Medicine

## 2017-07-02 ENCOUNTER — Encounter: Payer: Self-pay | Admitting: Family Medicine

## 2017-07-02 VITALS — BP 128/68 | HR 71 | Temp 97.9°F | Ht 62.0 in | Wt 149.8 lb

## 2017-07-02 DIAGNOSIS — F418 Other specified anxiety disorders: Secondary | ICD-10-CM | POA: Diagnosis not present

## 2017-07-02 DIAGNOSIS — I10 Essential (primary) hypertension: Secondary | ICD-10-CM

## 2017-07-02 MED ORDER — SERTRALINE HCL 100 MG PO TABS: 100.0000 mg | ORAL_TABLET | Freq: Every day | ORAL | 3 refills | Status: DC

## 2017-07-02 MED ORDER — HYDRALAZINE HCL 25 MG PO TABS: 12.5000 mg | ORAL_TABLET | Freq: Every day | ORAL | 3 refills | Status: DC

## 2017-07-02 NOTE — Progress Notes (Signed)
Subjective:    Patient ID: Linda Robinson, female    DOB: 1929/02/01, 83 y.o.   MRN: [id number]  HPI Here for f/u of HTN   Last visit after noting many med intolerances- started hydralazine for HTN 25 mg- started with 1/2 pill -then up to a whole A few days she went back down to 1/2 (which she is on now)   She feels washed out /fatigued when it is "too low"- 107 is too low for her  Does not want to fall or pass out   Wants to stick with the 1/2 pill   BP are labile ranging from 107/61 to 166/70 Pulse is in the 60s   BP Readings from Last 3 Encounters:  07/02/17 128/68  06/09/17 130/70  05/24/17 (!) 150/76     Wt Readings from Last 3 Encounters:  07/02/17 149 lb 12 oz (67.9 kg)  06/09/17 149 lb 4 oz (67.7 kg)  05/24/17 151 lb 4 oz (68.6 kg)    Also more shaky/nervous lately  Stress-- one thing after the other in the family  A little down as well    She is frustrated by inability to get much done  Has to do less house work at a time without breaks   [place] grand daughter had a car accident [place] son has financial problems Son has parkinson's  Husband is diabetic and he does not take care of himself - (refuses to) - this is frustrating (he takes his insulin and will not eat)    Patient Active Problem List   Diagnosis Date Noted  . Costochondritis 03/09/2017  . Atherosclerosis of aorta ([place]) 03/09/2017  . Weight loss 02/23/2017  . Routine general medical examination at a health care facility 07/05/2015  . Estrogen deficiency 07/05/2015  . Hyperglycemia 01/01/2015  . Lumbar spinal stenosis 11/13/2014  . Vertigo 10/05/2014  . Risk for falls 06/27/2014  . Low back pain on right side with sciatica 07/24/2013  . Edema 04/26/2013  . Encounter for Medicare annual wellness exam 04/11/2013  . Bradycardia 05/19/2010  . Disorder of bone and cartilage 01/30/2009  . CONSTIPATION 11/01/2007  . Depression with anxiety 02/14/2007  . Hyperlipidemia LDL goal <130  02/04/2007  . Gout 02/04/2007  . Essential hypertension 02/04/2007  . DIVERTICULOSIS, COLON 02/04/2007  . Renal insufficiency 02/04/2007  . OSTEOARTHRITIS 02/04/2007  . [place] DISEASE 02/04/2007   Past Medical History:  Diagnosis Date  . Basal cell carcinoma    removed  . Chest pain    a. 02/2017 MV: EF 54%, small, distal anterior wall/apical infacrt (likely misregistration), no ischemia-->Low risk; b. 02/2017 Echo: EF 55-60%, mild MR, PASP 70mmHg.  Marland Kitchen Chronic back pain    stenosis  . Constipation    takes Colace daily  . Depression    takes Zoloft daily  . Diverticulosis of colon   . GERD (gastroesophageal reflux disease)    atypical chest pain  . Gout    hx of;was on Allopurinol but taken off 6 wks ago by medical MD  . History of diverticulitis of colon   . HLD (hyperlipidemia)    takes Pravastatin daily  . HTN (hypertension)    takes Amlodipine and carvedilol  . OA (osteoarthritis)    right knee  . Osteopenia    takes Calcium and Vit D daily  . Vertigo    takes Antivert daily as needed   Past Surgical History:  Procedure Laterality Date  . APPENDECTOMY  at age 30  .  BACK SURGERY    . CATARACT EXTRACTION Right 04/2006  . CHOLECYSTECTOMY    . COLONOSCOPY    . KNEE SURGERY Right    Medial meniscus tear  . LUMBAR LAMINECTOMY/DECOMPRESSION MICRODISCECTOMY Bilateral 11/13/2014   Procedure: Lumbar one-two Bilateral laminectomy and foraminotomy, Left Lumbar one-two microdiscectomy ;  Surgeon: Faythe Ghee, MD;  Location: [facility];  Service: Neurosurgery;  Laterality: Bilateral;  . NECK SURGERY     x 2;fusion  . PARTIAL COLECTOMY  09/1999   d/t diverticulitis  . TUBAL LIGATION     Social History  Substance Use Topics  . Smoking status: Never Smoker  . Smokeless tobacco: Never Used  . Alcohol use No   Family History  Problem Relation Age of Onset  . Cancer Father        throat and bladder  . Diabetes Father   . Hypertension Father   . Heart  attack Father   . Stroke Mother   . Alzheimer's disease Mother   . Hypertension Mother    Allergies  Allergen Reactions  . Ace Inhibitors     REACTION: increased K+, increased creat.  . Cephalosporins     REACTION: reaction not known  . Colesevelam     REACTION: constipation  . Hctz [Hydrochlorothiazide]     Makes her feel "bad"  . Losartan     Increased K  . Lovastatin     REACTION: doesn't work  . Raloxifene     REACTION: cramps  . Rosuvastatin     REACTION: myalgia  . Simvastatin     REACTION: myalgia   Current Outpatient Prescriptions on File Prior to Visit  Medication Sig Dispense Refill  . amLODipine (NORVASC) 10 MG tablet Take 1 tablet (10 mg total) by mouth daily. 90 tablet 3  . aspirin EC 81 MG tablet Take 1 tablet (81 mg total) by mouth daily. 90 tablet 3  . Calcium Carbonate-Vitamin D (CALCIUM 500 + D) 500-125 MG-UNIT TABS Take by mouth.    . Cranberry 250 MG CAPS Take 1 capsule by mouth daily.      Marland Kitchen docusate sodium (COLACE) 100 MG capsule Take 100-200 mg by mouth 2 (two) times daily. 100 mg in the morning and 200 mg at night    . fish oil-omega-3 fatty acids 1000 MG capsule Take 1 g by mouth at bedtime.     . furosemide (LASIX) 20 MG tablet Take one tablet by mouth daily as needed for swelling 90 tablet 3  . meclizine (ANTIVERT) 25 MG tablet Take 25 mg by mouth 4 (four) times daily as needed for dizziness.     . pravastatin (PRAVACHOL) 20 MG tablet Take 1 tablet (20 mg total) by mouth daily. 90 tablet 3  . Tetrahydrozoline HCl (EYE DROPS OP) Apply 1-2 drops to eye as needed (for dry eye).     No current facility-administered medications on file prior to visit.     Review of Systems  Constitutional: Negative for activity change, appetite change, fatigue, fever and unexpected weight change.  HENT: Negative for congestion, ear pain, rhinorrhea, sinus pressure and sore throat.   Eyes: Negative for pain, redness and visual disturbance.  Respiratory: Negative for  cough, shortness of breath and wheezing.   Cardiovascular: Negative for chest pain and palpitations.  Gastrointestinal: Negative for abdominal pain, blood in stool, constipation and diarrhea.       Pos for heartburn symptoms   Endocrine: Negative for polydipsia and polyuria.  Genitourinary: Negative for dysuria, frequency  and urgency.  Musculoskeletal: Negative for arthralgias, back pain and myalgias.  Skin: Negative for pallor and rash.  Allergic/Immunologic: Negative for environmental allergies.  Neurological: Negative for dizziness, syncope and headaches.  Hematological: Negative for adenopathy. Does not bruise/bleed easily.  Psychiatric/Behavioral: Negative for decreased concentration and dysphoric mood. The patient is not nervous/anxious.        Objective:   Physical Exam  Constitutional: She appears well-developed and well-nourished. No distress.  overwt and well app  HENT:  Head: Normocephalic and atraumatic.  Mouth/Throat: Oropharynx is clear and moist.  Eyes: Pupils are equal, round, and reactive to light. Conjunctivae and EOM are normal.  Neck: Normal range of motion. Neck supple. No JVD present. Carotid bruit is not present. No thyromegaly present.  Cardiovascular: Normal rate, regular rhythm, normal heart sounds and intact distal pulses.  Exam reveals no gallop.   Pulmonary/Chest: Effort normal and breath sounds normal. No respiratory distress. She has no wheezes. She has no rales.  No crackles  Abdominal: Soft. Bowel sounds are normal. She exhibits no distension, no abdominal bruit and no mass. There is no tenderness.  Musculoskeletal: She exhibits no edema.  Lymphadenopathy:    She has no cervical adenopathy.  Neurological: She is alert. She has normal reflexes.  Skin: Skin is warm and dry. No rash noted.  Psychiatric: Thought content normal. Her mood appears anxious. Cognition and memory are normal. She does not exhibit a depressed mood.  Talks candidly about  stressors  Not tearful          Assessment & Plan:   Problem List Items Addressed This Visit      Cardiovascular and Mediastinum   Essential hypertension - Primary (Chronic)    bp in fair control at this time  BP Readings from Last 1 Encounters:  07/02/17 128/68   No changes needed Disc lifstyle change with low sodium diet and exercise  Will continue hydralazine 1/2 pill and alert if more bp lability or any symptoms of hypotension Urged to keep hydrated       Relevant Medications   hydrALAZINE (APRESOLINE) 25 MG tablet     Other   Depression with anxiety   Relevant Medications   sertraline (ZOLOFT) 100 MG tablet

## 2017-07-02 NOTE — Patient Instructions (Addendum)
Have a candid talk with your husband and let him know that his refusal to take care of himself is taking its toll on you  (he needs to know this)   Continue hydralazine 12.5 mg daily (that is 1/2 pill) If any problems please let me know   Increase your zoloft to 100 mg once daily  If any side effects let me know  Take care of yourself  Let me know if you want to see a counselor   Get zantac 75 mg over the counter for stomach acid- you can take it up to twice daily

## 2017-07-03 ENCOUNTER — Other Ambulatory Visit: Payer: Self-pay | Admitting: Family Medicine

## 2017-07-04 NOTE — Assessment & Plan Note (Signed)
bp in fair control at this time  BP Readings from Last 1 Encounters:  07/02/17 128/68   No changes needed Disc lifstyle change with low sodium diet and exercise  Will continue hydralazine 1/2 pill and alert if more bp lability or any symptoms of hypotension Urged to keep hydrated

## 2017-07-09 ENCOUNTER — Ambulatory Visit: Payer: Medicare Other

## 2017-07-09 ENCOUNTER — Telehealth: Payer: Self-pay

## 2017-07-09 MED ORDER — HYDRALAZINE HCL 25 MG PO TABS: 12.5000 mg | ORAL_TABLET | Freq: Every day | ORAL | 0 refills | Status: DC

## 2017-07-09 MED ORDER — HYDRALAZINE HCL 25 MG PO TABS: 12.5000 mg | ORAL_TABLET | Freq: Every day | ORAL | 3 refills | Status: DC

## 2017-07-09 NOTE — Telephone Encounter (Addendum)
Pt request 30 day hydralazine [place] to [place] and [facility]. Spoke with Bea at [facility] and cancelled # [phone] x 3. Advised pt done.

## 2017-07-13 ENCOUNTER — Ambulatory Visit: Payer: Medicare Other

## 2017-07-16 ENCOUNTER — Encounter: Payer: Medicare Other | Admitting: Family Medicine

## 2017-07-26 ENCOUNTER — Other Ambulatory Visit: Payer: Self-pay | Admitting: Family Medicine

## 2017-08-10 ENCOUNTER — Other Ambulatory Visit: Payer: Self-pay | Admitting: *Deleted

## 2017-08-10 MED ORDER — AMLODIPINE BESYLATE 10 MG PO TABS: 10.0000 mg | ORAL_TABLET | Freq: Every day | ORAL | 2 refills | Status: DC

## 2017-08-16 ENCOUNTER — Encounter: Payer: Self-pay | Admitting: Internal Medicine

## 2017-08-16 ENCOUNTER — Ambulatory Visit (INDEPENDENT_AMBULATORY_CARE_PROVIDER_SITE_OTHER): Payer: Medicare Other | Admitting: Internal Medicine

## 2017-08-16 ENCOUNTER — Ambulatory Visit: Payer: Medicare Other | Admitting: Family Medicine

## 2017-08-16 VITALS — BP 138/80 | HR 76 | Temp 98.2°F | Wt 149.0 lb

## 2017-08-16 DIAGNOSIS — R072 Precordial pain: Secondary | ICD-10-CM | POA: Diagnosis not present

## 2017-08-16 MED ORDER — OMEPRAZOLE 20 MG PO CPDR: 20.0000 mg | DELAYED_RELEASE_CAPSULE | Freq: Every day | ORAL | 3 refills | Status: DC

## 2017-08-16 NOTE — Progress Notes (Signed)
Subjective:    Patient ID: Linda Robinson, female    DOB: 1928/11/09, 83 y.o.   MRN: [id number]  HPI Here due to chest pain  Back in June--had slight hurting substernally Saw Dr Tye [place] heart Was OTC acid medication prn---but then stopped because symptoms were better  Now having substernal pain again over the past week Really bad 2 days Notices it a couple of hours after breakfast  Concerned it was related to the hydralazine--so she cut the dose in 1/2 again (just took 1/4) Not hurting as bad today  No SOB Does note sensitivity when she pushes on sternum  Current Outpatient Medications on File Prior to Visit  Medication Sig Dispense Refill  . amLODipine (NORVASC) 10 MG tablet Take 1 tablet (10 mg total) daily by mouth. 90 tablet 2  . aspirin EC 81 MG tablet Take 1 tablet (81 mg total) by mouth daily. 90 tablet 3  . Calcium Carbonate-Vitamin D (CALCIUM 500 + D) 500-125 MG-UNIT TABS Take by mouth.    . Cranberry 250 MG CAPS Take 1 capsule by mouth daily.      Marland Kitchen docusate sodium (COLACE) 100 MG capsule Take 100-200 mg by mouth 2 (two) times daily. 100 mg in the morning and 200 mg at night    . fish oil-omega-3 fatty acids 1000 MG capsule Take 1 g by mouth at bedtime.     . furosemide (LASIX) 20 MG tablet TAKE 1 TABLET DAILY AS NEEDED FOR SWELLING 90 tablet 2  . hydrALAZINE (APRESOLINE) 25 MG tablet Take 0.5 tablets (12.5 mg total) by mouth daily. 45 tablet 3  . meclizine (ANTIVERT) 25 MG tablet Take 25 mg by mouth 4 (four) times daily as needed for dizziness.     . pravastatin (PRAVACHOL) 20 MG tablet TAKE 1 TABLET DAILY 90 tablet 2  . sertraline (ZOLOFT) 100 MG tablet Take 1 tablet (100 mg total) by mouth daily. (Patient taking differently: Take 200 mg daily by mouth. ) 90 tablet 3  . Tetrahydrozoline HCl (EYE DROPS OP) Apply 1-2 drops to eye as needed (for dry eye).     No current facility-administered medications on file prior to visit.     Allergies  Allergen Reactions    . Ace Inhibitors     REACTION: increased K+, increased creat.  . Cephalosporins     REACTION: reaction not known  . Colesevelam     REACTION: constipation  . Hctz [Hydrochlorothiazide]     Makes her feel "bad"  . Losartan     Increased K  . Lovastatin     REACTION: doesn't work  . Raloxifene     REACTION: cramps  . Rosuvastatin     REACTION: myalgia  . Simvastatin     REACTION: myalgia    Past Medical History:  Diagnosis Date  . Basal cell carcinoma    removed  . Chest pain    a. 02/2017 MV: EF 54%, small, distal anterior wall/apical infacrt (likely misregistration), no ischemia-->Low risk; b. 02/2017 Echo: EF 55-60%, mild MR, PASP 34mmHg.  Marland Kitchen Chronic back pain    stenosis  . Constipation    takes Colace daily  . Depression    takes Zoloft daily  . Diverticulosis of colon   . GERD (gastroesophageal reflux disease)    atypical chest pain  . Gout    hx of;was on Allopurinol but taken off 6 wks ago by medical MD  . History of diverticulitis of colon   . HLD (hyperlipidemia)  takes Pravastatin daily  . HTN (hypertension)    takes Amlodipine and carvedilol  . OA (osteoarthritis)    right knee  . Osteopenia    takes Calcium and Vit D daily  . Vertigo    takes Antivert daily as needed    Past Surgical History:  Procedure Laterality Date  . APPENDECTOMY  at age 45  . BACK SURGERY    . CATARACT EXTRACTION Right 04/2006  . CHOLECYSTECTOMY    . COLONOSCOPY    . KNEE SURGERY Right    Medial meniscus tear  . Lumbar one-two Bilateral laminectomy and foraminotomy, Left Lumbar one-two microdiscectomy  Bilateral 11/13/2014   Performed by Hal Neer, Olga Coaster, MD at [facility]  . NECK SURGERY     x 2;fusion  . PARTIAL COLECTOMY  09/1999   d/t diverticulitis  . TUBAL LIGATION      Family History  Problem Relation Age of Onset  . Cancer Father        throat and bladder  . Diabetes Father   . Hypertension Father   . Heart attack Father   . Stroke Mother   .  Alzheimer's disease Mother   . Hypertension Mother     Social History   Socioeconomic History  . Marital status: Married    Spouse name: Not on file  . Number of children: Not on file  . Years of education: Not on file  . Highest education level: Not on file  Social Needs  . Financial resource strain: Not on file  . Food insecurity - worry: Not on file  . Food insecurity - inability: Not on file  . Transportation needs - medical: Not on file  . Transportation needs - non-medical: Not on file  Occupational History  . Occupation: Retired  Tobacco Use  . Smoking status: Never Smoker  . Smokeless tobacco: Never Used  Substance and Sexual Activity  . Alcohol use: No    Alcohol/week: 0.0 oz  . Drug use: No  . Sexual activity: No    Birth control/protection: Post-menopausal  Other Topics Concern  . Not on file  Social History Narrative  . Not on file   Review of Systems  No recent symptoms in bed Appetite is okay     Objective:   Physical Exam  Constitutional: She appears well-nourished. No distress.  Neck: No thyromegaly present.  Cardiovascular: Normal rate, regular rhythm and normal heart sounds. Exam reveals no gallop.  No murmur heard. Pulmonary/Chest: Effort normal and breath sounds normal. No respiratory distress. She has no wheezes. She has no rales.  Substernal tenderness  Abdominal: Soft. She exhibits no distension. There is no tenderness. There is no rebound and no guarding.  Musculoskeletal: She exhibits no edema.  Lymphadenopathy:    She has no cervical adenopathy.          Assessment & Plan:

## 2017-08-16 NOTE — Patient Instructions (Signed)
Please take the omprazole prescription on an empty stomach at bedtime nightly for the next 2 weeks. If your symptoms are gone, continue the hydralazine but only take the omeprazole if your pain starts up again. If your pain is not gone after 2 weeks, then try stopping the hydralazine.

## 2017-08-16 NOTE — Assessment & Plan Note (Signed)
History really suggests GERD source She is concerned about the low dose of hydralazine (1/2 tab daily) ---discussed this is unlikely, but also so low not sure it is making a difference for BP Will try omeprazole for 2 weeks---if pain continues, will stop the hydralazine then

## 2017-09-11 ENCOUNTER — Encounter: Payer: Self-pay | Admitting: Family Medicine

## 2017-09-11 ENCOUNTER — Encounter: Payer: Self-pay | Admitting: Internal Medicine

## 2017-09-13 MED ORDER — OMEPRAZOLE 20 MG PO CPDR: 20.0000 mg | DELAYED_RELEASE_CAPSULE | Freq: Every day | ORAL | 3 refills | Status: DC

## 2017-09-13 NOTE — Telephone Encounter (Signed)
rx sent to mail order pharmacy as requested

## 2017-09-13 NOTE — Telephone Encounter (Signed)
As long as the pain is better, okay to fill #[phone] x 3 Please call her about this

## 2017-09-17 MED ORDER — HYDRALAZINE HCL 10 MG PO TABS: 10.0000 mg | ORAL_TABLET | Freq: Three times a day (TID) | ORAL | 5 refills | Status: DC

## 2017-09-22 ENCOUNTER — Encounter: Payer: Self-pay | Admitting: Family Medicine

## 2017-09-23 ENCOUNTER — Telehealth: Payer: Self-pay | Admitting: *Deleted

## 2017-09-23 MED ORDER — SERTRALINE HCL 100 MG PO TABS: 100.0000 mg | ORAL_TABLET | Freq: Every day | ORAL | 2 refills | Status: DC

## 2017-09-23 MED ORDER — HYDRALAZINE HCL 10 MG PO TABS: 10.0000 mg | ORAL_TABLET | Freq: Every day | ORAL | 5 refills | Status: DC

## 2017-09-23 NOTE — Telephone Encounter (Signed)
Pt needs Rx sent to mail order pharmacy

## 2017-09-23 NOTE — Telephone Encounter (Signed)
New px sent with correct inst

## 2017-11-19 ENCOUNTER — Encounter: Payer: Self-pay | Admitting: Family Medicine

## 2017-11-19 MED ORDER — HYDRALAZINE HCL 10 MG PO TABS: 10.0000 mg | ORAL_TABLET | Freq: Every day | ORAL | 1 refills | Status: DC

## 2017-12-21 ENCOUNTER — Ambulatory Visit (INDEPENDENT_AMBULATORY_CARE_PROVIDER_SITE_OTHER): Payer: Medicare Other | Admitting: Family Medicine

## 2017-12-21 ENCOUNTER — Encounter: Payer: Self-pay | Admitting: Family Medicine

## 2017-12-21 VITALS — BP 124/68 | HR 60 | Temp 98.3°F | Ht 62.0 in | Wt 148.0 lb

## 2017-12-21 DIAGNOSIS — K219 Gastro-esophageal reflux disease without esophagitis: Secondary | ICD-10-CM

## 2017-12-21 DIAGNOSIS — F418 Other specified anxiety disorders: Secondary | ICD-10-CM | POA: Diagnosis not present

## 2017-12-21 DIAGNOSIS — I700 Atherosclerosis of aorta: Secondary | ICD-10-CM | POA: Diagnosis not present

## 2017-12-21 DIAGNOSIS — I10 Essential (primary) hypertension: Secondary | ICD-10-CM | POA: Diagnosis not present

## 2017-12-21 MED ORDER — RANITIDINE HCL 150 MG PO CAPS: 150.0000 mg | ORAL_CAPSULE | Freq: Every evening | ORAL | 11 refills | Status: DC

## 2017-12-21 NOTE — Assessment & Plan Note (Addendum)
More stress reaction and excessive worry lately- likely making gerd worse  Disc self care  Declines counseling or any other tx  Reviewed stressors/ coping techniques/symptoms/ support sources/ tx options and side effects in detail today Enc exercise as tolerated/socialization/meditation

## 2017-12-21 NOTE — Progress Notes (Signed)
Subjective:    Patient ID: Linda Robinson, female    DOB: 07/15/1929, 84 y.o.   MRN: [id number]  HPI Here for f/u of chronic health problems as well as acid reflux symptoms   GERD symptoms are worse lately (in the past since December)  Taking omeprazole 20 mg --stopped for a month and then had to re start it  Does not work very well (she takes it at night)  tums work better than anything (making her constipated as well)  A lot of burping and belching  No n/v (one episode of nausea that resolved)  Symptoms tend to start before lunch   C/o of burning sensation  ? If occ regurgitation  It makes her feel "weak"   Cereal and coffee for am  Stress makes symptoms worse  Usually eats veg /occ meat for her lunch   Not often tomato sauce  No spicy food  No nsaids at all except 81 mg asa daily   Stress level is not good (family problems- her brother passed away)  Not going to get any better  She declines tx for anx or counseling    Wt Readings from Last 3 Encounters:  12/21/17 148 lb (67.1 kg)  08/16/17 149 lb (67.6 kg)  07/02/17 149 lb 12 oz (67.9 kg)   27.07 kg/m   bp is stable today (in setting of known atherosclerosis of aorta) 1/2 hydralazine works well  No cp or palpitations or headaches or edema  No side effects to medicines  BP Readings from Last 3 Encounters:  12/21/17 124/68  08/16/17 138/80  07/02/17 128/68     H/o renal insuff Lab Results  Component Value Date   CREATININE 1.01 06/02/2017   BUN 16 06/02/2017   NA 138 06/02/2017   K 5.2 (H) 06/02/2017   CL 99 06/02/2017   CO2 32 06/02/2017  not taking potassium   Lab Results  Component Value Date   ALT 9 05/11/2017   AST 18 05/11/2017   ALKPHOS 52 05/11/2017   BILITOT 0.6 05/11/2017    Hyperglycemia Lab Results  Component Value Date   HGBA1C 5.9 05/11/2017    Patient Active Problem List   Diagnosis Date Noted  . Substernal chest pain 08/16/2017  . Costochondritis 03/09/2017  .  Atherosclerosis of aorta ([place]) 03/09/2017  . Weight loss 02/23/2017  . Routine general medical examination at a health care facility 07/05/2015  . Estrogen deficiency 07/05/2015  . Hyperglycemia 01/01/2015  . Lumbar spinal stenosis 11/13/2014  . Vertigo 10/05/2014  . Risk for falls 06/27/2014  . Low back pain on right side with sciatica 07/24/2013  . Edema 04/26/2013  . Encounter for Medicare annual wellness exam 04/11/2013  . GERD (gastroesophageal reflux disease)   . Bradycardia 05/19/2010  . Disorder of bone and cartilage 01/30/2009  . CONSTIPATION 11/01/2007  . Depression with anxiety 02/14/2007  . Hyperlipidemia LDL goal <130 02/04/2007  . Gout 02/04/2007  . Essential hypertension 02/04/2007  . DIVERTICULOSIS, COLON 02/04/2007  . Renal insufficiency 02/04/2007  . OSTEOARTHRITIS 02/04/2007  . [place] DISEASE 02/04/2007   Past Medical History:  Diagnosis Date  . Basal cell carcinoma    removed  . Chest pain    a. 02/2017 MV: EF 54%, small, distal anterior wall/apical infacrt (likely misregistration), no ischemia-->Low risk; b. 02/2017 Echo: EF 55-60%, mild MR, PASP 54mmHg.  Marland Kitchen Chronic back pain    stenosis  . Constipation    takes Colace daily  . Depression  takes Zoloft daily  . Diverticulosis of colon   . GERD (gastroesophageal reflux disease)    atypical chest pain  . Gout    hx of;was on Allopurinol but taken off 6 wks ago by medical MD  . History of diverticulitis of colon   . HLD (hyperlipidemia)    takes Pravastatin daily  . HTN (hypertension)    takes Amlodipine and carvedilol  . OA (osteoarthritis)    right knee  . Osteopenia    takes Calcium and Vit D daily  . Vertigo    takes Antivert daily as needed   Past Surgical History:  Procedure Laterality Date  . APPENDECTOMY  at age 60  . BACK SURGERY    . CATARACT EXTRACTION Right 04/2006  . CHOLECYSTECTOMY    . COLONOSCOPY    . KNEE SURGERY Right    Medial meniscus tear  . LUMBAR  LAMINECTOMY/DECOMPRESSION MICRODISCECTOMY Bilateral 11/13/2014   Procedure: Lumbar one-two Bilateral laminectomy and foraminotomy, Left Lumbar one-two microdiscectomy ;  Surgeon: Faythe Ghee, MD;  Location: [facility];  Service: Neurosurgery;  Laterality: Bilateral;  . NECK SURGERY     x 2;fusion  . PARTIAL COLECTOMY  09/1999   d/t diverticulitis  . TUBAL LIGATION     Social History   Tobacco Use  . Smoking status: Never Smoker  . Smokeless tobacco: Never Used  Substance Use Topics  . Alcohol use: No    Alcohol/week: 0.0 oz  . Drug use: No   Family History  Problem Relation Age of Onset  . Cancer Father        throat and bladder  . Diabetes Father   . Hypertension Father   . Heart attack Father   . Stroke Mother   . Alzheimer's disease Mother   . Hypertension Mother    Allergies  Allergen Reactions  . Ace Inhibitors     REACTION: increased K+, increased creat.  . Cephalosporins     REACTION: reaction not known  . Colesevelam     REACTION: constipation  . Hctz [Hydrochlorothiazide]     Makes her feel "bad"  . Losartan     Increased K  . Lovastatin     REACTION: doesn't work  . Raloxifene     REACTION: cramps  . Rosuvastatin     REACTION: myalgia  . Simvastatin     REACTION: myalgia   Current Outpatient Medications on File Prior to Visit  Medication Sig Dispense Refill  . amLODipine (NORVASC) 10 MG tablet Take 1 tablet (10 mg total) daily by mouth. 90 tablet 2  . aspirin EC 81 MG tablet Take 1 tablet (81 mg total) by mouth daily. 90 tablet 3  . Calcium Carbonate-Vitamin D (CALCIUM 500 + D) 500-125 MG-UNIT TABS Take by mouth.    . Cranberry 250 MG CAPS Take 1 capsule by mouth daily.      Marland Kitchen docusate sodium (COLACE) 100 MG capsule Take 100-200 mg by mouth 2 (two) times daily. 100 mg in the morning and 200 mg at night    . fish oil-omega-3 fatty acids 1000 MG capsule Take 1 g by mouth at bedtime.     . furosemide (LASIX) 20 MG tablet TAKE 1 TABLET DAILY AS  NEEDED FOR SWELLING 90 tablet 2  . hydrALAZINE (APRESOLINE) 10 MG tablet Take 1 tablet (10 mg total) by mouth daily. 90 tablet 1  . meclizine (ANTIVERT) 25 MG tablet Take 25 mg by mouth 4 (four) times daily as needed for dizziness.     Marland Kitchen  omeprazole (PRILOSEC) 20 MG capsule Take 1 capsule (20 mg total) by mouth daily. 90 capsule 3  . pravastatin (PRAVACHOL) 20 MG tablet TAKE 1 TABLET DAILY 90 tablet 2  . sertraline (ZOLOFT) 100 MG tablet Take 1 tablet (100 mg total) by mouth daily. 90 tablet 2  . Tetrahydrozoline HCl (EYE DROPS OP) Apply 1-2 drops to eye as needed (for dry eye).     No current facility-administered medications on file prior to visit.     Review of Systems  Constitutional: Negative for activity change, appetite change, fatigue, fever and unexpected weight change.  HENT: Negative for congestion, ear pain, rhinorrhea, sinus pressure and sore throat.   Eyes: Negative for pain, redness and visual disturbance.  Respiratory: Negative for cough, shortness of breath and wheezing.   Cardiovascular: Negative for chest pain and palpitations.  Gastrointestinal: Positive for nausea. Negative for abdominal distention, abdominal pain, anal bleeding, blood in stool, constipation, diarrhea and rectal pain.       Heartburn and epigastric burning   Endocrine: Negative for polydipsia and polyuria.  Genitourinary: Negative for dysuria, frequency and urgency.  Musculoskeletal: Negative for arthralgias, back pain and myalgias.  Skin: Negative for pallor and rash.  Allergic/Immunologic: Negative for environmental allergies.  Neurological: Negative for dizziness, syncope and headaches.  Hematological: Negative for adenopathy. Does not bruise/bleed easily.  Psychiatric/Behavioral: Negative for decreased concentration and dysphoric mood. The patient is nervous/anxious.        Stressors  worry       Objective:   Physical Exam  Constitutional: She appears well-developed and well-nourished. No  distress.  overwt and well app  HENT:  Head: Normocephalic and atraumatic.  Mouth/Throat: Oropharynx is clear and moist.  Eyes: Pupils are equal, round, and reactive to light. Conjunctivae and EOM are normal. No scleral icterus.  Neck: Normal range of motion. Neck supple.  Cardiovascular: Normal rate, regular rhythm and normal heart sounds.  Pulmonary/Chest: Effort normal and breath sounds normal. No respiratory distress. She has no wheezes. She has no rales.  Abdominal: Soft. Bowel sounds are normal. She exhibits no distension and no mass. There is tenderness in the epigastric area. There is no rebound, no guarding, no CVA tenderness, no tenderness at McBurney's point and negative Murphy's sign.  Lymphadenopathy:    She has no cervical adenopathy.  Neurological: She is alert.  Skin: Skin is warm and dry. No erythema. No pallor.  No jaundice or pallor   Psychiatric: Her speech is normal and behavior is normal. Thought content normal. Her mood appears anxious. Her affect is not blunt, not labile and not inappropriate. She does not exhibit a depressed mood.  Worried/anxious  Not tearful           Assessment & Plan:   Problem List Items Addressed This Visit      Cardiovascular and Mediastinum   Atherosclerosis of aorta (HCC)    No clinical changes Continue to follow      Essential hypertension (Chronic)    bp in fair control at this time  BP Readings from Last 1 Encounters:  12/21/17 124/68   No changes needed Disc lifstyle change with low sodium diet and exercise          Digestive   GERD (gastroesophageal reflux disease) - Primary    Worse lately  Mild imp with omeprazole- will change to am dosing before food/meds Add ranitidine 150 mg in pm  Diet handout Wt control  Stress control - worry and anxiety are contributing  Relevant Medications   ranitidine (ZANTAC) 150 MG capsule     Genitourinary   Renal insufficiency     Other   Depression with anxiety      More stress reaction and excessive worry lately- likely making gerd worse  Disc self care  Declines counseling or any other tx  Reviewed stressors/ coping techniques/symptoms/ support sources/ tx options and side effects in detail today Enc exercise as tolerated/socialization/meditation

## 2017-12-21 NOTE — Patient Instructions (Addendum)
Watch diet/ see handout  Change your omeprazole to am dosing- 30-60 minutes before eating or taking other medicines  Add ranitidine (zantac) at night   tums are ok as needed  Drink lots of water Avoid caffeine and acidic beverages (like lemonade and orange juice)   If acid reflux is not much improved in 2 weeks please call and let us know   Do whatever you can to help worry and anxiety  Get outdoors  Exercise as tolerated  Talk on the phone and socialize when you can  Pick up on hobbies/ crafts Keep reading  Meditation helps also   If you become interested in counseling- call and let me know - it would be helpful

## 2017-12-21 NOTE — Assessment & Plan Note (Signed)
Worse lately  Mild imp with omeprazole- will change to am dosing before food/meds Add ranitidine 150 mg in pm  Diet handout Wt control  Stress control - worry and anxiety are contributing

## 2017-12-21 NOTE — Assessment & Plan Note (Signed)
No clinical changes Continue to follow

## 2017-12-21 NOTE — Assessment & Plan Note (Signed)
bp in fair control at this time  BP Readings from Last 1 Encounters:  12/21/17 124/68   No changes needed Disc lifstyle change with low sodium diet and exercise

## 2017-12-28 ENCOUNTER — Telehealth: Payer: Self-pay

## 2017-12-28 NOTE — Telephone Encounter (Signed)
Received a fax from [facility] for ranitidine. Spoke to pt. She will call us back in a few weeks if the medication is working so that it can go to mail order.

## 2018-01-01 ENCOUNTER — Encounter: Payer: Self-pay | Admitting: Family Medicine

## 2018-01-03 MED ORDER — RANITIDINE HCL 150 MG PO CAPS: 150.0000 mg | ORAL_CAPSULE | Freq: Every evening | ORAL | 3 refills | Status: DC

## 2018-01-03 NOTE — Telephone Encounter (Signed)
Refill ranitidine

## 2018-01-04 ENCOUNTER — Ambulatory Visit: Payer: Medicare Other | Admitting: Family Medicine

## 2018-03-09 ENCOUNTER — Other Ambulatory Visit: Payer: Self-pay | Admitting: Family Medicine

## 2018-04-25 ENCOUNTER — Other Ambulatory Visit: Payer: Self-pay | Admitting: Family Medicine

## 2018-05-09 ENCOUNTER — Other Ambulatory Visit: Payer: Self-pay | Admitting: Family Medicine

## 2018-05-16 ENCOUNTER — Ambulatory Visit: Payer: Medicare Other

## 2018-05-16 ENCOUNTER — Ambulatory Visit (INDEPENDENT_AMBULATORY_CARE_PROVIDER_SITE_OTHER): Payer: Medicare Other

## 2018-05-16 VITALS — BP 130/60 | HR 60 | Temp 98.0°F | Ht 62.5 in | Wt 147.5 lb

## 2018-05-16 DIAGNOSIS — R739 Hyperglycemia, unspecified: Secondary | ICD-10-CM

## 2018-05-16 DIAGNOSIS — Z0000 Encounter for general adult medical examination without abnormal findings: Secondary | ICD-10-CM | POA: Diagnosis not present

## 2018-05-16 DIAGNOSIS — E785 Hyperlipidemia, unspecified: Secondary | ICD-10-CM | POA: Diagnosis not present

## 2018-05-16 DIAGNOSIS — I10 Essential (primary) hypertension: Secondary | ICD-10-CM

## 2018-05-16 LAB — COMPREHENSIVE METABOLIC PANEL
ALT: 9 U/L (ref 0–35)
AST: 17 U/L (ref 0–37)
Albumin: 4.4 g/dL (ref 3.5–5.2)
Alkaline Phosphatase: 53 U/L (ref 39–117)
BUN: 21 mg/dL (ref 6–23)
CHLORIDE: 102 meq/L (ref 96–112)
CO2: 32 meq/L (ref 19–32)
CREATININE: 1.2 mg/dL (ref 0.40–1.20)
Calcium: 10 mg/dL (ref 8.4–10.5)
GFR: 45.14 mL/min — ABNORMAL LOW (ref >60.00)
Glucose, Bld: 92 mg/dL (ref 70–99)
Potassium: 5 mEq/L (ref 3.5–5.1)
SODIUM: 140 meq/L (ref 135–145)
Total Bilirubin: 0.7 mg/dL (ref 0.2–1.2)
Total Protein: 7 g/dL (ref 6.0–8.3)

## 2018-05-16 LAB — LIPID PANEL
CHOL/HDL RATIO: 3
Cholesterol: 201 mg/dL — ABNORMAL HIGH (ref 0–200)
HDL: 67.1 mg/dL (ref >39.00)
LDL Cholesterol: 109 mg/dL — ABNORMAL HIGH (ref 0–99)
NONHDL: 133.9
Triglycerides: 124 mg/dL (ref 0.0–149.0)
VLDL: 24.8 mg/dL (ref 0.0–40.0)

## 2018-05-16 LAB — CBC WITH DIFFERENTIAL/PLATELET
BASOS PCT: 0.5 % (ref 0.0–3.0)
Basophils Absolute: 0.1 10*3/uL (ref 0.0–0.1)
EOS ABS: 0.1 10*3/uL (ref 0.0–0.7)
Eosinophils Relative: 1.1 % (ref 0.0–5.0)
HEMATOCRIT: 41.6 % (ref 36.0–46.0)
Hemoglobin: 13.8 g/dL (ref 12.0–15.0)
Lymphocytes Relative: 42.7 % (ref 12.0–46.0)
Lymphs Abs: 4.4 10*3/uL — ABNORMAL HIGH (ref 0.7–4.0)
MCHC: 33.1 g/dL (ref 30.0–36.0)
MCV: 88.4 fl (ref 78.0–100.0)
MONO ABS: 0.6 10*3/uL (ref 0.1–1.0)
Monocytes Relative: 6.2 % (ref 3.0–12.0)
NEUTROS ABS: 5.1 10*3/uL (ref 1.4–7.7)
NEUTROS PCT: 49.5 % (ref 43.0–77.0)
Platelets: 279 10*3/uL (ref 150.0–400.0)
RBC: 4.71 Mil/uL (ref 3.87–5.11)
RDW: 13.7 % (ref 11.5–15.5)
WBC: 10.2 10*3/uL (ref 4.0–10.5)

## 2018-05-16 LAB — HEMOGLOBIN A1C: Hgb A1c MFr Bld: 5.7 % (ref 4.6–6.5)

## 2018-05-16 LAB — TSH: TSH: 2.16 u[IU]/mL (ref 0.35–4.50)

## 2018-05-16 NOTE — Progress Notes (Signed)
PCP notes:   Health maintenance:  Mammogram - addressed Flu vaccine - addressed  Abnormal screenings:   Hearing - failed  Hearing Screening   125Hz  250Hz  500Hz  1000Hz  2000Hz  3000Hz  4000Hz  6000Hz  8000Hz   Right ear:   40 40 0  0    Left ear:   40 40 0  0     Fall risk - hx of multiple falls Fall Risk  05/16/2018 05/11/2017 07/07/2016 07/05/2015 06/27/2014  Falls in the past year? Yes Yes No Yes Yes  Comment 1 fall in home after tripping over cord, 2 fall in garden after tripping over vines pt fell on back porch; injury to right eye - - -  Number falls in past yr: 2 or more 1 - 1 1  Injury with Fall? No Yes - Yes No  Risk for fall due to : Impaired balance/gait;Impaired mobility;History of fall(s) - - - -    Patient concerns:   None  Nurse concerns:  None  Next PCP appt:   05/23/18 @ 1430  I reviewed health advisor's note, was available for consultation, and agree with documentation and plan. Loura Pardon MD

## 2018-05-16 NOTE — Progress Notes (Signed)
Subjective:   Linda Robinson is a 84 y.o. female who presents for Medicare Annual (Subsequent) preventive examination.  Review of Systems:  N/A Cardiac Risk Factors include: advanced age (>8men, >86 women);dyslipidemia;hypertension     Objective:     Vitals: BP 130/60 (BP Location: Right Arm, Patient Position: Sitting, Cuff Size: Normal)   Pulse 60   Temp 98 F (36.7 C) (Oral)   Ht 5' 2.5" (1.588 m) Comment: shoes  Wt 147 lb 8 oz (66.9 kg)   SpO2 98%   BMI 26.55 kg/m   Body mass index is 26.55 kg/m.  Advanced Directives 05/16/2018 05/11/2017 03/06/2017 03/05/2017 07/07/2016 11/15/2014 11/09/2014  Does Patient Have a Medical Advance Directive? Yes Yes Yes Yes No No No  Type of Advance Directive Healthcare Power of [place] - -  Does patient want to make changes to medical advance directive? - - No - Patient declined No - Patient declined Yes - information given - -  Copy of [place] in Chart? No - copy requested No - copy requested No - copy requested - No - copy requested - -  Would patient like information on creating a medical advance directive? - - - - Yes - Educational materials given No - patient declined information No - patient declined information    Tobacco Social History   Tobacco Use  Smoking Status Never Smoker  Smokeless Tobacco Never Used     Counseling given: No   Clinical Intake:  Pre-visit preparation completed: Yes  Pain : No/denies pain Pain Score: 0-No pain     Nutritional Status: BMI 25 -29 Overweight Nutritional Risks: None Diabetes: No  How often do you need to have someone help you when you read instructions, pamphlets, or other written materials from your doctor or pharmacy?: 1 - Never What is the last grade level you completed in school?: Associates degree  Interpreter Needed?: No  Comments: pt lives with spouse Information  entered by :: LPinson, LPN  Past Medical History:  Diagnosis Date  . Basal cell carcinoma    removed  . Chest pain    a. 02/2017 MV: EF 54%, small, distal anterior wall/apical infacrt (likely misregistration), no ischemia-->Low risk; b. 02/2017 Echo: EF 55-60%, mild MR, PASP 52mmHg.  Marland Kitchen Chronic back pain    stenosis  . Constipation    takes Colace daily  . Depression    takes Zoloft daily  . Diverticulosis of colon   . GERD (gastroesophageal reflux disease)    atypical chest pain  . Gout    hx of;was on Allopurinol but taken off 6 wks ago by medical MD  . History of diverticulitis of colon   . HLD (hyperlipidemia)    takes Pravastatin daily  . HTN (hypertension)    takes Amlodipine and carvedilol  . OA (osteoarthritis)    right knee  . Osteopenia    takes Calcium and Vit D daily  . Vertigo    takes Antivert daily as needed   Past Surgical History:  Procedure Laterality Date  . APPENDECTOMY  at age 30  . BACK SURGERY    . CATARACT EXTRACTION Right 04/2006  . CHOLECYSTECTOMY    . COLONOSCOPY    . KNEE SURGERY Right    Medial meniscus tear  . LUMBAR LAMINECTOMY/DECOMPRESSION MICRODISCECTOMY Bilateral 11/13/2014   Procedure: Lumbar one-two Bilateral laminectomy and foraminotomy, Left Lumbar one-two microdiscectomy ;  Surgeon: Olga Coaster  Kritzer, MD;  Location: [facility];  Service: Neurosurgery;  Laterality: Bilateral;  . NECK SURGERY     x 2;fusion  . PARTIAL COLECTOMY  09/1999   d/t diverticulitis  . TUBAL LIGATION     Family History  Problem Relation Age of Onset  . Cancer Father        throat and bladder  . Diabetes Father   . Hypertension Father   . Heart attack Father   . Stroke Mother   . Alzheimer's disease Mother   . Hypertension Mother    Social History   Socioeconomic History  . Marital status: Married    Spouse name: Not on file  . Number of children: Not on file  . Years of education: Not on file  . Highest education level: Not on file    Occupational History  . Occupation: Retired  Scientific laboratory technician  . Financial resource strain: Not on file  . Food insecurity:    Worry: Not on file    Inability: Not on file  . Transportation needs:    Medical: Not on file    Non-medical: Not on file  Tobacco Use  . Smoking status: Never Smoker  . Smokeless tobacco: Never Used  Substance and Sexual Activity  . Alcohol use: No    Alcohol/week: 0.0 standard drinks  . Drug use: No  . Sexual activity: Never    Birth control/protection: Post-menopausal  Lifestyle  . Physical activity:    Days per week: Not on file    Minutes per session: Not on file  . Stress: Not on file  Relationships  . Social connections:    Talks on phone: Not on file    Gets together: Not on file    Attends religious service: Not on file    Active member of club or organization: Not on file    Attends meetings of clubs or organizations: Not on file    Relationship status: Not on file  Other Topics Concern  . Not on file  Social History Narrative  . Not on file    Outpatient Encounter Medications as of 05/16/2018  Medication Sig  . amLODipine (NORVASC) 10 MG tablet TAKE 1 TABLET DAILY  . aspirin EC 81 MG tablet Take 1 tablet (81 mg total) by mouth daily.  . Calcium Carbonate-Vitamin D (CALCIUM 500 + D) 500-125 MG-UNIT TABS Take by mouth.  . Cranberry 250 MG CAPS Take 1 capsule by mouth daily.    Marland Kitchen docusate sodium (COLACE) 100 MG capsule Take 100-200 mg by mouth 2 (two) times daily. 100 mg in the morning and 200 mg at night  . fish oil-omega-3 fatty acids 1000 MG capsule Take 1 g by mouth at bedtime.   . furosemide (LASIX) 20 MG tablet TAKE 1 TABLET DAILY AS NEEDED FOR SWELLING  . hydrALAZINE (APRESOLINE) 10 MG tablet TAKE 1 TABLET DAILY  . meclizine (ANTIVERT) 25 MG tablet Take 25 mg by mouth 4 (four) times daily as needed for dizziness.   Marland Kitchen omeprazole (PRILOSEC) 20 MG capsule Take 1 capsule (20 mg total) by mouth daily.  . pravastatin (PRAVACHOL) 20 MG  tablet TAKE 1 TABLET DAILY  . ranitidine (ZANTAC) 150 MG capsule Take 1 capsule (150 mg total) by mouth every evening.  . sertraline (ZOLOFT) 100 MG tablet Take 1 tablet (100 mg total) by mouth daily.  . Tetrahydrozoline HCl (EYE DROPS OP) Apply 1-2 drops to eye as needed (for dry eye).   No facility-administered encounter medications on  file as of 05/16/2018.     Activities of Daily Living In your present state of health, do you have any difficulty performing the following activities: 05/16/2018  Hearing? Y  Vision? Y  Difficulty concentrating or making decisions? N  Walking or climbing stairs? N  Dressing or bathing? N  Doing errands, shopping? N  Comment short distances only   Conservation officer, nature and eating ? N  Using the Toilet? N  In the past six months, have you accidently leaked urine? N  Do you have problems with loss of bowel control? N  Managing your Medications? N  Managing your Finances? N  Housekeeping or managing your Housekeeping? N  Some recent data might be hidden    Patient Care Team: Tower, Wynelle Fanny, MD as PCP - General Starling Manns, MD as Consulting Physician (Orthopedic Surgery)    Assessment:   This is a routine wellness examination for Linda Robinson.   Hearing Screening   125Hz  250Hz  500Hz  1000Hz  2000Hz  3000Hz  4000Hz  6000Hz  8000Hz   Right ear:   40 40 0  0    Left ear:   40 40 0  0    Vision Screening Comments: Vision exam in Jan 2019    Exercise Activities and Dietary recommendations Current Exercise Habits: The patient does not participate in regular exercise at present, Exercise limited by: None identified  Goals    . Increase water intake     Starting 05/16/2018, I will continue to drink at least 6-8 glasses of water daily.        Fall Risk Fall Risk  05/16/2018 05/11/2017 07/07/2016 07/05/2015 06/27/2014  Falls in the past year? Yes Yes No Yes Yes  Comment 1 fall in home after tripping over cord, 2 fall in garden after tripping over vines pt fell on back  porch; injury to right eye - - -  Number falls in past yr: 2 or more 1 - 1 1  Injury with Fall? No Yes - Yes No  Risk for fall due to : Impaired balance/gait;Impaired mobility;History of fall(s) - - - -    Depression Screen PHQ 2/9 Scores 05/16/2018 05/11/2017 07/07/2016 07/05/2015  PHQ - 2 Score 0 0 0 0  PHQ- 9 Score 0 3 - -     Cognitive Function MMSE - Mini Mental State Exam 05/16/2018 05/11/2017 07/07/2016  Orientation to time 5 5 5   Orientation to Place 5 5 5   Registration 3 3 3   Attention/ Calculation 0 0 0  Recall 3 3 3   Language- name 2 objects 0 0 0  Language- repeat 1 1 1   Language- follow 3 step command 3 3 3   Language- read & follow direction 0 0 0  Write a sentence 0 0 0  Copy design 0 0 0  Total score 20 20 20      PLEASE NOTE: A Mini-Cog screen was completed. Maximum score is 20. A value of 0 denotes this part of [place] MMSE was not completed or the patient failed this part of the Mini-Cog screening.   Mini-Cog Screening Orientation to Time - Max 5 pts Orientation to Place - Max 5 pts Registration - Max 3 pts Recall - Max 3 pts Language Repeat - Max 1 pts Language Follow 3 Step Command - Max 3 pts     Immunization History  Administered Date(s) Administered  . Influenza Split 07/23/2011, 07/01/2017  . Influenza Whole 08/11/2004, 07/08/2007, 07/09/2008, 06/19/2009, 07/08/2010  . Influenza, High Dose Seasonal PF 07/31/2015, 06/30/2016, 07/01/2017  .  Influenza-Unspecified 06/28/2013, 08/05/2015  . PPD Test 10/03/2015  . Pneumococcal Conjugate-13 06/27/2014  . Pneumococcal Polysaccharide-23 02/10/2012  . Td 08/28/1997, 02/10/2012   Screening Tests Health Maintenance  Topic Date Due  . MAMMOGRAM  09/27/2018 (Originally 08/31/2017)  . INFLUENZA VACCINE  12/28/2018 (Originally 04/28/2018)  . TETANUS/TDAP  02/09/2022  . DEXA SCAN  Completed  . PNA vac Low Risk Adult  Completed      Plan:   I have personally reviewed, addressed, and noted the following in  the patient's chart:  A. Medical and social history B. Use of alcohol, tobacco or illicit drugs  C. Current medications and supplements D. Functional ability and status E.  Nutritional status F.  Physical activity G. Advance directives H. List of other physicians I.  Hospitalizations, surgeries, and ER visits in previous 12 months J.  [place] to include hearing, vision, cognitive, depression L. Referrals and appointments - none  In addition, I have reviewed and discussed with patient certain preventive protocols, quality metrics, and best practice recommendations. A written personalized care plan for preventive services as well as general preventive health recommendations were provided to patient.  See attached scanned questionnaire for additional information.   Signed,   Lindell Noe, MHA, BS, LPN Health Coach

## 2018-05-16 NOTE — Patient Instructions (Signed)
Linda Robinson , Thank you for taking time to come for your Medicare Wellness Visit. I appreciate your ongoing commitment to your health goals. Please review the following plan we discussed and let me know if I can assist you in the future.   These are the goals we discussed: Goals    . Increase water intake     Starting 05/16/2018, I will continue to drink at least 6-8 glasses of water daily.        This is a list of the screening recommended for you and due dates:  Health Maintenance  Topic Date Due  . Mammogram  09/27/2018*  . Flu Shot  12/28/2018*  . Tetanus Vaccine  02/09/2022  . DEXA scan (bone density measurement)  Completed  . Pneumonia vaccines  Completed  *Topic was postponed. The date shown is not the original due date.   Preventive Care for Adults  A healthy lifestyle and preventive care can promote health and wellness. Preventive health guidelines for adults include the following key practices.  . A routine yearly physical is a good way to check with your health care provider about your health and preventive screening. It is a chance to share any concerns and updates on your health and to receive a thorough exam.  . Visit your dentist for a routine exam and preventive care every 6 months. Brush your teeth twice a day and floss once a day. Good oral hygiene prevents tooth decay and gum disease.  . The frequency of eye exams is based on your age, health, family medical history, use  of contact lenses, and other factors. Follow your health care provider's recommendations for frequency of eye exams.  . Eat a healthy diet. Foods like vegetables, fruits, whole grains, low-fat dairy products, and lean protein foods contain the nutrients you need without too many calories. Decrease your intake of foods high in solid fats, added sugars, and salt. Eat the right amount of calories for you. Get information about a proper diet from your health care provider, if necessary.  . Regular  physical exercise is one of the most important things you can do for your health. Most adults should get at least 150 minutes of moderate-intensity exercise (any activity that increases your heart rate and causes you to sweat) each week. In addition, most adults need muscle-strengthening exercises on 2 or more days a week.  Silver Sneakers may be a benefit available to you. To determine eligibility, you may visit the website: www.silversneakers.com or contact program at 571-422-9968 Mon-Fri between 8AM-8PM.   . Maintain a healthy weight. The body mass index (BMI) is a screening tool to identify possible weight problems. It provides an estimate of body fat based on height and weight. Your health care provider can find your BMI and can help you achieve or maintain a healthy weight.   For adults 20 years and older: ? A BMI below 18.5 is considered underweight. ? A BMI of 18.5 to 24.9 is normal. ? A BMI of 25 to 29.9 is considered overweight. ? A BMI of 30 and above is considered obese.   . Maintain normal blood lipids and cholesterol levels by exercising and minimizing your intake of saturated fat. Eat a balanced diet with plenty of fruit and vegetables. Blood tests for lipids and cholesterol should begin at age 80 and be repeated every 5 years. If your lipid or cholesterol levels are high, you are over 50, or you are at high risk for heart disease, you  may need your cholesterol levels checked more frequently. Ongoing high lipid and cholesterol levels should be treated with medicines if diet and exercise are not working.  . If you smoke, find out from your health care provider how to quit. If you do not use tobacco, please do not start.  . If you choose to drink alcohol, please do not consume more than 2 drinks per day. One drink is considered to be 12 ounces (355 mL) of beer, 5 ounces (148 mL) of wine, or 1.5 ounces (44 mL) of liquor.  . If you are 43-79 years old, ask your health care provider if  you should take aspirin to prevent strokes.  . Use sunscreen. Apply sunscreen liberally and repeatedly throughout the day. You should seek shade when your shadow is shorter than you. Protect yourself by wearing long sleeves, pants, a wide-brimmed hat, and sunglasses year round, whenever you are outdoors.  . Once a month, do a whole body skin exam, using a mirror to look at the skin on your back. Tell your health care provider of new moles, moles that have irregular borders, moles that are larger than a pencil eraser, or moles that have changed in shape or color.

## 2018-05-23 ENCOUNTER — Encounter: Payer: Self-pay | Admitting: Family Medicine

## 2018-05-23 ENCOUNTER — Ambulatory Visit (INDEPENDENT_AMBULATORY_CARE_PROVIDER_SITE_OTHER): Payer: Medicare Other | Admitting: Family Medicine

## 2018-05-23 VITALS — BP 142/78 | HR 58 | Temp 98.4°F | Ht 62.5 in | Wt 147.8 lb

## 2018-05-23 DIAGNOSIS — Z0000 Encounter for general adult medical examination without abnormal findings: Secondary | ICD-10-CM

## 2018-05-23 DIAGNOSIS — Z1231 Encounter for screening mammogram for malignant neoplasm of breast: Secondary | ICD-10-CM | POA: Insufficient documentation

## 2018-05-23 DIAGNOSIS — N289 Disorder of kidney and ureter, unspecified: Secondary | ICD-10-CM

## 2018-05-23 DIAGNOSIS — I10 Essential (primary) hypertension: Secondary | ICD-10-CM | POA: Diagnosis not present

## 2018-05-23 DIAGNOSIS — R739 Hyperglycemia, unspecified: Secondary | ICD-10-CM

## 2018-05-23 DIAGNOSIS — M899 Disorder of bone, unspecified: Secondary | ICD-10-CM | POA: Diagnosis not present

## 2018-05-23 DIAGNOSIS — I700 Atherosclerosis of aorta: Secondary | ICD-10-CM

## 2018-05-23 DIAGNOSIS — Z9181 History of falling: Secondary | ICD-10-CM

## 2018-05-23 DIAGNOSIS — E785 Hyperlipidemia, unspecified: Secondary | ICD-10-CM | POA: Diagnosis not present

## 2018-05-23 DIAGNOSIS — M949 Disorder of cartilage, unspecified: Secondary | ICD-10-CM

## 2018-05-23 MED ORDER — OMEPRAZOLE 20 MG PO CPDR: 20.0000 mg | DELAYED_RELEASE_CAPSULE | Freq: Every day | ORAL | 3 refills | Status: DC

## 2018-05-23 MED ORDER — PRAVASTATIN SODIUM 20 MG PO TABS: 20.0000 mg | ORAL_TABLET | Freq: Every day | ORAL | 3 refills | Status: DC

## 2018-05-23 MED ORDER — FUROSEMIDE 20 MG PO TABS: ORAL_TABLET | ORAL | 3 refills | Status: DC

## 2018-05-23 MED ORDER — AMLODIPINE BESYLATE 10 MG PO TABS: 10.0000 mg | ORAL_TABLET | Freq: Every day | ORAL | 3 refills | Status: DC

## 2018-05-23 MED ORDER — RANITIDINE HCL 150 MG PO CAPS: 150.0000 mg | ORAL_CAPSULE | Freq: Every evening | ORAL | 3 refills | Status: DC

## 2018-05-23 MED ORDER — SERTRALINE HCL 100 MG PO TABS: 100.0000 mg | ORAL_TABLET | Freq: Every day | ORAL | 3 refills | Status: DC

## 2018-05-23 MED ORDER — HYDRALAZINE HCL 10 MG PO TABS: 10.0000 mg | ORAL_TABLET | Freq: Every day | ORAL | 3 refills | Status: DC

## 2018-05-23 NOTE — Assessment & Plan Note (Signed)
Labs stable to improved

## 2018-05-23 NOTE — Assessment & Plan Note (Signed)
Lab Results  Component Value Date   HGBA1C 5.7 05/16/2018  stable  disc imp of low glycemic diet and wt loss to prevent DM2

## 2018-05-23 NOTE — Assessment & Plan Note (Signed)
Pt declines further dexa or any tx  On vit D Disc fall prev No fractures Disc exercise as tolerated

## 2018-05-23 NOTE — Patient Instructions (Addendum)
I think you should use your walker instead of a cane to prevent falls It gives you a lot more support   Look into a recumbent bike (that does not require use of arms)   We will schedule your mammogram on the way out   Get your flu shot in sept or October  If you are interested in the new shingles vaccine (Shingrix) - call your local pharmacy to check on coverage and availability  If affordable- get on a waiting list at pharmacy

## 2018-05-23 NOTE — Assessment & Plan Note (Signed)
Reviewed health habits including diet and exercise and skin cancer prevention Reviewed appropriate screening tests for age  Also reviewed health mt list, fam hx and immunization status , as well as social and family history    amw reviewed  Pt declines hearing aides Disc fall risk and recommended use of walker Disc shingrix vaccine Disc flu shot- will get in the fall

## 2018-05-23 NOTE — Assessment & Plan Note (Signed)
Disc goals for lipids and reasons to control them Rev last labs with pt Rev low sat fat diet in detail Stable with pravastatin and diet   

## 2018-05-23 NOTE — Assessment & Plan Note (Signed)
Scheduled annual screening mammogram ([facility]) Nl breast exam today  Encouraged monthly self exams

## 2018-05-23 NOTE — Assessment & Plan Note (Signed)
No clinical changes  Will control HTN and cholesterol

## 2018-05-23 NOTE — Progress Notes (Signed)
Subjective:    Patient ID: Linda Robinson, female    DOB: 08-16-1929, 83 y.o.   MRN: [id number]  HPI Here for health maintenance exam and to review chronic medical problems     Wt Readings from Last 3 Encounters:  05/23/18 147 lb 12.8 oz (67 kg)  05/16/18 147 lb 8 oz (66.9 kg)  12/21/17 148 lb (67.1 kg)  trying to take care of herself  Cannot do much exercise due to knee pain  26.60 kg/m    Had amw on 8/19  Missed high tones for hearing bilat -she does not hear well  Declines hearing aides because she does not go out much  Family does not complain  Noted several falls from tripping at home and in the garden (bad balance/ also knee problems)   Mammogram 12/17 neg- none since/no longer drives to [place]/no one to take her  No lumps on self exam    Flu shot -gets in the fall   dexa 10/16 osteopenia  Has had falls No fractures  Declines another mammogram- would not take med for bone density  Takes vit D  Zoster status - never had vaccine  Interested in shingrix    bp is up a bit today (nervous when husband is working on a tractor)  No cp or palpitations or headaches or edema  No side effects to medicines  BP Readings from Last 3 Encounters:  05/23/18 (!) 142/78  05/16/18 130/60  12/21/17 124/68      Renal insufficiency Lab Results  Component Value Date   CREATININE 1.20 05/16/2018   BUN 21 05/16/2018   NA 140 05/16/2018   K 5.0 05/16/2018   CL 102 05/16/2018   CO2 32 05/16/2018   Lab Results  Component Value Date   ALT 9 05/16/2018   AST 17 05/16/2018   ALKPHOS 53 05/16/2018   BILITOT 0.7 05/16/2018    Lab Results  Component Value Date   WBC 10.2 05/16/2018   HGB 13.8 05/16/2018   HCT 41.6 05/16/2018   MCV 88.4 05/16/2018   PLT 279.0 05/16/2018     Hyperlipidemia Lab Results  Component Value Date   CHOL 201 (H) 05/16/2018   CHOL 202 (H) 05/11/2017   CHOL 231 (H) 02/23/2017   Lab Results  Component Value Date   HDL 67.10 05/16/2018     HDL 61.50 05/11/2017   HDL 65.70 02/23/2017   Lab Results  Component Value Date   LDLCALC 109 (H) 05/16/2018   LDLCALC 112 (H) 05/11/2017   LDLCALC 141 (H) 02/23/2017   Lab Results  Component Value Date   TRIG 124.0 05/16/2018   TRIG 141.0 05/11/2017   TRIG 120.0 02/23/2017   Lab Results  Component Value Date   CHOLHDL 3 05/16/2018   CHOLHDL 3 05/11/2017   CHOLHDL 4 02/23/2017   Lab Results  Component Value Date   LDLDIRECT 135.4 04/11/2013   LDLDIRECT 134.0 02/03/2012   LDLDIRECT 150.8 10/30/2010   Pravastatin and diet  Diet is good  No fried foods or red meat    Elevated glucose Lab Results  Component Value Date   HGBA1C 5.7 05/16/2018  down from 5.9   Patient Active Problem List   Diagnosis Date Noted  . Screening mammogram, encounter for 05/23/2018  . Substernal chest pain 08/16/2017  . Atherosclerosis of aorta ([place]) 03/09/2017  . Weight loss 02/23/2017  . Routine general medical examination at a health care facility 07/05/2015  . Estrogen deficiency 07/05/2015  . Hyperglycemia  01/01/2015  . Lumbar spinal stenosis 11/13/2014  . Vertigo 10/05/2014  . Risk for falls 06/27/2014  . Low back pain on right side with sciatica 07/24/2013  . Edema 04/26/2013  . Encounter for Medicare annual wellness exam 04/11/2013  . GERD (gastroesophageal reflux disease)   . Bradycardia 05/19/2010  . Disorder of bone and cartilage 01/30/2009  . CONSTIPATION 11/01/2007  . Depression with anxiety 02/14/2007  . Hyperlipidemia LDL goal <130 02/04/2007  . Gout 02/04/2007  . Essential hypertension 02/04/2007  . DIVERTICULOSIS, COLON 02/04/2007  . Renal insufficiency 02/04/2007  . OSTEOARTHRITIS 02/04/2007  . Aneth DISEASE 02/04/2007   Past Medical History:  Diagnosis Date  . Basal cell carcinoma    removed  . Chest pain    a. 02/2017 MV: EF 54%, small, distal anterior wall/apical infacrt (likely misregistration), no ischemia-->Low risk; b. 02/2017 Echo: EF  55-60%, mild MR, PASP 68mmHg.  Marland Kitchen Chronic back pain    stenosis  . Constipation    takes Colace daily  . Depression    takes Zoloft daily  . Diverticulosis of colon   . GERD (gastroesophageal reflux disease)    atypical chest pain  . Gout    hx of;was on Allopurinol but taken off 6 wks ago by medical MD  . History of diverticulitis of colon   . HLD (hyperlipidemia)    takes Pravastatin daily  . HTN (hypertension)    takes Amlodipine and carvedilol  . OA (osteoarthritis)    right knee  . Osteopenia    takes Calcium and Vit D daily  . Vertigo    takes Antivert daily as needed   Past Surgical History:  Procedure Laterality Date  . APPENDECTOMY  at age 38  . BACK SURGERY    . CATARACT EXTRACTION Right 04/2006  . CHOLECYSTECTOMY    . COLONOSCOPY    . KNEE SURGERY Right    Medial meniscus tear  . LUMBAR LAMINECTOMY/DECOMPRESSION MICRODISCECTOMY Bilateral 11/13/2014   Procedure: Lumbar one-two Bilateral laminectomy and foraminotomy, Left Lumbar one-two microdiscectomy ;  Surgeon: Faythe Ghee, MD;  Location: [facility];  Service: Neurosurgery;  Laterality: Bilateral;  . NECK SURGERY     x 2;fusion  . PARTIAL COLECTOMY  09/1999   d/t diverticulitis  . TUBAL LIGATION     Social History   Tobacco Use  . Smoking status: Never Smoker  . Smokeless tobacco: Never Used  Substance Use Topics  . Alcohol use: No    Alcohol/week: 0.0 standard drinks  . Drug use: No   Family History  Problem Relation Age of Onset  . Cancer Father        throat and bladder  . Diabetes Father   . Hypertension Father   . Heart attack Father   . Stroke Mother   . Alzheimer's disease Mother   . Hypertension Mother    Allergies  Allergen Reactions  . Ace Inhibitors     REACTION: increased K+, increased creat.  . Cephalosporins     REACTION: reaction not known  . Colesevelam     REACTION: constipation  . Hctz [Hydrochlorothiazide]     Makes her feel "bad"  . Losartan     Increased K  .  Lovastatin     REACTION: doesn't work  . Raloxifene     REACTION: cramps  . Rosuvastatin     REACTION: myalgia  . Simvastatin     REACTION: myalgia   Current Outpatient Medications on File Prior to Visit  Medication  Sig Dispense Refill  . aspirin EC 81 MG tablet Take 1 tablet (81 mg total) by mouth daily. 90 tablet 3  . Calcium Carbonate-Vitamin D (CALCIUM 500 + D) 500-125 MG-UNIT TABS Take by mouth.    . Cranberry 250 MG CAPS Take 1 capsule by mouth daily.      Marland Kitchen docusate sodium (COLACE) 100 MG capsule Take 100-200 mg by mouth 2 (two) times daily. 100 mg in the morning and 200 mg at night    . fish oil-omega-3 fatty acids 1000 MG capsule Take 1 g by mouth at bedtime.     . meclizine (ANTIVERT) 25 MG tablet Take 25 mg by mouth 4 (four) times daily as needed for dizziness.     . Tetrahydrozoline HCl (EYE DROPS OP) Apply 1-2 drops to eye as needed (for dry eye).     No current facility-administered medications on file prior to visit.     Review of Systems  Constitutional: Negative for activity change, appetite change, fatigue, fever and unexpected weight change.  HENT: Negative for congestion, ear pain, rhinorrhea, sinus pressure and sore throat.   Eyes: Negative for pain, redness and visual disturbance.  Respiratory: Negative for cough, shortness of breath and wheezing.   Cardiovascular: Negative for chest pain and palpitations.  Gastrointestinal: Negative for abdominal pain, blood in stool, constipation and diarrhea.  Endocrine: Negative for polydipsia and polyuria.  Genitourinary: Negative for dysuria, frequency and urgency.  Musculoskeletal: Positive for arthralgias and back pain. Negative for myalgias.  Skin: Negative for pallor and rash.  Allergic/Immunologic: Negative for environmental allergies.  Neurological: Negative for dizziness, syncope and headaches.       Poor balance and falls    Hematological: Negative for adenopathy. Does not bruise/bleed easily.    Psychiatric/Behavioral: Negative for decreased concentration and dysphoric mood. The patient is not nervous/anxious.        Objective:   Physical Exam  Constitutional: She appears well-developed and well-nourished. No distress.  Well appearing elderly female   HENT:  Head: Normocephalic and atraumatic.  Right Ear: External ear normal.  Left Ear: External ear normal.  Mouth/Throat: Oropharynx is clear and moist.  Eyes: Pupils are equal, round, and reactive to light. Conjunctivae and EOM are normal. No scleral icterus.  Neck: Normal range of motion. Neck supple. No JVD present. Carotid bruit is not present. No tracheal deviation present. No thyromegaly present.  Cardiovascular: Normal rate, regular rhythm, normal heart sounds and intact distal pulses. Exam reveals no gallop.  Pulmonary/Chest: Effort normal and breath sounds normal. No respiratory distress. She has no wheezes. She exhibits no tenderness. No breast tenderness, discharge or bleeding.  Abdominal: Soft. Bowel sounds are normal. She exhibits no distension, no abdominal bruit and no mass. There is no tenderness.  Genitourinary: No breast tenderness, discharge or bleeding.  Genitourinary Comments: Breast exam: No mass, nodules, thickening, tenderness, bulging, retraction, inflamation, nipple discharge or skin changes noted.  No axillary or clavicular LA.      Musculoskeletal: Normal range of motion. She exhibits no edema or tenderness.  Lymphadenopathy:    She has no cervical adenopathy.  Neurological: She is alert. She has normal reflexes. No cranial nerve deficit. She exhibits normal muscle tone. Coordination normal.  Skin: Skin is warm and dry. No rash noted. No erythema. No pallor.  Solar lentigines diffusely Some sks   Psychiatric: She has a normal mood and affect. Cognition and memory are normal.  Pleasant  Mentally sharp  Assessment & Plan:   Problem List Items Addressed This Visit      Cardiovascular  and Mediastinum   Atherosclerosis of aorta ([facility])    No clinical changes  Will control HTN and cholesterol      Relevant Medications   amLODipine (NORVASC) 10 MG tablet   furosemide (LASIX) 20 MG tablet   hydrALAZINE (APRESOLINE) 10 MG tablet   pravastatin (PRAVACHOL) 20 MG tablet   Essential hypertension (Chronic)    bp in fair control at this time  BP Readings from Last 1 Encounters:  05/23/18 (!) 142/78   No changes needed Most recent labs reviewed  Disc lifstyle change with low sodium diet and exercise       Relevant Medications   amLODipine (NORVASC) 10 MG tablet   furosemide (LASIX) 20 MG tablet   hydrALAZINE (APRESOLINE) 10 MG tablet   pravastatin (PRAVACHOL) 20 MG tablet     Musculoskeletal and Integument   Disorder of bone and cartilage    Pt declines further dexa or any tx  On vit D Disc fall prev No fractures Disc exercise as tolerated         Genitourinary   Renal insufficiency    Labs stable to improved         Other   Hyperglycemia    Lab Results  Component Value Date   HGBA1C 5.7 05/16/2018  stable  disc imp of low glycemic diet and wt loss to prevent DM2       Hyperlipidemia LDL goal <130 (Chronic)    Disc goals for lipids and reasons to control them Rev last labs with pt Rev low sat fat diet in detail  Stable with pravastatin and diet       Relevant Medications   amLODipine (NORVASC) 10 MG tablet   furosemide (LASIX) 20 MG tablet   hydrALAZINE (APRESOLINE) 10 MG tablet   pravastatin (PRAVACHOL) 20 MG tablet   Risk for falls    Disc in detail  Strongly enc to use walker instead of cane  She agrees  Also aware of fracture risk with osteopenia       Routine general medical examination at a health care facility - Primary    Reviewed health habits including diet and exercise and skin cancer prevention Reviewed appropriate screening tests for age  Also reviewed health mt list, fam hx and immunization status , as well as social and  family history    amw reviewed  Pt declines hearing aides Disc fall risk and recommended use of walker Disc shingrix vaccine Disc flu shot- will get in the fall        Screening mammogram, encounter for    Scheduled annual screening mammogram ([facility]) Nl breast exam today  Encouraged monthly self exams         Relevant Orders   MM 3D SCREEN BREAST BILATERAL

## 2018-05-23 NOTE — Assessment & Plan Note (Signed)
Disc in detail  Strongly enc to use walker instead of cane  She agrees  Also aware of fracture risk with osteopenia

## 2018-05-23 NOTE — Assessment & Plan Note (Signed)
bp in fair control at this time  BP Readings from Last 1 Encounters:  05/23/18 (!) 142/78   No changes needed Most recent labs reviewed  Disc lifstyle change with low sodium diet and exercise

## 2018-06-03 IMAGING — DX DG CHEST 2V
2 series · 2 of 2 positions shown · non-contrast
Comparison: 02/23/2017

CLINICAL DATA: chest pain. Pt stated that she's been having chest
pains off and on for 1 week. She said it started in the middle of
her chest, but now hurts in her right side chest. No sob or other
pains. Hx of HTN

EXAM:
CHEST  2 VIEW

[chest lat]
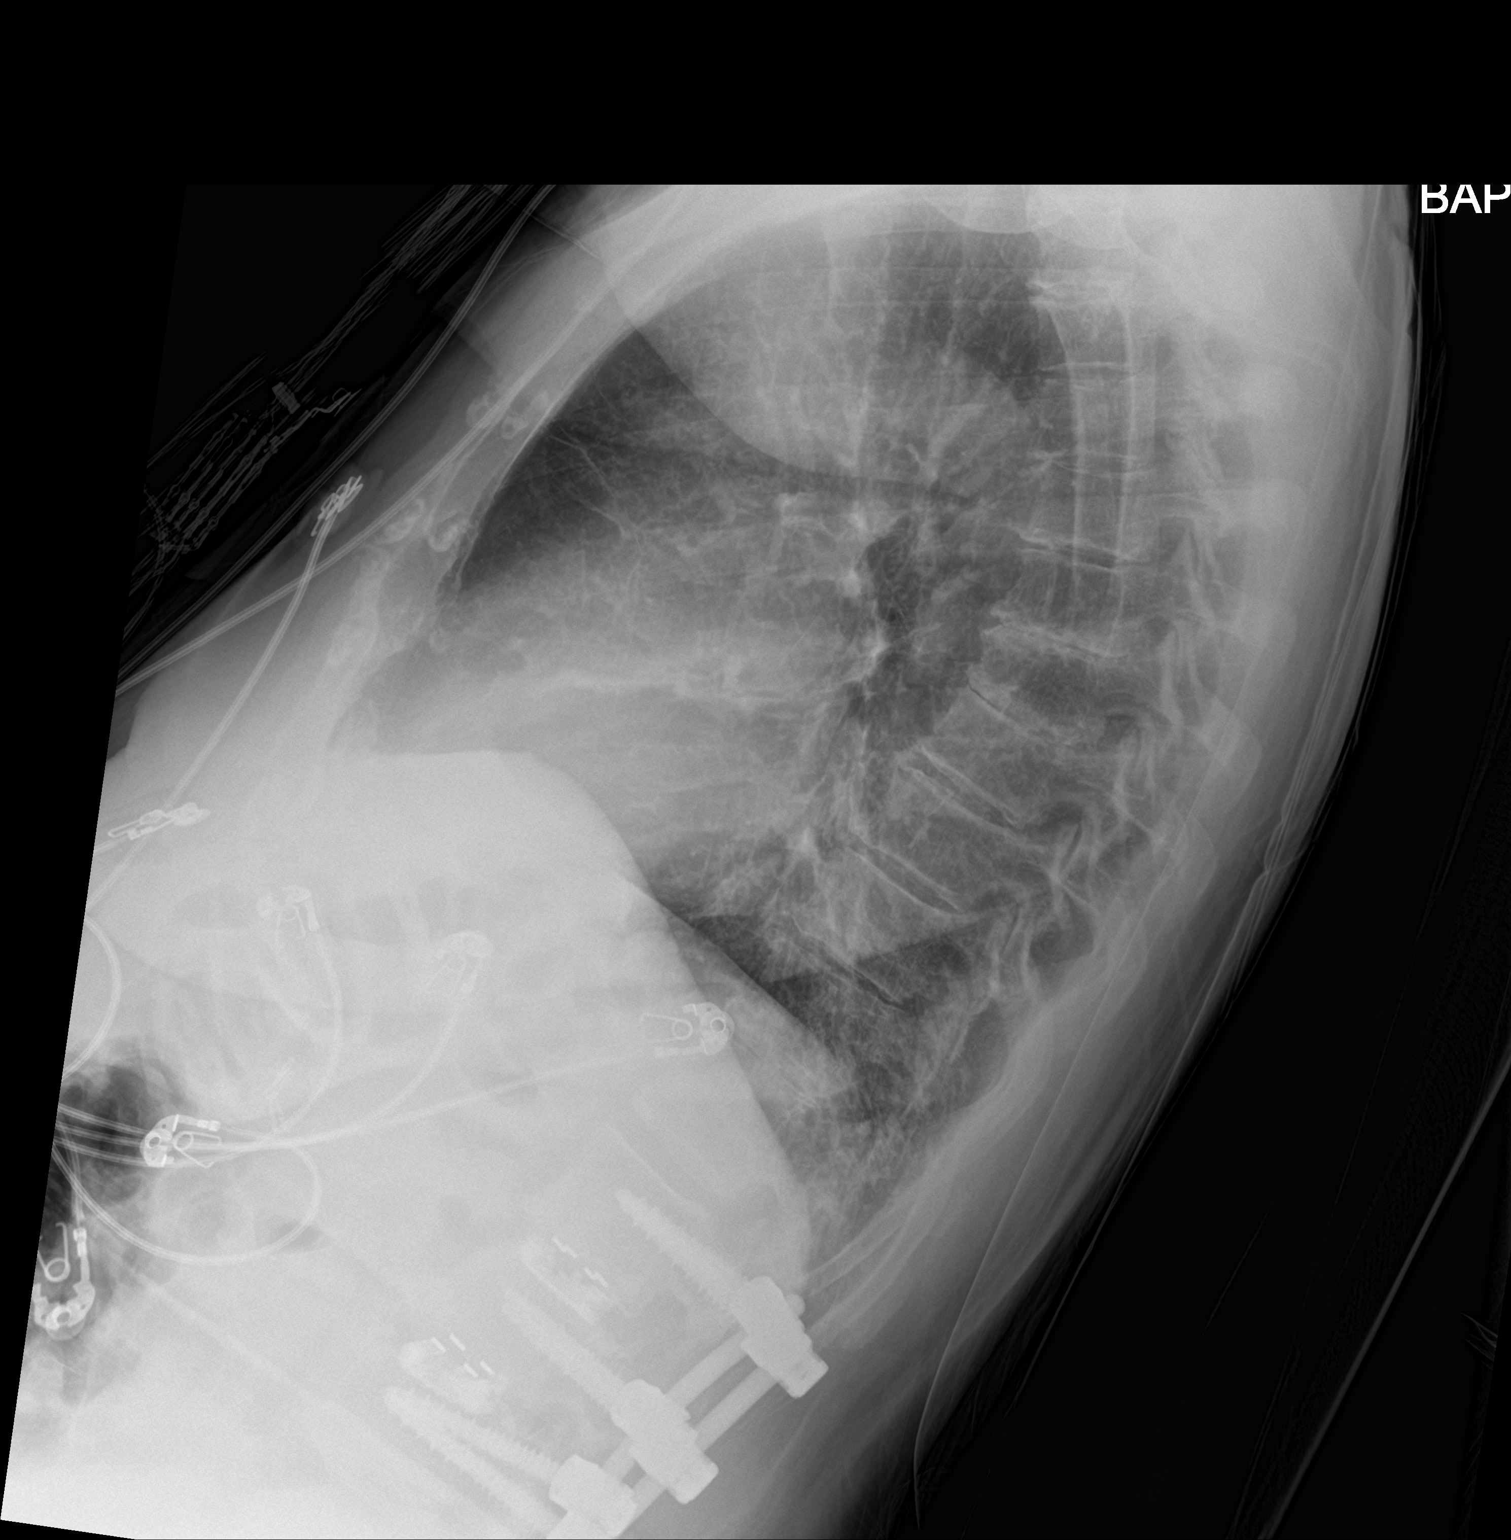

[chest ap]
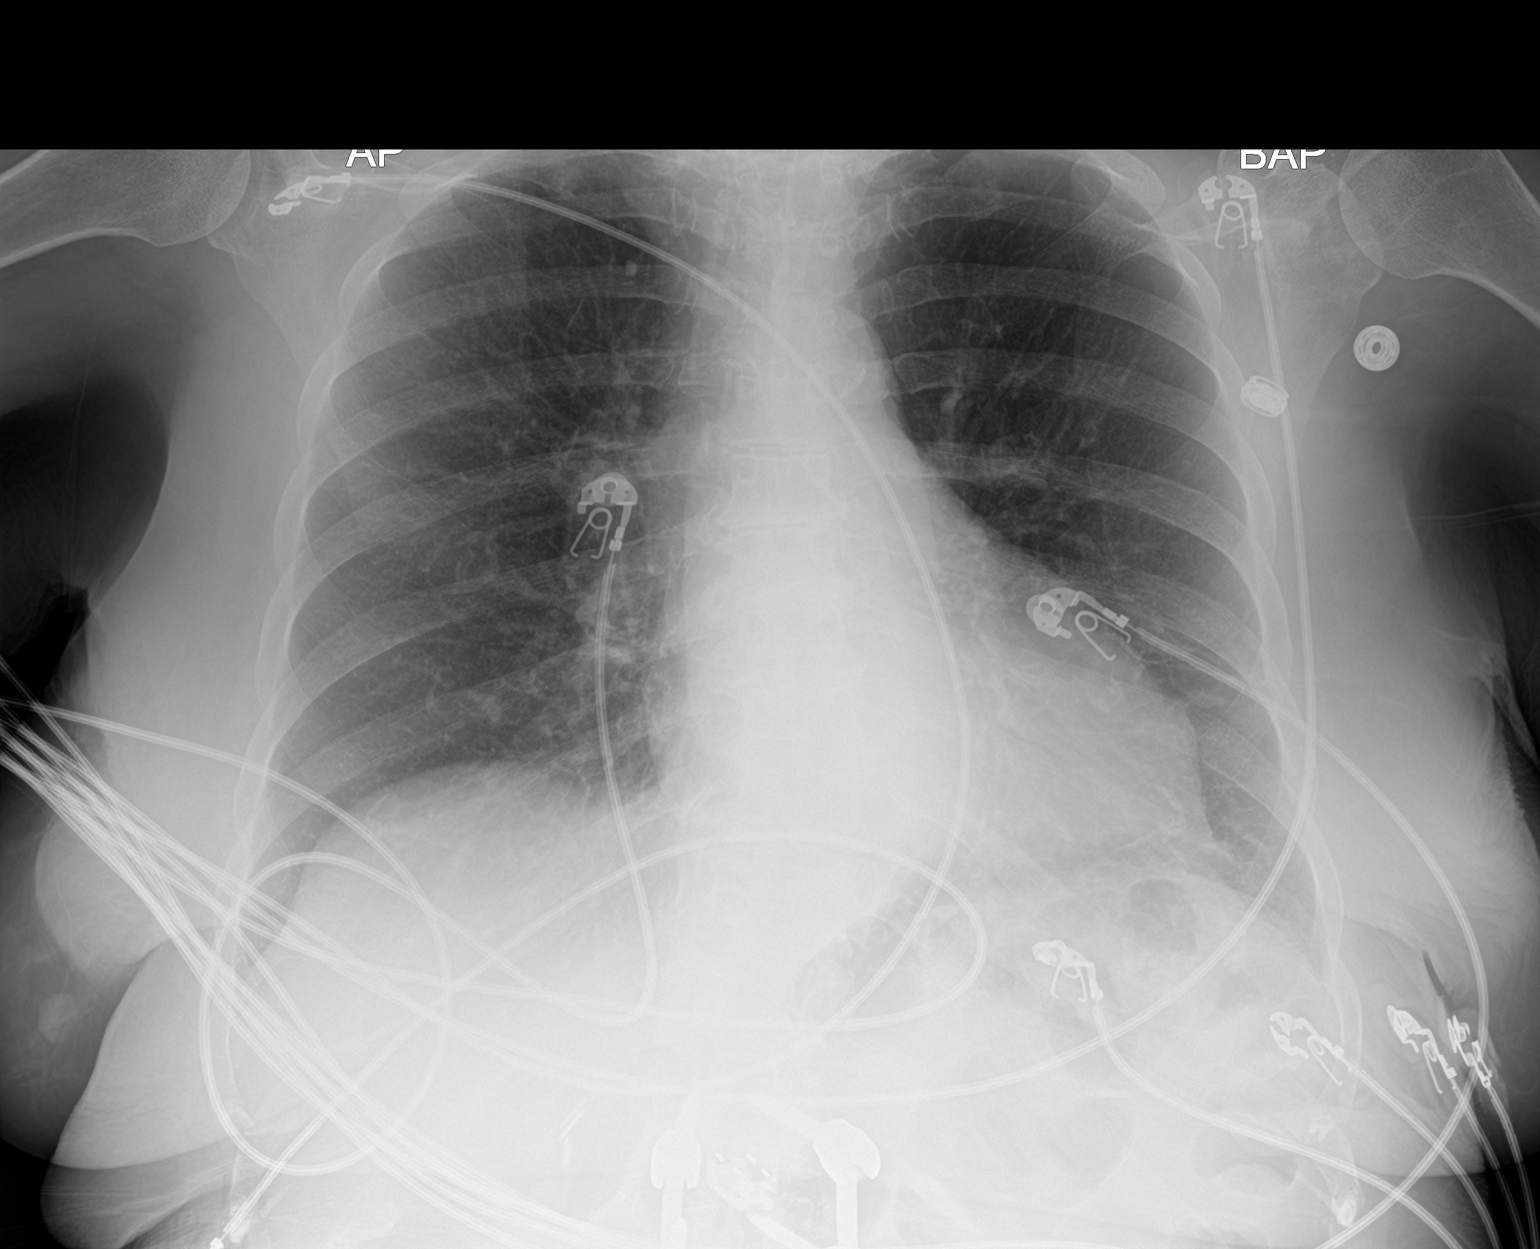

[2 of 2 positions shown; findings below may reference images not displayed]

FINDINGS: moderate thoracic spondylosis. Lower cervical spine fixation.
Numerous leads and wires project over the chest. Midline trachea.
Borderline cardiomegaly. Atherosclerosis in the transverse aorta. No
pleural effusion or pneumothorax. Mild scarring in the lingula.
IMPRESSION: No acute cardiopulmonary disease.

Aortic atherosclerosis.

## 2018-06-08 ENCOUNTER — Ambulatory Visit
Admission: RE | Admit: 2018-06-08 | Discharge: 2018-06-08 | Disposition: A | Discharge status: Home / Self Care | Payer: Medicare Other | Source: Ambulatory Visit | Attending: Family Medicine | Admitting: Family Medicine

## 2018-06-08 DIAGNOSIS — Z1231 Encounter for screening mammogram for malignant neoplasm of breast: Secondary | ICD-10-CM | POA: Insufficient documentation

## 2018-06-13 ENCOUNTER — Other Ambulatory Visit: Payer: Self-pay | Admitting: *Deleted

## 2018-06-13 ENCOUNTER — Inpatient Hospital Stay
Admission: RE | Admit: 2018-06-13 | Discharge: 2018-06-13 | Disposition: A | Discharge status: Home / Self Care | Payer: Self-pay | Source: Ambulatory Visit | Attending: *Deleted | Admitting: *Deleted

## 2018-06-13 DIAGNOSIS — Z9289 Personal history of other medical treatment: Secondary | ICD-10-CM

## 2018-07-15 ENCOUNTER — Encounter: Payer: Self-pay | Admitting: Family Medicine

## 2018-09-20 ENCOUNTER — Encounter: Payer: Self-pay | Admitting: Family Medicine

## 2018-09-20 ENCOUNTER — Ambulatory Visit (INDEPENDENT_AMBULATORY_CARE_PROVIDER_SITE_OTHER): Payer: Medicare Other | Admitting: Family Medicine

## 2018-09-20 VITALS — BP 140/54 | HR 69 | Temp 98.4°F | Ht 62.5 in | Wt 143.5 lb

## 2018-09-20 DIAGNOSIS — J0110 Acute frontal sinusitis, unspecified: Secondary | ICD-10-CM

## 2018-09-20 DIAGNOSIS — J019 Acute sinusitis, unspecified: Secondary | ICD-10-CM | POA: Insufficient documentation

## 2018-09-20 DIAGNOSIS — S0083XA Contusion of other part of head, initial encounter: Secondary | ICD-10-CM | POA: Diagnosis not present

## 2018-09-20 MED ORDER — AMOXICILLIN 500 MG PO CAPS: 500.0000 mg | ORAL_CAPSULE | Freq: Three times a day (TID) | ORAL | 0 refills | Status: DC

## 2018-09-20 MED ORDER — BENZONATATE 200 MG PO CAPS: 200.0000 mg | ORAL_CAPSULE | Freq: Three times a day (TID) | ORAL | 1 refills | Status: DC | PRN

## 2018-09-20 NOTE — Progress Notes (Signed)
Subjective:    Patient ID: Linda Robinson, female    DOB: 1929-08-16, 84 y.o.   MRN: [id number]  HPI  Here for nasal symptoms /sinus  Also a fall   Cold symptoms started Friday night- with a little cough  Cough worsened - non productive to start -so she took mucinex DM (phlegm is white now)  Unsure if mucinex dm made her more dizzy   No other otc medicines   Sinus pain (before injury) - frontal and both sides  Nasal d/c is white to colored   No fever  Ears are ok -no pain  Had ST originally- better now   She got dizzy from her sinus trouble and then fell Head hit night stand - bruised on L face  No LOC   No longer feels dizzy  BP Readings from Last 3 Encounters:  09/20/18 (!) 140/54  05/23/18 (!) 142/78  05/16/18 130/60   Pulse Readings from Last 3 Encounters:  09/20/18 69  05/23/18 (!) 58  05/16/18 60   Wt Readings from Last 3 Encounters:  09/20/18 143 lb 8 oz (65.1 kg)  05/23/18 147 lb 12.8 oz (67 kg)  05/16/18 147 lb 8 oz (66.9 kg)   Patient Active Problem List   Diagnosis Date Noted  . Acute sinusitis 09/20/2018  . Contusion of face 09/20/2018  . Screening mammogram, encounter for 05/23/2018  . Substernal chest pain 08/16/2017  . Atherosclerosis of aorta ([place]) 03/09/2017  . Weight loss 02/23/2017  . Routine general medical examination at a health care facility 07/05/2015  . Estrogen deficiency 07/05/2015  . Hyperglycemia 01/01/2015  . Lumbar spinal stenosis 11/13/2014  . Vertigo 10/05/2014  . Risk for falls 06/27/2014  . Low back pain on right side with sciatica 07/24/2013  . Edema 04/26/2013  . Encounter for Medicare annual wellness exam 04/11/2013  . GERD (gastroesophageal reflux disease)   . Bradycardia 05/19/2010  . Disorder of bone and cartilage 01/30/2009  . CONSTIPATION 11/01/2007  . Depression with anxiety 02/14/2007  . Hyperlipidemia LDL goal <130 02/04/2007  . Gout 02/04/2007  . Essential hypertension 02/04/2007  . DIVERTICULOSIS,  COLON 02/04/2007  . Renal insufficiency 02/04/2007  . OSTEOARTHRITIS 02/04/2007  . [place] DISEASE 02/04/2007   Past Medical History:  Diagnosis Date  . Basal cell carcinoma    removed  . Chest pain    a. 02/2017 MV: EF 54%, small, distal anterior wall/apical infacrt (likely misregistration), no ischemia-->Low risk; b. 02/2017 Echo: EF 55-60%, mild MR, PASP 78mmHg.  Marland Kitchen Chronic back pain    stenosis  . Constipation    takes Colace daily  . Depression    takes Zoloft daily  . Diverticulosis of colon   . GERD (gastroesophageal reflux disease)    atypical chest pain  . Gout    hx of;was on Allopurinol but taken off 6 wks ago by medical MD  . History of diverticulitis of colon   . HLD (hyperlipidemia)    takes Pravastatin daily  . HTN (hypertension)    takes Amlodipine and carvedilol  . OA (osteoarthritis)    right knee  . Osteopenia    takes Calcium and Vit D daily  . Vertigo    takes Antivert daily as needed   Past Surgical History:  Procedure Laterality Date  . APPENDECTOMY  at age 63  . BACK SURGERY    . CATARACT EXTRACTION Right 04/2006  . CHOLECYSTECTOMY    . COLONOSCOPY    . KNEE SURGERY Right  Medial meniscus tear  . LUMBAR LAMINECTOMY/DECOMPRESSION MICRODISCECTOMY Bilateral 11/13/2014   Procedure: Lumbar one-two Bilateral laminectomy and foraminotomy, Left Lumbar one-two microdiscectomy ;  Surgeon: Faythe Ghee, MD;  Location: [facility];  Service: Neurosurgery;  Laterality: Bilateral;  . NECK SURGERY     x 2;fusion  . PARTIAL COLECTOMY  09/1999   d/t diverticulitis  . TUBAL LIGATION     Social History   Tobacco Use  . Smoking status: Never Smoker  . Smokeless tobacco: Never Used  Substance Use Topics  . Alcohol use: No    Alcohol/week: 0.0 standard drinks  . Drug use: No   Family History  Problem Relation Age of Onset  . Cancer Father        throat and bladder  . Diabetes Father   . Hypertension Father   . Heart attack Father   .  Stroke Mother   . Alzheimer's disease Mother   . Hypertension Mother    Allergies  Allergen Reactions  . Ace Inhibitors     REACTION: increased K+, increased creat.  . Cephalosporins     REACTION: reaction not known  . Colesevelam     REACTION: constipation  . Hctz [Hydrochlorothiazide]     Makes her feel "bad"  . Losartan     Increased K  . Lovastatin     REACTION: doesn't work  . Raloxifene     REACTION: cramps  . Rosuvastatin     REACTION: myalgia  . Simvastatin     REACTION: myalgia   Current Outpatient Medications on File Prior to Visit  Medication Sig Dispense Refill  . amLODipine (NORVASC) 10 MG tablet Take 1 tablet (10 mg total) by mouth daily. 90 tablet 3  . aspirin EC 81 MG tablet Take 1 tablet (81 mg total) by mouth daily. 90 tablet 3  . Calcium Carbonate-Vitamin D (CALCIUM 500 + D) 500-125 MG-UNIT TABS Take by mouth.    . Cranberry 250 MG CAPS Take 1 capsule by mouth daily.      Marland Kitchen docusate sodium (COLACE) 100 MG capsule Take 100-200 mg by mouth 2 (two) times daily. 100 mg in the morning and 200 mg at night    . fish oil-omega-3 fatty acids 1000 MG capsule Take 1 g by mouth at bedtime.     . furosemide (LASIX) 20 MG tablet TAKE 1 TABLET DAILY AS NEEDED FOR SWELLING 90 tablet 3  . hydrALAZINE (APRESOLINE) 10 MG tablet Take 1 tablet (10 mg total) by mouth daily. 90 tablet 3  . meclizine (ANTIVERT) 25 MG tablet Take 25 mg by mouth 4 (four) times daily as needed for dizziness.     Marland Kitchen omeprazole (PRILOSEC) 20 MG capsule Take 1 capsule (20 mg total) by mouth daily. 90 capsule 3  . pravastatin (PRAVACHOL) 20 MG tablet Take 1 tablet (20 mg total) by mouth daily. 90 tablet 3  . ranitidine (ZANTAC) 150 MG capsule Take 1 capsule (150 mg total) by mouth every evening. 90 capsule 3  . sertraline (ZOLOFT) 100 MG tablet Take 1 tablet (100 mg total) by mouth daily. 90 tablet 3  . Tetrahydrozoline HCl (EYE DROPS OP) Apply 1-2 drops to eye as needed (for dry eye).     No current  facility-administered medications on file prior to visit.      Review of Systems  Constitutional: Positive for appetite change. Negative for fatigue and fever.  HENT: Positive for congestion, ear pain, postnasal drip, rhinorrhea, sinus pressure, sinus pain and sore throat.  Negative for nosebleeds.   Eyes: Negative for pain, redness and itching.  Respiratory: Positive for cough. Negative for shortness of breath and wheezing.   Cardiovascular: Negative for chest pain.  Gastrointestinal: Negative for abdominal pain, diarrhea, nausea and vomiting.  Endocrine: Negative for polyuria.  Genitourinary: Negative for dysuria, frequency and urgency.  Musculoskeletal: Negative for arthralgias and myalgias.  Skin: Positive for wound.  Allergic/Immunologic: Negative for immunocompromised state.  Neurological: Positive for dizziness and headaches. Negative for tremors, seizures, syncope, facial asymmetry, speech difficulty, weakness and numbness.       Dizziness is now improved    Hematological: Negative for adenopathy. Does not bruise/bleed easily.  Psychiatric/Behavioral: Negative for dysphoric mood. The patient is not nervous/anxious.        Objective:   Physical Exam Constitutional:      General: She is not in acute distress.    Appearance: Normal appearance. She is well-developed. She is not ill-appearing.  HENT:     Head: Normocephalic and atraumatic.     Comments: Bilateral frontal sinus tenderness  Wound (skin tear) noted L cheekbone Diffuse bruising over L mid face Mild tenderness (no bony step off noted)     Right Ear: Tympanic membrane, ear canal and external ear normal.     Left Ear: Tympanic membrane, ear canal and external ear normal.     Nose: Congestion and rhinorrhea present.     Mouth/Throat:     Mouth: Mucous membranes are moist.     Pharynx: No oropharyngeal exudate or posterior oropharyngeal erythema.     Comments: Clear pnd Eyes:     General:        Right eye: No  discharge.        Left eye: No discharge.     Conjunctiva/sclera: Conjunctivae normal.     Pupils: Pupils are equal, round, and reactive to light.  Neck:     Musculoskeletal: Normal range of motion and neck supple.  Cardiovascular:     Rate and Rhythm: Normal rate and regular rhythm.     Heart sounds: No murmur.  Pulmonary:     Effort: Pulmonary effort is normal. No respiratory distress.     Breath sounds: No stridor. No wheezing, rhonchi or rales.     Comments: Good air exch No rales or rhonchi  No wheeze  Chest:     Chest wall: No tenderness.  Lymphadenopathy:     Cervical: No cervical adenopathy.  Skin:    General: Skin is warm and dry.     Findings: Bruising present. No rash.     Comments: Bruising diffusely over L mid face 1 cm skin tear over L cheek bone (slt oozing)  Mildly tender    Neurological:     General: No focal deficit present.     Mental Status: She is alert. Mental status is at baseline.     Cranial Nerves: No cranial nerve deficit.     Motor: No weakness.     Coordination: Coordination normal.     Deep Tendon Reflexes: Reflexes normal.     Comments: No facial droop or neuro changes   Psychiatric:        Mood and Affect: Mood normal.           Assessment & Plan:   Problem List Items Addressed This Visit      Respiratory   Acute sinusitis - Primary    S/p uri with congestion and facial pain as well as dizziness Cover with augmentin  Fluids/sympt care  Tessalon for cough Update if not starting to improve in a week or if worsening         Relevant Medications   amoxicillin (AMOXIL) 500 MG capsule   benzonatate (TESSALON) 200 MG capsule     Other   Contusion of face    From fall (due to dizziness from sinus symptoms)  Bruising of L face with small skin tear over cheek bone Disc wound care (soap/water) -pt is utd for tetanus shot Cold compress to face  Watch for s/s of head injury-handout given  Update if not starting to improve in a week  or if worsening   Disc fall prev incl use of walker if she feels unsteady

## 2018-09-20 NOTE — Patient Instructions (Addendum)
For sinus infection - take amoxicillin  Drink fluids Tessalon for cough as needed  Rest  mucinex (plain) is ok for congestion (not the DM)   Use you walker for fall prevention  Move slow- do not change positions quickly  Call our office if dizziness comes back   For skin tear on face- clean with soap and water  Use cool compress to help discomfort  Keep covered until it scabs over   Let us know if any symptoms of head injury (new headache or dizziness or nausea)

## 2018-09-21 NOTE — Assessment & Plan Note (Signed)
S/p uri with congestion and facial pain as well as dizziness Cover with augmentin  Fluids/sympt care Tessalon for cough Update if not starting to improve in a week or if worsening

## 2018-09-21 NOTE — Assessment & Plan Note (Signed)
From fall (due to dizziness from sinus symptoms)  Bruising of L face with small skin tear over cheek bone Disc wound care (soap/water) -pt is utd for tetanus shot Cold compress to face  Watch for s/s of head injury-handout given  Update if not starting to improve in a week or if worsening   Disc fall prev incl use of walker if she feels unsteady

## 2018-09-23 ENCOUNTER — Telehealth: Payer: Self-pay | Admitting: *Deleted

## 2018-09-23 MED ORDER — AZITHROMYCIN 250 MG PO TABS: ORAL_TABLET | ORAL | 0 refills | Status: DC

## 2018-09-23 NOTE — Telephone Encounter (Signed)
Pt left message at triage stating she was seen 12/24 and given an abx for her s/s. She states she has began taking it, but has not had any improvement and is wanting to know should she just continue current med, or if she is needing something additional as Dr Glori Bickers wanted her to update [place] today. pls advise

## 2018-09-23 NOTE — Telephone Encounter (Signed)
I am hesitant to add any more cough medicine since we were unsure if the DM (in mucinex DM) made her dizzy If she needs a refill of tessalon please do it   I will add another abx (zpack) to cover atypical bacteria Continue and finish the amoxicillin   F/u Monday with me for a re check  If symptoms worsen-alert [place] and get to ER if needed (esp if sob or fever)

## 2018-09-23 NOTE — Telephone Encounter (Signed)
Pt said nothing has gotten worse, she just hasn't had any improvement in her sxs at all since she saw Dr. Glori Bickers. Pt said she is coughing a lot, she feels very weak and tired. Pt said she doesn't have any dizziness but when she stands up or turns around she does feel even weaker then normal. Pt said she hasn't had to much facial pain besides where she injured her face with her fall but that pain is getting better. No fever and no SOB and no wheezing. Pt said her main sxs is her cough. She started coughing on the phone and it sounded very productive with congestion so I asked her if she's coughing up and phlegm and she said yes and it has a little yellow color to it.   [facility]

## 2018-09-23 NOTE — Telephone Encounter (Signed)
Pt notified of Dr. Marliss Coots comments and instructions. Pt didn't need anymore tessalon caps she still has some. Pt will go get new abx and f/u appt scheduled for Monday with ER guidelines given

## 2018-09-23 NOTE — Telephone Encounter (Signed)
What symptoms are the same and what symptoms are worse?  Cough  Production  Facial pain  Dizziness   Any fever  Any wheezing ?  How is her facial contusion?   thanks

## 2018-09-23 NOTE — Addendum Note (Signed)
Addended by: Loura Pardon A on: 09/23/2018 12:43 PM   Modules accepted: Orders

## 2018-09-26 ENCOUNTER — Ambulatory Visit (INDEPENDENT_AMBULATORY_CARE_PROVIDER_SITE_OTHER): Payer: Medicare Other | Admitting: Family Medicine

## 2018-09-26 ENCOUNTER — Encounter: Payer: Self-pay | Admitting: Family Medicine

## 2018-09-26 VITALS — BP 134/70 | HR 61 | Temp 98.6°F | Ht 62.5 in | Wt 143.8 lb

## 2018-09-26 DIAGNOSIS — S0083XD Contusion of other part of head, subsequent encounter: Secondary | ICD-10-CM

## 2018-09-26 DIAGNOSIS — J0110 Acute frontal sinusitis, unspecified: Secondary | ICD-10-CM

## 2018-09-26 NOTE — Assessment & Plan Note (Signed)
S/p tx with amoxicillin and now zpak Starting to feel better  Sinus pressure is improved-now that she is able to get mucous to move (also cough) Enc saline ns and plain expectorant  Finish zpack Update if not starting to improve in a week or if worsening

## 2018-09-26 NOTE — Assessment & Plan Note (Addendum)
S/p fall - with a palpable knot on L cheekbone  Pain and bruising are much improved  Recommend cool compress  Skin tear is healed  Enc to update if symptoms do not continue to improve (would consider imaging)

## 2018-09-26 NOTE — Patient Instructions (Signed)
I'm glad you are gradually feeling better  Try some nasal saline spray over the counter for congestion /sinus pressure  Drink lots of fluids Finish the zpak - I think it is helping  A plain like expectorant (no DM) like mucinex or robitussin may help loosen mucous as well   Use a cold compress on your face if it helps If pain gets worse (from injury) please let me know and we can image you  Otherwise -you will have swelling and a knot for a long while  Take care of yourself

## 2018-09-26 NOTE — Progress Notes (Signed)
Subjective:    Patient ID: Linda Robinson, female    DOB: 1929/09/08, 84 y.o.   MRN: [id number]  HPI Here for re check of sinus symptoms  Was tx for sinusitis with amoxicillin  We added zpack to that on 12/27 when she was not improving further with congestion and cough   Now improving  Some nasal congestion  Now able to get mucous out of nose and chest (loosening up)  D/c is yellow   Tolerating zpack   Taking tessalon  Nothing over the counter  Drinking lots of fluids    Also check injury from fall  Still has a knot over L cheekbone Is sore to the touch  Bruising is a lot better   Headaches are much improved Still some across forehead-not nearly as bad  R ear felt full  Clears throat a lot  No fever   No more falls  No dizziness  Very careful when she feels tired or weak    Wt Readings from Last 3 Encounters:  09/26/18 143 lb 12 oz (65.2 kg)  09/20/18 143 lb 8 oz (65.1 kg)  05/23/18 147 lb 12.8 oz (67 kg)   25.87 kg/m    Patient Active Problem List   Diagnosis Date Noted  . Acute sinusitis 09/20/2018  . Contusion of face 09/20/2018  . Screening mammogram, encounter for 05/23/2018  . Substernal chest pain 08/16/2017  . Atherosclerosis of aorta ([place]) 03/09/2017  . Weight loss 02/23/2017  . Routine general medical examination at a health care facility 07/05/2015  . Estrogen deficiency 07/05/2015  . Hyperglycemia 01/01/2015  . Lumbar spinal stenosis 11/13/2014  . Vertigo 10/05/2014  . Risk for falls 06/27/2014  . Low back pain on right side with sciatica 07/24/2013  . Edema 04/26/2013  . Encounter for Medicare annual wellness exam 04/11/2013  . GERD (gastroesophageal reflux disease)   . Bradycardia 05/19/2010  . Disorder of bone and cartilage 01/30/2009  . CONSTIPATION 11/01/2007  . Depression with anxiety 02/14/2007  . Hyperlipidemia LDL goal <130 02/04/2007  . Gout 02/04/2007  . Essential hypertension 02/04/2007  . DIVERTICULOSIS, COLON  02/04/2007  . Renal insufficiency 02/04/2007  . OSTEOARTHRITIS 02/04/2007  . [place] DISEASE 02/04/2007   Past Medical History:  Diagnosis Date  . Basal cell carcinoma    removed  . Chest pain    a. 02/2017 MV: EF 54%, small, distal anterior wall/apical infacrt (likely misregistration), no ischemia-->Low risk; b. 02/2017 Echo: EF 55-60%, mild MR, PASP 22mmHg.  Marland Kitchen Chronic back pain    stenosis  . Constipation    takes Colace daily  . Depression    takes Zoloft daily  . Diverticulosis of colon   . GERD (gastroesophageal reflux disease)    atypical chest pain  . Gout    hx of;was on Allopurinol but taken off 6 wks ago by medical MD  . History of diverticulitis of colon   . HLD (hyperlipidemia)    takes Pravastatin daily  . HTN (hypertension)    takes Amlodipine and carvedilol  . OA (osteoarthritis)    right knee  . Osteopenia    takes Calcium and Vit D daily  . Vertigo    takes Antivert daily as needed   Past Surgical History:  Procedure Laterality Date  . APPENDECTOMY  at age 43  . BACK SURGERY    . CATARACT EXTRACTION Right 04/2006  . CHOLECYSTECTOMY    . COLONOSCOPY    . KNEE SURGERY Right  Medial meniscus tear  . LUMBAR LAMINECTOMY/DECOMPRESSION MICRODISCECTOMY Bilateral 11/13/2014   Procedure: Lumbar one-two Bilateral laminectomy and foraminotomy, Left Lumbar one-two microdiscectomy ;  Surgeon: Faythe Ghee, MD;  Location: [facility];  Service: Neurosurgery;  Laterality: Bilateral;  . NECK SURGERY     x 2;fusion  . PARTIAL COLECTOMY  09/1999   d/t diverticulitis  . TUBAL LIGATION     Social History   Tobacco Use  . Smoking status: Never Smoker  . Smokeless tobacco: Never Used  Substance Use Topics  . Alcohol use: No    Alcohol/week: 0.0 standard drinks  . Drug use: No   Family History  Problem Relation Age of Onset  . Cancer Father        throat and bladder  . Diabetes Father   . Hypertension Father   . Heart attack Father   . Stroke  Mother   . Alzheimer's disease Mother   . Hypertension Mother    Allergies  Allergen Reactions  . Ace Inhibitors     REACTION: increased K+, increased creat.  . Cephalosporins     REACTION: reaction not known  . Colesevelam     REACTION: constipation  . Hctz [Hydrochlorothiazide]     Makes her feel "bad"  . Losartan     Increased K  . Lovastatin     REACTION: doesn't work  . Raloxifene     REACTION: cramps  . Rosuvastatin     REACTION: myalgia  . Simvastatin     REACTION: myalgia   Current Outpatient Medications on File Prior to Visit  Medication Sig Dispense Refill  . amLODipine (NORVASC) 10 MG tablet Take 1 tablet (10 mg total) by mouth daily. 90 tablet 3  . amoxicillin (AMOXIL) 500 MG capsule Take 1 capsule (500 mg total) by mouth 3 (three) times daily. 30 capsule 0  . aspirin EC 81 MG tablet Take 1 tablet (81 mg total) by mouth daily. 90 tablet 3  . azithromycin (ZITHROMAX Z-PAK) 250 MG tablet Take 2 pills by mouth today and then 1 pill daily for 4 days 6 tablet 0  . benzonatate (TESSALON) 200 MG capsule Take 1 capsule (200 mg total) by mouth 3 (three) times daily as needed for cough. Do not bite pill 30 capsule 1  . Calcium Carbonate-Vitamin D (CALCIUM 500 + D) 500-125 MG-UNIT TABS Take by mouth.    . Cranberry 250 MG CAPS Take 1 capsule by mouth daily.      Marland Kitchen docusate sodium (COLACE) 100 MG capsule Take 100-200 mg by mouth 2 (two) times daily. 100 mg in the morning and 200 mg at night    . fish oil-omega-3 fatty acids 1000 MG capsule Take 1 g by mouth at bedtime.     . furosemide (LASIX) 20 MG tablet TAKE 1 TABLET DAILY AS NEEDED FOR SWELLING 90 tablet 3  . hydrALAZINE (APRESOLINE) 10 MG tablet Take 1 tablet (10 mg total) by mouth daily. 90 tablet 3  . meclizine (ANTIVERT) 25 MG tablet Take 25 mg by mouth 4 (four) times daily as needed for dizziness.     Marland Kitchen omeprazole (PRILOSEC) 20 MG capsule Take 1 capsule (20 mg total) by mouth daily. 90 capsule 3  . pravastatin  (PRAVACHOL) 20 MG tablet Take 1 tablet (20 mg total) by mouth daily. 90 tablet 3  . ranitidine (ZANTAC) 150 MG capsule Take 1 capsule (150 mg total) by mouth every evening. 90 capsule 3  . sertraline (ZOLOFT) 100 MG tablet Take  1 tablet (100 mg total) by mouth daily. 90 tablet 3  . Tetrahydrozoline HCl (EYE DROPS OP) Apply 1-2 drops to eye as needed (for dry eye).     No current facility-administered medications on file prior to visit.     Review of Systems  Constitutional: Positive for appetite change. Negative for fatigue and fever.  HENT: Positive for congestion, ear pain, postnasal drip, rhinorrhea, sinus pressure, sinus pain and sore throat. Negative for nosebleeds.   Eyes: Negative for pain, redness and itching.  Respiratory: Positive for cough. Negative for shortness of breath and wheezing.   Cardiovascular: Negative for chest pain.  Gastrointestinal: Negative for abdominal pain, diarrhea, nausea and vomiting.  Endocrine: Negative for polyuria.  Genitourinary: Negative for dysuria, frequency and urgency.  Musculoskeletal: Negative for arthralgias and myalgias.       Contusion on L face is sore but improving   Allergic/Immunologic: Negative for immunocompromised state.  Neurological: Positive for headaches. Negative for dizziness, tremors, syncope, weakness and numbness.  Hematological: Negative for adenopathy. Does not bruise/bleed easily.  Psychiatric/Behavioral: Negative for dysphoric mood. The patient is not nervous/anxious.        Objective:   Physical Exam Constitutional:      General: She is not in acute distress.    Appearance: Normal appearance. She is well-developed. She is not ill-appearing.  HENT:     Head: Normocephalic and atraumatic.     Comments: Tender maxillary sinuses bilaterally  Also R frontal and ethmoid    Right Ear: Tympanic membrane, ear canal and external ear normal.     Left Ear: Tympanic membrane, ear canal and external ear normal.     Nose:  Congestion present. No rhinorrhea.     Comments: Nares are injected and congested  No signs of nasal trauma     Mouth/Throat:     Mouth: Mucous membranes are moist.     Pharynx: No oropharyngeal exudate or posterior oropharyngeal erythema.  Eyes:     General:        Right eye: No discharge.        Left eye: No discharge.     Conjunctiva/sclera: Conjunctivae normal.     Pupils: Pupils are equal, round, and reactive to light.  Neck:     Musculoskeletal: Normal range of motion and neck supple.  Cardiovascular:     Rate and Rhythm: Normal rate and regular rhythm.  Pulmonary:     Effort: Pulmonary effort is normal. No respiratory distress.     Breath sounds: Normal breath sounds. No stridor. No wheezing, rhonchi or rales.  Lymphadenopathy:     Cervical: No cervical adenopathy.  Skin:    General: Skin is warm and dry.     Findings: Bruising present. No rash.     Comments: Old bruising on L face is much improved Skin tear is healed with scab  1 cm prominent lump (hematoma vs callus) noted over mid L cheekbone (mildly tender)  No crepitus  No involvement of nasal or orbital area   Neurological:     General: No focal deficit present.     Mental Status: She is alert.     Cranial Nerves: No cranial nerve deficit.     Motor: No weakness.  Psychiatric:        Mood and Affect: Mood normal.           Assessment & Plan:   Problem List Items Addressed This Visit      Respiratory   Acute sinusitis - Primary  S/p tx with amoxicillin and now zpak Starting to feel better  Sinus pressure is improved-now that she is able to get mucous to move (also cough) Enc saline ns and plain expectorant  Finish zpack Update if not starting to improve in a week or if worsening          Other   Contusion of face    S/p fall - with a palpable knot on L cheekbone  Pain and bruising are much improved  Recommend cool compress  Skin tear is healed  Enc to update if symptoms do not continue to  improve (would consider imaging)

## 2018-10-11 ENCOUNTER — Encounter: Payer: Self-pay | Admitting: Family Medicine

## 2018-10-12 ENCOUNTER — Ambulatory Visit (INDEPENDENT_AMBULATORY_CARE_PROVIDER_SITE_OTHER)
Admission: RE | Admit: 2018-10-12 | Discharge: 2018-10-12 | Disposition: A | Discharge status: Home / Self Care | Payer: Medicare Other | Source: Ambulatory Visit | Attending: Family Medicine | Admitting: Family Medicine

## 2018-10-12 ENCOUNTER — Encounter: Payer: Self-pay | Admitting: Family Medicine

## 2018-10-12 ENCOUNTER — Ambulatory Visit (INDEPENDENT_AMBULATORY_CARE_PROVIDER_SITE_OTHER): Payer: Medicare Other | Admitting: Family Medicine

## 2018-10-12 ENCOUNTER — Ambulatory Visit: Payer: Medicare Other | Admitting: Internal Medicine

## 2018-10-12 VITALS — BP 140/70 | HR 63 | Temp 98.3°F | Ht 62.5 in | Wt 143.8 lb

## 2018-10-12 DIAGNOSIS — M545 Low back pain, unspecified: Secondary | ICD-10-CM

## 2018-10-12 DIAGNOSIS — M4807 Spinal stenosis, lumbosacral region: Secondary | ICD-10-CM | POA: Diagnosis not present

## 2018-10-12 MED ORDER — PREDNISONE 20 MG PO TABS: ORAL_TABLET | ORAL | 0 refills | Status: DC

## 2018-10-12 MED ORDER — TIZANIDINE HCL 2 MG PO CAPS: 2.0000 mg | ORAL_CAPSULE | Freq: Every evening | ORAL | 0 refills | Status: DC | PRN

## 2018-10-12 MED ORDER — TRAMADOL HCL 50 MG PO TABS: 50.0000 mg | ORAL_TABLET | Freq: Four times a day (QID) | ORAL | 0 refills | Status: AC | PRN

## 2018-10-12 NOTE — Progress Notes (Signed)
Dr. Frederico Hamman T. Merlean Pizzini, MD, [facility] [place], [address] Phone: [phone] Fax: [phone]  10/12/2018  Patient: Linda Robinson, MRN: [id number], DOB: [birth year], 85 y.o.  Primary Physician:  Tower, Wynelle Fanny, MD   Chief Complaint  Patient presents with  . Back Pain    Lower back- started last Friday-gradually gotten worse  . Fall    09/16/18   Subjective:   Linda Robinson is a 85 y.o. very pleasant female patient who presents with the following:  The patient has a complex spine history.  She has a history of remote cervical spine fusion x2.  She also has a history of lumbar spine surgery in 2016 by Dr. Hal Neer, and she had bilateral lumbar laminectomy, decompression and fusion she also had a lumbar 1 2 microdiscectomy at the time.  The full operative note is included below.  She is here today with some recurrence of low back pain and wishes to discuss this with me.  Has seen [facility] - has surgery 4 or 5 years ago Dr. Hal Neer most recently.   Fell 12/20.   Hurting on the R side. R top of posterior pelvis.   She cannot recall any specific injury.  She does have pain primarily in the central lumbar spine region as well as on the right side and some into the upper pelvis.  She is not having any radicular symptoms, numbness, tingling, or weakness.  Preop diagnosis: Severe spinal stenosis L1-2 with complete myelographic block, multi-factorial Postop diagnosis: Spondylosis L1-2 bilateral with herniated disc L1-2 left Procedure: Bilateral L1-2 decompressive laminectomy Left L1-2 microdiscectomy Surgeon: Kritzer Asst.: Nundkumar  After being placed in the prone position the patient's back was prepped and draped in the usual sterile fashion. Localizing x-ray was taken prior to incision to identify the appropriate level. Midline incision was made above the spinous processes of L1 and L2. Using Bovie cutting  current the incision was carried on the spinous processes. Subperiosteal dissection was then carried out bilaterally on the spinous processes lamina facet joint and self-retaining retractor was placed for exposure. X-ray showed approach the appropriate level. Using the Leksell rongeur spinous processes interspinous ligament were removed. Starting the patient's left side generous laminotomy was performed by removing the inferior two thirds of the L1 lamina the medial one third of the facet joint the superior one third of the L4 to lamina. Residual bone and hypertrophic ligamentum flavum removed in a piecemeal fashion. Similar decompression was then carried on the right side and then residual midline structures were removed to complete the bilateral decompression. At this time the microscope was draped and used to evaluate the disc on the left side at L1-2. It was found to be significantly extruded we elected to incise it and cleaned out with pituitary Terrie rongeurs and curettes and this was successfully done. At this time inspection was carried out in all directions for any evidence of residual compression and none could be identified. Irrigation was carried out and any bleeding controlled with upper coagulation Gelfoam. The was then closed in multiple layers of Vicryl on the muscle fascia subcutaneous and subcuticular tissues. Dermabond and Steri-Strips were placed on the skin. A sterile dressing was then applied and the patient was extubated and taken to recovery room in stable condition.        Electronically signed by Faythe Ghee, MD at 11/13/2014 11:31 AM    Past Medical History, Surgical History,  Social History, Family History, Problem List, Medications, and Allergies have been reviewed and updated if relevant.  Patient Active Problem List   Diagnosis Date Noted  . Acute sinusitis 09/20/2018  . Contusion of face 09/20/2018  . Screening mammogram, encounter for 05/23/2018  . Substernal chest  pain 08/16/2017  . Atherosclerosis of aorta ([place]) 03/09/2017  . Weight loss 02/23/2017  . Routine general medical examination at a health care facility 07/05/2015  . Estrogen deficiency 07/05/2015  . Hyperglycemia 01/01/2015  . Lumbar spinal stenosis 11/13/2014  . Vertigo 10/05/2014  . Risk for falls 06/27/2014  . Low back pain on right side with sciatica 07/24/2013  . Edema 04/26/2013  . Encounter for Medicare annual wellness exam 04/11/2013  . GERD (gastroesophageal reflux disease)   . Bradycardia 05/19/2010  . Disorder of bone and cartilage 01/30/2009  . CONSTIPATION 11/01/2007  . Depression with anxiety 02/14/2007  . Hyperlipidemia LDL goal <130 02/04/2007  . Gout 02/04/2007  . Essential hypertension 02/04/2007  . DIVERTICULOSIS, COLON 02/04/2007  . Renal insufficiency 02/04/2007  . OSTEOARTHRITIS 02/04/2007  . [place] DISEASE 02/04/2007    Past Medical History:  Diagnosis Date  . Basal cell carcinoma    removed  . Chest pain    a. 02/2017 MV: EF 54%, small, distal anterior wall/apical infacrt (likely misregistration), no ischemia-->Low risk; b. 02/2017 Echo: EF 55-60%, mild MR, PASP 67mmHg.  Marland Kitchen Chronic back pain    stenosis  . Constipation    takes Colace daily  . Depression    takes Zoloft daily  . Diverticulosis of colon   . GERD (gastroesophageal reflux disease)    atypical chest pain  . Gout    hx of;was on Allopurinol but taken off 6 wks ago by medical MD  . History of diverticulitis of colon   . HLD (hyperlipidemia)    takes Pravastatin daily  . HTN (hypertension)    takes Amlodipine and carvedilol  . OA (osteoarthritis)    right knee  . Osteopenia    takes Calcium and Vit D daily  . Vertigo    takes Antivert daily as needed    Past Surgical History:  Procedure Laterality Date  . APPENDECTOMY  at age 8  . BACK SURGERY    . CATARACT EXTRACTION Right 04/2006  . CHOLECYSTECTOMY    . COLONOSCOPY    . KNEE SURGERY Right    Medial meniscus  tear  . LUMBAR LAMINECTOMY/DECOMPRESSION MICRODISCECTOMY Bilateral 11/13/2014   Procedure: Lumbar one-two Bilateral laminectomy and foraminotomy, Left Lumbar one-two microdiscectomy ;  Surgeon: Faythe Ghee, MD;  Location: [facility];  Service: Neurosurgery;  Laterality: Bilateral;  . NECK SURGERY     x 2;fusion  . PARTIAL COLECTOMY  09/1999   d/t diverticulitis  . TUBAL LIGATION      Social History   Socioeconomic History  . Marital status: Married    Spouse name: Not on file  . Number of children: Not on file  . Years of education: Not on file  . Highest education level: Not on file  Occupational History  . Occupation: Retired  Scientific laboratory technician  . Financial resource strain: Not on file  . Food insecurity:    Worry: Not on file    Inability: Not on file  . Transportation needs:    Medical: Not on file    Non-medical: Not on file  Tobacco Use  . Smoking status: Never Smoker  . Smokeless tobacco: Never Used  Substance and Sexual Activity  .  Alcohol use: No    Alcohol/week: 0.0 standard drinks  . Drug use: No  . Sexual activity: Never    Birth control/protection: Post-menopausal  Lifestyle  . Physical activity:    Days per week: Not on file    Minutes per session: Not on file  . Stress: Not on file  Relationships  . Social connections:    Talks on phone: Not on file    Gets together: Not on file    Attends religious service: Not on file    Active member of club or organization: Not on file    Attends meetings of clubs or organizations: Not on file    Relationship status: Not on file  . Intimate partner violence:    Fear of current or ex partner: Not on file    Emotionally abused: Not on file    Physically abused: Not on file    Forced sexual activity: Not on file  Other Topics Concern  . Not on file  Social History Narrative  . Not on file    Family History  Problem Relation Age of Onset  . Cancer Father        throat and bladder  . Diabetes Father   .  Hypertension Father   . Heart attack Father   . Stroke Mother   . Alzheimer's disease Mother   . Hypertension Mother     Allergies  Allergen Reactions  . Ace Inhibitors     REACTION: increased K+, increased creat.  . Cephalosporins     REACTION: reaction not known  . Colesevelam     REACTION: constipation  . Hctz [Hydrochlorothiazide]     Makes her feel "bad"  . Losartan     Increased K  . Lovastatin     REACTION: doesn't work  . Raloxifene     REACTION: cramps  . Rosuvastatin     REACTION: myalgia  . Simvastatin     REACTION: myalgia    Medication list reviewed and updated in full in [place].  GEN: no acute illness or fever CV: No chest pain or shortness of breath MSK: detailed above Neuro: neurological signs are described above ROS O/w per HPI  Objective:   BP 140/70   Pulse 63   Temp 98.3 F (36.8 C) (Oral)   Ht 5' 2.5" (1.588 m)   Wt 143 lb 12 oz (65.2 kg)   BMI 25.87 kg/m    GEN: Well-developed,well-nourished,in no acute distress; alert,appropriate and cooperative throughout examination HEENT: Normocephalic and atraumatic without obvious abnormalities. Ears, externally no deformities PULM: Breathing comfortably in no respiratory distress EXT: No clubbing, cyanosis, or edema PSYCH: Normally interactive. Cooperative during the interview. Pleasant. Friendly and conversant. Not anxious or depressed appearing. Normal, full affect.  Range of motion at  the waist: Flexion, extension, lateral bending and rotation: Forward flexion is limited to approximately 45 degrees.  Extension is limited by pain.  Lateral bending and rotation are preserved.  No echymosis or edema Rises to examination table with mild difficulty Gait: minimally antalgic  Inspection/Deformity: N Paraspinus Tenderness: She has central spinous process tenderness at L3-S1 and she has right-sided paraspinous tenderness from L3-S1.  B Ankle Dorsiflexion (L5,4): 5/5 B Great Toe  Dorsiflexion (L5,4): 5/5 Heel Walk (L5): WNL Toe Walk (S1): WNL Rise/Squat (L4): WNL, mild pain  SENSORY B Medial Foot (L4): WNL B Dorsum (L5): WNL B Lateral (S1): WNL Light Touch: WNL Pinprick: WNL  REFLEXES Knee (L4): 1+ Ankle (S1): 1+  B  SLR, seated: neg B SLR, supine: neg B FABER: neg B Reverse FABER: neg B Greater Troch: NT B Log Roll: neg B Stork: NT B Sciatic Notch: NT   Radiology: CLINICAL DATA:  Upper lumbar back pain. Bilateral hip and thigh pain. Spondylosis without myelopathy.  EXAM: LUMBAR MYELOGRAM  FLUOROSCOPY TIME:  0 min 43 seconds.  Ten images.  PROCEDURE: After thorough discussion of risks and benefits of the procedure including bleeding, infection, injury to nerves, blood vessels, adjacent structures as well as headache and CSF leak, written and oral informed consent was obtained. Consent was obtained by Dr. Nelson Chimes. Time out form was completed.  Patient was positioned prone on the fluoroscopy table. Local anesthesia was provided with 1% lidocaine without epinephrine after prepped and draped in the usual sterile fashion. Puncture was performed at right L5-S1 using a 3 1/2 inch 22-gauge spinal needle via right approach. Using a single pass through the dura, the needle was placed within the thecal sac, with return of clear CSF. 15 mL of Omnipaque-180 was injected into the thecal sac, with normal opacification of the nerve roots and cauda equina consistent with free flow within the subarachnoid space.  I personally performed the lumbar puncture and administered the intrathecal contrast. I also personally performed acquisition of the myelogram images.  TECHNIQUE: Contiguous axial images were obtained through the Lumbar spine after the intrathecal infusion of infusion. Coronal and sagittal reconstructions were obtained of the axial image sets.  COMPARISON:  MRI 03/22/2014  FINDINGS: LUMBAR MYELOGRAM FINDINGS:  T12 has  diminutive ribs. There is curvature convex to the left with the apex at L2.  There is multifactorial spinal stenosis worse at L1-2 than at L2-3. Neural compression could occur on either or both sides at those levels. Mild lateral recess narrowing is seen bilaterally at L4-5. Lateral views show 2 mm of anterolisthesis at L1-2 and 3 mm retrolisthesis at L2-3. Anterior extradural defects are present, largest at L1-2 and L2-3. Standing flexion extension views show a worsened appearance of the stenosis at L1-2. There is 2 mm of anterolisthesis at L4-5 with flexion.  CT LUMBAR MYELOGRAM FINDINGS:  T11-12: Mild bulging of the disc. Mild facet degeneration. No stenosis.  T12-L1: No disc pathology. Mild facet hypertrophy. No stenosis. Conus tip at lower L1.  L1-2: Advanced chronic disc degeneration more severe on the left. Near complete loss of disc height. Small endplate osteophytes. Broad-based disc herniation more prominent towards the left. Bilateral facet and ligamentous hypertrophy. Severe stenosis of the lateral recesses and foramina, left worse than right. Neural compression seems likely at this level in the lateral recesses and the intervertebral foramina, particularly the left.  L2-3: Advanced chronic disc degeneration more severe on the left. Endplate osteophytes and circumferential protrusion of disc material. Retrolisthesis of 3 mm. Facet and ligamentous hypertrophy. Stenosis of the lateral recesses and neural foramina that could cause neural compression on either or both sides. Findings slightly worse on the left.  L3-4: Previous left hemilaminectomy. Mild bulging of the disc, more prominent on the left. No compressive central canal stenosis. Foraminal narrowing on the left that could possibly affect the exiting L3 nerve root.  L4-5: Previous partial left hemilaminectomy. Bilateral facet arthropathy with hypertrophic change, right worse than  left. Anterolisthesis of 1 mm. Bulging of the disc. Narrowing of the subarticular lateral recesses that could cause neural compression on either or both sides, particularly with standing or flexion.  L5-S1: Bilateral facet arthropathy with 1 mm of anterolisthesis. Mild bulging of the  disc. No compressive stenosis of the canal. Moderate foraminal narrowing bilaterally but without gross neural compression.  IMPRESSION: Curvature convex to the right. Degenerative changes throughout the lumbar spine, most pronounced on the left at L1-2 and L2-3.  L1-2: Broad-based herniation of disc material more prominent towards the left. Bilateral facet and ligamentous hypertrophy. Stenosis of both subarticular lateral recesses that could cause neural compression on either side. Foraminal stenosis bilaterally, left worse than right. Foraminal neural compression is quite likely, particularly on the left.  L2-3: Retrolisthesis of 3 mm. Broad-based herniation of disc material more prominent towards the left. Bilateral facet and ligamentous hypertrophy. Stenosis of both subarticular lateral recesses that could cause neural compression on either side. Foraminal narrowing that could cause neural compression on either side, more likely the left.  L3-4: Previous left-sided hemilaminectomy. Left foraminal osteophyte and disc protrusion that would have potential to affect the left L3 nerve root.  L4-5: Previous left-sided hemilaminectomy. Bilateral facet arthropathy. Bulging of the disc. Narrowing of the subarticular lateral recesses and foramina. Definite neural compression is not established, but this appearance could worsen with standing or flexion.  L5-S1: Bilateral facet arthropathy. Moderate foraminal narrowing bilaterally but without definite focal neural compression.   Electronically Signed   By: Nelson Chimes M.D.   On: 11/02/2014 14:34  Assessment and Plan:   Acute right-sided  low back pain without sciatica - Plan: DG Lumbar Spine Complete  Lumbosacral spinal stenosis  Very complex prior spine history with reportedly 5 prior operations.  Most recent surgery in 2016.  She appears to be neurovascularly intact today.  Isolated pain centrally as well as on the right side of the lumbosacral spine.  No radicular symptoms.  Check a plain film which is pending. Grossly, her hardware appears intact.  I am going to give her some oral steroids to take as well as some low-dose muscle relaxants and some tramadol to use on a as needed basis for pain.  Given prior surgeries, if symptoms persist greater than 3 to 4 weeks, advanced imaging would be indicated.  Orders Placed This Encounter  Procedures  . DG Lumbar Spine Complete   New Prescriptions   PREDNISONE (DELTASONE) 20 MG TABLET    2 tabs po for 5 days, then 1 tab po for 5 days   TIZANIDINE (ZANAFLEX) 2 MG CAPSULE    Take 1 capsule (2 mg total) by mouth at bedtime as needed for muscle spasms.   TRAMADOL (ULTRAM) 50 MG TABLET    Take 1 tablet (50 mg total) by mouth every 6 (six) hours as needed for up to 7 days.   Signed,  Maud Deed. Skiler Olden, MD   Outpatient Encounter Medications as of 10/12/2018  Medication Sig  . amLODipine (NORVASC) 10 MG tablet Take 1 tablet (10 mg total) by mouth daily.  Marland Kitchen aspirin EC 81 MG tablet Take 1 tablet (81 mg total) by mouth daily.  . Calcium Carbonate-Vitamin D (CALCIUM 500 + D) 500-125 MG-UNIT TABS Take by mouth.  . Cranberry 250 MG CAPS Take 1 capsule by mouth daily.    Marland Kitchen docusate sodium (COLACE) 100 MG capsule Take 100-200 mg by mouth 2 (two) times daily. 100 mg in the morning and 200 mg at night  . fish oil-omega-3 fatty acids 1000 MG capsule Take 1 g by mouth at bedtime.   . furosemide (LASIX) 20 MG tablet TAKE 1 TABLET DAILY AS NEEDED FOR SWELLING  . hydrALAZINE (APRESOLINE) 10 MG tablet Take 1 tablet (10 mg total) by  mouth daily.  . meclizine (ANTIVERT) 25 MG tablet Take 25 mg  by mouth 4 (four) times daily as needed for dizziness.   Marland Kitchen omeprazole (PRILOSEC) 20 MG capsule Take 1 capsule (20 mg total) by mouth daily.  . pravastatin (PRAVACHOL) 20 MG tablet Take 1 tablet (20 mg total) by mouth daily.  . ranitidine (ZANTAC) 150 MG capsule Take 1 capsule (150 mg total) by mouth every evening.  . sertraline (ZOLOFT) 100 MG tablet Take 1 tablet (100 mg total) by mouth daily.  . Tetrahydrozoline HCl (EYE DROPS OP) Apply 1-2 drops to eye as needed (for dry eye).  . predniSONE (DELTASONE) 20 MG tablet 2 tabs po for 5 days, then 1 tab po for 5 days  . tizanidine (ZANAFLEX) 2 MG capsule Take 1 capsule (2 mg total) by mouth at bedtime as needed for muscle spasms.  . traMADol (ULTRAM) 50 MG tablet Take 1 tablet (50 mg total) by mouth every 6 (six) hours as needed for up to 7 days.  . [DISCONTINUED] amoxicillin (AMOXIL) 500 MG capsule Take 1 capsule (500 mg total) by mouth 3 (three) times daily.  . [DISCONTINUED] azithromycin (ZITHROMAX Z-PAK) 250 MG tablet Take 2 pills by mouth today and then 1 pill daily for 4 days  . [DISCONTINUED] benzonatate (TESSALON) 200 MG capsule Take 1 capsule (200 mg total) by mouth 3 (three) times daily as needed for cough. Do not bite pill   No facility-administered encounter medications on file as of 10/12/2018.

## 2018-10-17 ENCOUNTER — Encounter: Payer: Self-pay | Admitting: Family Medicine

## 2018-10-17 ENCOUNTER — Ambulatory Visit (INDEPENDENT_AMBULATORY_CARE_PROVIDER_SITE_OTHER): Payer: Medicare Other | Admitting: Family Medicine

## 2018-10-17 ENCOUNTER — Telehealth: Payer: Self-pay | Admitting: Family Medicine

## 2018-10-17 VITALS — BP 160/72 | HR 70 | Temp 98.2°F | Ht 62.5 in | Wt 143.0 lb

## 2018-10-17 DIAGNOSIS — I10 Essential (primary) hypertension: Secondary | ICD-10-CM | POA: Diagnosis not present

## 2018-10-17 DIAGNOSIS — H9191 Unspecified hearing loss, right ear: Secondary | ICD-10-CM | POA: Diagnosis not present

## 2018-10-17 NOTE — Telephone Encounter (Signed)
Patient advised. Patient wants to know if she should wait on starting Flonase until she is done with prednisone and just do saline rinses at this time? Please advise.

## 2018-10-17 NOTE — Assessment & Plan Note (Signed)
Suspect inner ear effusion. Difficulty assessing weber, though possibly more on affected side. And unable to hear any air transmission on the right. Will send to Audiology as it seems possible that hearing difficulty has been present for a while. Recommended

## 2018-10-17 NOTE — Telephone Encounter (Signed)
Ok. Dose adjustment made by pharmacy  She is taking the appropriate dose.   OK to plan to take 2 tablets as on prescription.   Linda Robinson

## 2018-10-17 NOTE — Telephone Encounter (Signed)
Spoke with Dr. Einar Pheasant. Ok to wait on getting Flonase and just using saline rinse. If symptoms improve with this regimen patient does not have to get Flonase. Patient advised.

## 2018-10-17 NOTE — Assessment & Plan Note (Signed)
BP elevated likely in setting of taking prednisone. Unclear about dosing as seems to be taking more than prescribed. Lower and repeat. Advised home monitoring and follow-up in 1-2 weeks if elevated off prednisone.

## 2018-10-17 NOTE — Telephone Encounter (Signed)
Pt called office due to seeing Dr.Cody today. Pt was told to call and let her know the dosage on the Prednisone. She is taking Prednisone 10mg .

## 2018-10-17 NOTE — Patient Instructions (Addendum)
Check the dose of the prednisone tablet  -- For 10 mg or 20 mg -- Let us know which dose   Blood pressure - check your blood pressure at home over the next 1-2 weeks - If blood pressure is still >140/90 after you stop the prednisone call back to make an appointment to see Tower, Wynelle Fanny, MD    Hearing loss - this is likely because you have some fluid behind your ear - Use a neti pot or saline rinse - Start Flonase daily as well - Audiology referral - to have hearing checked

## 2018-10-17 NOTE — Progress Notes (Signed)
Subjective:     Linda Robinson is a 85 y.o. female presenting for Hearing Problem (Started yesterday 10/17/2018- all of a sudden had trouble hearing. Can hardly hear herself talk. She has started to use ear wax removal drops as of yesterday. No pain. Does have a noise in her ear. )     HPI #Hearing loss - started yesterday morning - right ear - difficulty hearing herself talk - came on suddenly  - tried ear wax removal - w/o improvement yet - hears extra sounds - water running, banging  Did have a fall before christmas w/ the flu  #HTN - started prednisone last week - normally her bp is well controlled - thinks her bottle from home says to take 4 daily and no 2 as prescribed   Review of Systems  Constitutional: Negative for chills and fever.  HENT: Positive for tinnitus (noise). Negative for congestion, ear discharge, ear pain, postnasal drip and rhinorrhea.   Respiratory: Negative for shortness of breath.   Cardiovascular: Negative for chest pain.     Social History   Tobacco Use  Smoking Status Never Smoker  Smokeless Tobacco Never Used        Objective:    BP Readings from Last 3 Encounters:  10/17/18 (!) 160/72  10/12/18 140/70  09/26/18 134/70   Wt Readings from Last 3 Encounters:  10/17/18 143 lb (64.9 kg)  10/12/18 143 lb 12 oz (65.2 kg)  09/26/18 143 lb 12 oz (65.2 kg)    BP (!) 160/72   Pulse 70   Temp 98.2 F (36.8 C)   Ht 5' 2.5" (1.588 m)   Wt 143 lb (64.9 kg)   SpO2 99%   BMI 25.74 kg/m    Physical Exam Constitutional:      General: She is not in acute distress.    Appearance: She is well-developed. She is not diaphoretic.  HENT:     Head: Normocephalic and atraumatic.     Right Ear: Ear canal normal. A middle ear effusion is present. Tympanic membrane is not erythematous, retracted or bulging.     Left Ear: Ear canal normal. A middle ear effusion is present. Tympanic membrane is not erythematous, retracted or bulging.   Ears:     Weber exam findings: lateralizes right.    Right Rinne: BC > AC.    Nose: Mucosal edema and rhinorrhea present.     Right Sinus: No maxillary sinus tenderness or frontal sinus tenderness.     Left Sinus: No maxillary sinus tenderness or frontal sinus tenderness.     Mouth/Throat:     Pharynx: Uvula midline. Posterior oropharyngeal erythema present. No oropharyngeal exudate.     Tonsils: Swelling: 0 on the right. 0 on the left.  Eyes:     General: No scleral icterus.    Conjunctiva/sclera: Conjunctivae normal.  Neck:     Musculoskeletal: Neck supple.  Cardiovascular:     Rate and Rhythm: Normal rate and regular rhythm.     Heart sounds: Normal heart sounds. No murmur.  Pulmonary:     Effort: Pulmonary effort is normal. No respiratory distress.     Breath sounds: Normal breath sounds.  Lymphadenopathy:     Cervical: No cervical adenopathy.  Skin:    General: Skin is warm and dry.     Capillary Refill: Capillary refill takes less than 2 seconds.  Neurological:     Mental Status: She is alert.  Assessment & Plan:   Problem List Items Addressed This Visit      Cardiovascular and Mediastinum   Essential hypertension (Chronic)    BP elevated likely in setting of taking prednisone. Unclear about dosing as seems to be taking more than prescribed. Lower and repeat. Advised home monitoring and follow-up in 1-2 weeks if elevated off prednisone.         Nervous and Auditory   Hearing loss of right ear - Primary    Suspect inner ear effusion. Difficulty assessing weber, though possibly more on affected side. And unable to hear any air transmission on the right. Will send to Audiology as it seems possible that hearing difficulty has been present for a while. Recommended       Relevant Orders   Ambulatory referral to Audiology       Return in about 2 weeks (around 10/31/2018).  Lesleigh Noe, MD

## 2018-11-01 ENCOUNTER — Ambulatory Visit: Payer: Medicare Other | Admitting: Family Medicine

## 2018-11-04 ENCOUNTER — Other Ambulatory Visit: Payer: Self-pay | Admitting: Family Medicine

## 2018-11-04 NOTE — Telephone Encounter (Signed)
If given on 1/15 this is early I think unless she is taking it differently ?

## 2018-11-04 NOTE — Telephone Encounter (Signed)
Last office visit 10/17/2018 for hearing loss with Dr. Einar Pheasant.  Last refilled 10/12/2018 for #30 with no refills by Dr. Lorelei Pont.  Next Appt: 05/29/2019 for CPE.  Ok to refill?

## 2018-11-29 ENCOUNTER — Other Ambulatory Visit: Payer: Self-pay | Admitting: Otolaryngology

## 2018-11-29 DIAGNOSIS — H90A21 Sensorineural hearing loss, unilateral, right ear, with restricted hearing on the contralateral side: Secondary | ICD-10-CM

## 2018-12-03 ENCOUNTER — Other Ambulatory Visit: Payer: Self-pay

## 2018-12-03 ENCOUNTER — Encounter (HOSPITAL_COMMUNITY): Payer: Self-pay | Admitting: Emergency Medicine

## 2018-12-03 ENCOUNTER — Emergency Department (HOSPITAL_COMMUNITY)
Admission: EM | Admit: 2018-12-03 | Discharge: 2018-12-03 | Disposition: A | Discharge status: Home / Self Care | Payer: Medicare Other | Attending: Emergency Medicine | Admitting: Emergency Medicine

## 2018-12-03 DIAGNOSIS — Z7982 Long term (current) use of aspirin: Secondary | ICD-10-CM | POA: Insufficient documentation

## 2018-12-03 DIAGNOSIS — R55 Syncope and collapse: Secondary | ICD-10-CM | POA: Insufficient documentation

## 2018-12-03 DIAGNOSIS — M542 Cervicalgia: Secondary | ICD-10-CM | POA: Diagnosis not present

## 2018-12-03 DIAGNOSIS — Z85828 Personal history of other malignant neoplasm of skin: Secondary | ICD-10-CM | POA: Diagnosis not present

## 2018-12-03 DIAGNOSIS — N189 Chronic kidney disease, unspecified: Secondary | ICD-10-CM | POA: Diagnosis not present

## 2018-12-03 DIAGNOSIS — I129 Hypertensive chronic kidney disease with stage 1 through stage 4 chronic kidney disease, or unspecified chronic kidney disease: Secondary | ICD-10-CM | POA: Diagnosis not present

## 2018-12-03 DIAGNOSIS — Z79899 Other long term (current) drug therapy: Secondary | ICD-10-CM | POA: Insufficient documentation

## 2018-12-03 LAB — CBC
HCT: 42.1 % (ref 36.0–46.0)
Hemoglobin: 13.3 g/dL (ref 12.0–15.0)
MCH: 28.2 pg (ref 26.0–34.0)
MCHC: 31.6 g/dL (ref 30.0–36.0)
MCV: 89.2 fL (ref 80.0–100.0)
PLATELETS: 206 10*3/uL (ref 150–400)
RBC: 4.72 MIL/uL (ref 3.87–5.11)
RDW: 13.1 % (ref 11.5–15.5)
WBC: 11.2 10*3/uL — ABNORMAL HIGH (ref 4.0–10.5)
nRBC: 0 % (ref 0.0–0.2)

## 2018-12-03 LAB — BASIC METABOLIC PANEL
Anion gap: 8 (ref 5–15)
BUN: 22 mg/dL (ref 8–23)
CO2: 26 mmol/L (ref 22–32)
Calcium: 9.1 mg/dL (ref 8.9–10.3)
Chloride: 104 mmol/L (ref 98–111)
Creatinine, Ser: 1.2 mg/dL — ABNORMAL HIGH (ref 0.44–1.00)
GFR calc Af Amer: 47 mL/min — ABNORMAL LOW (ref >60)
GFR, EST NON AFRICAN AMERICAN: 41 mL/min — AB (ref >60)
Glucose, Bld: 122 mg/dL — ABNORMAL HIGH (ref 70–99)
POTASSIUM: 3.8 mmol/L (ref 3.5–5.1)
Sodium: 138 mmol/L (ref 135–145)

## 2018-12-03 LAB — I-STAT TROPONIN, ED: Troponin i, poc: 0.02 ng/mL (ref 0.00–0.08)

## 2018-12-03 MED ORDER — SODIUM CHLORIDE 0.9 % IV SOLN: 1000.0000 mL | INTRAVENOUS | Status: DC

## 2018-12-03 MED ORDER — SODIUM CHLORIDE 0.9 % IV BOLUS (SEPSIS)
500.0000 mL | Freq: Once | INTRAVENOUS | Status: AC
  Administered 2018-12-03: 500 mL via INTRAVENOUS

## 2018-12-03 MED ORDER — ACETAMINOPHEN 325 MG PO TABS
650.0000 mg | ORAL_TABLET | Freq: Once | ORAL | Status: AC
  Administered 2018-12-03: 650 mg via ORAL
  Filled 2018-12-03: qty 2

## 2018-12-03 NOTE — ED Notes (Signed)
During orthostatic vital signs patient seemed unsteady but stated that it was not more than usual.

## 2018-12-03 NOTE — ED Provider Notes (Addendum)
[facility] EMERGENCY DEPARTMENT Provider Note   CSN: [id number] Arrival date & time: 12/03/18  1303    History   Chief Complaint Chief Complaint  Patient presents with  . Loss of Consciousness    HPI Linda Robinson is a 85 y.o. female.     HPI Pt has history of feinting spells.  She states she has had this her whole life.  Today, she was at the beauty shop.  She has been having some trouble with pain in her neck, she has had prior back and neck surgery.  When she turns her head to the left it gets worsee. While sitting she started to have more pain.    She started to feel lightheaded.  She is not sure she lost consciousness but the hair stylist called her name and she couldn't answer.   Per fvamily, they were told she was shaking a bit.  It lasted maybe a minyute or so. Past Medical History:  Diagnosis Date  . Basal cell carcinoma    removed  . Chest pain    a. 02/2017 MV: EF 54%, small, distal anterior wall/apical infacrt (likely misregistration), no ischemia-->Low risk; b. 02/2017 Echo: EF 55-60%, mild MR, PASP 23mmHg.  Marland Kitchen Chronic back pain    stenosis  . Constipation    takes Colace daily  . Depression    takes Zoloft daily  . Diverticulosis of colon   . GERD (gastroesophageal reflux disease)    atypical chest pain  . Gout    hx of;was on Allopurinol but taken off 6 wks ago by medical MD  . History of diverticulitis of colon   . HLD (hyperlipidemia)    takes Pravastatin daily  . HTN (hypertension)    takes Amlodipine and carvedilol  . OA (osteoarthritis)    right knee  . Osteopenia    takes Calcium and Vit D daily  . Vertigo    takes Antivert daily as needed    Patient Active Problem List   Diagnosis Date Noted  . Hearing loss of right ear 10/17/2018  . Acute sinusitis 09/20/2018  . Contusion of face 09/20/2018  . Screening mammogram, encounter for 05/23/2018  . Substernal chest pain 08/16/2017  . Atherosclerosis of aorta ([place]) 03/09/2017    . Weight loss 02/23/2017  . Routine general medical examination at a health care facility 07/05/2015  . Estrogen deficiency 07/05/2015  . Hyperglycemia 01/01/2015  . Lumbar spinal stenosis 11/13/2014  . Vertigo 10/05/2014  . Risk for falls 06/27/2014  . Low back pain on right side with sciatica 07/24/2013  . Edema 04/26/2013  . Encounter for Medicare annual wellness exam 04/11/2013  . GERD (gastroesophageal reflux disease)   . Bradycardia 05/19/2010  . Disorder of bone and cartilage 01/30/2009  . CONSTIPATION 11/01/2007  . Depression with anxiety 02/14/2007  . Hyperlipidemia LDL goal <130 02/04/2007  . Gout 02/04/2007  . Essential hypertension 02/04/2007  . DIVERTICULOSIS, COLON 02/04/2007  . Renal insufficiency 02/04/2007  . OSTEOARTHRITIS 02/04/2007  . [place] DISEASE 02/04/2007    Past Surgical History:  Procedure Laterality Date  . APPENDECTOMY  at age 66  . BACK SURGERY    . CATARACT EXTRACTION Right 04/2006  . CHOLECYSTECTOMY    . COLONOSCOPY    . KNEE SURGERY Right    Medial meniscus tear  . LUMBAR LAMINECTOMY/DECOMPRESSION MICRODISCECTOMY Bilateral 11/13/2014   Procedure: Lumbar one-two Bilateral laminectomy and foraminotomy, Left Lumbar one-two microdiscectomy ;  Surgeon: Faythe Ghee, MD;  Location:  [facility];  Service: Neurosurgery;  Laterality: Bilateral;  . NECK SURGERY     x 2;fusion  . PARTIAL COLECTOMY  09/1999   d/t diverticulitis  . TUBAL LIGATION       OB History   No obstetric history on file.      Home Medications    Prior to Admission medications   Medication Sig Start Date End Date Taking? Authorizing Provider  amLODipine (NORVASC) 10 MG tablet Take 1 tablet (10 mg total) by mouth daily. 05/23/18   Tower, Wynelle Fanny, MD  aspirin EC 81 MG tablet Take 1 tablet (81 mg total) by mouth daily. 03/01/17   Wellington Hampshire, MD  Calcium Carbonate-Vitamin D (CALCIUM 500 + D) 500-125 MG-UNIT TABS Take by mouth.    [provider]   Cranberry 250 MG CAPS Take 1 capsule by mouth daily.      [provider]  docusate sodium (COLACE) 100 MG capsule Take 100-200 mg by mouth 2 (two) times daily. 100 mg in the morning and 200 mg at night    [provider]  fish oil-omega-3 fatty acids 1000 MG capsule Take 1 g by mouth at bedtime.     [provider]  furosemide (LASIX) 20 MG tablet TAKE 1 TABLET DAILY AS NEEDED FOR SWELLING 05/23/18   Tower, Wynelle Fanny, MD  hydrALAZINE (APRESOLINE) 10 MG tablet Take 1 tablet (10 mg total) by mouth daily. 05/23/18   Tower, Wynelle Fanny, MD  meclizine (ANTIVERT) 25 MG tablet Take 25 mg by mouth 4 (four) times daily as needed for dizziness.     [provider]  omeprazole (PRILOSEC) 20 MG capsule Take 1 capsule (20 mg total) by mouth daily. 05/23/18   Tower, Wynelle Fanny, MD  pravastatin (PRAVACHOL) 20 MG tablet Take 1 tablet (20 mg total) by mouth daily. 05/23/18   Tower, Wynelle Fanny, MD  predniSONE (DELTASONE) 20 MG tablet 2 tabs po for 5 days, then 1 tab po for 5 days 10/12/18   Copland, Frederico Hamman, MD  ranitidine (ZANTAC) 150 MG capsule Take 1 capsule (150 mg total) by mouth every evening. 05/23/18   Tower, Wynelle Fanny, MD  sertraline (ZOLOFT) 100 MG tablet Take 1 tablet (100 mg total) by mouth daily. 05/23/18   Tower, Wynelle Fanny, MD  Tetrahydrozoline HCl (EYE DROPS OP) Apply 1-2 drops to eye as needed (for dry eye).    [provider]  tizanidine (ZANAFLEX) 2 MG capsule Take 1 capsule (2 mg total) by mouth at bedtime as needed for muscle spasms. 10/12/18   Owens Loffler, MD    Family History Family History  Problem Relation Age of Onset  . Cancer Father        throat and bladder  . Diabetes Father   . Hypertension Father   . Heart attack Father   . Stroke Mother   . Alzheimer's disease Mother   . Hypertension Mother     Social History Social History   Tobacco Use  . Smoking status: Never Smoker  . Smokeless tobacco: Never Used  Substance Use Topics  . Alcohol use:  No    Alcohol/week: 0.0 standard drinks  . Drug use: No     Allergies   Ace inhibitors; Cephalosporins; Colesevelam; Hctz [hydrochlorothiazide]; Losartan; Lovastatin; Raloxifene; Rosuvastatin; and Simvastatin   Review of Systems Review of Systems   Physical Exam Updated Vital Signs BP (!) 158/77   Pulse 63   Temp 97.8 F (36.6 C) (Oral)   Resp Marland Kitchen)  21   Ht 1.588 m (5' 2.5")   Wt 64.9 kg   SpO2 100%   BMI 25.74 kg/m   Physical Exam Vitals signs and nursing note reviewed.  Constitutional:      General: She is not in acute distress.    Appearance: She is well-developed.  HENT:     Head: Normocephalic and atraumatic.     Right Ear: External ear normal.     Left Ear: External ear normal.  Eyes:     General: No scleral icterus.       Right eye: No discharge.        Left eye: No discharge.     Conjunctiva/sclera: Conjunctivae normal.  Neck:     Musculoskeletal: Neck supple.     Trachea: No tracheal deviation.  Cardiovascular:     Rate and Rhythm: Normal rate and regular rhythm.  Pulmonary:     Effort: Pulmonary effort is normal. No respiratory distress.     Breath sounds: Normal breath sounds. No stridor. No wheezing or rales.  Abdominal:     General: Bowel sounds are normal. There is no distension.     Palpations: Abdomen is soft.     Tenderness: There is no abdominal tenderness. There is no guarding or rebound.  Musculoskeletal:        General: No tenderness.  Skin:    General: Skin is warm and dry.     Findings: No rash.  Neurological:     Mental Status: She is alert.     Cranial Nerves: No cranial nerve deficit (no facial droop, extraocular movements intact, no slurred speech).     Sensory: No sensory deficit.     Motor: No abnormal muscle tone or seizure activity.     Coordination: Coordination normal.      ED Treatments / Results  Labs (all labs ordered are listed, but only abnormal results are displayed) Labs Reviewed  CBC - Abnormal; Notable for  the following components:      Result Value   WBC 11.2 (*)    All other components within normal limits  BASIC METABOLIC PANEL - Abnormal; Notable for the following components:   Glucose, Bld 122 (*)    Creatinine, Ser 1.20 (*)    GFR calc non Af Amer 41 (*)    GFR calc Af Amer 47 (*)    All other components within normal limits  I-STAT TROPONIN, ED    EKG EKG Interpretation  Date/Time:  Saturday December 03 2018 13:08:59 EST Ventricular Rate:  66 PR Interval:    QRS Duration: 89 QT Interval:  422 QTC Calculation: 443 R Axis:   40 Text Interpretation:  Sinus rhythm Consider anterior infarct No significant change since last tracing Confirmed by Dorie Rank [phone]) on 12/03/2018 3:01:36 PM   Radiology No results found.  Procedures Procedures (including critical care time)  Medications Ordered in ED Medications  sodium chloride 0.9 % bolus 500 mL (0 mLs Intravenous Stopped 12/03/18 1406)  acetaminophen (TYLENOL) tablet 650 mg (650 mg Oral Given 12/03/18 1408)     Initial Impression / Assessment and Plan / ED Course  I have reviewed the triage vital signs and the nursing notes.  Pertinent labs & imaging results that were available during my care of the patient were reviewed by me and considered in my medical decision making (see chart for details).   Patient presented to the ED for evaluation after a syncopal spell at the hairdresser.  Patient states she has a  history syncopal episodes all throughout her life.  She has been having some neck discomfort recently and was having some pain in her neck before this started.  Patient symptoms had completely resolved by the time she arrived in the ED.  She felt well and had no complaints.  She was not having trouble with chest pain or shortness of breath.  No focal neurologic deficits.  Doubt stroke, TIA, acute coronary syndrome, pulmonary embolism or aortic dissection.  Patient was monitored in the emergency room.  No dysrhythmia noted to  monitor.  Patient's ED work-up is reassuring.  She was able to ambulate without difficulty.  Suspect vasovagal episode.  Patient feels comfortable going home.  Warning signs precautions discussed. Final Clinical Impressions(s) / ED Diagnoses   Final diagnoses:  Syncope, unspecified syncope type    ED Discharge Orders    None       Dorie Rank, MD 12/04/18 1136    Dorie Rank, MD 12/13/18 0000

## 2018-12-03 NOTE — ED Triage Notes (Signed)
Pt arrives via EMS with reports of a syncopal episode while getting her hair done. Pt has hx of syncopal episodes due to vertigo. Pt states she did feel dizzy before she passed out. Pt did not fall or hit head.

## 2018-12-03 NOTE — ED Notes (Signed)
Pt ambulated steady gait no complaints.

## 2018-12-03 NOTE — Discharge Instructions (Addendum)
Follow-up with your primary care doctor, return to the emergency room for worsening or recurrent symptoms

## 2018-12-07 ENCOUNTER — Other Ambulatory Visit: Payer: Self-pay

## 2018-12-07 ENCOUNTER — Ambulatory Visit
Admission: RE | Admit: 2018-12-07 | Discharge: 2018-12-07 | Disposition: A | Discharge status: Home / Self Care | Payer: Medicare Other | Source: Ambulatory Visit | Attending: Otolaryngology | Admitting: Otolaryngology

## 2018-12-07 DIAGNOSIS — H90A21 Sensorineural hearing loss, unilateral, right ear, with restricted hearing on the contralateral side: Secondary | ICD-10-CM | POA: Insufficient documentation

## 2018-12-07 LAB — POCT I-STAT CREATININE: Creatinine, Ser: 1.1 mg/dL — ABNORMAL HIGH (ref 0.44–1.00)

## 2018-12-07 MED ORDER — GADOBUTROL 1 MMOL/ML IV SOLN
6.0000 mL | Freq: Once | INTRAVENOUS | Status: AC | PRN
  Administered 2018-12-07: 6 mL via INTRAVENOUS

## 2018-12-08 ENCOUNTER — Ambulatory Visit (INDEPENDENT_AMBULATORY_CARE_PROVIDER_SITE_OTHER): Payer: Medicare Other | Admitting: Family Medicine

## 2018-12-08 ENCOUNTER — Encounter: Payer: Self-pay | Admitting: Family Medicine

## 2018-12-08 ENCOUNTER — Other Ambulatory Visit: Payer: Self-pay

## 2018-12-08 VITALS — BP 128/68 | HR 76 | Temp 98.8°F | Ht 62.5 in | Wt 143.0 lb

## 2018-12-08 DIAGNOSIS — M961 Postlaminectomy syndrome, not elsewhere classified: Secondary | ICD-10-CM

## 2018-12-08 DIAGNOSIS — M5412 Radiculopathy, cervical region: Secondary | ICD-10-CM | POA: Diagnosis not present

## 2018-12-08 MED ORDER — PREDNISONE 20 MG PO TABS: ORAL_TABLET | ORAL | 0 refills | Status: DC

## 2018-12-08 NOTE — Progress Notes (Signed)
Dr. Frederico Hamman T. Desiree Daise, MD, [facility] [place], [address] Phone: [phone] Fax: [phone]  12/08/2018  Patient: Linda Robinson, MRN: [id number], DOB: 05/04/1929, 85 y.o.  Primary Physician:  Tower, Wynelle Fanny, MD   Chief Complaint  Patient presents with  . neck and arm pain    left, started about a month ago   Subjective:   Linda Robinson is a 85 y.o. very pleasant female patient who presents with the following:  S/p cervical fusion x 2 in the past. Some pain in the shoulder blade and some numbness and tingling.  Had some bad pain and pain all down the left arm.    Positional change does help it.  No real weakness.  Doing therapy - Malon Kindle. For vertigo.  Vestibular rehab and doing some rotational movements with this.  Radial hand and forearm  Past Medical History, Surgical History, Social History, Family History, Problem List, Medications, and Allergies have been reviewed and updated if relevant.  Patient Active Problem List   Diagnosis Date Noted  . Hearing loss of right ear 10/17/2018  . Acute sinusitis 09/20/2018  . Contusion of face 09/20/2018  . Screening mammogram, encounter for 05/23/2018  . Substernal chest pain 08/16/2017  . Atherosclerosis of aorta ([place]) 03/09/2017  . Weight loss 02/23/2017  . Routine general medical examination at a health care facility 07/05/2015  . Estrogen deficiency 07/05/2015  . Hyperglycemia 01/01/2015  . Lumbar spinal stenosis 11/13/2014  . Vertigo 10/05/2014  . Risk for falls 06/27/2014  . Low back pain on right side with sciatica 07/24/2013  . Edema 04/26/2013  . Encounter for Medicare annual wellness exam 04/11/2013  . GERD (gastroesophageal reflux disease)   . Bradycardia 05/19/2010  . Disorder of bone and cartilage 01/30/2009  . CONSTIPATION 11/01/2007  . Depression with anxiety 02/14/2007  . Hyperlipidemia LDL goal <130 02/04/2007  . Gout 02/04/2007   . Essential hypertension 02/04/2007  . DIVERTICULOSIS, COLON 02/04/2007  . Renal insufficiency 02/04/2007  . OSTEOARTHRITIS 02/04/2007  . [place] DISEASE 02/04/2007    Past Medical History:  Diagnosis Date  . Basal cell carcinoma    removed  . Chest pain    a. 02/2017 MV: EF 54%, small, distal anterior wall/apical infacrt (likely misregistration), no ischemia-->Low risk; b. 02/2017 Echo: EF 55-60%, mild MR, PASP 76mmHg.  Marland Kitchen Chronic back pain    stenosis  . Constipation    takes Colace daily  . Depression    takes Zoloft daily  . Diverticulosis of colon   . GERD (gastroesophageal reflux disease)    atypical chest pain  . Gout    hx of;was on Allopurinol but taken off 6 wks ago by medical MD  . History of diverticulitis of colon   . HLD (hyperlipidemia)    takes Pravastatin daily  . HTN (hypertension)    takes Amlodipine and carvedilol  . OA (osteoarthritis)    right knee  . Osteopenia    takes Calcium and Vit D daily  . Vertigo    takes Antivert daily as needed    Past Surgical History:  Procedure Laterality Date  . APPENDECTOMY  at age 46  . BACK SURGERY    . CATARACT EXTRACTION Right 04/2006  . CHOLECYSTECTOMY    . COLONOSCOPY    . KNEE SURGERY Right    Medial meniscus tear  . LUMBAR LAMINECTOMY/DECOMPRESSION MICRODISCECTOMY Bilateral 11/13/2014   Procedure: Lumbar one-two Bilateral laminectomy and  foraminotomy, Left Lumbar one-two microdiscectomy ;  Surgeon: Faythe Ghee, MD;  Location: [facility];  Service: Neurosurgery;  Laterality: Bilateral;  . NECK SURGERY     x 2;fusion  . PARTIAL COLECTOMY  09/1999   d/t diverticulitis  . TUBAL LIGATION      Social History   Socioeconomic History  . Marital status: Married    Spouse name: Not on file  . Number of children: Not on file  . Years of education: Not on file  . Highest education level: Not on file  Occupational History  . Occupation: Retired  Scientific laboratory technician  . Financial resource strain: Not  on file  . Food insecurity:    Worry: Not on file    Inability: Not on file  . Transportation needs:    Medical: Not on file    Non-medical: Not on file  Tobacco Use  . Smoking status: Never Smoker  . Smokeless tobacco: Never Used  Substance and Sexual Activity  . Alcohol use: No    Alcohol/week: 0.0 standard drinks  . Drug use: No  . Sexual activity: Never    Birth control/protection: Post-menopausal  Lifestyle  . Physical activity:    Days per week: Not on file    Minutes per session: Not on file  . Stress: Not on file  Relationships  . Social connections:    Talks on phone: Not on file    Gets together: Not on file    Attends religious service: Not on file    Active member of club or organization: Not on file    Attends meetings of clubs or organizations: Not on file    Relationship status: Not on file  . Intimate partner violence:    Fear of current or ex partner: Not on file    Emotionally abused: Not on file    Physically abused: Not on file    Forced sexual activity: Not on file  Other Topics Concern  . Not on file  Social History Narrative  . Not on file    Family History  Problem Relation Age of Onset  . Cancer Father        throat and bladder  . Diabetes Father   . Hypertension Father   . Heart attack Father   . Stroke Mother   . Alzheimer's disease Mother   . Hypertension Mother     Allergies  Allergen Reactions  . Ace Inhibitors     REACTION: increased K+, increased creat.  . Cephalosporins     REACTION: reaction not known  . Colesevelam     REACTION: constipation  . Hctz [Hydrochlorothiazide]     Makes her feel "bad"  . Losartan     Increased K  . Lovastatin     REACTION: doesn't work  . Raloxifene     REACTION: cramps  . Rosuvastatin     REACTION: myalgia  . Simvastatin     REACTION: myalgia    Medication list reviewed and updated in full in [facility].  GEN: no acute illness or fever CV: No chest pain or shortness of  breath MSK: detailed above Neuro: neurological signs are described above ROS O/w per HPI  Objective:   BP 128/68   Pulse 76   Temp 98.8 F (37.1 C) (Oral)   Ht 5' 2.5" (1.588 m)   Wt 143 lb (64.9 kg)   SpO2 96%   BMI 25.74 kg/m    GEN: Well-developed,well-nourished,in no acute distress; alert,appropriate  and cooperative throughout examination HEENT: Normocephalic and atraumatic without obvious abnormalities. Ears, externally no deformities PULM: Breathing comfortably in no respiratory distress EXT: No clubbing, cyanosis, or edema PSYCH: Normally interactive. Cooperative during the interview. Pleasant. Friendly and conversant. Not anxious or depressed appearing. Normal, full affect.  CERVICAL SPINE EXAM Range of motion: Flexion, extension, lateral bending, and rotation: 25% loss of flexion and ext and 65% loss of rotational movements Pain with terminal motion: yes Spinous Processes: NT SCM: NT Upper paracervical muscles: ttp Upper traps: NT Motor function intact C5-t1, decreased sensation on dorsal forearm and hand  Radiology:  Assessment and Plan:   Cervical radiculopathy  Failed neck syndrome  More challenging with prior fusions of c-spine  L radicular sx and numbness Hopefully burst of pred and zanaflex will help Hold therapy x 10-14 d  Dr. Patrice Paradise is her spine surgeon  Follow-up: No follow-ups on file.  Meds ordered this encounter  Medications  . predniSONE (DELTASONE) 20 MG tablet    Sig: 2 tabs po daily for 5 days, then 1 tab po daily for 5 days    Dispense:  15 tablet    Refill:  0   Signed,  Jaice Lague T. Thersa Mohiuddin, MD   Outpatient Encounter Medications as of 12/08/2018  Medication Sig  . amLODipine (NORVASC) 10 MG tablet Take 1 tablet (10 mg total) by mouth daily.  Marland Kitchen aspirin EC 81 MG tablet Take 1 tablet (81 mg total) by mouth daily.  . Calcium Carbonate-Vitamin D (CALCIUM 500 + D) 500-125 MG-UNIT TABS Take by mouth.  . Cranberry 250 MG CAPS Take 1  capsule by mouth daily.    Marland Kitchen docusate sodium (COLACE) 100 MG capsule Take 100-200 mg by mouth 2 (two) times daily. 100 mg in the morning and 200 mg at night  . fish oil-omega-3 fatty acids 1000 MG capsule Take 1 g by mouth at bedtime.   . furosemide (LASIX) 20 MG tablet TAKE 1 TABLET DAILY AS NEEDED FOR SWELLING  . hydrALAZINE (APRESOLINE) 10 MG tablet Take 1 tablet (10 mg total) by mouth daily.  . meclizine (ANTIVERT) 25 MG tablet Take 25 mg by mouth 4 (four) times daily as needed for dizziness.   Marland Kitchen omeprazole (PRILOSEC) 20 MG capsule Take 1 capsule (20 mg total) by mouth daily.  . pravastatin (PRAVACHOL) 20 MG tablet Take 1 tablet (20 mg total) by mouth daily.  . predniSONE (DELTASONE) 20 MG tablet 2 tabs po daily for 5 days, then 1 tab po daily for 5 days  . ranitidine (ZANTAC) 150 MG capsule Take 1 capsule (150 mg total) by mouth every evening.  . sertraline (ZOLOFT) 100 MG tablet Take 1 tablet (100 mg total) by mouth daily.  . Tetrahydrozoline HCl (EYE DROPS OP) Apply 1-2 drops to eye as needed (for dry eye).  . tizanidine (ZANAFLEX) 2 MG capsule Take 1 capsule (2 mg total) by mouth at bedtime as needed for muscle spasms.  . [DISCONTINUED] predniSONE (DELTASONE) 20 MG tablet 2 tabs po for 5 days, then 1 tab po for 5 days (Patient not taking: Reported on 12/08/2018)   No facility-administered encounter medications on file as of 12/08/2018.

## 2018-12-08 NOTE — Patient Instructions (Signed)
tizanadine tablets - They might make sleepy, so only take before bed.

## 2019-01-09 ENCOUNTER — Telehealth: Payer: Self-pay | Admitting: *Deleted

## 2019-01-09 MED ORDER — FAMOTIDINE 20 MG PO TABS: 20.0000 mg | ORAL_TABLET | Freq: Two times a day (BID) | ORAL | 3 refills | Status: DC

## 2019-01-09 NOTE — Telephone Encounter (Signed)
Sub pepcid 20 mg bid for the ranitidine   It is pended[facility]   Alert [place] if not effective  Follow up as specified if uti symptoms continue -cannot tx w/o visit

## 2019-01-09 NOTE — Telephone Encounter (Signed)
Rx sent to [facility]. Called # given in message and no answer and no VM set up. Called home # on file twice and it was busy both times

## 2019-01-09 NOTE — Telephone Encounter (Addendum)
Patient called stating that they do not have any power. Patient stated that she received a letter from her insurance company that ranitidine is no longer on the market. Patient stated that she needs something else sent in to replace it Pharmacy for Ranitidine substitue [facility]  Patient stated that she thinks that she has another UTI because she is burning with urination. Offered patient a phone visit and drop off a urine. Patient declined stating that she will wait and see how she does over the next few days. Patient wanted to know if Dr. Glori Bickers would just send an antibiotic in for her to [facility]

## 2019-01-10 NOTE — Telephone Encounter (Signed)
Pt notified Rx sent to pharmacy and advised of Dr. Marliss Coots comments. Pt will work on finding something to sterilized to bring urine in before scheduling an appt. If she finds something or if she finds someone to stop by and grab a urine cup she will call back and schedule virtual visit with Dr. Glori Bickers

## 2019-01-25 ENCOUNTER — Ambulatory Visit (INDEPENDENT_AMBULATORY_CARE_PROVIDER_SITE_OTHER): Payer: Medicare Other | Admitting: Family Medicine

## 2019-01-25 ENCOUNTER — Encounter: Payer: Self-pay | Admitting: Family Medicine

## 2019-01-25 ENCOUNTER — Other Ambulatory Visit: Payer: Self-pay

## 2019-01-25 DIAGNOSIS — R300 Dysuria: Secondary | ICD-10-CM | POA: Diagnosis not present

## 2019-01-25 DIAGNOSIS — N3000 Acute cystitis without hematuria: Secondary | ICD-10-CM

## 2019-01-25 DIAGNOSIS — N390 Urinary tract infection, site not specified: Secondary | ICD-10-CM | POA: Insufficient documentation

## 2019-01-25 DIAGNOSIS — I700 Atherosclerosis of aorta: Secondary | ICD-10-CM | POA: Diagnosis not present

## 2019-01-25 LAB — POC URINALSYSI DIPSTICK (AUTOMATED)
Bilirubin, UA: NEGATIVE
Blood, UA: NEGATIVE
Glucose, UA: NEGATIVE
Ketones, UA: NEGATIVE
Nitrite, UA: POSITIVE
Protein, UA: NEGATIVE
Spec Grav, UA: 1.01 (ref 1.010–1.025)
Urobilinogen, UA: 1 E.U./dL
pH, UA: 6 (ref 5.0–8.0)

## 2019-01-25 MED ORDER — SULFAMETHOXAZOLE-TRIMETHOPRIM 800-160 MG PO TABS: 1.0000 | ORAL_TABLET | Freq: Two times a day (BID) | ORAL | 0 refills | Status: DC

## 2019-01-25 NOTE — Assessment & Plan Note (Signed)
With pos ua  Symptoms were helped with azo at home  Enc good water intake Px bactrim ds 1 po bid for 7d  Culture pending Update if not starting to improve in a week or if worsening  -especially if worse pain or any fever/nausea or flank pain

## 2019-01-25 NOTE — Progress Notes (Signed)
Virtual Visit via Telephone Note  I connected with Linda Robinson on 01/25/19 at  3:30 PM EDT by telephone and verified that I am speaking with the correct person using two identifiers. The patient is at home today  I am in my office at [place]   I discussed the limitations, risks, security and privacy concerns of performing an evaluation and management service by telephone and the availability of in person appointments. I also discussed with the patient that there may be a patient responsible charge related to this service. The patient expressed understanding and agreed to proceed.   History of Present Illness: Patient presents with urinary symptoms  Going on (starting 2 wk ago)  Got better initially with azo and then came back   She has pain over the bottom of her belly (over bladder)  Dull pain  Urgency to urinate and frequency  Thinks she has a uti  No hematuria  Some burning at the end of urination  No fever  No nausea  Feels fatigued/lousy in general   No flank pain    UA today Results for orders placed or performed in visit on 01/25/19  POCT Urinalysis Dipstick (Automated)  Result Value Ref Range   Color, UA gold    Clarity, UA clear    Glucose, UA Negative Negative   Bilirubin, UA negative    Ketones, UA negative    Spec Grav, UA 1.010 1.010 - 1.025   Blood, UA negative    pH, UA 6.0 5.0 - 8.0   Protein, UA Negative Negative   Urobilinogen, UA 1.0 0.2 or 1.0 E.U./dL   Nitrite, UA positive    Leukocytes, UA Moderate (2+) (A) Negative     Pt has a h/o renal insuff Lab Results  Component Value Date   CREATININE 1.10 (H) 12/07/2018   BUN 22 12/03/2018   NA 138 12/03/2018   K 3.8 12/03/2018   CL 104 12/03/2018   CO2 26 12/03/2018   Cannot take cephalosporins   Review of Systems  Constitutional: Positive for malaise/fatigue. Negative for chills, diaphoresis and fever.  Respiratory: Negative for cough and shortness of breath.   Cardiovascular:  Negative for chest pain, palpitations and leg swelling.  Gastrointestinal: Negative for diarrhea, heartburn, nausea and vomiting.  Genitourinary: Positive for dysuria, frequency and urgency. Negative for flank pain and hematuria.  Skin: Negative for itching and rash.  Neurological: Negative for dizziness and headaches.    Patient Active Problem List   Diagnosis Date Noted  . UTI (urinary tract infection) 01/25/2019  . Hearing loss of right ear 10/17/2018  . Contusion of face 09/20/2018  . Screening mammogram, encounter for 05/23/2018  . Substernal chest pain 08/16/2017  . Atherosclerosis of aorta ([place]) 03/09/2017  . Weight loss 02/23/2017  . Routine general medical examination at a health care facility 07/05/2015  . Estrogen deficiency 07/05/2015  . Hyperglycemia 01/01/2015  . Lumbar spinal stenosis 11/13/2014  . Vertigo 10/05/2014  . Risk for falls 06/27/2014  . Low back pain on right side with sciatica 07/24/2013  . Edema 04/26/2013  . Encounter for Medicare annual wellness exam 04/11/2013  . GERD (gastroesophageal reflux disease)   . Bradycardia 05/19/2010  . Disorder of bone and cartilage 01/30/2009  . CONSTIPATION 11/01/2007  . Depression with anxiety 02/14/2007  . Hyperlipidemia LDL goal <130 02/04/2007  . Gout 02/04/2007  . Essential hypertension 02/04/2007  . DIVERTICULOSIS, COLON 02/04/2007  . Renal insufficiency 02/04/2007  . OSTEOARTHRITIS 02/04/2007  . DEGENERATIVE  [place] DISEASE 02/04/2007   Past Medical History:  Diagnosis Date  . Basal cell carcinoma    removed  . Chest pain    a. 02/2017 MV: EF 54%, small, distal anterior wall/apical infacrt (likely misregistration), no ischemia-->Low risk; b. 02/2017 Echo: EF 55-60%, mild MR, PASP 29mmHg.  Marland Kitchen Chronic back pain    stenosis  . Constipation    takes Colace daily  . Depression    takes Zoloft daily  . Diverticulosis of colon   . GERD (gastroesophageal reflux disease)    atypical chest pain  . Gout    hx  of;was on Allopurinol but taken off 6 wks ago by medical MD  . History of diverticulitis of colon   . HLD (hyperlipidemia)    takes Pravastatin daily  . HTN (hypertension)    takes Amlodipine and carvedilol  . OA (osteoarthritis)    right knee  . Osteopenia    takes Calcium and Vit D daily  . Vertigo    takes Antivert daily as needed   Past Surgical History:  Procedure Laterality Date  . APPENDECTOMY  at age 27  . BACK SURGERY    . CATARACT EXTRACTION Right 04/2006  . CHOLECYSTECTOMY    . COLONOSCOPY    . KNEE SURGERY Right    Medial meniscus tear  . LUMBAR LAMINECTOMY/DECOMPRESSION MICRODISCECTOMY Bilateral 11/13/2014   Procedure: Lumbar one-two Bilateral laminectomy and foraminotomy, Left Lumbar one-two microdiscectomy ;  Surgeon: Faythe Ghee, MD;  Location: [facility];  Service: Neurosurgery;  Laterality: Bilateral;  . NECK SURGERY     x 2;fusion  . PARTIAL COLECTOMY  09/1999   d/t diverticulitis  . TUBAL LIGATION     Social History   Tobacco Use  . Smoking status: Never Smoker  . Smokeless tobacco: Never Used  Substance Use Topics  . Alcohol use: No    Alcohol/week: 0.0 standard drinks  . Drug use: No   Family History  Problem Relation Age of Onset  . Cancer Father        throat and bladder  . Diabetes Father   . Hypertension Father   . Heart attack Father   . Stroke Mother   . Alzheimer's disease Mother   . Hypertension Mother    Allergies  Allergen Reactions  . Ace Inhibitors     REACTION: increased K+, increased creat.  . Cephalosporins     REACTION: reaction not known  . Colesevelam     REACTION: constipation  . Hctz [Hydrochlorothiazide]     Makes her feel "bad"  . Losartan     Increased K  . Lovastatin     REACTION: doesn't work  . Raloxifene     REACTION: cramps  . Rosuvastatin     REACTION: myalgia  . Simvastatin     REACTION: myalgia   Current Outpatient Medications on File Prior to Visit  Medication Sig Dispense Refill  .  amLODipine (NORVASC) 10 MG tablet Take 1 tablet (10 mg total) by mouth daily. 90 tablet 3  . aspirin EC 81 MG tablet Take 1 tablet (81 mg total) by mouth daily. 90 tablet 3  . Calcium Carbonate-Vitamin D (CALCIUM 500 + D) 500-125 MG-UNIT TABS Take by mouth.    . Cranberry 250 MG CAPS Take 1 capsule by mouth daily.      Marland Kitchen docusate sodium (COLACE) 100 MG capsule Take 100-200 mg by mouth 2 (two) times daily. 100 mg in the morning and 200 mg at night    .  famotidine (PEPCID) 20 MG tablet Take 1 tablet (20 mg total) by mouth 2 (two) times daily. 180 tablet 3  . fish oil-omega-3 fatty acids 1000 MG capsule Take 1 g by mouth at bedtime.     . furosemide (LASIX) 20 MG tablet TAKE 1 TABLET DAILY AS NEEDED FOR SWELLING 90 tablet 3  . hydrALAZINE (APRESOLINE) 10 MG tablet Take 1 tablet (10 mg total) by mouth daily. 90 tablet 3  . meclizine (ANTIVERT) 25 MG tablet Take 25 mg by mouth 4 (four) times daily as needed for dizziness.     Marland Kitchen omeprazole (PRILOSEC) 20 MG capsule Take 1 capsule (20 mg total) by mouth daily. 90 capsule 3  . pravastatin (PRAVACHOL) 20 MG tablet Take 1 tablet (20 mg total) by mouth daily. 90 tablet 3  . predniSONE (DELTASONE) 20 MG tablet 2 tabs po daily for 5 days, then 1 tab po daily for 5 days 15 tablet 0  . ranitidine (ZANTAC) 150 MG capsule Take 1 capsule (150 mg total) by mouth every evening. 90 capsule 3  . Tetrahydrozoline HCl (EYE DROPS OP) Apply 1-2 drops to eye as needed (for dry eye).    . tizanidine (ZANAFLEX) 2 MG capsule Take 1 capsule (2 mg total) by mouth at bedtime as needed for muscle spasms. 30 capsule 0   No current facility-administered medications on file prior to visit.     Observations/Objective:   Assessment and Plan: Problem List Items Addressed This Visit      Genitourinary   UTI (urinary tract infection)    With pos ua  Symptoms were helped with azo at home  Enc good water intake Px bactrim ds 1 po bid for 7d  Culture pending Update if not  starting to improve in a week or if worsening  -especially if worse pain or any fever/nausea or flank pain        Relevant Medications   sulfamethoxazole-trimethoprim (BACTRIM DS) 800-160 MG tablet    Other Visit Diagnoses    Dysuria    -  Primary   Relevant Orders   POCT Urinalysis Dipstick (Automated) (Completed)   Urine Culture       Follow Up Instructions: Drink lots of fluids and take the bactrim DS as directed for uti  If symptoms worsen or you do not tolerate the antibiotic please let me know  Update if not starting to improve in a week or if worsening   We will alert you when the urine culture returns    I discussed the assessment and treatment plan with the patient. The patient was provided an opportunity to ask questions and all were answered. The patient agreed with the plan and demonstrated an understanding of the instructions.   The patient was advised to call back or seek an in-person evaluation if the symptoms worsen or if the condition fails to improve as anticipated.  I provided 16 minutes of non-face-to-face time during this encounter.   Loura Pardon, MD

## 2019-01-25 NOTE — Addendum Note (Signed)
Addended by: Ellamae Sia on: 01/25/2019 03:44 PM   Modules accepted: Orders

## 2019-01-25 NOTE — Patient Instructions (Addendum)
Drink lots of fluids and take the bactrim DS as directed for uti  If symptoms worsen or you do not tolerate the antibiotic please let me know  Update if not starting to improve in a week or if worsening   We will alert you when the urine culture returns

## 2019-01-25 NOTE — Assessment & Plan Note (Signed)
No problems or symptoms

## 2019-01-27 LAB — URINE CULTURE
MICRO NUMBER:: 432788
SPECIMEN QUALITY:: ADEQUATE

## 2019-01-30 ENCOUNTER — Telehealth: Payer: Self-pay | Admitting: *Deleted

## 2019-01-30 MED ORDER — CIPROFLOXACIN HCL 250 MG PO TABS: 250.0000 mg | ORAL_TABLET | Freq: Two times a day (BID) | ORAL | 0 refills | Status: DC

## 2019-01-30 NOTE — Telephone Encounter (Signed)
Patient called stating that Famotidine was replaced for the Ranitidine that she was taking. Patient stated that she has been taking  famotidine since 01/16/19 and thinks that it may be causing a rash.. Patient stated that she is being treated for a UTI and that is better. Patent stated that she is on Sulfa for a UTI and has one day left of the medication to take. Patient stated that she is having low belly pain and wants to know about stopping the Famotidine because she woke up this morning with a rash on her chest.  Patient stated that she has noticed occasionally some wheezing since starting the Sulfa, no shortness of breath and throat is not swollen. Patient stated that she is not currently wheezing.  Advised patient not to take anymore of the sulfa until she hears back from Dr. Glori Bickers.Patient stated that she usually takes Cipro for UTI's and that usually works well.   Pharmacy [facility]

## 2019-01-30 NOTE — Telephone Encounter (Signed)
Her reaction is much much more likely from the sulfa.  Stop it (she has been on it long enough to just stop) If uti symptoms return let me know.  We are trying to avoid cipro due to newly discovered side effects of tendon problems and mental status changes.  (we can use it -just no longer our front line medicine) Thanks  If rash does not improve please f/u

## 2019-01-30 NOTE — Telephone Encounter (Signed)
Patient notified as instructed by telephone and verbalized understanding. 

## 2019-01-30 NOTE — Telephone Encounter (Signed)
Patient notified as instructed by telephone and verbalized understanding. Patient stated that she knows the UTI is not completely gone because she is still having burning and lower abdominal pain. Pharmacy [facility]

## 2019-01-30 NOTE — Telephone Encounter (Signed)
I sent cipro If any muscle /joint /tendon pain let us know  Also if confusion or mental status change  She has muscle relaxer on med list-tizanadine-do not take that while on this medication Thanks

## 2019-02-09 ENCOUNTER — Telehealth: Payer: Self-pay | Admitting: Family Medicine

## 2019-02-09 NOTE — Telephone Encounter (Signed)
Copied from [place] [id number]. Topic: Quick Communication - Rx Refill/Question >> Feb 09, 2019  4:48 PM Mathis Bud wrote: Medication: ciprofloxacin (CIPRO) 250 MG tablet   Has the patient contacted their pharmacy? Yes, no more refills  Preferred Pharmacy (with phone number or street name): [id number] [place] [place] [phone] (Phone) [phone] (Fax)    Agent: Please be advised that RX refills may take up to 3 business days. We ask that you follow-up with your pharmacy.

## 2019-02-10 ENCOUNTER — Ambulatory Visit (INDEPENDENT_AMBULATORY_CARE_PROVIDER_SITE_OTHER): Payer: Medicare Other | Admitting: Family Medicine

## 2019-02-10 ENCOUNTER — Encounter: Payer: Self-pay | Admitting: Family Medicine

## 2019-02-10 ENCOUNTER — Other Ambulatory Visit: Payer: Medicare Other

## 2019-02-10 DIAGNOSIS — N3000 Acute cystitis without hematuria: Secondary | ICD-10-CM | POA: Diagnosis not present

## 2019-02-10 DIAGNOSIS — R300 Dysuria: Secondary | ICD-10-CM | POA: Diagnosis not present

## 2019-02-10 LAB — POC URINALSYSI DIPSTICK (AUTOMATED)
Bilirubin, UA: NEGATIVE
Blood, UA: NEGATIVE
Glucose, UA: NEGATIVE
Ketones, UA: NEGATIVE
Nitrite, UA: NEGATIVE
Protein, UA: NEGATIVE
Spec Grav, UA: 1.01 (ref 1.010–1.025)
Urobilinogen, UA: 0.2 E.U./dL
pH, UA: 6 (ref 5.0–8.0)

## 2019-02-10 MED ORDER — CIPROFLOXACIN HCL 250 MG PO TABS: 250.0000 mg | ORAL_TABLET | Freq: Two times a day (BID) | ORAL | 0 refills | Status: DC

## 2019-02-10 NOTE — Telephone Encounter (Signed)
Best number [phone] Pt returned your call

## 2019-02-10 NOTE — Telephone Encounter (Signed)
I spoke with pt who finished abx 3-4 days ago and continues with burning and pain upon urination with urgency. On back or abd pain.no covid symptoms and no travel or no known exposure to covid or flu. Pt scheduled phone visit today at 12 noon and will bring urine between 10-11 AM this morning.FYI to Dr Glori Bickers.

## 2019-02-10 NOTE — Patient Instructions (Signed)
Keep drinking lots of water  Minimize coffee and other beverages  Alert [place] if symptoms change or worsen or if you develop flank pain or fever or nausea  Take cipro as directed (this can cause side effect of tendon problems and anxiety-please call if any aches/pains/injuries or mood changes) We will call you when the urine culture returns and make a plan from there

## 2019-02-10 NOTE — Assessment & Plan Note (Signed)
S/p last uti-pt took bactrim and developed a rash (also not much improvement in symptoms) -we changed to cipro and she got better  After about 5 more days symptoms returned however (after complete resolution)  She is drinking water and some cranberry  Disc poss of an irritated bladder ua is improved but still trace of leukocytes  Will sent for cx tx with cipro (longer course)-pt states this often is the only thing that works Update if not starting to improve in a week or if worsening

## 2019-02-10 NOTE — Telephone Encounter (Signed)
I called pt and someone answered phone and then disconnected; I called again and left v/m requesting cb to [facility].

## 2019-02-10 NOTE — Telephone Encounter (Signed)
Copied from [place] [id number]. Topic: Appointment Scheduling - Scheduling Inquiry for Clinic >> Feb 09, 2019  4:49 PM Mathis Bud wrote: Reason for CRM: Patient called stating she wants to make a new appt due to here current UTI and maybe to discuss if she has anything more than UTI? She was prescribed ciprofloxacin (CIPRO) 250 MG tablet, and she still feels like she has the UTI. She wants to make sure she does not have anything further.  Patient did want to go ahead and ask for another medication refill for (CIPRO).  Patient call back number [phone]

## 2019-02-10 NOTE — Progress Notes (Signed)
Virtual Visit via Telephone Note  I connected with Linda Robinson on 02/10/19 at 12:00 PM EDT by telephone and verified that I am speaking with the correct person using two identifiers.  Location: Patient: home Provider: office    I discussed the limitations, risks, security and privacy concerns of performing an evaluation and management service by telephone and the availability of in person appointments. I also discussed with the patient that there may be a patient responsible charge related to this service. The patient expressed understanding and agreed to proceed.   History of Present Illness: Patient presents with urinary symptoms    She was tx for ut on 01/25/19 with bactrim ds She developed a rash and it was changed to cipro  Culture  That urine cx revealed e coli  Urine Culture (Order [id number])  Result Notes for Urine Culture   Notes recorded by Myriam Forehand, CMA on 01/27/2019 at 1:01 PM EDT Called and spoke with pt. Pt advised and voiced understanding. ------  Notes recorded by Abner Greenspan, MD on 01/27/2019 at 12:44 PM EDT Your urine culture is positive for infection  The sulfa antibiotic we send should work Please alert [place] if not improving  Contains abnormal data Urine Culture  Order: [id number]  Status:  Final result Visible to patient:  Yes (MyChart) Next appt:  05/26/2019 at 10:00 AM in Family Medicine Katha Cabal Pinson, LPN) Dx:  Acute cystitis without hematuria; Dys...  Specimen Information: Urine     Component 2wk ago  MICRO NUMBER: [id number]   SPECIMEN QUALITY: Adequate   Sample Source NOT GIVEN   STATUS: FINAL   ISOLATE 1: Escherichia coliAbnormal    Comment: 10,000-50,000 CFU/mL of Escherichia coli  Resulting Agency [facility]  Susceptibility    Escherichia coli    URINE CULTURE, REFLEX    AMOX/CLAVULANIC >=32  Resistant    AMPICILLIN >=32  Resistant    AMPICILLIN/SULBACTAM 16  Intermediate    CEFAZOLIN 8  Resistant1    CEFEPIME <=1  Sensitive     CEFTRIAXONE <=1  Sensitive    CIPROFLOXACIN <=0.25  Sensitive    ERTAPENEM <=0.5  Sensitive    GENTAMICIN <=1  Sensitive    IMIPENEM <=0.25  Sensitive    LEVOFLOXACIN <=0.12  Sensitive    NITROFURANTOIN <=16  Sensitive    PIP/TAZO <=4  Sensitive    TOBRAMYCIN <=1  Sensitive    TRIMETH/SULFA <=20  Sensitive2          She has h/o renal insuff Lab Results  Component Value Date   CREATININE 1.10 (H) 12/07/2018   BUN 22 12/03/2018   NA 138 12/03/2018   K 3.8 12/03/2018   CL 104 12/03/2018   CO2 26 12/03/2018   Pt states she was better in the first 2 d of cipro  Took 5 d supply  Finished it last fri (7 d ago) Symptoms started 2-3 d ago- did not go away   No side effects at all  No aches or pains   Pain over bladder with pressure  Burns to urinate  No blood in urine  No fever or nausea  Has urgency but not frequency  No odor  She is drinking lots of water  A little coffee and cran juice   No vaginal itching or d/c    UA this am: Results for orders placed or performed in visit on 02/10/19  POCT Urinalysis Dipstick (Automated)  Result Value Ref Range   Color, UA Light Yellow  Clarity, UA Clear    Glucose, UA Negative Negative   Bilirubin, UA Negative    Ketones, UA Negative    Spec Grav, UA 1.010 1.010 - 1.025   Blood, UA Negative    pH, UA 6.0 5.0 - 8.0   Protein, UA Negative Negative   Urobilinogen, UA 0.2 0.2 or 1.0 E.U./dL   Nitrite, UA Negative    Leukocytes, UA Trace (A) Negative   improved from last time (had 2 plus leuk)   Review of Systems  Constitutional: Negative for chills, diaphoresis, fever, malaise/fatigue and weight loss.  HENT: Negative for sore throat.   Eyes: Negative for blurred vision.  Respiratory: Negative for cough and shortness of breath.   Cardiovascular: Negative for chest pain, palpitations and leg swelling.  Gastrointestinal: Negative for nausea and vomiting.       Bladder pain/discomfort  Genitourinary: Positive for dysuria  and urgency. Negative for flank pain and hematuria.  Musculoskeletal: Negative for myalgias.  Skin: Negative for itching and rash.  Neurological: Negative for dizziness and headaches.  Endo/Heme/Allergies: Negative for polydipsia.    Patient Active Problem List   Diagnosis Date Noted  . UTI (urinary tract infection) 01/25/2019  . Hearing loss of right ear 10/17/2018  . Contusion of face 09/20/2018  . Screening mammogram, encounter for 05/23/2018  . Substernal chest pain 08/16/2017  . Atherosclerosis of aorta ([place]) 03/09/2017  . Weight loss 02/23/2017  . Routine general medical examination at a health care facility 07/05/2015  . Estrogen deficiency 07/05/2015  . Hyperglycemia 01/01/2015  . Lumbar spinal stenosis 11/13/2014  . Vertigo 10/05/2014  . Risk for falls 06/27/2014  . Low back pain on right side with sciatica 07/24/2013  . Edema 04/26/2013  . Encounter for Medicare annual wellness exam 04/11/2013  . GERD (gastroesophageal reflux disease)   . Bradycardia 05/19/2010  . Disorder of bone and cartilage 01/30/2009  . CONSTIPATION 11/01/2007  . Depression with anxiety 02/14/2007  . Hyperlipidemia LDL goal <130 02/04/2007  . Gout 02/04/2007  . Essential hypertension 02/04/2007  . DIVERTICULOSIS, COLON 02/04/2007  . Renal insufficiency 02/04/2007  . OSTEOARTHRITIS 02/04/2007  . [place] DISEASE 02/04/2007   Past Medical History:  Diagnosis Date  . Basal cell carcinoma    removed  . Chest pain    a. 02/2017 MV: EF 54%, small, distal anterior wall/apical infacrt (likely misregistration), no ischemia-->Low risk; b. 02/2017 Echo: EF 55-60%, mild MR, PASP 60mmHg.  Marland Kitchen Chronic back pain    stenosis  . Constipation    takes Colace daily  . Depression    takes Zoloft daily  . Diverticulosis of colon   . GERD (gastroesophageal reflux disease)    atypical chest pain  . Gout    hx of;was on Allopurinol but taken off 6 wks ago by medical MD  . History of diverticulitis of  colon   . HLD (hyperlipidemia)    takes Pravastatin daily  . HTN (hypertension)    takes Amlodipine and carvedilol  . OA (osteoarthritis)    right knee  . Osteopenia    takes Calcium and Vit D daily  . Vertigo    takes Antivert daily as needed   Past Surgical History:  Procedure Laterality Date  . APPENDECTOMY  at age 33  . BACK SURGERY    . CATARACT EXTRACTION Right 04/2006  . CHOLECYSTECTOMY    . COLONOSCOPY    . KNEE SURGERY Right    Medial meniscus tear  . LUMBAR LAMINECTOMY/DECOMPRESSION MICRODISCECTOMY Bilateral 11/13/2014  Procedure: Lumbar one-two Bilateral laminectomy and foraminotomy, Left Lumbar one-two microdiscectomy ;  Surgeon: Faythe Ghee, MD;  Location: [facility];  Service: Neurosurgery;  Laterality: Bilateral;  . NECK SURGERY     x 2;fusion  . PARTIAL COLECTOMY  09/1999   d/t diverticulitis  . TUBAL LIGATION     Social History   Tobacco Use  . Smoking status: Never Smoker  . Smokeless tobacco: Never Used  Substance Use Topics  . Alcohol use: No    Alcohol/week: 0.0 standard drinks  . Drug use: No   Family History  Problem Relation Age of Onset  . Cancer Father        throat and bladder  . Diabetes Father   . Hypertension Father   . Heart attack Father   . Stroke Mother   . Alzheimer's disease Mother   . Hypertension Mother    Allergies  Allergen Reactions  . Ace Inhibitors     REACTION: increased K+, increased creat.  . Bactrim [Sulfamethoxazole-Trimethoprim]     rash  . Cephalosporins     REACTION: reaction not known  . Colesevelam     REACTION: constipation  . Hctz [Hydrochlorothiazide]     Makes her feel "bad"  . Losartan     Increased K  . Lovastatin     REACTION: doesn't work  . Raloxifene     REACTION: cramps  . Rosuvastatin     REACTION: myalgia  . Simvastatin     REACTION: myalgia   Current Outpatient Medications on File Prior to Visit  Medication Sig Dispense Refill  . amLODipine (NORVASC) 10 MG tablet Take 1  tablet (10 mg total) by mouth daily. 90 tablet 3  . aspirin EC 81 MG tablet Take 1 tablet (81 mg total) by mouth daily. 90 tablet 3  . Calcium Carbonate-Vitamin D (CALCIUM 500 + D) 500-125 MG-UNIT TABS Take by mouth.    . Cranberry 250 MG CAPS Take 1 capsule by mouth daily.      Marland Kitchen docusate sodium (COLACE) 100 MG capsule Take 100-200 mg by mouth 2 (two) times daily. 100 mg in the morning and 200 mg at night    . famotidine (PEPCID) 20 MG tablet Take 1 tablet (20 mg total) by mouth 2 (two) times daily. 180 tablet 3  . fish oil-omega-3 fatty acids 1000 MG capsule Take 1 g by mouth at bedtime.     . furosemide (LASIX) 20 MG tablet TAKE 1 TABLET DAILY AS NEEDED FOR SWELLING 90 tablet 3  . hydrALAZINE (APRESOLINE) 10 MG tablet Take 1 tablet (10 mg total) by mouth daily. 90 tablet 3  . meclizine (ANTIVERT) 25 MG tablet Take 25 mg by mouth 4 (four) times daily as needed for dizziness.     Marland Kitchen omeprazole (PRILOSEC) 20 MG capsule Take 1 capsule (20 mg total) by mouth daily. 90 capsule 3  . pravastatin (PRAVACHOL) 20 MG tablet Take 1 tablet (20 mg total) by mouth daily. 90 tablet 3  . predniSONE (DELTASONE) 20 MG tablet 2 tabs po daily for 5 days, then 1 tab po daily for 5 days 15 tablet 0  . ranitidine (ZANTAC) 150 MG capsule Take 1 capsule (150 mg total) by mouth every evening. 90 capsule 3  . Tetrahydrozoline HCl (EYE DROPS OP) Apply 1-2 drops to eye as needed (for dry eye).    . tizanidine (ZANAFLEX) 2 MG capsule Take 1 capsule (2 mg total) by mouth at bedtime as needed for muscle spasms. [place]  capsule 0   No current facility-administered medications on file prior to visit.     Observations/Objective: Patient sounds well, in no distress Like her normal self  Normal voice-not hoarse and no slurred speech Mildly HOH-had to repeat myself occasionally but she voiced understanding  No cough or shortness of breath during interview  Talkative and mentally sharp with no cognitive changes Affect is normal     Assessment and Plan: Problem List Items Addressed This Visit      Genitourinary   UTI (urinary tract infection) - Primary    S/p last uti-pt took bactrim and developed a rash (also not much improvement in symptoms) -we changed to cipro and she got better  After about 5 more days symptoms returned however (after complete resolution)  She is drinking water and some cranberry  Disc poss of an irritated bladder ua is improved but still trace of leukocytes  Will sent for cx tx with cipro (longer course)-pt states this often is the only thing that works Update if not starting to improve in a week or if worsening        Relevant Orders   Urine Culture    Other Visit Diagnoses    Dysuria       Relevant Orders   POCT Urinalysis Dipstick (Automated) (Completed)       Follow Up Instructions: Keep drinking lots of water  Minimize coffee and other beverages  Alert [place] if symptoms change or worsen or if you develop flank pain or fever or nausea  Take cipro as directed (this can cause side effect of tendon problems and anxiety-please call if any aches/pains/injuries or mood changes) We will call you when the urine culture returns and make a plan from there    I discussed the assessment and treatment plan with the patient. The patient was provided an opportunity to ask questions and all were answered. The patient agreed with the plan and demonstrated an understanding of the instructions.   The patient was advised to call back or seek an in-person evaluation if the symptoms worsen or if the condition fails to improve as anticipated.     Loura Pardon, MD

## 2019-02-11 LAB — URINE CULTURE
MICRO NUMBER:: 478910
Result:: NO GROWTH
SPECIMEN QUALITY:: ADEQUATE

## 2019-04-04 ENCOUNTER — Other Ambulatory Visit: Payer: Self-pay | Admitting: *Deleted

## 2019-04-04 MED ORDER — TIZANIDINE HCL 2 MG PO CAPS: 2.0000 mg | ORAL_CAPSULE | Freq: Every evening | ORAL | 0 refills | Status: DC | PRN

## 2019-04-04 NOTE — Telephone Encounter (Signed)
Last office visit 02/10/2019 for acute cystitis.  Last refilled 10/12/2018 for #30 with no refills by Dr. Lorelei Pont.  Pharmacy is requesting 90 day supply.  CPE scheduled for 05/29/2019.

## 2019-05-18 ENCOUNTER — Other Ambulatory Visit: Payer: Self-pay | Admitting: Family Medicine

## 2019-05-18 NOTE — Telephone Encounter (Signed)
Please call her and see if she is taking it  Can refill both times 3

## 2019-05-18 NOTE — Telephone Encounter (Signed)
Zoloft not on med list anymore it said it was D/C in April, please advise

## 2019-05-18 NOTE — Telephone Encounter (Signed)
Pt is still taking med, meds refilled

## 2019-05-23 ENCOUNTER — Telehealth: Payer: Self-pay | Admitting: Family Medicine

## 2019-05-23 DIAGNOSIS — I10 Essential (primary) hypertension: Secondary | ICD-10-CM

## 2019-05-23 DIAGNOSIS — E785 Hyperlipidemia, unspecified: Secondary | ICD-10-CM

## 2019-05-23 DIAGNOSIS — R7303 Prediabetes: Secondary | ICD-10-CM

## 2019-05-23 DIAGNOSIS — M1A9XX0 Chronic gout, unspecified, without tophus (tophi): Secondary | ICD-10-CM

## 2019-05-23 DIAGNOSIS — N289 Disorder of kidney and ureter, unspecified: Secondary | ICD-10-CM

## 2019-05-23 NOTE — Telephone Encounter (Signed)
-----   Message from Ellamae Sia sent at 05/18/2019 11:26 AM EDT ----- Regarding: Lab orders for Wednesday, 8.26.20  AWV lab orders, please.

## 2019-05-25 ENCOUNTER — Other Ambulatory Visit (INDEPENDENT_AMBULATORY_CARE_PROVIDER_SITE_OTHER): Payer: Medicare Other

## 2019-05-25 DIAGNOSIS — N289 Disorder of kidney and ureter, unspecified: Secondary | ICD-10-CM | POA: Diagnosis not present

## 2019-05-25 DIAGNOSIS — M1A9XX0 Chronic gout, unspecified, without tophus (tophi): Secondary | ICD-10-CM

## 2019-05-25 DIAGNOSIS — R7303 Prediabetes: Secondary | ICD-10-CM | POA: Diagnosis not present

## 2019-05-25 DIAGNOSIS — E785 Hyperlipidemia, unspecified: Secondary | ICD-10-CM | POA: Diagnosis not present

## 2019-05-25 DIAGNOSIS — I10 Essential (primary) hypertension: Secondary | ICD-10-CM | POA: Diagnosis not present

## 2019-05-25 LAB — CBC WITH DIFFERENTIAL/PLATELET
Basophils Absolute: 0 10*3/uL (ref 0.0–0.1)
Basophils Relative: 0.3 % (ref 0.0–3.0)
Eosinophils Absolute: 0.2 10*3/uL (ref 0.0–0.7)
Eosinophils Relative: 1.5 % (ref 0.0–5.0)
HCT: 44.7 % (ref 36.0–46.0)
Hemoglobin: 14.8 g/dL (ref 12.0–15.0)
Lymphocytes Relative: 56.3 % — ABNORMAL HIGH (ref 12.0–46.0)
Lymphs Abs: 5.8 10*3/uL — ABNORMAL HIGH (ref 0.7–4.0)
MCHC: 33.1 g/dL (ref 30.0–36.0)
MCV: 87.4 fl (ref 78.0–100.0)
Monocytes Absolute: 0.7 10*3/uL (ref 0.1–1.0)
Monocytes Relative: 6.7 % (ref 3.0–12.0)
Neutro Abs: 3.6 10*3/uL (ref 1.4–7.7)
Neutrophils Relative %: 35.2 % — ABNORMAL LOW (ref 43.0–77.0)
Platelets: 285 10*3/uL (ref 150.0–400.0)
RBC: 5.11 Mil/uL (ref 3.87–5.11)
RDW: 14.1 % (ref 11.5–15.5)
WBC: 10.3 10*3/uL (ref 4.0–10.5)

## 2019-05-25 LAB — COMPREHENSIVE METABOLIC PANEL
ALT: 10 U/L (ref 0–35)
AST: 18 U/L (ref 0–37)
Albumin: 4.5 g/dL (ref 3.5–5.2)
Alkaline Phosphatase: 62 U/L (ref 39–117)
BUN: 27 mg/dL — ABNORMAL HIGH (ref 6–23)
CO2: 32 mEq/L (ref 19–32)
Calcium: 9.7 mg/dL (ref 8.4–10.5)
Chloride: 101 mEq/L (ref 96–112)
Creatinine, Ser: 1.23 mg/dL — ABNORMAL HIGH (ref 0.40–1.20)
GFR: 41.18 mL/min — ABNORMAL LOW (ref >60.00)
Glucose, Bld: 101 mg/dL — ABNORMAL HIGH (ref 70–99)
Potassium: 4.3 mEq/L (ref 3.5–5.1)
Sodium: 140 mEq/L (ref 135–145)
Total Bilirubin: 0.6 mg/dL (ref 0.2–1.2)
Total Protein: 7.1 g/dL (ref 6.0–8.3)

## 2019-05-25 LAB — LIPID PANEL
Cholesterol: 211 mg/dL — ABNORMAL HIGH (ref 0–200)
HDL: 68.8 mg/dL (ref >39.00)
LDL Cholesterol: 117 mg/dL — ABNORMAL HIGH (ref 0–99)
NonHDL: 141.82
Total CHOL/HDL Ratio: 3
Triglycerides: 124 mg/dL (ref 0.0–149.0)
VLDL: 24.8 mg/dL (ref 0.0–40.0)

## 2019-05-25 LAB — HEMOGLOBIN A1C: Hgb A1c MFr Bld: 5.9 % (ref 4.6–6.5)

## 2019-05-25 LAB — TSH: TSH: 2.97 u[IU]/mL (ref 0.35–4.50)

## 2019-05-25 LAB — URIC ACID: Uric Acid, Serum: 5.8 mg/dL (ref 2.4–7.0)

## 2019-05-26 ENCOUNTER — Ambulatory Visit: Payer: Medicare Other

## 2019-05-26 ENCOUNTER — Ambulatory Visit (INDEPENDENT_AMBULATORY_CARE_PROVIDER_SITE_OTHER): Payer: Medicare Other

## 2019-05-26 VITALS — Ht 62.0 in | Wt 146.0 lb

## 2019-05-26 DIAGNOSIS — Z0000 Encounter for general adult medical examination without abnormal findings: Secondary | ICD-10-CM | POA: Diagnosis not present

## 2019-05-26 NOTE — Progress Notes (Signed)
Subjective:   Linda Robinson is a 85 y.o. female who presents for Medicare Annual (Subsequent) preventive examination.  This visit type was conducted due to national recommendations for restrictions regarding the COVID-19 Pandemic (e.g. social distancing). This format is felt to be most appropriate for this patient at this time. All issues noted in this document were discussed and addressed. No physical exam was performed (except for noted visual exam findings with Video Visits). This patient, [name], has given permission to perform this visit via telephone. Vital signs may be absent or patient reported.  Patient location:  At home  Nurse location:  At home    Review of Systems:  n/a Cardiac Risk Factors include: advanced age (>14men, >53 women);dyslipidemia;hypertension;sedentary lifestyle     Objective:     Vitals: Ht 5\' 2"  (1.575 m) Comment: per patient  Wt 146 lb (66.2 kg) Comment: per patient  BMI 26.70 kg/m   Body mass index is 26.7 kg/m.  Advanced Directives 05/26/2019 05/16/2018 05/11/2017 03/06/2017 03/05/2017 07/07/2016 11/15/2014  Does Patient Have a Medical Advance Directive? No Yes Yes Yes Yes No No  Type of Advance Directive - Healthcare Power of [place] -  Does patient want to make changes to medical advance directive? - - - No - Patient declined No - Patient declined Yes - information given -  Copy of Healthcare Power of Attorney in Chart? - No - copy requested No - copy requested No - copy requested - No - copy requested -  Would patient like information on creating a medical advance directive? - - - - - Yes - Scientist, clinical (histocompatibility and immunogenetics) given No - patient declined information    Tobacco Social History   Tobacco Use  Smoking Status Never Smoker  Smokeless Tobacco Never Used     Counseling given: Not Answered   Clinical Intake:  Pre-visit preparation completed: Yes   Pain : No/denies pain     Nutritional Status: BMI 25 -29 Overweight Nutritional Risks: None Diabetes: No  How often do you need to have someone help you when you read instructions, pamphlets, or other written materials from your doctor or pharmacy?: 1 - Never What is the last grade level you completed in school?: 2 years college  Interpreter Needed?: No  Information entered by :: NAllen LPN  Past Medical History:  Diagnosis Date  . Basal cell carcinoma    removed  . Chest pain    a. 02/2017 MV: EF 54%, small, distal anterior wall/apical infacrt (likely misregistration), no ischemia-->Low risk; b. 02/2017 Echo: EF 55-60%, mild MR, PASP 27mmHg.  Marland Kitchen Chronic back pain    stenosis  . Constipation    takes Colace daily  . Depression    takes Zoloft daily  . Diverticulosis of colon   . GERD (gastroesophageal reflux disease)    atypical chest pain  . Gout    hx of;was on Allopurinol but taken off 6 wks ago by medical MD  . History of diverticulitis of colon   . HLD (hyperlipidemia)    takes Pravastatin daily  . HTN (hypertension)    takes Amlodipine and carvedilol  . OA (osteoarthritis)    right knee  . Osteopenia    takes Calcium and Vit D daily  . Vertigo    takes Antivert daily as needed   Past Surgical History:  Procedure Laterality Date  . APPENDECTOMY  at age 34  . BACK SURGERY    .  CATARACT EXTRACTION Right 04/2006  . CHOLECYSTECTOMY    . COLONOSCOPY    . KNEE SURGERY Right    Medial meniscus tear  . LUMBAR LAMINECTOMY/DECOMPRESSION MICRODISCECTOMY Bilateral 11/13/2014   Procedure: Lumbar one-two Bilateral laminectomy and foraminotomy, Left Lumbar one-two microdiscectomy ;  Surgeon: Faythe Ghee, MD;  Location: [facility];  Service: Neurosurgery;  Laterality: Bilateral;  . NECK SURGERY     x 2;fusion  . PARTIAL COLECTOMY  09/1999   d/t diverticulitis  . TUBAL LIGATION     Family History  Problem Relation Age of Onset  . Cancer Father        throat and  bladder  . Diabetes Father   . Hypertension Father   . Heart attack Father   . Stroke Mother   . Alzheimer's disease Mother   . Hypertension Mother    Social History   Socioeconomic History  . Marital status: Married    Spouse name: Not on file  . Number of children: Not on file  . Years of education: Not on file  . Highest education level: Not on file  Occupational History  . Occupation: Retired  Scientific laboratory technician  . Financial resource strain: Not hard at all  . Food insecurity    Worry: Never true    Inability: Never true  . Transportation needs    Medical: No    Non-medical: No  Tobacco Use  . Smoking status: Never Smoker  . Smokeless tobacco: Never Used  Substance and Sexual Activity  . Alcohol use: No    Alcohol/week: 0.0 standard drinks  . Drug use: Never  . Sexual activity: Not Currently    Birth control/protection: Post-menopausal  Lifestyle  . Physical activity    Days per week: 0 days    Minutes per session: 0 min  . Stress: Not at all  Relationships  . Social Herbalist on phone: Not on file    Gets together: Not on file    Attends religious service: Not on file    Active member of club or organization: Not on file    Attends meetings of clubs or organizations: Not on file    Relationship status: Not on file  Other Topics Concern  . Not on file  Social History Narrative  . Not on file    Outpatient Encounter Medications as of 05/26/2019  Medication Sig  . amLODipine (NORVASC) 10 MG tablet Take 1 tablet (10 mg total) by mouth daily.  Marland Kitchen aspirin EC 81 MG tablet Take 1 tablet (81 mg total) by mouth daily.  . Calcium Carbonate-Vitamin D (CALCIUM 500 + D) 500-125 MG-UNIT TABS Take by mouth.  . Cranberry 250 MG CAPS Take 1 capsule by mouth daily.    Marland Kitchen docusate sodium (COLACE) 100 MG capsule Take 100-200 mg by mouth 2 (two) times daily. 100 mg in the morning and 200 mg at night  . famotidine (PEPCID) 20 MG tablet Take 1 tablet (20 mg total) by mouth  2 (two) times daily.  . fish oil-omega-3 fatty acids 1000 MG capsule Take 1 g by mouth at bedtime.   . furosemide (LASIX) 20 MG tablet TAKE 1 TABLET DAILY AS NEEDED FOR SWELLING  . hydrALAZINE (APRESOLINE) 10 MG tablet Take 1 tablet (10 mg total) by mouth daily.  . meclizine (ANTIVERT) 25 MG tablet Take 25 mg by mouth 4 (four) times daily as needed for dizziness.   Marland Kitchen omeprazole (PRILOSEC) 20 MG capsule Take 1 capsule (20 mg  total) by mouth daily.  . pravastatin (PRAVACHOL) 20 MG tablet TAKE 1 TABLET DAILY  . sertraline (ZOLOFT) 100 MG tablet TAKE 1 TABLET DAILY  . Tetrahydrozoline HCl (EYE DROPS OP) Apply 1-2 drops to eye as needed (for dry eye).  . ciprofloxacin (CIPRO) 250 MG tablet Take 1 tablet (250 mg total) by mouth 2 (two) times daily. (Patient not taking: Reported on 05/26/2019)  . predniSONE (DELTASONE) 20 MG tablet 2 tabs po daily for 5 days, then 1 tab po daily for 5 days (Patient not taking: Reported on 05/26/2019)  . ranitidine (ZANTAC) 150 MG capsule Take 1 capsule (150 mg total) by mouth every evening. (Patient not taking: Reported on 05/26/2019)  . tizanidine (ZANAFLEX) 2 MG capsule Take 1 capsule (2 mg total) by mouth at bedtime as needed for muscle spasms. (Patient not taking: Reported on 05/26/2019)   No facility-administered encounter medications on file as of 05/26/2019.     Activities of Daily Living In your present state of health, do you have any difficulty performing the following activities: 05/26/2019  Hearing? 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 N  Difficulty concentrating or making decisions? N  Walking or climbing stairs? Y  Dressing or bathing? N  Doing errands, shopping? N  Preparing Food and eating ? N  Using the Toilet? N  In the past six months, have you accidently leaked urine? Y  Do you have problems with loss of bowel control? N  Managing your Medications? N  Managing your Finances? N  Housekeeping or managing your Housekeeping? N  Some recent data  might be hidden    Patient Care Team: Tower, Wynelle Fanny, MD as PCP - General Starling Manns, MD as Consulting Physician (Orthopedic Surgery)    Assessment:   This is a routine wellness examination for Linda Robinson.  Exercise Activities and Dietary recommendations Current Exercise Habits: The patient does not participate in regular exercise at present  Goals    . Increase water intake     Starting 05/16/2018, I will continue to drink at least 6-8 glasses of water daily.     . Patient Stated     05/26/2019, wants to stop urinary leakage       Fall Risk Fall Risk  05/26/2019 05/16/2018 05/11/2017 07/07/2016 07/05/2015  Falls in the past year? 1 Yes Yes No Yes  Comment 3-4 falls loses balance, once tripped over something 1 fall in home after tripping over cord, 2 fall in garden after tripping over vines pt fell on back porch; injury to right eye - -  Number falls in past yr: 1 2 or more 1 - 1  Injury with Fall? 1 No Yes - Yes  Comment black eyes, fractured orbit - - - -  Risk for fall due to : History of fall(s);Impaired balance/gait;Medication side effect Impaired balance/gait;Impaired mobility;History of fall(s) - - -  Follow up Falls evaluation completed;Falls prevention discussed - - - -   Is the patient's home free of loose throw rugs in walkways, pet beds, electrical cords, etc?   yes      Grab bars in the bathroom? yes      Handrails on the stairs?   yes      Adequate lighting?   yes  Timed Get Up and Go performed: n/a  Depression Screen PHQ 2/9 Scores 05/26/2019 05/16/2018 05/11/2017 07/07/2016  PHQ - 2 Score 0 0 0 0  PHQ- 9 Score 3 0 3 -     Cognitive  Function MMSE - Mini Mental State Exam 05/26/2019 05/16/2018 05/11/2017 07/07/2016  Orientation to time 4 5 5 5   Orientation to Place 5 5 5 5   Registration 3 3 3 3   Attention/ Calculation 5 0 0 0  Recall 2 3 3 3   Language- name 2 objects 0 0 0 0  Language- repeat 1 1 1 1   Language- follow 3 step command 0 3 3 3   Language- read &  follow direction 0 0 0 0  Write a sentence 0 0 0 0  Copy design 0 0 0 0  Total score 20 20 20 20    Mini Cog  Mini-Cog screen was completed. Maximum score is 22. A value of 0 denotes this part of the MMSE was not completed or the patient failed this part of the Mini-Cog screening.       Immunization History  Administered Date(s) Administered  . Influenza Split 07/23/2011, 07/01/2017  . Influenza Whole 08/11/2004, 07/08/2007, 07/09/2008, 06/19/2009, 07/08/2010  . Influenza, High Dose Seasonal PF 07/31/2015, 06/30/2016, 07/01/2017, 06/13/2018  . Influenza-Unspecified 06/28/2013, 08/05/2015  . PPD Test 10/03/2015  . Pneumococcal Conjugate-13 06/27/2014  . Pneumococcal Polysaccharide-23 02/10/2012  . Td 08/28/1997, 02/10/2012    Qualifies for Shingles Vaccine? yes  Screening Tests Health Maintenance  Topic Date Due  . INFLUENZA VACCINE  04/29/2019  . MAMMOGRAM  06/09/2019  . TETANUS/TDAP  02/09/2022  . DEXA SCAN  Completed  . PNA vac Low Risk Adult  Completed    Cancer Screenings: Lung: Low Dose CT Chest recommended if Age 6-80 years, 30 pack-year currently smoking OR have quit w/in 15years. Patient does not qualify. Breast:  Up to date on Mammogram? Yes   Up to date of Bone Density/Dexa? Yes Colorectal: not required  Additional Screenings: : Hepatitis C Screening: n/a     Plan:   Patient wants to stop urine leakage.  I have personally reviewed and noted the following in the patient's chart:   . Medical and social history . Use of alcohol, tobacco or illicit drugs  . Current medications and supplements . Functional ability and status . Nutritional status . Physical activity . Advanced directives . List of other physicians . Hospitalizations, surgeries, and ER visits in previous 12 months . Vitals . Screenings to include cognitive, depression, and falls . Referrals and appointments  In addition, I have reviewed and discussed with patient certain preventive  protocols, quality metrics, and best practice recommendations. A written personalized care plan for preventive services as well as general preventive health recommendations were provided to patient.     Kellie Simmering, LPN  [phone]

## 2019-05-26 NOTE — Progress Notes (Signed)
PCP notes:  Health Maintenance:  No gaps  Abnormal Screenings:  Falls: patient has had 3-4 falls  Patient concerns:  Would like something to stop urine leakage  Nurse concerns:  So many falls  Next PCP appt.: 05/29/2019 at 11:30

## 2019-05-26 NOTE — Patient Instructions (Signed)
Linda Robinson , Thank you for taking time to come for your Medicare Wellness Visit. I appreciate your ongoing commitment to your health goals. Please review the following plan we discussed and let me know if I can assist you in the future.   Screening recommendations/referrals: Colonoscopy: not required Mammogram: 05/2018 Bone Density: 06/2015 Recommended yearly ophthalmology/optometry visit for glaucoma screening and checkup Recommended yearly dental visit for hygiene and checkup  Vaccinations: Influenza vaccine: 04/2019 Pneumococcal vaccine: 05/2014 Tdap vaccine: 01/2012 Shingles vaccine: discussed    Advanced directives: Advance directive discussed with you today.   Conditions/risks identified: overweight  Next appointment: 05/29/2019 at 11:30   Preventive Care 83 Years and Older, Female Preventive care refers to lifestyle choices and visits with your health care provider that can promote health and wellness. What does preventive care include?  A yearly physical exam. This is also called an annual well check.  Dental exams once or twice a year.  Routine eye exams. Ask your health care provider how often you should have your eyes checked.  Personal lifestyle choices, including:  Daily care of your teeth and gums.  Regular physical activity.  Eating a healthy diet.  Avoiding tobacco and drug use.  Limiting alcohol use.  Practicing safe sex.  Taking low-dose aspirin every day.  Taking vitamin and mineral supplements as recommended by your health care provider. What happens during an annual well check? The services and screenings done by your health care provider during your annual well check will depend on your age, overall health, lifestyle risk factors, and family history of disease. Counseling  Your health care provider may ask you questions about your:  Alcohol use.  Tobacco use.  Drug use.  Emotional well-being.  Home and relationship well-being.  Sexual  activity.  Eating habits.  History of falls.  Memory and ability to understand (cognition).  Work and work Statistician.  Reproductive health. Screening  You may have the following tests or measurements:  Height, weight, and BMI.  Blood pressure.  Lipid and cholesterol levels. These may be checked every 5 years, or more frequently if you are over 60 years old.  Skin check.  Lung cancer screening. You may have this screening every year starting at age 28 if you have a 30-pack-year history of smoking and currently smoke or have quit within the past 15 years.  Fecal occult blood test (FOBT) of the stool. You may have this test every year starting at age 59.  Flexible sigmoidoscopy or colonoscopy. You may have a sigmoidoscopy every 5 years or a colonoscopy every 10 years starting at age 46.  Hepatitis C blood test.  Hepatitis B blood test.  Sexually transmitted disease (STD) testing.  Diabetes screening. This is done by checking your blood sugar (glucose) after you have not eaten for a while (fasting). You may have this done every 1-3 years.  Bone density scan. This is done to screen for osteoporosis. You may have this done starting at age 31.  Mammogram. This may be done every 1-2 years. Talk to your health care provider about how often you should have regular mammograms. Talk with your health care provider about your test results, treatment options, and if necessary, the need for more tests. Vaccines  Your health care provider may recommend certain vaccines, such as:  Influenza vaccine. This is recommended every year.  Tetanus, diphtheria, and acellular pertussis (Tdap, Td) vaccine. You may need a Td booster every 10 years.  Zoster vaccine. You may need this after  age 61.  Pneumococcal 13-valent conjugate (PCV13) vaccine. One dose is recommended after age 34.  Pneumococcal polysaccharide (PPSV23) vaccine. One dose is recommended after age 7. Talk to your health care  provider about which screenings and vaccines you need and how often you need them. This information is not intended to replace advice given to you by your health care provider. Make sure you discuss any questions you have with your health care provider. Document Released: 10/11/2015 Document Revised: 06/03/2016 Document Reviewed: 07/16/2015 Elsevier Interactive Patient Education  2017 [place] Prevention in the Home Falls can cause injuries. They can happen to people of all ages. There are many things you can do to make your home safe and to help prevent falls. What can I do on the outside of my home?  Regularly fix the edges of walkways and driveways and fix any cracks.  Remove anything that might make you trip as you walk through a door, such as a raised step or threshold.  Trim any bushes or trees on the path to your home.  Use bright outdoor lighting.  Clear any walking paths of anything that might make someone trip, such as rocks or tools.  Regularly check to see if handrails are loose or broken. Make sure that both sides of any steps have handrails.  Any raised decks and porches should have guardrails on the edges.  Have any leaves, snow, or ice cleared regularly.  Use sand or salt on walking paths during winter.  Clean up any spills in your garage right away. This includes oil or grease spills. What can I do in the bathroom?  Use night lights.  Install grab bars by the toilet and in the tub and shower. Do not use towel bars as grab bars.  Use non-skid mats or decals in the tub or shower.  If you need to sit down in the shower, use a plastic, non-slip stool.  Keep the floor dry. Clean up any water that spills on the floor as soon as it happens.  Remove soap buildup in the tub or shower regularly.  Attach bath mats securely with double-sided non-slip rug tape.  Do not have throw rugs and other things on the floor that can make you trip. What can I do in  the bedroom?  Use night lights.  Make sure that you have a light by your bed that is easy to reach.  Do not use any sheets or blankets that are too big for your bed. They should not hang down onto the floor.  Have a firm chair that has side arms. You can use this for support while you get dressed.  Do not have throw rugs and other things on the floor that can make you trip. What can I do in the kitchen?  Clean up any spills right away.  Avoid walking on wet floors.  Keep items that you use a lot in easy-to-reach places.  If you need to reach something above you, use a strong step stool that has a grab bar.  Keep electrical cords out of the way.  Do not use floor polish or wax that makes floors slippery. If you must use wax, use non-skid floor wax.  Do not have throw rugs and other things on the floor that can make you trip. What can I do with my stairs?  Do not leave any items on the stairs.  Make sure that there are handrails on both sides of the stairs  and use them. Fix handrails that are broken or loose. Make sure that handrails are as long as the stairways.  Check any carpeting to make sure that it is firmly attached to the stairs. Fix any carpet that is loose or worn.  Avoid having throw rugs at the top or bottom of the stairs. If you do have throw rugs, attach them to the floor with carpet tape.  Make sure that you have a light switch at the top of the stairs and the bottom of the stairs. If you do not have them, ask someone to add them for you. What else can I do to help prevent falls?  Wear shoes that:  Do not have high heels.  Have rubber bottoms.  Are comfortable and fit you well.  Are closed at the toe. Do not wear sandals.  If you use a stepladder:  Make sure that it is fully opened. Do not climb a closed stepladder.  Make sure that both sides of the stepladder are locked into place.  Ask someone to hold it for you, if possible.  Clearly mark and  make sure that you can see:  Any grab bars or handrails.  First and last steps.  Where the edge of each step is.  Use tools that help you move around (mobility aids) if they are needed. These include:  Canes.  Walkers.  Scooters.  Crutches.  Turn on the lights when you go into a dark area. Replace any light bulbs as soon as they burn out.  Set up your furniture so you have a clear path. Avoid moving your furniture around.  If any of your floors are uneven, fix them.  If there are any pets around you, be aware of where they are.  Review your medicines with your doctor. Some medicines can make you feel dizzy. This can increase your chance of falling. Ask your doctor what other things that you can do to help prevent falls. This information is not intended to replace advice given to you by your health care provider. Make sure you discuss any questions you have with your health care provider. Document Released: 07/11/2009 Document Revised: 02/20/2016 Document Reviewed: 10/19/2014 Elsevier Interactive Patient Education  2017 [employer].

## 2019-05-29 ENCOUNTER — Encounter: Payer: Self-pay | Admitting: Family Medicine

## 2019-05-29 ENCOUNTER — Other Ambulatory Visit: Payer: Self-pay

## 2019-05-29 ENCOUNTER — Ambulatory Visit (INDEPENDENT_AMBULATORY_CARE_PROVIDER_SITE_OTHER): Payer: Medicare Other | Admitting: Family Medicine

## 2019-05-29 DIAGNOSIS — J069 Acute upper respiratory infection, unspecified: Secondary | ICD-10-CM

## 2019-05-29 DIAGNOSIS — Z20822 Contact with and (suspected) exposure to covid-19: Secondary | ICD-10-CM

## 2019-05-29 MED ORDER — BENZONATATE 200 MG PO CAPS: 200.0000 mg | ORAL_CAPSULE | Freq: Three times a day (TID) | ORAL | 1 refills | Status: DC | PRN

## 2019-05-29 NOTE — Patient Instructions (Addendum)
Go to the [facility] center for covid testing before 3:30   Drink lots of fluids mucinex is fine as is tylenol  I will send in tessalon pills for cough to use as needed   Update if not starting to improve in a week or if worsening   Also if fever/short of breath/ loss of taste or smell or GI symptoms

## 2019-05-29 NOTE — Progress Notes (Signed)
Virtual Visit via Telephone Note  I connected with Linda Robinson on 05/29/19 at 11:30 AM EDT by telephone and verified that I am speaking with the correct person using two identifiers.  Location: Patient: home Provider: office    I discussed the limitations, risks, security and privacy concerns of performing an evaluation and management service by telephone and the availability of in person appointments. I also discussed with the patient that there may be a patient responsible charge related to this service. The patient expressed understanding and agreed to proceed.   History of Present Illness: Yesterday afternoon -developed scratchy throat and cough  Now a lot of sinus pressure  Nasal d/c is white   No fever-keeps checking  No body aches or chills No ear pain  One ear stays muffled   Dry cough-now able to cough up white phlegm  No sick contacts   This feels like a cold   She can still taste and smell No headache or dizziness   No sob or wheezing   She last went to grocery and drug store last week   She took tylenol for sore throat  Has some plain mucinex   Patient Active Problem List   Diagnosis Date Noted  . URI (upper respiratory infection) 05/29/2019  . UTI (urinary tract infection) 01/25/2019  . Hearing loss of right ear 10/17/2018  . Contusion of face 09/20/2018  . Screening mammogram, encounter for 05/23/2018  . Substernal chest pain 08/16/2017  . Atherosclerosis of aorta ([place]) 03/09/2017  . Weight loss 02/23/2017  . Routine general medical examination at a health care facility 07/05/2015  . Estrogen deficiency 07/05/2015  . Prediabetes 01/01/2015  . Lumbar spinal stenosis 11/13/2014  . Vertigo 10/05/2014  . Risk for falls 06/27/2014  . Low back pain on right side with sciatica 07/24/2013  . Edema 04/26/2013  . Encounter for Medicare annual wellness exam 04/11/2013  . GERD (gastroesophageal reflux disease)   . Bradycardia 05/19/2010  . Disorder of  bone and cartilage 01/30/2009  . CONSTIPATION 11/01/2007  . Depression with anxiety 02/14/2007  . Hyperlipidemia LDL goal <130 02/04/2007  . Gout 02/04/2007  . Essential hypertension 02/04/2007  . DIVERTICULOSIS, COLON 02/04/2007  . Renal insufficiency 02/04/2007  . OSTEOARTHRITIS 02/04/2007  . [place] DISEASE 02/04/2007   Past Medical History:  Diagnosis Date  . Basal cell carcinoma    removed  . Chest pain    a. 02/2017 MV: EF 54%, small, distal anterior wall/apical infacrt (likely misregistration), no ischemia-->Low risk; b. 02/2017 Echo: EF 55-60%, mild MR, PASP 88mmHg.  Marland Kitchen Chronic back pain    stenosis  . Constipation    takes Colace daily  . Depression    takes Zoloft daily  . Diverticulosis of colon   . GERD (gastroesophageal reflux disease)    atypical chest pain  . Gout    hx of;was on Allopurinol but taken off 6 wks ago by medical MD  . History of diverticulitis of colon   . HLD (hyperlipidemia)    takes Pravastatin daily  . HTN (hypertension)    takes Amlodipine and carvedilol  . OA (osteoarthritis)    right knee  . Osteopenia    takes Calcium and Vit D daily  . Vertigo    takes Antivert daily as needed   Past Surgical History:  Procedure Laterality Date  . APPENDECTOMY  at age 60  . BACK SURGERY    . CATARACT EXTRACTION Right 04/2006  . CHOLECYSTECTOMY    .  COLONOSCOPY    . KNEE SURGERY Right    Medial meniscus tear  . LUMBAR LAMINECTOMY/DECOMPRESSION MICRODISCECTOMY Bilateral 11/13/2014   Procedure: Lumbar one-two Bilateral laminectomy and foraminotomy, Left Lumbar one-two microdiscectomy ;  Surgeon: Faythe Ghee, MD;  Location: [facility];  Service: Neurosurgery;  Laterality: Bilateral;  . NECK SURGERY     x 2;fusion  . PARTIAL COLECTOMY  09/1999   d/t diverticulitis  . TUBAL LIGATION     Social History   Tobacco Use  . Smoking status: Never Smoker  . Smokeless tobacco: Never Used  Substance Use Topics  . Alcohol use: No     Alcohol/week: 0.0 standard drinks  . Drug use: Never   Family History  Problem Relation Age of Onset  . Cancer Father        throat and bladder  . Diabetes Father   . Hypertension Father   . Heart attack Father   . Stroke Mother   . Alzheimer's disease Mother   . Hypertension Mother    Allergies  Allergen Reactions  . Ace Inhibitors     REACTION: increased K+, increased creat.  . Bactrim [Sulfamethoxazole-Trimethoprim]     rash  . Cephalosporins     REACTION: reaction not known  . Colesevelam     REACTION: constipation  . Hctz [Hydrochlorothiazide]     Makes her feel "bad"  . Losartan     Increased K  . Lovastatin     REACTION: doesn't work  . Raloxifene     REACTION: cramps  . Rosuvastatin     REACTION: myalgia  . Simvastatin     REACTION: myalgia   Current Outpatient Medications on File Prior to Visit  Medication Sig Dispense Refill  . amLODipine (NORVASC) 10 MG tablet Take 1 tablet (10 mg total) by mouth daily. 90 tablet 3  . aspirin EC 81 MG tablet Take 1 tablet (81 mg total) by mouth daily. 90 tablet 3  . Calcium Carbonate-Vitamin D (CALCIUM 500 + D) 500-125 MG-UNIT TABS Take by mouth.    . Cranberry 250 MG CAPS Take 1 capsule by mouth daily.      Marland Kitchen docusate sodium (COLACE) 100 MG capsule Take 100-200 mg by mouth 2 (two) times daily. 100 mg in the morning and 200 mg at night    . famotidine (PEPCID) 20 MG tablet Take 1 tablet (20 mg total) by mouth 2 (two) times daily. 180 tablet 3  . fish oil-omega-3 fatty acids 1000 MG capsule Take 1 g by mouth at bedtime.     . furosemide (LASIX) 20 MG tablet TAKE 1 TABLET DAILY AS NEEDED FOR SWELLING 90 tablet 3  . hydrALAZINE (APRESOLINE) 10 MG tablet Take 1 tablet (10 mg total) by mouth daily. 90 tablet 3  . meclizine (ANTIVERT) 25 MG tablet Take 25 mg by mouth 4 (four) times daily as needed for dizziness.     Marland Kitchen omeprazole (PRILOSEC) 20 MG capsule Take 1 capsule (20 mg total) by mouth daily. 90 capsule 3  . pravastatin  (PRAVACHOL) 20 MG tablet TAKE 1 TABLET DAILY 90 tablet 3  . predniSONE (DELTASONE) 20 MG tablet 2 tabs po daily for 5 days, then 1 tab po daily for 5 days 15 tablet 0  . ranitidine (ZANTAC) 150 MG capsule Take 1 capsule (150 mg total) by mouth every evening. 90 capsule 3  . sertraline (ZOLOFT) 100 MG tablet TAKE 1 TABLET DAILY 90 tablet 3  . Tetrahydrozoline HCl (EYE DROPS OP) Apply 1-2  drops to eye as needed (for dry eye).    . tizanidine (ZANAFLEX) 2 MG capsule Take 1 capsule (2 mg total) by mouth at bedtime as needed for muscle spasms. 90 capsule 0   No current facility-administered medications on file prior to visit.     Review of Systems  Constitutional: Negative for chills, fever and malaise/fatigue.  HENT: Positive for hearing loss and sore throat. Negative for ear discharge, ear pain and sinus pain.   Eyes: Negative for blurred vision, discharge and redness.  Respiratory: Positive for cough and sputum production. Negative for hemoptysis, shortness of breath and wheezing.   Cardiovascular: Negative for chest pain and palpitations.  Gastrointestinal: Negative for abdominal pain, diarrhea, nausea and vomiting.  Musculoskeletal: Negative for myalgias.  Skin: Negative for rash.  Neurological: Negative for dizziness and headaches.      Observations/Objective: Pt sounds slightly congested but not hoarse  Not distressed Nl cognition and affect  No cough or sob during conversation    Assessment and Plan: Problem List Items Addressed This Visit      Respiratory   URI (upper respiratory infection)    With congestion and cough (no fever) covid test ordered for [facility] [place] -she will go now Isolate until test returns and symptoms are better Enc fluids and rest  mucinex for congestion /cough  Tylenol for pain if needed  Sent tessalon for cough  Update if not starting to improve in a week or if worsening  (or if any new symptoms)      Relevant Orders   Novel Coronavirus, NAA  ([facility])       Follow Up Instructions: Go to the [facility] center for covid testing before 3:30   Drink lots of fluids mucinex is fine as is tylenol  I will send in tessalon pills for cough to use as needed   Update if not starting to improve in a week or if worsening   Also if fever/short of breath/ loss of taste or smell or GI symptoms    I discussed the assessment and treatment plan with the patient. The patient was provided an opportunity to ask questions and all were answered. The patient agreed with the plan and demonstrated an understanding of the instructions.   The patient was advised to call back or seek an in-person evaluation if the symptoms worsen or if the condition fails to improve as anticipated.  I provided 15 minutes of non-face-to-face time during this encounter.   Loura Pardon, MD

## 2019-05-29 NOTE — Assessment & Plan Note (Signed)
With congestion and cough (no fever) covid test ordered for [facility] -she will go now Isolate until test returns and symptoms are better Enc fluids and rest  mucinex for congestion /cough  Tylenol for pain if needed  Sent tessalon for cough  Update if not starting to improve in a week or if worsening  (or if any new symptoms)

## 2019-05-31 LAB — NOVEL CORONAVIRUS, NAA: SARS-CoV-2, NAA: NOT DETECTED

## 2019-06-02 ENCOUNTER — Other Ambulatory Visit: Payer: Self-pay | Admitting: Family Medicine

## 2019-06-12 ENCOUNTER — Other Ambulatory Visit: Payer: Self-pay

## 2019-06-12 ENCOUNTER — Ambulatory Visit (INDEPENDENT_AMBULATORY_CARE_PROVIDER_SITE_OTHER): Payer: Medicare Other | Admitting: Family Medicine

## 2019-06-12 ENCOUNTER — Encounter: Payer: Self-pay | Admitting: Family Medicine

## 2019-06-12 VITALS — BP 116/62 | HR 71 | Temp 98.5°F | Ht 61.5 in | Wt 140.0 lb

## 2019-06-12 DIAGNOSIS — Z0000 Encounter for general adult medical examination without abnormal findings: Secondary | ICD-10-CM

## 2019-06-12 DIAGNOSIS — I10 Essential (primary) hypertension: Secondary | ICD-10-CM

## 2019-06-12 DIAGNOSIS — M949 Disorder of cartilage, unspecified: Secondary | ICD-10-CM

## 2019-06-12 DIAGNOSIS — Z9181 History of falling: Secondary | ICD-10-CM

## 2019-06-12 DIAGNOSIS — M899 Disorder of bone, unspecified: Secondary | ICD-10-CM

## 2019-06-12 DIAGNOSIS — F418 Other specified anxiety disorders: Secondary | ICD-10-CM

## 2019-06-12 DIAGNOSIS — E785 Hyperlipidemia, unspecified: Secondary | ICD-10-CM

## 2019-06-12 DIAGNOSIS — N289 Disorder of kidney and ureter, unspecified: Secondary | ICD-10-CM | POA: Diagnosis not present

## 2019-06-12 DIAGNOSIS — R7303 Prediabetes: Secondary | ICD-10-CM

## 2019-06-12 DIAGNOSIS — K219 Gastro-esophageal reflux disease without esophagitis: Secondary | ICD-10-CM

## 2019-06-12 DIAGNOSIS — R2689 Other abnormalities of gait and mobility: Secondary | ICD-10-CM

## 2019-06-12 DIAGNOSIS — Z1231 Encounter for screening mammogram for malignant neoplasm of breast: Secondary | ICD-10-CM

## 2019-06-12 MED ORDER — OMEPRAZOLE 20 MG PO CPDR: 20.0000 mg | DELAYED_RELEASE_CAPSULE | Freq: Every day | ORAL | 3 refills | Status: DC

## 2019-06-12 MED ORDER — FAMOTIDINE 20 MG PO TABS: 20.0000 mg | ORAL_TABLET | Freq: Two times a day (BID) | ORAL | 3 refills | Status: DC

## 2019-06-12 MED ORDER — FUROSEMIDE 20 MG PO TABS: ORAL_TABLET | ORAL | 3 refills | Status: DC

## 2019-06-12 MED ORDER — PRAVASTATIN SODIUM 20 MG PO TABS: 20.0000 mg | ORAL_TABLET | Freq: Every day | ORAL | 3 refills | Status: DC

## 2019-06-12 MED ORDER — AMLODIPINE BESYLATE 10 MG PO TABS: 10.0000 mg | ORAL_TABLET | Freq: Every day | ORAL | 3 refills | Status: DC

## 2019-06-12 MED ORDER — HYDRALAZINE HCL 10 MG PO TABS: 10.0000 mg | ORAL_TABLET | Freq: Every day | ORAL | 3 refills | Status: DC

## 2019-06-12 MED ORDER — SERTRALINE HCL 100 MG PO TABS: 100.0000 mg | ORAL_TABLET | Freq: Every day | ORAL | 3 refills | Status: DC

## 2019-06-12 NOTE — Assessment & Plan Note (Signed)
Pt declines another dexa or any tx Long disc re: fall prev  PT ref done for balance/gait Enc D and ca supplementation

## 2019-06-12 NOTE — Assessment & Plan Note (Signed)
bp in fair control at this time  BP Readings from Last 1 Encounters:  06/12/19 116/62   No changes needed Most recent labs reviewed  Disc lifstyle change with low sodium diet and exercise

## 2019-06-12 NOTE — Assessment & Plan Note (Signed)
Cr up slt at 1.23 Enc to drink more water

## 2019-06-12 NOTE — Assessment & Plan Note (Signed)
Continues sertraine 100 mg which helps Very anxious currently about grandson with a tumor Reviewed stressors/ coping techniques/symptoms/ support sources/ tx options and side effects in detail today Enc good self care

## 2019-06-12 NOTE — Assessment & Plan Note (Signed)
Disc goals for lipids and reasons to control them Rev last labs with pt Rev low sat fat diet in detail  LDL up slightly at 117  Continues pravastatin and diet

## 2019-06-12 NOTE — Assessment & Plan Note (Signed)
Heartburn when she is anxious  Enc her to continue pepcid (prev on ranitidine until it was pulled) and also continue omeprazole  Update if no improvement

## 2019-06-12 NOTE — Assessment & Plan Note (Signed)
Reviewed health habits including diet and exercise and skin cancer prevention Reviewed appropriate screening tests for age  Also reviewed health mt list, fam hx and immunization status , as well as social and family history   See HPI Labs rev  Disc fall prev and PT order done  Pt declines dexa Enc ca and D supplementation  Mammogram order done

## 2019-06-12 NOTE — Assessment & Plan Note (Signed)
Enc use of walker instead of cane Ref to PT

## 2019-06-12 NOTE — Assessment & Plan Note (Signed)
With falls Ref to PT for this -gait/vestibular training  Has chronic pain issues also  Disc  Imp of walker use /instead of cane-she agreed

## 2019-06-12 NOTE — Patient Instructions (Addendum)
Use your walker instead of cane  It is better for balance  Physical therapy may help balance in the future   Schedule your mammogram on the way out   If you are interested in the new shingles vaccine (Shingrix) - call your local pharmacy to check on coverage and availability  If affordable, get on a wait list at your pharmacy to get the vaccine.  Make sure to drink water for kidney health and uti prevention  Our office will call with a physical therapy referral for balance   Take care of yourself   Start back on the generic famotidine (Pepcid) for your stomach  Also continue omeprazole (prilosec)  If stomach pain does not improve please let me know

## 2019-06-12 NOTE — Progress Notes (Signed)
Subjective:    Patient ID: Linda Robinson, female    DOB: 08-21-1929, 85 y.o.   MRN: [id number]  HPI Here for health maintenance exam and to review chronic medical problems    Family is worried about a member with sinus tumor (grandson)  Will have surgery soon   Son lives next door and helps out    She stays tired and worn out   Had amw on 8/28 No gaps Noted multiple falls in the last year  She is supposed to use a walker instead of a cane  She has 2 walker    Wt Readings from Last 3 Encounters:  06/12/19 140 lb (63.5 kg)  05/26/19 146 lb (66.2 kg)  12/08/18 143 lb (64.9 kg)  she has less appetite - makes herself eat 3 meals per day  26.02 kg/m   Mammogram 9/19- is due  Self breast exam -no lumps   dexa 10/16 osteopenia  Declines another dexa or any tx Falls -3-4 falls in past year - garden/ tripped on electrical cord, once reached too high  Family is aware and her husband looks after her  Fractures -none (one facial fracture years ago) 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  Supplements -takes calcium and vit D   Zoster status -never had vaccine  Unsure if ins covers    bp is stable today  No cp or palpitations or headaches or edema  No side effects to medicines  BP Readings from Last 3 Encounters:  06/12/19 116/62  12/08/18 128/68  12/03/18 (!) 158/77     Renal insufficiency Lab Results  Component Value Date   CREATININE 1.23 (H) 05/25/2019   BUN 27 (H) 05/25/2019   NA 140 05/25/2019   K 4.3 05/25/2019   CL 101 05/25/2019   CO2 32 05/25/2019  needs to drink more water  Depression with anxiety  takes zoloft 100  Is anxious about family issues   Hyperlipidemia Lab Results  Component Value Date   CHOL 211 (H) 05/25/2019   CHOL 201 (H) 05/16/2018   CHOL 202 (H) 05/11/2017   Lab Results  Component Value Date   HDL 68.80 05/25/2019   HDL 67.10 05/16/2018   HDL 61.50 05/11/2017   Lab Results  Component Value Date   LDLCALC 117  (H) 05/25/2019   LDLCALC 109 (H) 05/16/2018   LDLCALC 112 (H) 05/11/2017   Lab Results  Component Value Date   TRIG 124.0 05/25/2019   TRIG 124.0 05/16/2018   TRIG 141.0 05/11/2017   Lab Results  Component Value Date   CHOLHDL 3 05/25/2019   CHOLHDL 3 05/16/2018   CHOLHDL 3 05/11/2017   Lab Results  Component Value Date   LDLDIRECT 135.4 04/11/2013   LDLDIRECT 134.0 02/03/2012   LDLDIRECT 150.8 10/30/2010   Pravastatin and diet  Is compliant  Eats healthy most of the time  No fried foods    Prediabetes Lab Results  Component Value Date   HGBA1C 5.9 05/25/2019  last time 5.7  Some sweets -does not overdue it        Review of Systems  Constitutional: Negative for activity change, appetite change, fatigue, fever and unexpected weight change.  HENT: Negative for congestion, ear pain, rhinorrhea, sinus pressure and sore throat.   Eyes: Negative for pain, redness and visual disturbance.  Respiratory: Negative for cough, shortness of breath and wheezing.   Cardiovascular: Negative for chest pain and palpitations.  Gastrointestinal: Negative for abdominal pain, blood in stool,  constipation and diarrhea.       Heartburn and epigastric pain when she feels anxious  Endocrine: Negative for polydipsia and polyuria.  Genitourinary: Negative for dysuria, frequency and urgency.  Musculoskeletal: Negative for arthralgias, back pain and myalgias.  Skin: Negative for pallor and rash.  Allergic/Immunologic: Negative for environmental allergies.  Neurological: Negative for dizziness, syncope and headaches.  Hematological: Negative for adenopathy. Does not bruise/bleed easily.  Psychiatric/Behavioral: Negative for decreased concentration and dysphoric mood. The patient is nervous/anxious.        Objective:   Physical Exam Constitutional:      General: She is not in acute distress.    Appearance: Normal appearance. She is well-developed and normal weight. She is not  ill-appearing or diaphoretic.  HENT:     Head: Normocephalic and atraumatic.     Right Ear: Tympanic membrane, ear canal and external ear normal.     Left Ear: Tympanic membrane, ear canal and external ear normal.     Nose: Nose normal. No congestion.     Mouth/Throat:     Mouth: Mucous membranes are moist.     Pharynx: Oropharynx is clear. No posterior oropharyngeal erythema.  Eyes:     General: No scleral icterus.    Extraocular Movements: Extraocular movements intact.     Conjunctiva/sclera: Conjunctivae normal.     Pupils: Pupils are equal, round, and reactive to light.  Neck:     Musculoskeletal: Normal range of motion and neck supple. No neck rigidity or muscular tenderness.     Thyroid: No thyromegaly.     Vascular: No carotid bruit or JVD.  Cardiovascular:     Rate and Rhythm: Normal rate and regular rhythm.     Pulses: Normal pulses.     Heart sounds: Normal heart sounds. No gallop.   Pulmonary:     Effort: Pulmonary effort is normal. No respiratory distress.     Breath sounds: Normal breath sounds. No wheezing.     Comments: Good air exch Chest:     Chest wall: No tenderness.  Abdominal:     General: Bowel sounds are normal. There is no distension or abdominal bruit.     Palpations: Abdomen is soft. There is no mass.     Tenderness: There is no abdominal tenderness.     Hernia: No hernia is present.  Genitourinary:    Comments: Breast exam: No mass, nodules, thickening, tenderness, bulging, retraction, inflamation, nipple discharge or skin changes noted.  No axillary or clavicular LA.     Exam done sitting Musculoskeletal: Normal range of motion.        General: No tenderness.     Right lower leg: No edema.     Left lower leg: No edema.  Lymphadenopathy:     Cervical: No cervical adenopathy.  Skin:    General: Skin is warm and dry.     Coloration: Skin is not pale.     Findings: No erythema or rash.     Comments: Solar lentigines diffusely Some sks    Neurological:     Mental Status: She is alert. Mental status is at baseline.     Cranial Nerves: No cranial nerve deficit.     Motor: No abnormal muscle tone.     Coordination: Coordination normal.     Gait: Gait normal.     Deep Tendon Reflexes: Reflexes are normal and symmetric. Reflexes normal.     Comments: Due to poor balance exam done sitting    Psychiatric:  Mood and Affect: Mood is anxious.        Cognition and Memory: Cognition and memory normal.           Assessment & Plan:   Problem List Items Addressed This Visit      Cardiovascular and Mediastinum   Essential hypertension (Chronic)    bp in fair control at this time  BP Readings from Last 1 Encounters:  06/12/19 116/62   No changes needed Most recent labs reviewed  Disc lifstyle change with low sodium diet and exercise        Relevant Medications   amLODipine (NORVASC) 10 MG tablet   pravastatin (PRAVACHOL) 20 MG tablet   furosemide (LASIX) 20 MG tablet   hydrALAZINE (APRESOLINE) 10 MG tablet     Digestive   GERD (gastroesophageal reflux disease)    Heartburn when she is anxious  Enc her to continue pepcid (prev on ranitidine until it was pulled) and also continue omeprazole  Update if no improvement       Relevant Medications   famotidine (PEPCID) 20 MG tablet   omeprazole (PRILOSEC) 20 MG capsule     Musculoskeletal and Integument   Disorder of bone and cartilage    Pt declines another dexa or any tx Long disc re: fall prev  PT ref done for balance/gait Enc D and ca supplementation         Genitourinary   Renal insufficiency    Cr up slt at 1.23 Enc to drink more water        Other   Hyperlipidemia LDL goal <130 (Chronic)    Disc goals for lipids and reasons to control them Rev last labs with pt Rev low sat fat diet in detail  LDL up slightly at 117  Continues pravastatin and diet       Relevant Medications   amLODipine (NORVASC) 10 MG tablet   pravastatin  (PRAVACHOL) 20 MG tablet   furosemide (LASIX) 20 MG tablet   hydrALAZINE (APRESOLINE) 10 MG tablet   Depression with anxiety    Continues sertraine 100 mg which helps Very anxious currently about grandson with a tumor Reviewed stressors/ coping techniques/symptoms/ support sources/ tx options and side effects in detail today Enc good self care      Relevant Medications   sertraline (ZOLOFT) 100 MG tablet   Risk for falls    Enc use of walker instead of cane Ref to PT      Prediabetes    Lab Results  Component Value Date   HGBA1C 5.9 05/25/2019   disc imp of low glycemic diet and wt loss to prevent DM2       Routine general medical examination at a health care facility - Primary    Reviewed health habits including diet and exercise and skin cancer prevention Reviewed appropriate screening tests for age  Also reviewed health mt list, fam hx and immunization status , as well as social and family history   See HPI Labs rev  Disc fall prev and PT order done  Pt declines dexa Enc ca and D supplementation  Mammogram order done         Screening mammogram, encounter for    Scheduled annual screening mammogram Nl breast exam today  Encouraged monthly self exams        Relevant Orders   MM 3D SCREEN BREAST BILATERAL   Poor balance    With falls Ref to PT for this -gait/vestibular training  Has chronic pain issues  also  Disc  Imp of walker use /instead of cane-she agreed      Relevant Orders   Ambulatory referral to Physical Therapy

## 2019-06-12 NOTE — Assessment & Plan Note (Signed)
Lab Results  Component Value Date   HGBA1C 5.9 05/25/2019   disc imp of low glycemic diet and wt loss to prevent DM2

## 2019-06-12 NOTE — Assessment & Plan Note (Signed)
Scheduled annual screening mammogram Nl breast exam today  Encouraged monthly self exams   

## 2019-07-12 ENCOUNTER — Encounter: Payer: Self-pay | Admitting: Family Medicine

## 2019-07-12 ENCOUNTER — Other Ambulatory Visit: Payer: Self-pay

## 2019-07-12 ENCOUNTER — Ambulatory Visit (INDEPENDENT_AMBULATORY_CARE_PROVIDER_SITE_OTHER): Payer: Medicare Other | Admitting: Family Medicine

## 2019-07-12 ENCOUNTER — Ambulatory Visit (INDEPENDENT_AMBULATORY_CARE_PROVIDER_SITE_OTHER)
Admission: RE | Admit: 2019-07-12 | Discharge: 2019-07-12 | Disposition: A | Discharge status: Home / Self Care | Payer: Medicare Other | Source: Ambulatory Visit | Attending: Family Medicine | Admitting: Family Medicine

## 2019-07-12 VITALS — BP 140/68 | HR 94 | Temp 98.2°F | Ht 61.5 in | Wt 138.0 lb

## 2019-07-12 DIAGNOSIS — Z981 Arthrodesis status: Secondary | ICD-10-CM | POA: Diagnosis not present

## 2019-07-12 DIAGNOSIS — M5030 Other cervical disc degeneration, unspecified cervical region: Secondary | ICD-10-CM

## 2019-07-12 DIAGNOSIS — M5412 Radiculopathy, cervical region: Secondary | ICD-10-CM

## 2019-07-12 MED ORDER — TRAMADOL HCL 50 MG PO TABS: 50.0000 mg | ORAL_TABLET | Freq: Three times a day (TID) | ORAL | 0 refills | Status: AC | PRN

## 2019-07-12 MED ORDER — DEXAMETHASONE SODIUM PHOSPHATE 100 MG/10ML IJ SOLN
10.0000 mg | Freq: Once | INTRAMUSCULAR | Status: AC
  Administered 2019-07-12: 10 mg via INTRAMUSCULAR

## 2019-07-12 MED ORDER — PREDNISONE 20 MG PO TABS: ORAL_TABLET | ORAL | 0 refills | Status: DC

## 2019-07-12 NOTE — Progress Notes (Signed)
[place] Shimabukuro T. Lenea Bywater, MD Primary Care and [place] at [facility] [place], [address] Phone: [phone]  FAX: [place] - 85 y.o. female  MRN [id number]  Date of Birth: 04/26/1929  Visit Date: 07/12/2019  PCP: Abner Greenspan, MD  Referred by: Tower, Wynelle Fanny, MD  Chief Complaint  Patient presents with  . Shoulder Pain    Left   Subjective:   Linda Robinson is a 85 y.o. very pleasant female patient with Body mass index is 25.65 kg/m. who presents with the following:  She has a known 85 year old patient, and I saw her previously in the past in March 2020 for some cervical radiculopathy.  She actually has had 2 prior cervical fusions, and she still is having some recalcitrant pain.  When I saw her and March I did give her 10 days of prednisone.  Also gave her some Zanaflex.  She is here today for an evaluation and consultation.  Shoulder blade pain going down the arms. Tingling down the entire of her left arm.  When looking right left radiculopathy.    When to PT but only twice. From march until now had been better, now bad even worse tha before her neck fusion.  She is currently feeling terrible and in significant pain.  Dr. Rennis Harding in [place].  Past Medical History, Surgical History, Social History, Family History, Problem List, Medications, and Allergies have been reviewed and updated if relevant.  Patient Active Problem List   Diagnosis Date Noted  . Poor balance 06/12/2019  . Hearing loss of right ear 10/17/2018  . Screening mammogram, encounter for 05/23/2018  . Substernal chest pain 08/16/2017  . Atherosclerosis of aorta ([place]) 03/09/2017  . Weight loss 02/23/2017  . Routine general medical examination at a health care facility 07/05/2015  . Estrogen deficiency 07/05/2015  . Prediabetes 01/01/2015  . Lumbar spinal stenosis 11/13/2014  . Vertigo 10/05/2014  . Risk for falls  06/27/2014  . Low back pain on right side with sciatica 07/24/2013  . Edema 04/26/2013  . Encounter for Medicare annual wellness exam 04/11/2013  . GERD (gastroesophageal reflux disease)   . Bradycardia 05/19/2010  . Disorder of bone and cartilage 01/30/2009  . CONSTIPATION 11/01/2007  . Depression with anxiety 02/14/2007  . Hyperlipidemia LDL goal <130 02/04/2007  . Gout 02/04/2007  . Essential hypertension 02/04/2007  . DIVERTICULOSIS, COLON 02/04/2007  . Renal insufficiency 02/04/2007  . OSTEOARTHRITIS 02/04/2007  . [place] DISEASE 02/04/2007    Past Medical History:  Diagnosis Date  . Basal cell carcinoma    removed  . Chest pain    a. 02/2017 MV: EF 54%, small, distal anterior wall/apical infacrt (likely misregistration), no ischemia-->Low risk; b. 02/2017 Echo: EF 55-60%, mild MR, PASP 67mmHg.  Marland Kitchen Chronic back pain    stenosis  . Constipation    takes Colace daily  . Depression    takes Zoloft daily  . Diverticulosis of colon   . GERD (gastroesophageal reflux disease)    atypical chest pain  . Gout    hx of;was on Allopurinol but taken off 6 wks ago by medical MD  . History of diverticulitis of colon   . HLD (hyperlipidemia)    takes Pravastatin daily  . HTN (hypertension)    takes Amlodipine and carvedilol  . OA (osteoarthritis)    right knee  . Osteopenia    takes Calcium and Vit D daily  .  Vertigo    takes Antivert daily as needed    Past Surgical History:  Procedure Laterality Date  . APPENDECTOMY  at age 27  . BACK SURGERY    . CATARACT EXTRACTION Right 04/2006  . CHOLECYSTECTOMY    . COLONOSCOPY    . KNEE SURGERY Right    Medial meniscus tear  . LUMBAR LAMINECTOMY/DECOMPRESSION MICRODISCECTOMY Bilateral 11/13/2014   Procedure: Lumbar one-two Bilateral laminectomy and foraminotomy, Left Lumbar one-two microdiscectomy ;  Surgeon: Faythe Ghee, MD;  Location: [facility];  Service: Neurosurgery;  Laterality: Bilateral;  . NECK SURGERY      x 2;fusion  . PARTIAL COLECTOMY  09/1999   d/t diverticulitis  . TUBAL LIGATION      Social History   Socioeconomic History  . Marital status: Married    Spouse name: Not on file  . Number of children: Not on file  . Years of education: Not on file  . Highest education level: Not on file  Occupational History  . Occupation: Retired  Scientific laboratory technician  . Financial resource strain: Not hard at all  . Food insecurity    Worry: Never true    Inability: Never true  . Transportation needs    Medical: No    Non-medical: No  Tobacco Use  . Smoking status: Never Smoker  . Smokeless tobacco: Never Used  Substance and Sexual Activity  . Alcohol use: No    Alcohol/week: 0.0 standard drinks  . Drug use: Never  . Sexual activity: Not Currently    Birth control/protection: Post-menopausal  Lifestyle  . Physical activity    Days per week: 0 days    Minutes per session: 0 min  . Stress: Not at all  Relationships  . Social Herbalist on phone: Not on file    Gets together: Not on file    Attends religious service: Not on file    Active member of club or organization: Not on file    Attends meetings of clubs or organizations: Not on file    Relationship status: Not on file  . Intimate partner violence    Fear of current or ex partner: No    Emotionally abused: No    Physically abused: No    Forced sexual activity: No  Other Topics Concern  . Not on file  Social History Narrative  . Not on file    Family History  Problem Relation Age of Onset  . Cancer Father        throat and bladder  . Diabetes Father   . Hypertension Father   . Heart attack Father   . Stroke Mother   . Alzheimer's disease Mother   . Hypertension Mother     Allergies  Allergen Reactions  . Ace Inhibitors     REACTION: increased K+, increased creat.  . Bactrim [Sulfamethoxazole-Trimethoprim]     rash  . Cephalosporins     REACTION: reaction not known  . Colesevelam     REACTION:  constipation  . Hctz [Hydrochlorothiazide]     Makes her feel "bad"  . Losartan     Increased K  . Lovastatin     REACTION: doesn't work  . Raloxifene     REACTION: cramps  . Rosuvastatin     REACTION: myalgia  . Simvastatin     REACTION: myalgia    Medication list reviewed and updated in full in [place].  GEN: no acute illness or fever CV: No  chest pain or shortness of breath MSK: detailed above Neuro: neurological signs are described above ROS O/w per HPI  Objective:   BP 140/68   Pulse 94   Temp 98.2 F (36.8 C) (Temporal)   Ht 5' 1.5" (1.562 m)   Wt 138 lb (62.6 kg)   SpO2 96%   BMI 25.65 kg/m    GEN: WDWN, NAD, Non-toxic, Alert & Oriented x 3 HEENT: Atraumatic, Normocephalic.  Ears and Nose: No external deformity. EXTR: No clubbing/cyanosis/edema NEURO: Normal gait.  PSYCH: Normally interactive. Conversant. Not depressed or anxious appearing.  Calm demeanor.     GEN: WDWN, NAD, Non-toxic, Alert & Oriented x 3 HEENT: Atraumatic, Normocephalic.  Ears and Nose: No external deformity. EXTR: No clubbing/cyanosis/edema NEURO: Normal gait.  PSYCH: Normally interactive. Conversant. Not depressed or anxious appearing.  Calm demeanor.   Shoulder: B Inspection: No muscle wasting or winging Ecchymosis/edema: neg  AC joint, scapula, clavicle: minimally tender Abduction: full, 5/5 Flexion: full, 5/5 IR, full, lift-off: 5/5 ER at neutral: full, 5/5 AC crossover and compression: mild positive Neer: neg Hawkins: neg Drop Test: neg Empty Can: neg Supraspinatus insertion: NT Bicipital groove: NT Speed's: neg Yergason's: neg Sulcus sign: neg Scapular dyskinesis: none C5-T1 intact Sensation intact Grip 5/5   Patient has at least 65% loss of motion in all directions of her neck.  She turns her neck to the right she does have significant tingling that goes down the entire course of her arm.  Her strength is 5/5 and she is otherwise neurovascularly  intact.  Radiology: No results found.  Assessment and Plan:     ICD-10-CM   1. Left cervical radiculopathy  M54.12 DG Cervical Spine Complete    dexamethasone (DECADRON) injection 10 mg  2. History of fusion of cervical spine  Z98.1 DG Cervical Spine Complete  3. DDD (degenerative disc disease), cervical  M50.30    She is having some very severe radiculopathy and pain.  History of fusion.  I am going to give her some Decadron here in the office followed by with some prednisone for 14 days.  Tramadol as needed for pain.  If her symptoms do not recover in 7 days, then my plan would be to do an MRI of her neck, and follow-up with either physical medicine rehab or neurosurgery.  Follow-up: No follow-ups on file.  Meds ordered this encounter  Medications  . predniSONE (DELTASONE) 20 MG tablet    Sig: 2 tabs po for 7 days, then 1 tab po for 7 days    Dispense:  21 tablet    Refill:  0  . traMADol (ULTRAM) 50 MG tablet    Sig: Take 1 tablet (50 mg total) by mouth every 8 (eight) hours as needed for up to 5 days for moderate pain.    Dispense:  20 tablet    Refill:  0  . dexamethasone (DECADRON) injection 10 mg   Orders Placed This Encounter  Procedures  . DG Cervical Spine Complete    Signed,  Arianne Klinge T. Jewel Venditto, MD   Outpatient Encounter Medications as of 07/12/2019  Medication Sig  . amLODipine (NORVASC) 10 MG tablet Take 1 tablet (10 mg total) by mouth daily.  Marland Kitchen aspirin EC 81 MG tablet Take 1 tablet (81 mg total) by mouth daily.  . Calcium Carbonate-Vitamin D (CALCIUM 500 + D) 500-125 MG-UNIT TABS Take by mouth.  . Cranberry 250 MG CAPS Take 1 capsule by mouth daily.    Marland Kitchen docusate sodium (COLACE) 100  MG capsule Take 100-200 mg by mouth 2 (two) times daily. 100 mg in the morning and 200 mg at night  . famotidine (PEPCID) 20 MG tablet Take 1 tablet (20 mg total) by mouth 2 (two) times daily.  . fish oil-omega-3 fatty acids 1000 MG capsule Take 1 g by mouth at bedtime.   .  furosemide (LASIX) 20 MG tablet TAKE 1 TABLET DAILY AS NEEDED FOR SWELLING  . hydrALAZINE (APRESOLINE) 10 MG tablet Take 1 tablet (10 mg total) by mouth daily.  . meclizine (ANTIVERT) 25 MG tablet Take 25 mg by mouth 4 (four) times daily as needed for dizziness.   Marland Kitchen omeprazole (PRILOSEC) 20 MG capsule Take 1 capsule (20 mg total) by mouth daily.  . pravastatin (PRAVACHOL) 20 MG tablet Take 1 tablet (20 mg total) by mouth daily.  . sertraline (ZOLOFT) 100 MG tablet Take 1 tablet (100 mg total) by mouth daily.  . Tetrahydrozoline HCl (EYE DROPS OP) Apply 1-2 drops to eye as needed (for dry eye).  . predniSONE (DELTASONE) 20 MG tablet 2 tabs po for 7 days, then 1 tab po for 7 days  . traMADol (ULTRAM) 50 MG tablet Take 1 tablet (50 mg total) by mouth every 8 (eight) hours as needed for up to 5 days for moderate pain.  . [EXPIRED] dexamethasone (DECADRON) injection 10 mg    No facility-administered encounter medications on file as of 07/12/2019.

## 2019-07-12 NOTE — Patient Instructions (Signed)
Can you Call me in 7 days - PLEASE IF NOT BETTER

## 2019-07-19 ENCOUNTER — Encounter: Payer: Self-pay | Admitting: Family Medicine

## 2019-07-31 ENCOUNTER — Telehealth: Payer: Self-pay

## 2019-07-31 ENCOUNTER — Encounter: Payer: Self-pay | Admitting: Family Medicine

## 2019-07-31 NOTE — Telephone Encounter (Signed)
Pt left v/m; pt seen 07/12/19 for neck and arm pain. Pt finished the steroids on 07/26/19 and by 07/28/19 the neck and arm pain was as bad as ever. Pt request cb with what else can be done. Per 07/12/19 note if do not recover from symptoms in 7 days, then plan MRI of neck and referral. Pt request cb.

## 2019-08-01 ENCOUNTER — Other Ambulatory Visit: Payer: Self-pay | Admitting: Family Medicine

## 2019-08-01 DIAGNOSIS — Z981 Arthrodesis status: Secondary | ICD-10-CM

## 2019-08-01 DIAGNOSIS — M5030 Other cervical disc degeneration, unspecified cervical region: Secondary | ICD-10-CM

## 2019-08-01 DIAGNOSIS — M5412 Radiculopathy, cervical region: Secondary | ICD-10-CM

## 2019-08-01 DIAGNOSIS — M961 Postlaminectomy syndrome, not elsewhere classified: Secondary | ICD-10-CM

## 2019-08-01 MED ORDER — PREDNISONE 20 MG PO TABS: ORAL_TABLET | ORAL | 0 refills | Status: DC

## 2019-08-01 NOTE — Progress Notes (Signed)
consults

## 2019-08-02 NOTE — Telephone Encounter (Signed)
Med was d/c on 06/12/19 and isn't on med list anymore. please advise

## 2019-08-02 NOTE — Telephone Encounter (Signed)
Please call pt and clarify this- is she taking it?

## 2019-08-10 ENCOUNTER — Telehealth: Payer: Self-pay

## 2019-08-10 NOTE — Telephone Encounter (Signed)
Pt said on 08/01/19 prescribed prednisone 20 mg taper; pt said now she is having weakness, depression,stomach pain and "can hardly make it". Pt wants to know if can stop prednisone; it has never done her this way.pt said on 08/07/19 started with generalized weakness especially in lt leg;pt had to hold to things and cane to walk. Pt said she has had depression before but this time pt has problems in the family and she just wants to sit around all the time and cry. Pt read the possible side effects of prednisone and depression is listed. Pt having lower mid abd pain that does not radiate anywhere; pain level earlier this morning 4-5; pt is not having any abd pain now. Pt took some Mylanta. Pt said when she said it is hard for her to make it refers to her doing her usual routine at home. Pt has not taken prednisone today and has been doing usual routine. Left arm does continue to hurt which pt knows is coming from her neck. Pt denies SOB but I can hear pt breathing into phone as though she was SOB. No diarrhea. Pt has constipation daily. Last BM this morning. Pt bruises very easily for 1-2 yrs. Pt said right now pt is sitting in chair and nothing is bothering her. Pt said she noted above symptoms could all be related to prednisone and pt wants to know if she can stop prednisone now. Pt has 5 more prednisone tabs to take 1 daily for 5 days. Pt does not think she needs to go to UC and wants cb with answer to question. Pt also wants to know if could get MRI before 08/22/19 because pt wants to get it over with and pt continues with pain in arm and neck. Pt request cb.

## 2019-08-10 NOTE — Telephone Encounter (Signed)
OK to stop prednisone as possible SE.  MRI  Before 11/24.. will leave up to Dr. Lorelei Pont who saw pt.  PCP [employer]

## 2019-08-10 NOTE — Telephone Encounter (Signed)
Linda Robinson notified okay to stop prednisone per Dr. Diona Browner. Patient states MRI has been moved to Saturday 08/12/2019.

## 2019-08-12 ENCOUNTER — Other Ambulatory Visit: Payer: Self-pay | Admitting: Family Medicine

## 2019-08-12 ENCOUNTER — Other Ambulatory Visit: Payer: Self-pay

## 2019-08-12 ENCOUNTER — Ambulatory Visit
Admission: RE | Admit: 2019-08-12 | Discharge: 2019-08-12 | Disposition: A | Discharge status: Home / Self Care | Payer: Medicare Other | Source: Ambulatory Visit | Attending: Family Medicine | Admitting: Family Medicine

## 2019-08-12 DIAGNOSIS — M961 Postlaminectomy syndrome, not elsewhere classified: Secondary | ICD-10-CM

## 2019-08-12 DIAGNOSIS — Z981 Arthrodesis status: Secondary | ICD-10-CM

## 2019-08-12 DIAGNOSIS — M5412 Radiculopathy, cervical region: Secondary | ICD-10-CM

## 2019-08-12 DIAGNOSIS — M5030 Other cervical disc degeneration, unspecified cervical region: Secondary | ICD-10-CM

## 2019-08-13 NOTE — Telephone Encounter (Signed)
Would you check on her?  She just had her MRI yesterday, and she should be having follow-up set up with her spine surgeon.

## 2019-08-14 NOTE — Telephone Encounter (Signed)
Feeling better with still ongoing pain, see note from results

## 2019-08-14 NOTE — Telephone Encounter (Signed)
What about her MRI results.  Am I suppose to discuss that with her because she is probably going to ask what the results were?

## 2019-08-22 ENCOUNTER — Other Ambulatory Visit: Payer: Medicare Other

## 2019-09-29 ENCOUNTER — Encounter: Payer: Self-pay | Admitting: Family Medicine

## 2019-10-20 ENCOUNTER — Other Ambulatory Visit: Payer: Self-pay | Admitting: Rehabilitation

## 2019-10-20 DIAGNOSIS — M5412 Radiculopathy, cervical region: Secondary | ICD-10-CM

## 2019-11-11 ENCOUNTER — Other Ambulatory Visit: Payer: Self-pay

## 2019-11-11 ENCOUNTER — Ambulatory Visit
Admission: RE | Admit: 2019-11-11 | Discharge: 2019-11-11 | Disposition: A | Discharge status: Home / Self Care | Payer: Medicare Other | Source: Ambulatory Visit | Attending: Rehabilitation | Admitting: Rehabilitation

## 2019-11-11 DIAGNOSIS — M5412 Radiculopathy, cervical region: Secondary | ICD-10-CM

## 2019-11-11 MED ORDER — GADOBENATE DIMEGLUMINE 529 MG/ML IV SOLN
6.0000 mL | Freq: Once | INTRAVENOUS | Status: AC | PRN
  Administered 2019-11-11: 16:00:00 6 mL via INTRAVENOUS

## 2019-11-17 ENCOUNTER — Encounter: Payer: Self-pay | Admitting: Family Medicine

## 2019-11-17 ENCOUNTER — Other Ambulatory Visit: Payer: Self-pay

## 2019-11-17 ENCOUNTER — Ambulatory Visit (INDEPENDENT_AMBULATORY_CARE_PROVIDER_SITE_OTHER): Payer: Medicare Other | Admitting: Family Medicine

## 2019-11-17 VITALS — BP 128/76 | HR 88 | Temp 96.8°F | Ht 61.5 in | Wt 132.2 lb

## 2019-11-17 DIAGNOSIS — R42 Dizziness and giddiness: Secondary | ICD-10-CM | POA: Diagnosis not present

## 2019-11-17 DIAGNOSIS — F4321 Adjustment disorder with depressed mood: Secondary | ICD-10-CM | POA: Insufficient documentation

## 2019-11-17 DIAGNOSIS — F418 Other specified anxiety disorders: Secondary | ICD-10-CM | POA: Diagnosis not present

## 2019-11-17 DIAGNOSIS — I700 Atherosclerosis of aorta: Secondary | ICD-10-CM | POA: Diagnosis not present

## 2019-11-17 DIAGNOSIS — H9190 Unspecified hearing loss, unspecified ear: Secondary | ICD-10-CM | POA: Insufficient documentation

## 2019-11-17 DIAGNOSIS — R7303 Prediabetes: Secondary | ICD-10-CM

## 2019-11-17 DIAGNOSIS — I10 Essential (primary) hypertension: Secondary | ICD-10-CM | POA: Diagnosis not present

## 2019-11-17 DIAGNOSIS — H906 Mixed conductive and sensorineural hearing loss, bilateral: Secondary | ICD-10-CM

## 2019-11-17 DIAGNOSIS — M4802 Spinal stenosis, cervical region: Secondary | ICD-10-CM

## 2019-11-17 NOTE — Patient Instructions (Signed)
Keep talking to people about grief Linda Robinson your pastor Take care of yourself  Use cane or walker if you feel off balance  Follow up with Dr Richardson Landry as planned  When you check out-please sign a release so we can get his notes   Good luck getting your surgery scheduled

## 2019-11-17 NOTE — Progress Notes (Signed)
Subjective:    Patient ID: Linda Robinson, female    DOB: April 22, 1929, 86 y.o.   MRN: [id number]  This visit occurred during the SARS-CoV-2 public health emergency.  Safety protocols were in place, including screening questions prior to the visit, additional usage of staff PPE, and extensive cleaning of exam room while observing appropriate contact time as indicated for disinfecting solutions.    HPI Pt presents for f/u of chronic health problems- in prep for neck surgery  Also ear issues /making her dizzy   Wt Readings from Last 3 Encounters:  11/17/19 132 lb 4 oz (60 kg)  07/12/19 138 lb (62.6 kg)  06/12/19 140 lb (63.5 kg)   24.58 kg/m    Saw Dr Richardson Landry (ENT) She lost hearing in R ear in nov after a cold   Saw him most recently a week ago Wednesday  Hearing a little improved in R ear -but will need a hearing aide  He px steroids to help her hearing   Her head feels "funny" - perhaps a pressure feeling (Dr Richardson Landry thought it was an inner ear problem) Not fully dizzy  Got hot once when working and almost passed out   Steroids have not helped her head sensation  Took the last dose Monday    No congestion at all   This am she felt weak and jittery -? If prednisone side effect  Feeling better now  Had someone drive her (she does not drive)   Sees ENT in a week for a follow up   bp is stable today  No cp or palpitations or headaches or edema  No side effects to medicines  BP Readings from Last 3 Encounters:  11/17/19 128/76  07/12/19 140/68  06/12/19 116/62     Pulse Readings from Last 3 Encounters:  11/17/19 88  07/12/19 94  06/12/19 71        She lost her husband recently - living alone and it is tough  Going through grief (married for 1 years)  She takes sertraline for anxiety and depression  Feels down -it is hard  Talks to her kids and also some friends who call  A lot of support  Also from her pastor- some counseling    She has h/o neck fusion  in the past  Recent MRI: [facility] (Accession [id number]) (Order [id number]) Imaging Date: 11/11/2019 Department: [facility] [place] Released By: Sherril Cong Authorizing: Zonia Kief, MD  Exam Status  Status  Final [99]  PACS Intelerad Image Link  Show images for MR CERVICAL SPINE W WO CONTRAST  Study Result  CLINICAL DATA:  Cervical radiculitis. Left arm pain and weakness. Remote cervical fusion.  Creatinine was obtained on site at [facility] at [address]  Results: Creatinine 1.2 mg/dL.  EXAM: MRI CERVICAL SPINE WITHOUT AND WITH CONTRAST  TECHNIQUE: Multiplanar and multiecho pulse sequences of the cervical spine, to include the craniocervical junction and cervicothoracic junction, were obtained without and with intravenous contrast.  CONTRAST:  47mL MULTIHANCE GADOBENATE DIMEGLUMINE 529 MG/ML IV SOLN  COMPARISON:  08/12/2019  FINDINGS: Alignment: Unchanged cervical spine straightening and minimal retrolisthesis of C3 on C4.  Vertebrae: No fracture or suspicious marrow lesion. C4-C7 ACDF with anterior fusion plate and screws in place at C6-7. Solid interbody arthrodesis at C4-5 and C5-6. Solid interbody arthrodesis at C6-7 as well based on previous radiographs.  Cord: Normal signal.  Posterior Fossa, vertebral arteries, paraspinal  tissues: Unremarkable.  Disc levels:  C2-3: Moderate disc space narrowing. Disc bulging, uncovertebral spurring, and mild facet arthrosis result in mild right neural foraminal stenosis without spinal stenosis, unchanged.  C3-4: Severe disc space narrowing. Broad-based posterior disc osteophyte complex and mild-to-moderate facet arthrosis result in mild spinal stenosis and moderate bilateral neural foraminal stenosis, unchanged.  C4-5: Anterior fusion. No stenosis.  C5-6: Anterior fusion. No stenosis.  C6-7: Anterior fusion. Osteophytic  ridging greater to the right results in borderline to mild spinal stenosis and mild right neural foraminal stenosis, unchanged.  C7-T1: Moderate disc space narrowing. Disc bulging, asymmetric left uncovertebral spurring, and moderate facet arthrosis result in mild right and severe left neural foraminal stenosis with potential left C8 nerve root impingement, unchanged. No significant spinal stenosis.  T1-2: Moderate disc space narrowing. Disc bulging and spurring result in moderate bilateral neural foraminal stenosis and borderline spinal stenosis, unchanged.  T2-3: Only imaged sagittally. Disc bulging eccentric to the right and severe right and mild left facet arthrosis result in borderline spinal stenosis and severe right neural foraminal stenosis, unchanged.  IMPRESSION: 1. Unchanged advanced cervical disc and facet degeneration. 2. Mild spinal stenosis and moderate bilateral neural foraminal stenosis at C3-4. 3. Severe left neural foraminal stenosis at C7-T1. 4. Moderate to severe neural foraminal stenosis in the upper thoracic spine. 5. C4-C7 anterior fusion without residual compressive stenosis.   Electronically Signed   By: Logan Bores M.D.   On: 11/12/2019 06:34   Dr Patrice Paradise wants to do surgery on a "bone spur"  Pain is in her L arm/hand - it is pretty bad /miserable  Having trouble with grip    Patient Active Problem List   Diagnosis Date Noted  . Grief reaction 11/17/2019  . Cervical stenosis of spinal canal 11/17/2019  . Hearing loss 11/17/2019  . Poor balance 06/12/2019  . Hearing loss of right ear 10/17/2018  . Screening mammogram, encounter for 05/23/2018  . Substernal chest pain 08/16/2017  . Atherosclerosis of aorta ([place]) 03/09/2017  . Weight loss 02/23/2017  . Dizziness 05/19/2016  . Routine general medical examination at a health care facility 07/05/2015  . Estrogen deficiency 07/05/2015  . Prediabetes 01/01/2015  . Lumbar spinal stenosis  11/13/2014  . Vertigo 10/05/2014  . Risk for falls 06/27/2014  . Low back pain on right side with sciatica 07/24/2013  . Edema 04/26/2013  . Encounter for Medicare annual wellness exam 04/11/2013  . GERD (gastroesophageal reflux disease)   . Bradycardia 05/19/2010  . Disorder of bone and cartilage 01/30/2009  . CONSTIPATION 11/01/2007  . Depression with anxiety 02/14/2007  . Hyperlipidemia LDL goal <130 02/04/2007  . Gout 02/04/2007  . Essential hypertension 02/04/2007  . DIVERTICULOSIS, COLON 02/04/2007  . Renal insufficiency 02/04/2007  . OSTEOARTHRITIS 02/04/2007  . [place] DISEASE 02/04/2007   Past Medical History:  Diagnosis Date  . Basal cell carcinoma    removed  . Chest pain    a. 02/2017 MV: EF 54%, small, distal anterior wall/apical infacrt (likely misregistration), no ischemia-->Low risk; b. 02/2017 Echo: EF 55-60%, mild MR, PASP 1mmHg.  Marland Kitchen Chronic back pain    stenosis  . Constipation    takes Colace daily  . Depression    takes Zoloft daily  . Diverticulosis of colon   . GERD (gastroesophageal reflux disease)    atypical chest pain  . Gout    hx of;was on Allopurinol but taken off 6 wks ago by medical MD  . History of diverticulitis of colon   .  HLD (hyperlipidemia)    takes Pravastatin daily  . HTN (hypertension)    takes Amlodipine and carvedilol  . OA (osteoarthritis)    right knee  . Osteopenia    takes Calcium and Vit D daily  . Vertigo    takes Antivert daily as needed   Past Surgical History:  Procedure Laterality Date  . APPENDECTOMY  at age 76  . BACK SURGERY    . CATARACT EXTRACTION Right 04/2006  . CHOLECYSTECTOMY    . COLONOSCOPY    . KNEE SURGERY Right    Medial meniscus tear  . LUMBAR LAMINECTOMY/DECOMPRESSION MICRODISCECTOMY Bilateral 11/13/2014   Procedure: Lumbar one-two Bilateral laminectomy and foraminotomy, Left Lumbar one-two microdiscectomy ;  Surgeon: Faythe Ghee, MD;  Location: [facility];  Service:  Neurosurgery;  Laterality: Bilateral;  . NECK SURGERY     x 2;fusion  . PARTIAL COLECTOMY  09/1999   d/t diverticulitis  . TUBAL LIGATION     Social History   Tobacco Use  . Smoking status: Never Smoker  . Smokeless tobacco: Never Used  Substance Use Topics  . Alcohol use: No    Alcohol/week: 0.0 standard drinks  . Drug use: Never   Family History  Problem Relation Age of Onset  . Cancer Father        throat and bladder  . Diabetes Father   . Hypertension Father   . Heart attack Father   . Stroke Mother   . Alzheimer's disease Mother   . Hypertension Mother    Allergies  Allergen Reactions  . Ace Inhibitors     REACTION: increased K+, increased creat.  . Bactrim [Sulfamethoxazole-Trimethoprim]     rash  . Cephalosporins     REACTION: reaction not known  . Colesevelam     REACTION: constipation  . Hctz [Hydrochlorothiazide]     Makes her feel "bad"  . Losartan     Increased K  . Lovastatin     REACTION: doesn't work  . Raloxifene     REACTION: cramps  . Rosuvastatin     REACTION: myalgia  . Simvastatin     REACTION: myalgia   Current Outpatient Medications on File Prior to Visit  Medication Sig Dispense Refill  . amLODipine (NORVASC) 10 MG tablet Take 1 tablet (10 mg total) by mouth daily. 90 tablet 3  . aspirin EC 81 MG tablet Take 1 tablet (81 mg total) by mouth daily. 90 tablet 3  . Calcium Carbonate-Vitamin D (CALCIUM 500 + D) 500-125 MG-UNIT TABS Take by mouth.    . Cranberry 250 MG CAPS Take 1 capsule by mouth daily.      Marland Kitchen docusate sodium (COLACE) 100 MG capsule Take 100-200 mg by mouth 2 (two) times daily. 100 mg in the morning and 200 mg at night    . famotidine (PEPCID) 20 MG tablet Take 1 tablet (20 mg total) by mouth 2 (two) times daily. 180 tablet 3  . fish oil-omega-3 fatty acids 1000 MG capsule Take 1 g by mouth at bedtime.     . furosemide (LASIX) 20 MG tablet TAKE 1 TABLET DAILY AS NEEDED FOR SWELLING 90 tablet 3  . hydrALAZINE (APRESOLINE)  10 MG tablet Take 1 tablet (10 mg total) by mouth daily. 90 tablet 3  . meclizine (ANTIVERT) 25 MG tablet Take 25 mg by mouth 4 (four) times daily as needed for dizziness.     Marland Kitchen omeprazole (PRILOSEC) 20 MG capsule Take 1 capsule (20 mg total) by mouth  daily. 90 capsule 3  . pravastatin (PRAVACHOL) 20 MG tablet Take 1 tablet (20 mg total) by mouth daily. 90 tablet 3  . sertraline (ZOLOFT) 100 MG tablet Take 1 tablet (100 mg total) by mouth daily. 90 tablet 3  . Tetrahydrozoline HCl (EYE DROPS OP) Apply 1-2 drops to eye as needed (for dry eye).     No current facility-administered medications on file prior to visit.    Review of Systems  Constitutional: Positive for fatigue. Negative for activity change, appetite change, fever and unexpected weight change.  HENT: Positive for hearing loss. Negative for congestion, ear discharge, ear pain, nosebleeds, postnasal drip, rhinorrhea, sinus pressure and sore throat.   Eyes: Negative for pain, redness and visual disturbance.  Respiratory: Negative for cough, shortness of breath and wheezing.   Cardiovascular: Negative for chest pain and palpitations.  Gastrointestinal: Negative for abdominal pain, blood in stool, constipation and diarrhea.  Endocrine: Negative for polydipsia and polyuria.  Genitourinary: Negative for dysuria, frequency and urgency.  Musculoskeletal: Positive for arthralgias and neck pain. Negative for back pain and myalgias.  Skin: Negative for pallor and rash.  Allergic/Immunologic: Negative for environmental allergies.  Neurological: Positive for numbness. Negative for dizziness, syncope and headaches.       Altered sensation in L hand from her cervical stenosis, some weakness as well   Hematological: Negative for adenopathy. Does not bruise/bleed easily.  Psychiatric/Behavioral: Negative for decreased concentration and dysphoric mood. The patient is not nervous/anxious.        Objective:   Physical Exam Constitutional:       General: She is not in acute distress.    Appearance: Normal appearance. She is well-developed and normal weight. She is not ill-appearing or diaphoretic.  HENT:     Head: Normocephalic and atraumatic.     Right Ear: Tympanic membrane, ear canal and external ear normal. There is no impacted cerumen.     Left Ear: Tympanic membrane, ear canal and external ear normal. There is no impacted cerumen.     Nose: Nose normal.     Mouth/Throat:     Mouth: Mucous membranes are moist.     Pharynx: Oropharynx is clear.  Eyes:     General: No scleral icterus.    Conjunctiva/sclera: Conjunctivae normal.     Pupils: Pupils are equal, round, and reactive to light.  Neck:     Thyroid: No thyromegaly.     Vascular: No carotid bruit or JVD.  Cardiovascular:     Rate and Rhythm: Normal rate and regular rhythm.     Heart sounds: Normal heart sounds. No gallop.   Pulmonary:     Effort: Pulmonary effort is normal. No respiratory distress.     Breath sounds: Normal breath sounds. No wheezing or rales.  Abdominal:     General: Bowel sounds are normal. There is no distension or abdominal bruit.     Palpations: Abdomen is soft. There is no mass.     Tenderness: There is no abdominal tenderness.  Musculoskeletal:     Cervical back: Neck supple. Tenderness present. No rigidity.     Right lower leg: No edema.     Left lower leg: No edema.  Lymphadenopathy:     Cervical: No cervical adenopathy.  Skin:    General: Skin is warm and dry.     Coloration: Skin is not pale.     Findings: No erythema or rash.  Neurological:     Mental Status: She is alert.  Cranial Nerves: Cranial nerves are intact.     Sensory: Sensory deficit present.     Motor: Weakness and atrophy present. No tremor, abnormal muscle tone or pronator drift.     Coordination: Coordination is intact. Romberg sign negative.     Gait: Gait is intact.     Deep Tendon Reflexes: Reflexes are normal and symmetric.     Comments: Dec sensation in  L hand along with decreased grip strength (some atrophy)  No focal cerebellar signs            Assessment & Plan:   Problem List Items Addressed This Visit      Cardiovascular and Mediastinum   Essential hypertension (Chronic)    bp in fair control at this time  BP Readings from Last 1 Encounters:  11/17/19 128/76   No changes needed Most recent labs reviewed  Disc lifstyle change with low sodium diet and exercise        Relevant Orders   CBC with Differential/Platelet (Completed)   Comprehensive metabolic panel (Completed)   TSH (Completed)   Atherosclerosis of aorta (HCC)    No clinical changes  Continue to work on good BP and cholesterol control         Nervous and Auditory   Hearing loss    For f/u with Dr Richardson Landry (ENT) to discuss hearing aides        Other   Depression with anxiety    Worse with recent loss of husband  Continues sertraline-pt thinks it helps  No SI  Has a lot of support      Prediabetes    A1C today- more dizzy  Appetite down after loss of husband       Relevant Orders   Hemoglobin A1c (Completed)   Dizziness - Primary    Addressed with ENT/ ? If inner ear related No improvement with prednisone (possibly worse)  No focal neuro symptoms  TMs appear nl today Has just been through loss of husband/ traumatic -? If anxiety adds  Disc fall prevention  Lab today        Grief reaction    Pt states she has much support Lost husband unexpectedly Declines counseling at this time Does have pastoral support  Reviewed stressors/ coping techniques/symptoms/ support sources/ tx options and side effects in detail today Enc good self care  Continue sertraline 100 mg daily      Cervical stenosis of spinal canal    Now worse with some loss of strength and sensation in L hand  Reviewed last MRI  H/o fusion in the past  Is planning surgery with Dr Patrice Paradise -facet arthrosis and worse spinal stenosis

## 2019-11-18 LAB — CBC WITH DIFFERENTIAL/PLATELET
Absolute Monocytes: 1024 cells/uL — ABNORMAL HIGH (ref 200–950)
Basophils Absolute: 26 cells/uL (ref 0–200)
Basophils Relative: 0.2 %
Eosinophils Absolute: 179 cells/uL (ref 15–500)
Eosinophils Relative: 1.4 %
HCT: 42.9 % (ref 35.0–45.0)
Hemoglobin: 14.2 g/dL (ref 11.7–15.5)
Lymphs Abs: 5696 cells/uL — ABNORMAL HIGH (ref 850–3900)
MCH: 28.7 pg (ref 27.0–33.0)
MCHC: 33.1 g/dL (ref 32.0–36.0)
MCV: 86.8 fL (ref 80.0–100.0)
MPV: 10.6 fL (ref 7.5–12.5)
Monocytes Relative: 8 %
Neutro Abs: 5875 cells/uL (ref 1500–7800)
Neutrophils Relative %: 45.9 %
Platelets: 306 10*3/uL (ref 140–400)
RBC: 4.94 10*6/uL (ref 3.80–5.10)
RDW: 12.6 % (ref 11.0–15.0)
Total Lymphocyte: 44.5 %
WBC: 12.8 10*3/uL — ABNORMAL HIGH (ref 3.8–10.8)

## 2019-11-18 LAB — HEMOGLOBIN A1C
Hgb A1c MFr Bld: 5.3 % of total Hgb (ref <5.7)
Mean Plasma Glucose: 105 (calc)
eAG (mmol/L): 5.8 (calc)

## 2019-11-18 LAB — COMPREHENSIVE METABOLIC PANEL
AG Ratio: 2.1 (calc) (ref 1.0–2.5)
ALT: 9 U/L (ref 6–29)
AST: 14 U/L (ref 10–35)
Albumin: 4.1 g/dL (ref 3.6–5.1)
Alkaline phosphatase (APISO): 56 U/L (ref 37–153)
BUN/Creatinine Ratio: 27 (calc) — ABNORMAL HIGH (ref 6–22)
BUN: 26 mg/dL — ABNORMAL HIGH (ref 7–25)
CO2: 31 mmol/L (ref 20–32)
Calcium: 9.4 mg/dL (ref 8.6–10.4)
Chloride: 101 mmol/L (ref 98–110)
Creat: 0.98 mg/dL — ABNORMAL HIGH (ref 0.60–0.88)
Globulin: 2 g/dL (calc) (ref 1.9–3.7)
Glucose, Bld: 146 mg/dL — ABNORMAL HIGH (ref 65–99)
Potassium: 3.5 mmol/L (ref 3.5–5.3)
Sodium: 145 mmol/L (ref 135–146)
Total Bilirubin: 0.6 mg/dL (ref 0.2–1.2)
Total Protein: 6.1 g/dL (ref 6.1–8.1)

## 2019-11-18 LAB — TSH: TSH: 1.68 mIU/L (ref 0.40–4.50)

## 2019-11-19 NOTE — Assessment & Plan Note (Signed)
Addressed with ENT/ ? If inner ear related No improvement with prednisone (possibly worse)  No focal neuro symptoms  TMs appear nl today Has just been through loss of husband/ traumatic -? If anxiety adds  Disc fall prevention  Lab today

## 2019-11-19 NOTE — Assessment & Plan Note (Signed)
Worse with recent loss of husband  Continues sertraline-pt thinks it helps  No SI  Has a lot of support

## 2019-11-19 NOTE — Assessment & Plan Note (Signed)
bp in fair control at this time  BP Readings from Last 1 Encounters:  11/17/19 128/76   No changes needed Most recent labs reviewed  Disc lifstyle change with low sodium diet and exercise

## 2019-11-19 NOTE — Assessment & Plan Note (Signed)
For f/u with Dr Richardson Landry (ENT) to discuss hearing aides

## 2019-11-19 NOTE — Assessment & Plan Note (Signed)
No clinical changes  Continue to work on good BP and cholesterol control

## 2019-11-19 NOTE — Assessment & Plan Note (Signed)
Now worse with some loss of strength and sensation in L hand  Reviewed last MRI  H/o fusion in the past  Is planning surgery with Dr Patrice Paradise -facet arthrosis and worse spinal stenosis

## 2019-11-19 NOTE — Assessment & Plan Note (Signed)
Pt states she has much support Lost husband unexpectedly Declines counseling at this time Does have pastoral support  Reviewed stressors/ coping techniques/symptoms/ support sources/ tx options and side effects in detail today Enc good self care  Continue sertraline 100 mg daily

## 2019-11-19 NOTE — Assessment & Plan Note (Signed)
A1C today- more dizzy  Appetite down after loss of husband

## 2019-11-20 ENCOUNTER — Encounter: Payer: Self-pay | Admitting: *Deleted

## 2019-11-23 ENCOUNTER — Telehealth: Payer: Self-pay | Admitting: Family Medicine

## 2019-11-23 NOTE — Telephone Encounter (Signed)
They faxed [place] a surgical clearance form and Dr. Glori Bickers completed it and I faxed it back yesterday with her last OV note from 11/17/19. Called pt and advised her Dr. Glori Bickers has cleared her for surgery and we have filled out form an sent it back to them already. Pt verbalized understanding

## 2019-11-23 NOTE — Telephone Encounter (Signed)
Patient called.  Patient was given a clearance form for neck surgery by Dr.Max Patrice Paradise. Patient saw Dr.Tower on 11/17/19.  Does patient need an appointment for surgical clearance?

## 2019-12-26 ENCOUNTER — Telehealth: Payer: Self-pay

## 2019-12-26 NOTE — Telephone Encounter (Signed)
I think that is fine.

## 2019-12-26 NOTE — Telephone Encounter (Signed)
Pt left v/m that she had bone spur in neck surgery on 12/12/2019. Pt has covid vaccine scheduled for 12/27/19 and Dr Towanda Malkin PA said OK to get covid vaccine after waiting 1 wk (? After surgery). Pt wants to know if Dr Glori Bickers thinks it is ok for pt to get a covid vaccine on 12/27/19 after having surgery on 12/12/19. Pt request cb.

## 2019-12-27 ENCOUNTER — Other Ambulatory Visit: Payer: Self-pay | Admitting: Family Medicine

## 2019-12-27 NOTE — Addendum Note (Signed)
Addended by: Tammi Sou on: 12/27/2019 02:25 PM   Modules accepted: Orders

## 2019-12-27 NOTE — Telephone Encounter (Signed)
Pt notified of Dr. Tower's comments  

## 2019-12-27 NOTE — Telephone Encounter (Signed)
Pt called [facility] to have her prescriptions transferred to them and they told her she had to request them to be sent through our office. She is requesting the following to be sent to [facility] with automatic refills if possible:  amLODipine Docusate Sodium Furosemide hydrALAZINE Omeprazole Pravastatin Sertraline

## 2019-12-27 NOTE — Telephone Encounter (Signed)
CPE scheduled 06/12/20, will route to PCP for approval

## 2019-12-27 NOTE — Telephone Encounter (Signed)
Please refill to get to her visit.   Thanks

## 2019-12-28 MED ORDER — DOCUSATE SODIUM 100 MG PO CAPS: 100.0000 mg | ORAL_CAPSULE | Freq: Two times a day (BID) | ORAL | 1 refills | Status: AC

## 2019-12-28 MED ORDER — FUROSEMIDE 20 MG PO TABS: ORAL_TABLET | ORAL | 1 refills | Status: DC

## 2019-12-28 MED ORDER — OMEPRAZOLE 20 MG PO CPDR: 20.0000 mg | DELAYED_RELEASE_CAPSULE | Freq: Every day | ORAL | 1 refills | Status: DC

## 2019-12-28 MED ORDER — HYDRALAZINE HCL 10 MG PO TABS: 10.0000 mg | ORAL_TABLET | Freq: Every day | ORAL | 1 refills | Status: DC

## 2019-12-28 MED ORDER — SERTRALINE HCL 100 MG PO TABS: 100.0000 mg | ORAL_TABLET | Freq: Every day | ORAL | 1 refills | Status: DC

## 2019-12-28 MED ORDER — AMLODIPINE BESYLATE 10 MG PO TABS: 10.0000 mg | ORAL_TABLET | Freq: Every day | ORAL | 1 refills | Status: DC

## 2019-12-28 MED ORDER — PRAVASTATIN SODIUM 20 MG PO TABS: 20.0000 mg | ORAL_TABLET | Freq: Every day | ORAL | 1 refills | Status: DC

## 2019-12-28 NOTE — Addendum Note (Signed)
Addended by: Tammi Sou on: 12/28/2019 09:09 AM   Modules accepted: Orders

## 2019-12-28 NOTE — Telephone Encounter (Signed)
Rxs sent

## 2020-01-02 ENCOUNTER — Telehealth: Payer: Self-pay | Admitting: Family Medicine

## 2020-01-02 NOTE — Telephone Encounter (Signed)
error 

## 2020-01-23 ENCOUNTER — Other Ambulatory Visit: Payer: Self-pay

## 2020-01-23 ENCOUNTER — Ambulatory Visit (INDEPENDENT_AMBULATORY_CARE_PROVIDER_SITE_OTHER): Payer: Medicare Other | Admitting: Family Medicine

## 2020-01-23 ENCOUNTER — Encounter: Payer: Self-pay | Admitting: Family Medicine

## 2020-01-23 VITALS — BP 130/70 | HR 67 | Temp 97.9°F | Resp 16 | Wt 135.4 lb

## 2020-01-23 DIAGNOSIS — M4802 Spinal stenosis, cervical region: Secondary | ICD-10-CM

## 2020-01-23 DIAGNOSIS — H906 Mixed conductive and sensorineural hearing loss, bilateral: Secondary | ICD-10-CM | POA: Diagnosis not present

## 2020-01-23 DIAGNOSIS — R42 Dizziness and giddiness: Secondary | ICD-10-CM

## 2020-01-23 NOTE — Assessment & Plan Note (Signed)
Recent surgery- pt still c/o mod to severe medial L hand pain and unable to use  Enc f/u at Dr [employer] office

## 2020-01-23 NOTE — Patient Instructions (Addendum)
For dizziness (vertigo) try the meclizine 25 mg up to every 8 hours as needed (start with night time) and use caution of sedation  If no improvement please let us know   Make sure to change positions slowly (even rolling over in bed)   Also drink enough fluids   Don't hesitate to follow up with Dr Reola Mosher office for hearing and dizziness    I'm unsure if you would be able to do vestibular therapy at Dr Reola Mosher office (until your neck heals)- ask Dr Towanda Malkin team when that would be safe

## 2020-01-23 NOTE — Progress Notes (Signed)
Subjective:    Patient ID: Linda Robinson, female    DOB: June 01, 1929, 86 y.o.   MRN: [id number]  This visit occurred during the SARS-CoV-2 public health emergency.  Safety protocols were in place, including screening questions prior to the visit, additional usage of staff PPE, and extensive cleaning of exam room while observing appropriate contact time as indicated for disinfecting solutions.    HPI  Pt presents with c/o positional dizziness   Wt Readings from Last 3 Encounters:  01/23/20 135 lb 6 oz (61.4 kg)  11/17/19 132 lb 4 oz (60 kg)  07/12/19 138 lb (62.6 kg)   25.16 kg/m    BP Readings from Last 3 Encounters:  01/23/20 130/70  11/17/19 128/76  07/12/19 140/68  does well to drink fluids  Lab Results  Component Value Date   CREATININE 0.98 (H) 11/17/2019   BUN 26 (H) 11/17/2019   NA 145 11/17/2019   K 3.5 11/17/2019   CL 101 11/17/2019   CO2 31 11/17/2019    Pulse Readings from Last 3 Encounters:  01/23/20 67  11/17/19 88  07/12/19 94   Has not been back to Dr Richardson Landry  The R ear is deaf- and cannot be helped  May be a candidate for hearing aide on L - unsure if she can afford   She did do vestibular therapy about a year ago   Was seen here for dizziness in feb  It improved and then got worse (last week or two)   Dizziness is positional (when she moves her head too fast) Also when she turns over in bed  Feels like she is spinning  Has to stay still for 1-2 minutes at most and then it improves  Not dizzy all the time     She no longer takes hydrocodone or methocarbamol  occ takes tylenol   Has been px meclizine- but forgot to take it  It helped in the past  It does not make her sleepy       Last brain mri was about a year ago  MR [facility]en Poisson CONTRAST (Accession [id number]) (Order [id number]) Imaging Date: 12/07/2018 Department: [facility] Released By: Horton Marshall Authorizing: Clyde Canterbury, MD  Exam Status   Status  Final [99]  PACS Intelerad Image Link  Show images for MR BRAIN/IAC W WO CONTRAST  Study Result  CLINICAL DATA:  RIGHT-sided sensorineural hearing loss developed after influenza.  EXAM: MRI HEAD WITHOUT AND WITH CONTRAST  TECHNIQUE: Multiplanar, multiecho pulse sequences of the brain and surrounding structures were obtained without and with intravenous contrast.  CONTRAST:  6 mL Gadavist.  COMPARISON:  CT head 11/15/2014.  FINDINGS: Brain: No evidence for acute infarction, hemorrhage, mass lesion, hydrocephalus, or extra-axial fluid. Mild atrophy. Mild subcortical and periventricular T2 and FLAIR hyperintensities, likely chronic microvascular ischemic change.  Thin-section imaging was obtained through the posterior fossa pre and postcontrast. No evidence of vestibular schwannoma. No posterior fossa mass. No labyrinthine enhancement. No temporal bone inflammatory process. No vascular loop.  Post infusion imaging through the entire head demonstrates no abnormal enhancement of the brain or meninges.  Vascular: Flow voids are maintained throughout the carotid, basilar, and vertebral arteries. There are no areas of chronic hemorrhage. Fetal origin LEFT PCA  Skull and upper cervical spine: Cervical spondylosis. Skull base marrow intact.  Sinuses/Orbits: No significant paranasal sinus disease. Negative visualized orbits and globes.  Other: None.  IMPRESSION: MRI brain demonstrates mild atrophy and small vessel disease.  No acute intracranial findings.  No labyrinthine enhancement or retrocochlear lesion is observed.   Electronically Signed   By: Staci Righter M.D.   On: 12/07/2018 16:36      She had cs surgery in mid march -Dr Patrice Paradise  It did not help much  She has seen the extenders but not had f/u with Dr Patrice Paradise  She is disappointed  Unsure if it will improve more  She sees PA again then sees Dr Patrice Paradise  Still a lot of pain in her L  hand (medial hand) -hard to use it and L handed   Patient Active Problem List   Diagnosis Date Noted  . Grief reaction 11/17/2019  . Cervical stenosis of spinal canal 11/17/2019  . Hearing loss 11/17/2019  . Poor balance 06/12/2019  . Screening mammogram, encounter for 05/23/2018  . Substernal chest pain 08/16/2017  . Atherosclerosis of aorta ([place]) 03/09/2017  . Weight loss 02/23/2017  . Dizziness 05/19/2016  . Routine general medical examination at a health care facility 07/05/2015  . Estrogen deficiency 07/05/2015  . Prediabetes 01/01/2015  . Lumbar spinal stenosis 11/13/2014  . Vertigo 10/05/2014  . Risk for falls 06/27/2014  . Low back pain on right side with sciatica 07/24/2013  . Edema 04/26/2013  . Encounter for Medicare annual wellness exam 04/11/2013  . GERD (gastroesophageal reflux disease)   . Bradycardia 05/19/2010  . Disorder of bone and cartilage 01/30/2009  . CONSTIPATION 11/01/2007  . Depression with anxiety 02/14/2007  . Hyperlipidemia LDL goal <130 02/04/2007  . Gout 02/04/2007  . Essential hypertension 02/04/2007  . DIVERTICULOSIS, COLON 02/04/2007  . Renal insufficiency 02/04/2007  . OSTEOARTHRITIS 02/04/2007  . [place] DISEASE 02/04/2007   Past Medical History:  Diagnosis Date  . Basal cell carcinoma    removed  . Chest pain    a. 02/2017 MV: EF 54%, small, distal anterior wall/apical infacrt (likely misregistration), no ischemia-->Low risk; b. 02/2017 Echo: EF 55-60%, mild MR, PASP 69mmHg.  Marland Kitchen Chronic back pain    stenosis  . Constipation    takes Colace daily  . Depression    takes Zoloft daily  . Diverticulosis of colon   . GERD (gastroesophageal reflux disease)    atypical chest pain  . Gout    hx of;was on Allopurinol but taken off 6 wks ago by medical MD  . History of diverticulitis of colon   . HLD (hyperlipidemia)    takes Pravastatin daily  . HTN (hypertension)    takes Amlodipine and carvedilol  . OA (osteoarthritis)     right knee  . Osteopenia    takes Calcium and Vit D daily  . Vertigo    takes Antivert daily as needed   Past Surgical History:  Procedure Laterality Date  . APPENDECTOMY  at age 31  . BACK SURGERY    . CATARACT EXTRACTION Right 04/2006  . CHOLECYSTECTOMY    . COLONOSCOPY    . KNEE SURGERY Right    Medial meniscus tear  . LUMBAR LAMINECTOMY/DECOMPRESSION MICRODISCECTOMY Bilateral 11/13/2014   Procedure: Lumbar one-two Bilateral laminectomy and foraminotomy, Left Lumbar one-two microdiscectomy ;  Surgeon: Faythe Ghee, MD;  Location: [facility];  Service: Neurosurgery;  Laterality: Bilateral;  . NECK SURGERY     x 2;fusion  . PARTIAL COLECTOMY  09/1999   d/t diverticulitis  . TUBAL LIGATION     Social History   Tobacco Use  . Smoking status: Never Smoker  . Smokeless tobacco: Never Used  Substance  Use Topics  . Alcohol use: No    Alcohol/week: 0.0 standard drinks  . Drug use: Never   Family History  Problem Relation Age of Onset  . Cancer Father        throat and bladder  . Diabetes Father   . Hypertension Father   . Heart attack Father   . Stroke Mother   . Alzheimer's disease Mother   . Hypertension Mother    Allergies  Allergen Reactions  . Ace Inhibitors     REACTION: increased K+, increased creat.  . Bactrim [Sulfamethoxazole-Trimethoprim]     rash  . Cephalosporins     REACTION: reaction not known  . Colesevelam     REACTION: constipation  . Hctz [Hydrochlorothiazide]     Makes her feel "bad"  . Losartan     Increased K  . Lovastatin     REACTION: doesn't work  . Raloxifene     REACTION: cramps  . Rosuvastatin     REACTION: myalgia  . Simvastatin     REACTION: myalgia   Current Outpatient Medications on File Prior to Visit  Medication Sig Dispense Refill  . amLODipine (NORVASC) 10 MG tablet Take 1 tablet (10 mg total) by mouth daily. 90 tablet 1  . aspirin EC 81 MG tablet Take 1 tablet (81 mg total) by mouth daily. 90 tablet 3  . Calcium  Carbonate-Vitamin D (CALCIUM 500 + D) 500-125 MG-UNIT TABS Take by mouth.    . Cranberry 250 MG CAPS Take 1 capsule by mouth daily.      Marland Kitchen docusate sodium (COLACE) 100 MG capsule Take 1-2 capsules (100-200 mg total) by mouth 2 (two) times daily. 100 mg in the morning and 200 mg at night 180 capsule 1  . famotidine (PEPCID) 20 MG tablet Take 1 tablet (20 mg total) by mouth 2 (two) times daily. 180 tablet 3  . fish oil-omega-3 fatty acids 1000 MG capsule Take 1 g by mouth at bedtime.     . furosemide (LASIX) 20 MG tablet TAKE 1 TABLET DAILY AS NEEDED FOR SWELLING 90 tablet 1  . gabapentin (NEURONTIN) 100 MG capsule TAKE 1 CAPSULE BY ORAL ROUTE AT NIGHTTIME AND AFTER 3 DAYS, CAN INCREASE TO 3X DAILY AS TOLERATED    . hydrALAZINE (APRESOLINE) 10 MG tablet Take 1 tablet (10 mg total) by mouth daily. 90 tablet 1  . omeprazole (PRILOSEC) 20 MG capsule Take 1 capsule (20 mg total) by mouth daily. 90 capsule 1  . pravastatin (PRAVACHOL) 20 MG tablet Take 1 tablet (20 mg total) by mouth daily. 90 tablet 1  . sertraline (ZOLOFT) 100 MG tablet Take 1 tablet (100 mg total) by mouth daily. 90 tablet 1  . Tetrahydrozoline HCl (EYE DROPS OP) Apply 1-2 drops to eye as needed (for dry eye).    . meclizine (ANTIVERT) 25 MG tablet Take 25 mg by mouth 4 (four) times daily as needed for dizziness.      No current facility-administered medications on file prior to visit.    Review of Systems  Constitutional: Negative for activity change, appetite change, fatigue, fever and unexpected weight change.  HENT: Negative for congestion, ear pain, rhinorrhea, sinus pressure and sore throat.   Eyes: Negative for pain, redness and visual disturbance.  Respiratory: Negative for cough, shortness of breath and wheezing.   Cardiovascular: Negative for chest pain and palpitations.  Gastrointestinal: Negative for abdominal pain, blood in stool, constipation and diarrhea.  Endocrine: Negative for polydipsia and polyuria.  Genitourinary: Negative for dysuria, frequency and urgency.  Musculoskeletal: Negative for arthralgias, back pain and myalgias.       L hand pain  Neck pain  Skin: Negative for pallor and rash.  Allergic/Immunologic: Negative for environmental allergies.  Neurological: Positive for dizziness and weakness. Negative for syncope, facial asymmetry, light-headedness and headaches.       Weakness in L hand from cervical stenosis   Hematological: Negative for adenopathy. Does not bruise/bleed easily.  Psychiatric/Behavioral: Negative for decreased concentration and dysphoric mood. The patient is not nervous/anxious.        Grief- after loosing husband Having some difficulty staying organized       Objective:   Physical Exam Constitutional:      General: She is not in acute distress.    Appearance: Normal appearance. She is well-developed and normal weight. She is not ill-appearing or diaphoretic.  HENT:     Head: Normocephalic and atraumatic.     Right Ear: Tympanic membrane, ear canal and external ear normal. There is no impacted cerumen.     Left Ear: Tympanic membrane, ear canal and external ear normal. There is no impacted cerumen.     Ears:     Comments: No hearing R hear  Reduced hearing L ear     Nose: Nose normal. No congestion.     Mouth/Throat:     Pharynx: No oropharyngeal exudate.  Eyes:     General: No scleral icterus.       Right eye: No discharge.        Left eye: No discharge.     Conjunctiva/sclera: Conjunctivae normal.     Pupils: Pupils are equal, round, and reactive to light.     Comments: 1-2 beats of horizontal nystagmus bilaterally  Neck:     Thyroid: No thyromegaly.     Vascular: No carotid bruit or JVD.     Trachea: No tracheal deviation.  Cardiovascular:     Rate and Rhythm: Normal rate and regular rhythm.     Heart sounds: Normal heart sounds. No murmur.  Pulmonary:     Effort: Pulmonary effort is normal. No respiratory distress.     Breath sounds:  Normal breath sounds. No wheezing or rales.  Abdominal:     General: Bowel sounds are normal. There is no distension.     Palpations: Abdomen is soft. There is no mass.     Tenderness: There is no abdominal tenderness.  Musculoskeletal:        General: No tenderness.     Cervical back: Full passive range of motion without pain, normal range of motion and neck supple.  Lymphadenopathy:     Cervical: No cervical adenopathy.  Skin:    General: Skin is warm and dry.     Coloration: Skin is not pale.     Findings: No rash.  Neurological:     Mental Status: She is alert and oriented to person, place, and time.     Cranial Nerves: No cranial nerve deficit or facial asymmetry.     Sensory: Sensation is intact. No sensory deficit.     Motor: Weakness present. No tremor, atrophy, abnormal muscle tone or pronator drift.     Coordination: Romberg sign negative. Finger-Nose-Finger Test normal.     Gait: Gait normal.     Deep Tendon Reflexes: Reflexes are normal and symmetric. Reflexes normal.     Comments: No focal cerebellar signs   Pt gets most dizzy with head movement back/upward gaze  Weakness  in medial L hand-baseline   Gait is slow and guarded   Psychiatric:        Behavior: Behavior normal.        Thought Content: Thought content normal.           Assessment & Plan:   Problem List Items Addressed This Visit      Nervous and Auditory   Hearing loss    Total in R  Partial in L -contemplating hearing aide Sees Dr Richardson Landry in ENT        Other   Vertigo - Primary    This is somewhat acute on chronic  Had it a year ago and did vestibular therapy at Dr Reola Mosher office (does not think she could tolerate that right now s/p recent neck surgery) Worse the past few days  Reassuring exam  Also rev MR head from a year ago Disc fall precautions Reminded to try meclizine-this has helped in the past w/o overly sedating  Update if not starting to improve in a week or if worsening    Would re direct to ENT if needed  Will watch for headache or other neuro symptoms      Dizziness   Cervical stenosis of spinal canal    Recent surgery- pt still c/o mod to severe medial L hand pain and unable to use  Enc f/u at Dr Towanda Malkin office

## 2020-01-23 NOTE — Assessment & Plan Note (Signed)
This is somewhat acute on chronic  Had it a year ago and did vestibular therapy at Dr Reola Mosher office (does not think she could tolerate that right now s/p recent neck surgery) Worse the past few days  Reassuring exam  Also rev MR head from a year ago Disc fall precautions Reminded to try meclizine-this has helped in the past w/o overly sedating  Update if not starting to improve in a week or if worsening   Would re direct to ENT if needed  Will watch for headache or other neuro symptoms

## 2020-01-23 NOTE — Assessment & Plan Note (Signed)
Total in R  Partial in L -contemplating hearing aide Sees Dr Richardson Landry in ENT

## 2020-03-13 ENCOUNTER — Telehealth: Payer: Self-pay

## 2020-03-13 DIAGNOSIS — Z6824 Body mass index (BMI) 24.0-24.9, adult: Secondary | ICD-10-CM | POA: Diagnosis not present

## 2020-03-13 DIAGNOSIS — M5013 Cervical disc disorder with radiculopathy, cervicothoracic region: Secondary | ICD-10-CM | POA: Diagnosis not present

## 2020-03-13 DIAGNOSIS — M4322 Fusion of spine, cervical region: Secondary | ICD-10-CM | POA: Diagnosis not present

## 2020-03-13 DIAGNOSIS — I10 Essential (primary) hypertension: Secondary | ICD-10-CM | POA: Diagnosis not present

## 2020-03-13 NOTE — Telephone Encounter (Signed)
Patient is requesting new referral for physicial therapy for her hand states the place she is going to is $200 per visit. Please advise

## 2020-03-13 NOTE — Telephone Encounter (Signed)
I think her treating physician may have to order it- is it for the hand problem from her cervical radiculitis or something else?   Where is she going now?

## 2020-03-14 NOTE — Telephone Encounter (Signed)
Spoke with pt. Advised her of Dr. Marliss Coots comments. Pt said her left hand is an issues due to her neck surgery and her cervical radiculitis. Pt said that she has been seeing Dr. Patrice Paradise and his PAs and they recommended PT. She said she called her insurance and they told her it's a $35 co-pay for PT and then she called the PT place they referred her to and they advised her it could be $200 or more for her visit. Pt said she didn't want to risk having to pay $200 or more so she would like to go to [facility] because she knows it's only a $35 co-pay there. I did give her their phone # but advised her she should let her Ortho office know her issues. Pt said she will call them back and let them know and see if they can do a new referral to [facility].  FYI to PCP

## 2020-03-14 NOTE — Telephone Encounter (Signed)
That sounds good, thanks  Her ortho should make the referral because I'm unsure what they will want to order

## 2020-03-25 ENCOUNTER — Telehealth: Payer: Self-pay

## 2020-03-25 NOTE — Telephone Encounter (Signed)
Voicemail message, 6/25 at 9:44a:  Pt returning call regarding a new patient referral.

## 2020-03-26 NOTE — Progress Notes (Signed)
Linda Robinson T. Ernestyne Caldwell, MD, [place] at [facility] [place], [address]  Phone: [phone]  FAX: [place] - 86 y.o. female  MRN [id number]  Date of Birth: October 01, 1928  Date: 03/27/2020  PCP: Abner Greenspan, MD  Referral: Abner Greenspan, MD  Chief Complaint  Patient presents with  . Hand Pain    Left    This visit occurred during the SARS-CoV-2 public health emergency.  Safety protocols were in place, including screening questions prior to the visit, additional usage of staff PPE, and extensive cleaning of exam room while observing appropriate contact time as indicated for disinfecting solutions.   Subjective:   MARCHETTA NAVRATIL is a 86 y.o. very pleasant female patient with Body mass index is 24.72 kg/m. who presents with the following:  She is a LHD very nice lady who I recall quite well and I have seen a few times over the years.  Last time I saw her was approximately 9 months ago with some cervical radiculopathy.  This was fairly severe and she had radicular symptoms all the way down to her left hand.  She does have a history of having a prior decompression and fusion, cervical, by Dr. Patrice Paradise in [place].  She is having some significant left-sided hand weakness.  She is not a very good historian, and I believe she was telling me that she had some weakness in her hand prior to surgery.  She does definitely have some muscular atrophy at the hand as well as the forearm on the left.  She has a follow-up appointment with her spine surgeon, Dr. Patrice Paradise, in about 6 weeks.  She also has follow-up for EMG nerve conduction velocities with [facility], which Dr. Patrice Paradise set up.  She currently not doing any OT.  She does have significant difficulty driving, and her husband just died.  She does not think she would be able to drive to go to occupational therapy.  She is only done some  basic home rehab now.  She is a little bit confused about the exact surgical procedure that she had, she seems to not understand that she did have a bone graft.  8/16 appt with Dr. Patrice Paradise 8/23 - appt with EMG / NCV  12/12/2019 - fusion, ? About this.  She is somewhat confused about exactly what procedure she had.  It does appear that she had a revision of C7-T1 ACDF.  Hospital record:  Pertinent Hospital Course: The patient on 12/11/19 s/p elective C7-T1 ACDF (see below) by Dr. Patrice Paradise.   Per M.D. Operative Note: Procedure:  1. Anterior cervical arthrodesis C7-T1 with placement of Titan titanium cage 2. Anterior cervical discectomy C7-T1 with wide decompression of the thecal sac and nerve roots bilaterally 3. Anterior cervical instrumentation utilizing the Medtronic Zevo plating system C7-T1 4. Harvest of anterior iliac crest bone graft from the left hip through a separate skin and fascial incision 5. Local autogenous bone graft supplemented with Master graft bone graft substitute 6. Removal of anterior cervical instrumentation from C6-7   L ext - 2-4 L fingers Also 1-5 flexion decreased   4-5 are the worst.  OT  - [employer].  Did not do hand therapy  HH OT to work with her hand?  Full Operative Report:  [facility] OPERATIVE REPORT   Date of Surgery: December 12, 2019  Diagnosis:  1. C7-T1  foraminal stenosis 2. Cervical spondylotic radiculopathy 3. Status post history of C3 to C7 anterior cervical fusion 4. C7-T1 segmental kyphosis 5. Left C8 radiculopathy 6. C6-7 anterior cervical instrumentation  Procedure:  1. Anterior cervical arthrodesis C7-T1 with placement of Titan titanium cage 2. Anterior cervical discectomy C7-T1 with wide decompression of the thecal sac and nerve roots bilaterally 3. Anterior cervical instrumentation utilizing the Medtronic Zevo plating system C7-T1 4. Harvest of anterior iliac crest bone graft from the left hip through a  separate skin and fascial incision 5. Local autogenous bone graft supplemented with Master graft bone graft substitute 6. Removal of anterior cervical instrumentation from C6-7  Surgeon: Starling Manns, MD  Assistant: Feliciana Rossetti, PA  Anesthesia: General Endotracheal   EBL: 50 mL  Complications: None  Needle and Sponge Count: Correct  Indications: Patient's a pleasant lady with history of multiple previous cervical surgeries resulting in anterior cervical fusion from C3-C7 with a plate at Q0-0. She has a severe persistent left C8 radiculopathy with weakness and pain. Imaging reveals severe left C7-T1 foraminal stenosis concordant with her symptomatology. She has failed to respond to appropriate conservative treatment options and now elects to undergo anterior cervical discectomy and fusion as listed above. Risks benefits and alternatives were reviewed and she elected to proceed. She expressed a good understanding.  Procedure: After informed consent he was taken to the operating room. Prophylactic IV antibiotics were administered. She was carefully positioned on the operating room table in the supine position. The head was supported on the [place] horseshoe head rest. 5 pounds of halter traction applied. The arms were tucked at the side. Great care was taken in positioning. All bony prominences were padded. The neck and left iliac crest were prepped and draped in the usual sterile fashion.   Stab incision made over the left iliac. A bone harvester was used to collect several cord bone. This bone was saved for later autografting. Bleeding was controlled with FloSeal. Meticulous hemostasis achieved. Dermabond was applied. Sterile dressing applied.  We then turned our attention to the cervical spine. She had a scar on the left side of her neck just above the clavicle. The scar was reopened sharply and dissection was carried through the platysma. The interval between the SCM and strap muscles  was made down to the anterior cervical spine. The esophagus trachea and carotid sheath were identified and protected at all times. There was scar from previous surgery which was carefully dissected. The anterior cervical plate at Q6-7 could be palpated along with osteophyte at C7-T1. Deep shadow line retractor placed. The anterior osteophyte was removed at C7-T1. In order to allow access to the C7-T1 level the plate at Y1-9 was exposed. The screws were removed and found to be well fixed. The plate was then removed from the C6-7 level using a small osteotome. The underlying fusion appeared to be solid. Caspar distraction pins placed into the C7 and T1 vertebral bodies. Gentle distraction was applied. At this point the surgical microscope was draped and brought into the field. The remainder of the operation was done under microscopic magnification and illumination. The disc space was collapsed. Radical discectomy was completed. The disc material was degenerative and in fact there was very little disc within the interspace. The cartilaginous endplates were scraped clean. Using the high-speed drill the posterior vertebral margins of the uncovertebral joints were thinned. Thin footed Kerrison punches were then used to complete the decompression. The posterior longitudinal ligament was taken down. The posterior vertebral  margins were decompressed. Wide foraminotomies were completed. We confirmed a good decompression using a nerve hook. Bleeding was controlled with bipolar electrocautery and FloSeal. Once we were satisfied with the decompression I turned my attention to completing the arthrodesis. A 6 mm cage was trialed with a small footprint. This cage was packed with the iliac crest bone graft mixed with local autogenous bone graft from the decompression. The cage was countersunk 1 mm into the interspace. We then placed the Medtronic Zevo plate. We utilized four 13 millimeter locking screws. The fixation was fairly good  considering the patient's age. The locking mechanisms were engaged. The wound was irrigated. Meticulous hemostasis was achieved. The wound was observed for 5 minutes confirming excellent hemostasis. The platysma closure using interrupted 2-0 Vicryl suture. Subcuticular layer closed with a 3-0 Vicryl suture followed by Dermabond on the skin edges. Intraoperative x-rays demonstrated the plate at V4-Q5 however the patient's shoulders obscured good visualization. Further imaging will be obtained postoperatively. Patient was extubated without difficulty and transferred to recovery in good condition.  It should be noted my assistant Feliciana Rossetti, PA was present throughout the procedure. She assisted with the anterior cervical arthrodesis, the placement of the cage, the discectomy, the harvest of the iliac crest bone graft, the removal of the plate at Z5-6, the placement of the anterior cervical plate at L8-V5 and she assisted with wound closure. It should be noted that the unique skill set up the physician assistant was necessary to safely complete this procedure under the surgical microscope. In addition, the surgical tech was not available to assist as she was managing the back table.   Electronically signed by: Max Venia Minks, MD 12/12/19 1128   Review of Systems is noted in the HPI, as appropriate   Objective:   BP 140/60   Pulse 90   Temp 98.4 F (36.9 C) (Temporal)   Ht 5' 1.5" (1.562 m)   Wt 133 lb (60.3 kg)   SpO2 98%   BMI 24.72 kg/m    GEN: No acute distress; alert,appropriate. PULM: Breathing comfortably in no respiratory distress PSYCH: Normally interactive.    Full range of motion at the shoulder and elbow with preserved strength at the elbow, flexion and extension as well as all movements at the shoulder.  The right hand has full range of motion at the hand and wrist with increased global grip strength compared to the left side.  She does have some modest muscle  wasting on the right.  The left side has obvious muscular atrophy on the hand as well as in the forearm.  Sensation is full and intact. She does have some significant decreased strength in flexion and extension at the fingers.  Flexion at digits 1 through 5 is diminished.  Extension is diminished at 2 through 5.  With both flexion and extension 4 and 5 are significantly impaired.  She also has some diminished extension and flexion at the wrist with pain with terminal extension, flexion, as well as ulnar and radial deviation.  Radiology: No results found.  Assessment and Plan:     ICD-10-CM   1. Left hand weakness  R29.898 Ambulatory referral to Home Health  2. Cervical vertebral fusion  M43.22 Ambulatory referral to Home Health  3. Atrophy of muscle of left forearm  M62.532 Ambulatory referral to [place]   Total encounter time: 40 minutes. On the day of the patient encounter, this can include review of prior records, labs, and imaging.  Additional time can  include counselling, consultation with peer MD in person or by telephone.  This also includes independent review of Radiology.  Extensive chart review from multiple hospital systems, very complicated case, 20 to 30 minutes spent on chart review and alone.  This is a very complex spine case.  She really needs to follow-up with her spine surgeon Dr. Patrice Paradise, and she needs to get a better understanding of the surgery that she had and follow-up.  It does look like he has set up EMG and nerve conduction studies with [facility] neurology, this is entirely appropriate to assess nerve involvement in the left upper extremity which has some atrophy and weakness throughout the entirety of the hand, wrist, and forearm.  Home Health documentation: Face to face encounter documentation follows: Patient needs home health services for the following reasons:  Home health services needed:  [facility] PT and OT External Referral, Routine, Home Health Services,  Specialty Services Required Does the patient have Medicare or Medicaid? Yes The encounter with the patient was in whole, or in part, for the following medical condition, which is the primary reason for home health care: yes Reason for Medically Necessary Home Health Services: Therapy- Therapeutic Exercises to Increase Strength and Endurance My clinical findings support the need for the above services: Unable to leave home safely without assistance and/or assistive device I certify that, based on my findings, the following services are medically necessary home health services: Physical therapy Further, I certify that my clinical findings support that this patient is homebound due to: Unable to leave home safely without assistance  Left-sided lack of function, decreased strength, muscular atrophy in the left hand dominant patient.  OT and PT to assess.  And rehabilitation needed.  Status post complex cervical spine decompression and fusion with hardware and bone graft.  Spine surgeon is Dr. Patrice Paradise. Evaluate and treat.  She is unable to drive to outpatient appointment secondary to advanced age at age 41 and inability to drive to location without assistance.  Follow-up: No follow-ups on file.  No orders of the defined types were placed in this encounter.  Medications Discontinued During This Encounter  Medication Reason  . gabapentin (NEURONTIN) 100 MG capsule Completed Course   Orders Placed This Encounter  Procedures  . Ambulatory referral to [place],  Maud Deed. Fedrick Cefalu, MD   Outpatient Encounter Medications as of 03/27/2020  Medication Sig  . amLODipine (NORVASC) 10 MG tablet Take 1 tablet (10 mg total) by mouth daily.  Marland Kitchen aspirin EC 81 MG tablet Take 1 tablet (81 mg total) by mouth daily.  . Calcium Carbonate-Vitamin D (CALCIUM 500 + D) 500-125 MG-UNIT TABS Take by mouth.  . Cranberry 250 MG CAPS Take 1 capsule by mouth daily.    Marland Kitchen docusate sodium (COLACE) 100 MG  capsule Take 1-2 capsules (100-200 mg total) by mouth 2 (two) times daily. 100 mg in the morning and 200 mg at night  . famotidine (PEPCID) 20 MG tablet Take 1 tablet (20 mg total) by mouth 2 (two) times daily.  . fish oil-omega-3 fatty acids 1000 MG capsule Take 1 g by mouth at bedtime.   . furosemide (LASIX) 20 MG tablet TAKE 1 TABLET DAILY AS NEEDED FOR SWELLING  . hydrALAZINE (APRESOLINE) 10 MG tablet Take 1 tablet (10 mg total) by mouth daily.  . meclizine (ANTIVERT) 25 MG tablet Take 25 mg by mouth 4 (four) times daily as needed for dizziness.   Marland Kitchen omeprazole (PRILOSEC) 20 MG capsule Take  1 capsule (20 mg total) by mouth daily.  . pravastatin (PRAVACHOL) 20 MG tablet Take 1 tablet (20 mg total) by mouth daily.  . sertraline (ZOLOFT) 100 MG tablet Take 1 tablet (100 mg total) by mouth daily.  . Tetrahydrozoline HCl (EYE DROPS OP) Apply 1-2 drops to eye as needed (for dry eye).  . [DISCONTINUED] gabapentin (NEURONTIN) 100 MG capsule TAKE 1 CAPSULE BY ORAL ROUTE AT NIGHTTIME AND AFTER 3 DAYS, CAN INCREASE TO 3X DAILY AS TOLERATED   No facility-administered encounter medications on file as of 03/27/2020.

## 2020-03-27 ENCOUNTER — Encounter: Payer: Self-pay | Admitting: Family Medicine

## 2020-03-27 ENCOUNTER — Ambulatory Visit (INDEPENDENT_AMBULATORY_CARE_PROVIDER_SITE_OTHER): Payer: Medicare HMO | Admitting: Family Medicine

## 2020-03-27 ENCOUNTER — Other Ambulatory Visit: Payer: Self-pay

## 2020-03-27 VITALS — BP 140/60 | HR 90 | Temp 98.4°F | Ht 61.5 in | Wt 133.0 lb

## 2020-03-27 DIAGNOSIS — M62532 Muscle wasting and atrophy, not elsewhere classified, left forearm: Secondary | ICD-10-CM | POA: Diagnosis not present

## 2020-03-27 DIAGNOSIS — M4322 Fusion of spine, cervical region: Secondary | ICD-10-CM

## 2020-03-27 DIAGNOSIS — R29898 Other symptoms and signs involving the musculoskeletal system: Secondary | ICD-10-CM | POA: Diagnosis not present

## 2020-03-27 NOTE — Patient Instructions (Signed)
Our office will call you about Occupational Therapy to help with your hand

## 2020-04-04 DIAGNOSIS — M5412 Radiculopathy, cervical region: Secondary | ICD-10-CM | POA: Diagnosis not present

## 2020-04-04 DIAGNOSIS — M4802 Spinal stenosis, cervical region: Secondary | ICD-10-CM | POA: Diagnosis not present

## 2020-04-04 DIAGNOSIS — K5730 Diverticulosis of large intestine without perforation or abscess without bleeding: Secondary | ICD-10-CM | POA: Diagnosis not present

## 2020-04-04 DIAGNOSIS — I10 Essential (primary) hypertension: Secondary | ICD-10-CM | POA: Diagnosis not present

## 2020-04-04 DIAGNOSIS — M48061 Spinal stenosis, lumbar region without neurogenic claudication: Secondary | ICD-10-CM | POA: Diagnosis not present

## 2020-04-04 DIAGNOSIS — M4322 Fusion of spine, cervical region: Secondary | ICD-10-CM | POA: Diagnosis not present

## 2020-04-04 DIAGNOSIS — R69 Illness, unspecified: Secondary | ICD-10-CM | POA: Diagnosis not present

## 2020-04-04 DIAGNOSIS — M1990 Unspecified osteoarthritis, unspecified site: Secondary | ICD-10-CM | POA: Diagnosis not present

## 2020-04-04 DIAGNOSIS — M109 Gout, unspecified: Secondary | ICD-10-CM | POA: Diagnosis not present

## 2020-04-04 DIAGNOSIS — M62532 Muscle wasting and atrophy, not elsewhere classified, left forearm: Secondary | ICD-10-CM | POA: Diagnosis not present

## 2020-04-05 ENCOUNTER — Telehealth: Payer: Self-pay

## 2020-04-05 DIAGNOSIS — M48061 Spinal stenosis, lumbar region without neurogenic claudication: Secondary | ICD-10-CM | POA: Diagnosis not present

## 2020-04-05 DIAGNOSIS — M4802 Spinal stenosis, cervical region: Secondary | ICD-10-CM | POA: Diagnosis not present

## 2020-04-05 DIAGNOSIS — M4322 Fusion of spine, cervical region: Secondary | ICD-10-CM | POA: Diagnosis not present

## 2020-04-05 DIAGNOSIS — K5730 Diverticulosis of large intestine without perforation or abscess without bleeding: Secondary | ICD-10-CM | POA: Diagnosis not present

## 2020-04-05 DIAGNOSIS — M1990 Unspecified osteoarthritis, unspecified site: Secondary | ICD-10-CM | POA: Diagnosis not present

## 2020-04-05 DIAGNOSIS — I10 Essential (primary) hypertension: Secondary | ICD-10-CM | POA: Diagnosis not present

## 2020-04-05 DIAGNOSIS — R69 Illness, unspecified: Secondary | ICD-10-CM | POA: Diagnosis not present

## 2020-04-05 DIAGNOSIS — M109 Gout, unspecified: Secondary | ICD-10-CM | POA: Diagnosis not present

## 2020-04-05 DIAGNOSIS — M5412 Radiculopathy, cervical region: Secondary | ICD-10-CM | POA: Diagnosis not present

## 2020-04-05 DIAGNOSIS — M62532 Muscle wasting and atrophy, not elsewhere classified, left forearm: Secondary | ICD-10-CM | POA: Diagnosis not present

## 2020-04-05 NOTE — Telephone Encounter (Signed)
Tillie Rung PT with [facility] HH left v/m requesting verbal orders for [facility] PT 2 x a wk for 1 wk and 1 x a wk for 2 wks for balance and strengthening. Tillie Rung also request verbal orders for [facility] OT for blanket orders for strengthening and improve function.

## 2020-04-05 NOTE — Telephone Encounter (Signed)
Tillie Rung notified as instructed and voiced understanding.nothing further needed at this time.

## 2020-04-05 NOTE — Telephone Encounter (Signed)
Butch Penny is gone for the day.  I am completely fine with all of this, and I would appreciate if you could send a verbal order.  Thank you so much.

## 2020-04-09 DIAGNOSIS — M4322 Fusion of spine, cervical region: Secondary | ICD-10-CM | POA: Diagnosis not present

## 2020-04-09 DIAGNOSIS — K5730 Diverticulosis of large intestine without perforation or abscess without bleeding: Secondary | ICD-10-CM | POA: Diagnosis not present

## 2020-04-09 DIAGNOSIS — M48061 Spinal stenosis, lumbar region without neurogenic claudication: Secondary | ICD-10-CM | POA: Diagnosis not present

## 2020-04-09 DIAGNOSIS — M4802 Spinal stenosis, cervical region: Secondary | ICD-10-CM | POA: Diagnosis not present

## 2020-04-09 DIAGNOSIS — M5412 Radiculopathy, cervical region: Secondary | ICD-10-CM | POA: Diagnosis not present

## 2020-04-09 DIAGNOSIS — M1990 Unspecified osteoarthritis, unspecified site: Secondary | ICD-10-CM | POA: Diagnosis not present

## 2020-04-09 DIAGNOSIS — R69 Illness, unspecified: Secondary | ICD-10-CM | POA: Diagnosis not present

## 2020-04-09 DIAGNOSIS — M62532 Muscle wasting and atrophy, not elsewhere classified, left forearm: Secondary | ICD-10-CM | POA: Diagnosis not present

## 2020-04-09 DIAGNOSIS — I10 Essential (primary) hypertension: Secondary | ICD-10-CM | POA: Diagnosis not present

## 2020-04-09 DIAGNOSIS — M109 Gout, unspecified: Secondary | ICD-10-CM | POA: Diagnosis not present

## 2020-04-11 DIAGNOSIS — M48061 Spinal stenosis, lumbar region without neurogenic claudication: Secondary | ICD-10-CM | POA: Diagnosis not present

## 2020-04-11 DIAGNOSIS — M4322 Fusion of spine, cervical region: Secondary | ICD-10-CM | POA: Diagnosis not present

## 2020-04-11 DIAGNOSIS — M4802 Spinal stenosis, cervical region: Secondary | ICD-10-CM | POA: Diagnosis not present

## 2020-04-11 DIAGNOSIS — I10 Essential (primary) hypertension: Secondary | ICD-10-CM | POA: Diagnosis not present

## 2020-04-11 DIAGNOSIS — R69 Illness, unspecified: Secondary | ICD-10-CM | POA: Diagnosis not present

## 2020-04-11 DIAGNOSIS — M62532 Muscle wasting and atrophy, not elsewhere classified, left forearm: Secondary | ICD-10-CM | POA: Diagnosis not present

## 2020-04-11 DIAGNOSIS — K5730 Diverticulosis of large intestine without perforation or abscess without bleeding: Secondary | ICD-10-CM | POA: Diagnosis not present

## 2020-04-11 DIAGNOSIS — M1990 Unspecified osteoarthritis, unspecified site: Secondary | ICD-10-CM | POA: Diagnosis not present

## 2020-04-11 DIAGNOSIS — M109 Gout, unspecified: Secondary | ICD-10-CM | POA: Diagnosis not present

## 2020-04-11 DIAGNOSIS — M5412 Radiculopathy, cervical region: Secondary | ICD-10-CM | POA: Diagnosis not present

## 2020-04-12 DIAGNOSIS — M1990 Unspecified osteoarthritis, unspecified site: Secondary | ICD-10-CM | POA: Diagnosis not present

## 2020-04-12 DIAGNOSIS — K5730 Diverticulosis of large intestine without perforation or abscess without bleeding: Secondary | ICD-10-CM | POA: Diagnosis not present

## 2020-04-12 DIAGNOSIS — M5412 Radiculopathy, cervical region: Secondary | ICD-10-CM | POA: Diagnosis not present

## 2020-04-12 DIAGNOSIS — M109 Gout, unspecified: Secondary | ICD-10-CM | POA: Diagnosis not present

## 2020-04-12 DIAGNOSIS — M4322 Fusion of spine, cervical region: Secondary | ICD-10-CM | POA: Diagnosis not present

## 2020-04-12 DIAGNOSIS — M4802 Spinal stenosis, cervical region: Secondary | ICD-10-CM | POA: Diagnosis not present

## 2020-04-12 DIAGNOSIS — R69 Illness, unspecified: Secondary | ICD-10-CM | POA: Diagnosis not present

## 2020-04-12 DIAGNOSIS — M62532 Muscle wasting and atrophy, not elsewhere classified, left forearm: Secondary | ICD-10-CM | POA: Diagnosis not present

## 2020-04-12 DIAGNOSIS — I10 Essential (primary) hypertension: Secondary | ICD-10-CM | POA: Diagnosis not present

## 2020-04-12 DIAGNOSIS — M48061 Spinal stenosis, lumbar region without neurogenic claudication: Secondary | ICD-10-CM | POA: Diagnosis not present

## 2020-04-16 DIAGNOSIS — R69 Illness, unspecified: Secondary | ICD-10-CM | POA: Diagnosis not present

## 2020-04-16 DIAGNOSIS — M5412 Radiculopathy, cervical region: Secondary | ICD-10-CM | POA: Diagnosis not present

## 2020-04-16 DIAGNOSIS — M1990 Unspecified osteoarthritis, unspecified site: Secondary | ICD-10-CM | POA: Diagnosis not present

## 2020-04-16 DIAGNOSIS — M4802 Spinal stenosis, cervical region: Secondary | ICD-10-CM | POA: Diagnosis not present

## 2020-04-16 DIAGNOSIS — I10 Essential (primary) hypertension: Secondary | ICD-10-CM | POA: Diagnosis not present

## 2020-04-16 DIAGNOSIS — K5730 Diverticulosis of large intestine without perforation or abscess without bleeding: Secondary | ICD-10-CM | POA: Diagnosis not present

## 2020-04-16 DIAGNOSIS — M109 Gout, unspecified: Secondary | ICD-10-CM | POA: Diagnosis not present

## 2020-04-16 DIAGNOSIS — M62532 Muscle wasting and atrophy, not elsewhere classified, left forearm: Secondary | ICD-10-CM | POA: Diagnosis not present

## 2020-04-16 DIAGNOSIS — M4322 Fusion of spine, cervical region: Secondary | ICD-10-CM | POA: Diagnosis not present

## 2020-04-16 DIAGNOSIS — M48061 Spinal stenosis, lumbar region without neurogenic claudication: Secondary | ICD-10-CM | POA: Diagnosis not present

## 2020-04-18 DIAGNOSIS — K5730 Diverticulosis of large intestine without perforation or abscess without bleeding: Secondary | ICD-10-CM | POA: Diagnosis not present

## 2020-04-18 DIAGNOSIS — M4322 Fusion of spine, cervical region: Secondary | ICD-10-CM | POA: Diagnosis not present

## 2020-04-18 DIAGNOSIS — I10 Essential (primary) hypertension: Secondary | ICD-10-CM | POA: Diagnosis not present

## 2020-04-18 DIAGNOSIS — M62532 Muscle wasting and atrophy, not elsewhere classified, left forearm: Secondary | ICD-10-CM | POA: Diagnosis not present

## 2020-04-18 DIAGNOSIS — R69 Illness, unspecified: Secondary | ICD-10-CM | POA: Diagnosis not present

## 2020-04-18 DIAGNOSIS — M109 Gout, unspecified: Secondary | ICD-10-CM | POA: Diagnosis not present

## 2020-04-18 DIAGNOSIS — M48061 Spinal stenosis, lumbar region without neurogenic claudication: Secondary | ICD-10-CM | POA: Diagnosis not present

## 2020-04-18 DIAGNOSIS — M4802 Spinal stenosis, cervical region: Secondary | ICD-10-CM | POA: Diagnosis not present

## 2020-04-18 DIAGNOSIS — M1990 Unspecified osteoarthritis, unspecified site: Secondary | ICD-10-CM | POA: Diagnosis not present

## 2020-04-18 DIAGNOSIS — M5412 Radiculopathy, cervical region: Secondary | ICD-10-CM | POA: Diagnosis not present

## 2020-04-23 DIAGNOSIS — M109 Gout, unspecified: Secondary | ICD-10-CM | POA: Diagnosis not present

## 2020-04-23 DIAGNOSIS — K5730 Diverticulosis of large intestine without perforation or abscess without bleeding: Secondary | ICD-10-CM | POA: Diagnosis not present

## 2020-04-23 DIAGNOSIS — I10 Essential (primary) hypertension: Secondary | ICD-10-CM | POA: Diagnosis not present

## 2020-04-23 DIAGNOSIS — M62532 Muscle wasting and atrophy, not elsewhere classified, left forearm: Secondary | ICD-10-CM | POA: Diagnosis not present

## 2020-04-23 DIAGNOSIS — M48061 Spinal stenosis, lumbar region without neurogenic claudication: Secondary | ICD-10-CM | POA: Diagnosis not present

## 2020-04-23 DIAGNOSIS — R69 Illness, unspecified: Secondary | ICD-10-CM | POA: Diagnosis not present

## 2020-04-23 DIAGNOSIS — M1990 Unspecified osteoarthritis, unspecified site: Secondary | ICD-10-CM | POA: Diagnosis not present

## 2020-04-23 DIAGNOSIS — M4322 Fusion of spine, cervical region: Secondary | ICD-10-CM | POA: Diagnosis not present

## 2020-04-23 DIAGNOSIS — M5412 Radiculopathy, cervical region: Secondary | ICD-10-CM | POA: Diagnosis not present

## 2020-04-23 DIAGNOSIS — M4802 Spinal stenosis, cervical region: Secondary | ICD-10-CM | POA: Diagnosis not present

## 2020-04-24 DIAGNOSIS — K5730 Diverticulosis of large intestine without perforation or abscess without bleeding: Secondary | ICD-10-CM | POA: Diagnosis not present

## 2020-04-24 DIAGNOSIS — M48061 Spinal stenosis, lumbar region without neurogenic claudication: Secondary | ICD-10-CM | POA: Diagnosis not present

## 2020-04-24 DIAGNOSIS — M109 Gout, unspecified: Secondary | ICD-10-CM | POA: Diagnosis not present

## 2020-04-24 DIAGNOSIS — M1990 Unspecified osteoarthritis, unspecified site: Secondary | ICD-10-CM | POA: Diagnosis not present

## 2020-04-24 DIAGNOSIS — I10 Essential (primary) hypertension: Secondary | ICD-10-CM | POA: Diagnosis not present

## 2020-04-24 DIAGNOSIS — M62532 Muscle wasting and atrophy, not elsewhere classified, left forearm: Secondary | ICD-10-CM | POA: Diagnosis not present

## 2020-04-24 DIAGNOSIS — M4802 Spinal stenosis, cervical region: Secondary | ICD-10-CM | POA: Diagnosis not present

## 2020-04-24 DIAGNOSIS — R69 Illness, unspecified: Secondary | ICD-10-CM | POA: Diagnosis not present

## 2020-04-24 DIAGNOSIS — M4322 Fusion of spine, cervical region: Secondary | ICD-10-CM | POA: Diagnosis not present

## 2020-04-24 DIAGNOSIS — M5412 Radiculopathy, cervical region: Secondary | ICD-10-CM | POA: Diagnosis not present

## 2020-05-01 ENCOUNTER — Other Ambulatory Visit: Payer: Self-pay

## 2020-05-01 ENCOUNTER — Encounter: Payer: Self-pay | Admitting: Family Medicine

## 2020-05-01 ENCOUNTER — Ambulatory Visit (INDEPENDENT_AMBULATORY_CARE_PROVIDER_SITE_OTHER): Payer: Medicare HMO | Admitting: Family Medicine

## 2020-05-01 VITALS — BP 146/68 | HR 73 | Temp 97.2°F | Ht 61.5 in | Wt 133.1 lb

## 2020-05-01 DIAGNOSIS — R531 Weakness: Secondary | ICD-10-CM

## 2020-05-01 DIAGNOSIS — R7303 Prediabetes: Secondary | ICD-10-CM

## 2020-05-01 DIAGNOSIS — M4802 Spinal stenosis, cervical region: Secondary | ICD-10-CM | POA: Diagnosis not present

## 2020-05-01 NOTE — Assessment & Plan Note (Signed)
Lab Results  Component Value Date   HGBA1C 5.3 11/17/2019   Eating less and now may be having some hypoglycemia Disc trying to increase calories with 6 small meals daily with protein

## 2020-05-01 NOTE — Assessment & Plan Note (Signed)
Improved after eating  Suspect some degree of hypoglycemia (past prediabetes) Plan to eat 6 smaller meals per day with protein-especially at bedtime Pt will track this and update [place] if no further improvement

## 2020-05-01 NOTE — Assessment & Plan Note (Signed)
S/p surgery  Unable to use L hand for the most part -also painful

## 2020-05-01 NOTE — Progress Notes (Signed)
Subjective:    Patient ID: Linda Robinson, female    DOB: Jul 09, 1929, 86 y.o.   MRN: [id number]  This visit occurred during the SARS-CoV-2 public health emergency.  Safety protocols were in place, including screening questions prior to the visit, additional usage of staff PPE, and extensive cleaning of exam room while observing appropriate contact time as indicated for disinfecting solutions.    HPI Pt presents with vague symptoms of weakness for a week  Wt Readings from Last 3 Encounters:  05/01/20 133 lb 1 oz (60.4 kg)  03/27/20 133 lb (60.3 kg)  01/23/20 135 lb 6 oz (61.4 kg)   24.73 kg/m  In the past few weeks first thing in am became weak all over   Symptom is generalized weakness /fatigue  (at times shaky/confused)   Not dizzy  No headache  No cp or sob  No headache   In the past Cereal and coffee in am  Lunch-meals on wheels - veg /sandwhich (would eat some of it but not all of it) - did not drink the milk or chocolate milk they brought -did not like it Dinner/supper - sandwich or cooks something on weekends (veg and meat)   Does eat snack occ Bowl of ice cream occ  Berries  ? If enough protein   Appetite is not good   Now- she is letting herself have sweets  2-3 cookies with her breakfast  Also at bedtime   She used to eat at latest 5:30     Mood -has been lonely since her husband passed away  Living alone since December  occ anxious  Tries to keep busy Cannot crochet well with left hand losses and pain   She has a few friends and also kids and great grand kids    Starting Monday- she ate some sweets  She did feel quite a bit better  Now she wonders if this may be blood sugar related     Stress-had surgery and her L hand still does not work well/struggling with that    No urinary symptoms    HTN-she checks bp at home - occ elevated  Pretty good when PT came for her hand - very good  bp is stable today  No cp or palpitations or  headaches or edema  No side effects to medicines  BP Readings from Last 3 Encounters:  05/01/20 (!) 146/68  03/27/20 140/60  01/23/20 130/70     Pulse Readings from Last 3 Encounters:  05/01/20 73  03/27/20 90  01/23/20 67    Lab Results  Component Value Date   CREATININE 0.98 (H) 11/17/2019   BUN 26 (H) 11/17/2019   NA 145 11/17/2019   K 3.5 11/17/2019   CL 101 11/17/2019   CO2 31 11/17/2019   Prediabetes Lab Results  Component Value Date   HGBA1C 5.3 11/17/2019   Patient Active Problem List   Diagnosis Date Noted  . Generalized weakness 05/01/2020  . Grief reaction 11/17/2019  . Cervical stenosis of spinal canal 11/17/2019  . Hearing loss 11/17/2019  . Poor balance 06/12/2019  . Screening mammogram, encounter for 05/23/2018  . Substernal chest pain 08/16/2017  . Atherosclerosis of aorta ([place]) 03/09/2017  . Weight loss 02/23/2017  . Dizziness 05/19/2016  . Routine general medical examination at a health care facility 07/05/2015  . Estrogen deficiency 07/05/2015  . Prediabetes 01/01/2015  . Lumbar spinal stenosis 11/13/2014  . Vertigo 10/05/2014  . Risk for falls 06/27/2014  .  Low back pain on right side with sciatica 07/24/2013  . Edema 04/26/2013  . Encounter for Medicare annual wellness exam 04/11/2013  . GERD (gastroesophageal reflux disease)   . Bradycardia 05/19/2010  . Disorder of bone and cartilage 01/30/2009  . CONSTIPATION 11/01/2007  . Depression with anxiety 02/14/2007  . Hyperlipidemia LDL goal <130 02/04/2007  . Gout 02/04/2007  . Essential hypertension 02/04/2007  . DIVERTICULOSIS, COLON 02/04/2007  . Renal insufficiency 02/04/2007  . OSTEOARTHRITIS 02/04/2007  . [place] DISEASE 02/04/2007   Past Medical History:  Diagnosis Date  . Basal cell carcinoma    removed  . Chest pain    a. 02/2017 MV: EF 54%, small, distal anterior wall/apical infacrt (likely misregistration), no ischemia-->Low risk; b. 02/2017 Echo: EF 55-60%, mild  MR, PASP 41mmHg.  Marland Kitchen Chronic back pain    stenosis  . Constipation    takes Colace daily  . Depression    takes Zoloft daily  . Diverticulosis of colon   . GERD (gastroesophageal reflux disease)    atypical chest pain  . Gout    hx of;was on Allopurinol but taken off 6 wks ago by medical MD  . History of diverticulitis of colon   . HLD (hyperlipidemia)    takes Pravastatin daily  . HTN (hypertension)    takes Amlodipine and carvedilol  . OA (osteoarthritis)    right knee  . Osteopenia    takes Calcium and Vit D daily  . Vertigo    takes Antivert daily as needed   Past Surgical History:  Procedure Laterality Date  . APPENDECTOMY  at age 36  . BACK SURGERY    . CATARACT EXTRACTION Right 04/2006  . CHOLECYSTECTOMY    . COLONOSCOPY    . KNEE SURGERY Right    Medial meniscus tear  . LUMBAR LAMINECTOMY/DECOMPRESSION MICRODISCECTOMY Bilateral 11/13/2014   Procedure: Lumbar one-two Bilateral laminectomy and foraminotomy, Left Lumbar one-two microdiscectomy ;  Surgeon: Faythe Ghee, MD;  Location: [facility];  Service: Neurosurgery;  Laterality: Bilateral;  . NECK SURGERY     x 2;fusion  . PARTIAL COLECTOMY  09/1999   d/t diverticulitis  . TUBAL LIGATION     Social History   Tobacco Use  . Smoking status: Never Smoker  . Smokeless tobacco: Never Used  Vaping Use  . Vaping Use: Never used  Substance Use Topics  . Alcohol use: No    Alcohol/week: 0.0 standard drinks  . Drug use: Never   Family History  Problem Relation Age of Onset  . Cancer Father        throat and bladder  . Diabetes Father   . Hypertension Father   . Heart attack Father   . Stroke Mother   . Alzheimer's disease Mother   . Hypertension Mother    Allergies  Allergen Reactions  . Ace Inhibitors     REACTION: increased K+, increased creat.  . Bactrim [Sulfamethoxazole-Trimethoprim]     rash  . Cephalosporins     REACTION: reaction not known  . Colesevelam     REACTION: constipation  .  Hctz [Hydrochlorothiazide]     Makes her feel "bad"  . Losartan     Increased K  . Lovastatin     REACTION: doesn't work  . Raloxifene     REACTION: cramps  . Rosuvastatin     REACTION: myalgia  . Simvastatin     REACTION: myalgia   Current Outpatient Medications on File Prior to Visit  Medication Sig  Dispense Refill  . amLODipine (NORVASC) 10 MG tablet Take 1 tablet (10 mg total) by mouth daily. 90 tablet 1  . aspirin EC 81 MG tablet Take 1 tablet (81 mg total) by mouth daily. 90 tablet 3  . Calcium Carbonate-Vitamin D (CALCIUM 500 + D) 500-125 MG-UNIT TABS Take by mouth.    . Cranberry 250 MG CAPS Take 1 capsule by mouth daily.      Marland Kitchen docusate sodium (COLACE) 100 MG capsule Take 1-2 capsules (100-200 mg total) by mouth 2 (two) times daily. 100 mg in the morning and 200 mg at night 180 capsule 1  . famotidine (PEPCID) 20 MG tablet Take 1 tablet (20 mg total) by mouth 2 (two) times daily. 180 tablet 3  . fish oil-omega-3 fatty acids 1000 MG capsule Take 1 g by mouth at bedtime.     . furosemide (LASIX) 20 MG tablet TAKE 1 TABLET DAILY AS NEEDED FOR SWELLING 90 tablet 1  . hydrALAZINE (APRESOLINE) 10 MG tablet Take 1 tablet (10 mg total) by mouth daily. 90 tablet 1  . meclizine (ANTIVERT) 25 MG tablet Take 25 mg by mouth 4 (four) times daily as needed for dizziness.     Marland Kitchen omeprazole (PRILOSEC) 20 MG capsule Take 1 capsule (20 mg total) by mouth daily. 90 capsule 1  . pravastatin (PRAVACHOL) 20 MG tablet Take 1 tablet (20 mg total) by mouth daily. 90 tablet 1  . sertraline (ZOLOFT) 100 MG tablet Take 1 tablet (100 mg total) by mouth daily. 90 tablet 1  . Tetrahydrozoline HCl (EYE DROPS OP) Apply 1-2 drops to eye as needed (for dry eye).     No current facility-administered medications on file prior to visit.     Review of Systems  Constitutional: Positive for fatigue. Negative for activity change, appetite change, fever and unexpected weight change.       Episodes of fatigue and  generalized weakness  HENT: Negative for congestion, ear pain, rhinorrhea, sinus pressure and sore throat.   Eyes: Negative for pain, redness and visual disturbance.  Respiratory: Negative for cough, shortness of breath and wheezing.   Cardiovascular: Negative for chest pain and palpitations.  Gastrointestinal: Negative for abdominal pain, blood in stool, constipation and diarrhea.  Endocrine: Negative for polydipsia and polyuria.  Genitourinary: Negative for dysuria, frequency and urgency.  Musculoskeletal: Positive for arthralgias and back pain. Negative for myalgias.  Skin: Negative for pallor and rash.  Allergic/Immunologic: Negative for environmental allergies.  Neurological: Positive for weakness and numbness. Negative for dizziness, syncope and headaches.       Weakness/numbness in L hand from cervical injury/surgery  Hematological: Negative for adenopathy. Does not bruise/bleed easily.  Psychiatric/Behavioral: Positive for dysphoric mood. Negative for decreased concentration. The patient is not nervous/anxious.        Still exp grief from loss of husband and adjustment to being alone        Objective:   Physical Exam Constitutional:      General: She is not in acute distress.    Appearance: Normal appearance. She is well-developed and normal weight. She is not ill-appearing.     Comments: Frail appearing elderly female  HENT:     Head: Normocephalic and atraumatic.  Eyes:     Conjunctiva/sclera: Conjunctivae normal.     Pupils: Pupils are equal, round, and reactive to light.  Neck:     Thyroid: No thyromegaly.     Vascular: No carotid bruit or JVD.  Cardiovascular:     Rate  and Rhythm: Normal rate and regular rhythm.     Heart sounds: Normal heart sounds. No gallop.   Pulmonary:     Effort: Pulmonary effort is normal. No respiratory distress.     Breath sounds: Normal breath sounds. No wheezing or rales.  Abdominal:     General: Bowel sounds are normal. There is no  distension or abdominal bruit.     Palpations: Abdomen is soft. There is no mass.     Tenderness: There is no abdominal tenderness.  Musculoskeletal:     Cervical back: Normal range of motion and neck supple.  Lymphadenopathy:     Cervical: No cervical adenopathy.  Skin:    General: Skin is warm and dry.     Coloration: Skin is not pale.     Findings: No erythema or rash.  Neurological:     Mental Status: She is alert.     Sensory: Sensory deficit present.     Motor: Weakness present. No tremor.     Coordination: Coordination is intact.     Deep Tendon Reflexes: Reflexes are normal and symmetric.     Comments: L hand-loss of strength/unable to grip and decreased sensory function    Psychiatric:        Mood and Affect: Mood is anxious.        Cognition and Memory: Cognition and memory normal.     Comments: Mildly anxious  Pleasant  Candidly discusses her stressors            Assessment & Plan:   Problem List Items Addressed This Visit      Other   Prediabetes    Lab Results  Component Value Date   HGBA1C 5.3 11/17/2019   Eating less and now may be having some hypoglycemia Disc trying to increase calories with 6 small meals daily with protein       Generalized weakness - Primary    Improved after eating  Suspect some degree of hypoglycemia (past prediabetes) Plan to eat 6 smaller meals per day with protein-especially at bedtime Pt will track this and update [place] if no further improvement

## 2020-05-01 NOTE — Patient Instructions (Addendum)
I want you to start adding snacks in between meals Ensure Peanut butter Milk or yogurt or cottage cheese with fruit  Cookies are ok if you have some peanut butter or milk with them   A bedtime snack - with protein in addition to fruit or crackers or cookies is a good idea   Then- let us know if that does not continue to feel better   Follow up in September as planned   If symptoms worsen or change in the meantime please let me know

## 2020-05-10 DIAGNOSIS — R32 Unspecified urinary incontinence: Secondary | ICD-10-CM | POA: Diagnosis not present

## 2020-05-10 DIAGNOSIS — M792 Neuralgia and neuritis, unspecified: Secondary | ICD-10-CM | POA: Diagnosis not present

## 2020-05-10 DIAGNOSIS — K5900 Constipation, unspecified: Secondary | ICD-10-CM | POA: Diagnosis not present

## 2020-05-10 DIAGNOSIS — Z809 Family history of malignant neoplasm, unspecified: Secondary | ICD-10-CM | POA: Diagnosis not present

## 2020-05-10 DIAGNOSIS — I10 Essential (primary) hypertension: Secondary | ICD-10-CM | POA: Diagnosis not present

## 2020-05-10 DIAGNOSIS — R609 Edema, unspecified: Secondary | ICD-10-CM | POA: Diagnosis not present

## 2020-05-10 DIAGNOSIS — R42 Dizziness and giddiness: Secondary | ICD-10-CM | POA: Diagnosis not present

## 2020-05-10 DIAGNOSIS — K219 Gastro-esophageal reflux disease without esophagitis: Secondary | ICD-10-CM | POA: Diagnosis not present

## 2020-05-10 DIAGNOSIS — E785 Hyperlipidemia, unspecified: Secondary | ICD-10-CM | POA: Diagnosis not present

## 2020-05-10 DIAGNOSIS — R69 Illness, unspecified: Secondary | ICD-10-CM | POA: Diagnosis not present

## 2020-05-13 ENCOUNTER — Other Ambulatory Visit: Payer: Self-pay

## 2020-05-13 ENCOUNTER — Ambulatory Visit (INDEPENDENT_AMBULATORY_CARE_PROVIDER_SITE_OTHER): Payer: Medicare HMO | Admitting: Family Medicine

## 2020-05-13 VITALS — BP 140/60 | HR 80 | Temp 97.8°F | Ht 61.5 in | Wt 133.2 lb

## 2020-05-13 DIAGNOSIS — M4322 Fusion of spine, cervical region: Secondary | ICD-10-CM | POA: Diagnosis not present

## 2020-05-13 DIAGNOSIS — M4802 Spinal stenosis, cervical region: Secondary | ICD-10-CM

## 2020-05-13 DIAGNOSIS — R29898 Other symptoms and signs involving the musculoskeletal system: Secondary | ICD-10-CM | POA: Diagnosis not present

## 2020-05-13 DIAGNOSIS — M62532 Muscle wasting and atrophy, not elsewhere classified, left forearm: Secondary | ICD-10-CM | POA: Diagnosis not present

## 2020-05-13 DIAGNOSIS — M961 Postlaminectomy syndrome, not elsewhere classified: Secondary | ICD-10-CM

## 2020-05-13 MED ORDER — PREDNISONE 20 MG PO TABS: 20.0000 mg | ORAL_TABLET | Freq: Every day | ORAL | 0 refills | Status: DC

## 2020-05-13 NOTE — Patient Instructions (Signed)
You need to see Dr. Patrice Paradise

## 2020-05-13 NOTE — Progress Notes (Signed)
Sudeep Scheibel T. Shivangi Lutz, MD, [place] [facility] [place], [address]  Phone: [phone]  FAX: [place] - 86 y.o. female  MRN [id number]  Date of Birth: 12-Oct-1928  Date: 05/13/2020  PCP: Abner Greenspan, MD  Referral: Abner Greenspan, MD  Chief Complaint  Patient presents with  . Back Pain    Mid Back-surgery on 12/12/19-things have gotten worse since surgery  . Hand Pain    Left    This visit occurred during the SARS-CoV-2 public health emergency.  Safety protocols were in place, including screening questions prior to the visit, additional usage of staff PPE, and extensive cleaning of exam room while observing appropriate contact time as indicated for disinfecting solutions.   Subjective:   Linda Robinson is a 86 y.o. very pleasant female patient with Body mass index is 24.77 kg/m. who presents with the following:  I saw her on March 27, 2020.  At that point I recommended that she follow-up with her spine surgeon.  Unfortunately, she has not done that.  She continues to have neck pain, radicular pain as well as weakness in her left arm.  She is exceptionally frustrated.  She does have an upcoming EMG appointment and she also has an upcoming appointment with her spine surgeon.  Failed cervical spine surgery, 12/12/2019: Dr. Patrice Paradise L hand pain:  On her last appointment, I recommended for her to follow-up with her spine surgeon Dr. Patrice Paradise.  Has her EMG upcoming next week.  05/20/2020.  Ulnar - hurts with pinprick  03/27/2020 Last OV with Owens Loffler, MD  She is a LHD very nice lady who I recall quite well and I have seen a few times over the years.  Last time I saw her was approximately 9 months ago with some cervical radiculopathy.  This was fairly severe and she had radicular symptoms all the way down to her left hand.   She does have a history of having a  prior decompression and fusion, cervical, by Dr. Patrice Paradise in [place].   She is having some significant left-sided hand weakness.  She is not a very good historian, and I believe she was telling me that she had some weakness in her hand prior to surgery.  She does definitely have some muscular atrophy at the hand as well as the forearm on the left.   She has a follow-up appointment with her spine surgeon, Dr. Patrice Paradise, in about 6 weeks.  She also has follow-up for EMG nerve conduction velocities with [facility], which Dr. Patrice Paradise set up.   She currently not doing any OT.  She does have significant difficulty driving, and her husband just died.  She does not think she would be able to drive to go to occupational therapy.  She is only done some basic home rehab now.   She is a little bit confused about the exact surgical procedure that she had, she seems to not understand that she did have a bone graft.   8/16 appt with Dr. Patrice Paradise 8/23 - appt with EMG / NCV   12/12/2019 - fusion, ? About this.  She is somewhat confused about exactly what procedure she had.  It does appear that she had a revision of C7-T1 ACDF.   Hospital record:   Pertinent Hospital Course: The patient on 12/11/19 s/p elective C7-T1 ACDF (see below) by Dr. Patrice Paradise.   Per  M.D. Operative Note: Procedure:  1. Anterior cervical arthrodesis C7-T1 with placement of Titan titanium cage 2. Anterior cervical discectomy C7-T1 with wide decompression of the thecal sac and nerve roots bilaterally 3. Anterior cervical instrumentation utilizing the Medtronic Zevo plating system C7-T1 4. Harvest of anterior iliac crest bone graft from the left hip through a separate skin and fascial incision 5. Local autogenous bone graft supplemented with Master graft bone graft substitute 6. Removal of anterior cervical instrumentation from C6-7     L ext - 2-4 L fingers Also 1-5 flexion decreased    4-5 are the worst.   OT  - [employer].  Did not do hand  therapy   HH OT to work with her hand?   Full Operative Report:   [facility] OPERATIVE REPORT   Date of Surgery: December 12, 2019  Diagnosis:  1. C7-T1 foraminal stenosis 2. Cervical spondylotic radiculopathy 3. Status post history of C3 to C7 anterior cervical fusion 4. C7-T1 segmental kyphosis 5. Left C8 radiculopathy 6. C6-7 anterior cervical instrumentation  Procedure:  1. Anterior cervical arthrodesis C7-T1 with placement of Titan titanium cage 2. Anterior cervical discectomy C7-T1 with wide decompression of the thecal sac and nerve roots bilaterally 3. Anterior cervical instrumentation utilizing the Medtronic Zevo plating system C7-T1 4. Harvest of anterior iliac crest bone graft from the left hip through a separate skin and fascial incision 5. Local autogenous bone graft supplemented with Master graft bone graft substitute 6. Removal of anterior cervical instrumentation from C6-7  Surgeon: Starling Manns, MD  Assistant: Feliciana Rossetti, PA  Anesthesia: General Endotracheal   EBL: 50 mL  Complications: None  Needle and Sponge Count: Correct  Indications: Patient's a pleasant lady with history of multiple previous cervical surgeries resulting in anterior cervical fusion from C3-C7 with a plate at Q6-8. She has a severe persistent left C8 radiculopathy with weakness and pain. Imaging reveals severe left C7-T1 foraminal stenosis concordant with her symptomatology. She has failed to respond to appropriate conservative treatment options and now elects to undergo anterior cervical discectomy and fusion as listed above. Risks benefits and alternatives were reviewed and she elected to proceed. She expressed a good understanding.  Procedure: After informed consent he was taken to the operating room. Prophylactic IV antibiotics were administered. She was carefully positioned on the operating room table in the supine position. The head was supported on the  [place] horseshoe head rest. 5 pounds of halter traction applied. The arms were tucked at the side. Great care was taken in positioning. All bony prominences were padded. The neck and left iliac crest were prepped and draped in the usual sterile fashion.   Stab incision made over the left iliac. A bone harvester was used to collect several cord bone. This bone was saved for later autografting. Bleeding was controlled with FloSeal. Meticulous hemostasis achieved. Dermabond was applied. Sterile dressing applied.  We then turned our attention to the cervical spine. She had a scar on the left side of her neck just above the clavicle. The scar was reopened sharply and dissection was carried through the platysma. The interval between the SCM and strap muscles was made down to the anterior cervical spine. The esophagus trachea and carotid sheath were identified and protected at all times. There was scar from previous surgery which was carefully dissected. The anterior cervical plate at T4-1 could be palpated along with osteophyte at C7-T1. Deep shadow line retractor placed. The anterior osteophyte was removed at  C7-T1. In order to allow access to the C7-T1 level the plate at X1-0 was exposed. The screws were removed and found to be well fixed. The plate was then removed from the C6-7 level using a small osteotome. The underlying fusion appeared to be solid. Caspar distraction pins placed into the C7 and T1 vertebral bodies. Gentle distraction was applied. At this point the surgical microscope was draped and brought into the field. The remainder of the operation was done under microscopic magnification and illumination. The disc space was collapsed. Radical discectomy was completed. The disc material was degenerative and in fact there was very little disc within the interspace. The cartilaginous endplates were scraped clean. Using the high-speed drill the posterior vertebral margins of the uncovertebral joints were  thinned. Thin footed Kerrison punches were then used to complete the decompression. The posterior longitudinal ligament was taken down. The posterior vertebral margins were decompressed. Wide foraminotomies were completed. We confirmed a good decompression using a nerve hook. Bleeding was controlled with bipolar electrocautery and FloSeal. Once we were satisfied with the decompression I turned my attention to completing the arthrodesis. A 6 mm cage was trialed with a small footprint. This cage was packed with the iliac crest bone graft mixed with local autogenous bone graft from the decompression. The cage was countersunk 1 mm into the interspace. We then placed the Medtronic Zevo plate. We utilized four 13 millimeter locking screws. The fixation was fairly good considering the patient's age. The locking mechanisms were engaged. The wound was irrigated. Meticulous hemostasis was achieved. The wound was observed for 5 minutes confirming excellent hemostasis. The platysma closure using interrupted 2-0 Vicryl suture. Subcuticular layer closed with a 3-0 Vicryl suture followed by Dermabond on the skin edges. Intraoperative x-rays demonstrated the plate at G2-I9 however the patient's shoulders obscured good visualization. Further imaging will be obtained postoperatively. Patient was extubated without difficulty and transferred to recovery in good condition.  It should be noted my assistant Feliciana Rossetti, PA was present throughout the procedure. She assisted with the anterior cervical arthrodesis, the placement of the cage, the discectomy, the harvest of the iliac crest bone graft, the removal of the plate at S8-5, the placement of the anterior cervical plate at I6-E7 and she assisted with wound closure. It should be noted that the unique skill set up the physician assistant was necessary to safely complete this procedure under the surgical microscope. In addition, the surgical tech was not available to assist as  she was managing the back table.   Electronically signed by: Max Venia Minks, MD 12/12/19 1128     Review of Systems is noted in the HPI, as appropriate   Objective:   BP 140/60   Pulse 80   Temp 97.8 F (36.6 C) (Temporal)   Ht 5' 1.5" (1.562 m)   Wt 133 lb 4 oz (60.4 kg)   SpO2 98%   BMI 24.77 kg/m   GEN: No acute distress; alert,appropriate. PULM: Breathing comfortably in no respiratory distress PSYCH: Tearful and somewhat labile  She does have moderate restriction of motion at the neck.  She does have radicular pain throughout her left upper extremity.  Also with pinprick she has heightened pain throughout the upper extremity and this includes the hand as well.  She has obvious muscle wasting in the hand itself.  Her grip is decreased on the left quite a bit compared to the right.  Intact from a vascular standpoint.  Radiology: No results found.  Assessment and  Plan:     ICD-10-CM   1. Failed neck syndrome  M96.1 Ambulatory referral to Orthopedic Surgery  2. Left hand weakness  R29.898 Ambulatory referral to Orthopedic Surgery  3. Cervical vertebral fusion  M43.22 Ambulatory referral to Orthopedic Surgery  4. Atrophy of muscle of left forearm  M62.532 Ambulatory referral to Orthopedic Surgery  5. Cervical stenosis of spinal canal  M48.02 Ambulatory referral to Orthopedic Surgery   Longstanding cervical stenosis as well as hand weakness and failed neck syndrome all chronic and decompensated.  She is not doing well.  I continue to recommend that the patient see her spine surgeon.  I had my office call them directly and make her an appointment with her spine surgeon himself.  Dr. Patrice Paradise.  This is upcoming in September and she has EMG studies prior to this.  I gave her a course of some prednisone and hopefully this will help somewhat with her current symptoms.  I do not think I have anything to add in this case.  I will sign off.  Meds ordered this encounter    Medications  . predniSONE (DELTASONE) 20 MG tablet    Sig: Take 1 tablet (20 mg total) by mouth daily with breakfast.    Dispense:  7 tablet    Refill:  0   There are no discontinued medications. Orders Placed This Encounter  Procedures  . Ambulatory referral to Orthopedic Surgery    Signed,  Frederico Hamman T. Posey Jasmin, MD   Outpatient Encounter Medications as of 05/13/2020  Medication Sig  . amLODipine (NORVASC) 10 MG tablet Take 1 tablet (10 mg total) by mouth daily.  Marland Kitchen aspirin EC 81 MG tablet Take 1 tablet (81 mg total) by mouth daily.  . Calcium Carbonate-Vitamin D (CALCIUM 500 + D) 500-125 MG-UNIT TABS Take by mouth.  . Cranberry 250 MG CAPS Take 1 capsule by mouth daily.    Marland Kitchen docusate sodium (COLACE) 100 MG capsule Take 1-2 capsules (100-200 mg total) by mouth 2 (two) times daily. 100 mg in the morning and 200 mg at night  . famotidine (PEPCID) 20 MG tablet Take 1 tablet (20 mg total) by mouth 2 (two) times daily.  . fish oil-omega-3 fatty acids 1000 MG capsule Take 1 g by mouth at bedtime.   . furosemide (LASIX) 20 MG tablet TAKE 1 TABLET DAILY AS NEEDED FOR SWELLING  . hydrALAZINE (APRESOLINE) 10 MG tablet Take 1 tablet (10 mg total) by mouth daily.  . meclizine (ANTIVERT) 25 MG tablet Take 25 mg by mouth 4 (four) times daily as needed for dizziness.   Marland Kitchen omeprazole (PRILOSEC) 20 MG capsule Take 1 capsule (20 mg total) by mouth daily.  . pravastatin (PRAVACHOL) 20 MG tablet Take 1 tablet (20 mg total) by mouth daily.  . sertraline (ZOLOFT) 100 MG tablet Take 1 tablet (100 mg total) by mouth daily.  . Tetrahydrozoline HCl (EYE DROPS OP) Apply 1-2 drops to eye as needed (for dry eye).  . predniSONE (DELTASONE) 20 MG tablet Take 1 tablet (20 mg total) by mouth daily with breakfast.   No facility-administered encounter medications on file as of 05/13/2020.

## 2020-05-14 ENCOUNTER — Encounter: Payer: Self-pay | Admitting: Family Medicine

## 2020-05-15 ENCOUNTER — Encounter: Payer: Self-pay | Admitting: *Deleted

## 2020-05-16 DIAGNOSIS — M5013 Cervical disc disorder with radiculopathy, cervicothoracic region: Secondary | ICD-10-CM | POA: Diagnosis not present

## 2020-05-16 DIAGNOSIS — M4322 Fusion of spine, cervical region: Secondary | ICD-10-CM | POA: Diagnosis not present

## 2020-05-16 DIAGNOSIS — I10 Essential (primary) hypertension: Secondary | ICD-10-CM | POA: Diagnosis not present

## 2020-05-16 DIAGNOSIS — M542 Cervicalgia: Secondary | ICD-10-CM | POA: Diagnosis not present

## 2020-05-16 DIAGNOSIS — Z6824 Body mass index (BMI) 24.0-24.9, adult: Secondary | ICD-10-CM | POA: Diagnosis not present

## 2020-05-20 ENCOUNTER — Encounter: Payer: Self-pay | Admitting: Diagnostic Neuroimaging

## 2020-05-20 ENCOUNTER — Other Ambulatory Visit: Payer: Self-pay

## 2020-05-20 ENCOUNTER — Ambulatory Visit: Payer: Medicare HMO | Admitting: Diagnostic Neuroimaging

## 2020-05-20 VITALS — BP 167/70 | HR 68 | Ht 61.0 in | Wt 134.6 lb

## 2020-05-20 DIAGNOSIS — M5412 Radiculopathy, cervical region: Secondary | ICD-10-CM

## 2020-05-20 NOTE — Progress Notes (Signed)
[facility]  PATIENT: DARENE Robinson DOB: Oct 18, 1928  REFERRING CLINICIAN: Starling Manns, MD HISTORY FROM: patient  REASON FOR VISIT: new consult    HISTORICAL  CHIEF COMPLAINT:  Chief Complaint  Patient presents with  . Cervicalgia    rm 7 New Pt "pain and numbness in L hand/arm, unable to grasp anything with fingers, neck surgery 11/27/19 for bone spur; neck pain"    HISTORY OF PRESENT ILLNESS:   86 year old female here for evaluation of left arm pain.  Patient has history of multiple cervical spine surgeries from 1990s until March 2021.  Previously had cervical spine fusion from C4-C7, with instrumented C6-C7 level, and then underwent C7-T1 ACDF revision on 12/12/2019.  Prior to surgery on 12/12/2019 patient was having increasing pain and weakness radiating from her neck, left shoulder, left arm and left hand.  She had significant atrophy and weakness.  Therefore she underwent surgery on 12/12/2019.  Postoperatively patient felt significant worsening pain in her left tricep, left elbow, left forearm and left hand, especially digits 4 and 5.  Patient was referred here for consideration of EMG nerve conduction study to better localize symptoms.   REVIEW OF SYSTEMS: Full 14 system review of systems performed and negative with exception of: As per HPI.  ALLERGIES: Allergies  Allergen Reactions  . Ace Inhibitors     REACTION: increased K+, increased creat.  . Bactrim [Sulfamethoxazole-Trimethoprim]     rash  . Cephalosporins     REACTION: reaction not known  . Colesevelam     REACTION: constipation  . Hctz [Hydrochlorothiazide]     Makes her feel "bad"  . Losartan     Increased K  . Lovastatin     REACTION: doesn't work  . Raloxifene     REACTION: cramps  . Rosuvastatin     REACTION: myalgia  . Simvastatin     REACTION: myalgia    HOME MEDICATIONS: Outpatient Medications Prior to Visit  Medication Sig Dispense Refill  . amLODipine (NORVASC) 10  MG tablet Take 1 tablet (10 mg total) by mouth daily. 90 tablet 1  . aspirin EC 81 MG tablet Take 1 tablet (81 mg total) by mouth daily. 90 tablet 3  . Calcium Carbonate-Vitamin D (CALCIUM 500 + D) 500-125 MG-UNIT TABS Take by mouth.    . Cranberry 250 MG CAPS Take 1 capsule by mouth daily.      Marland Kitchen docusate sodium (COLACE) 100 MG capsule Take 1-2 capsules (100-200 mg total) by mouth 2 (two) times daily. 100 mg in the morning and 200 mg at night 180 capsule 1  . famotidine (PEPCID) 20 MG tablet Take 1 tablet (20 mg total) by mouth 2 (two) times daily. 180 tablet 3  . fish oil-omega-3 fatty acids 1000 MG capsule Take 1 g by mouth at bedtime.     . furosemide (LASIX) 20 MG tablet TAKE 1 TABLET DAILY AS NEEDED FOR SWELLING 90 tablet 1  . gabapentin (NEURONTIN) 100 MG capsule Take 100 mg by mouth 2 (two) times daily as needed.    . hydrALAZINE (APRESOLINE) 10 MG tablet Take 1 tablet (10 mg total) by mouth daily. 90 tablet 1  . meclizine (ANTIVERT) 25 MG tablet Take 25 mg by mouth 4 (four) times daily as needed for dizziness.     . methocarbamol (ROBAXIN) 500 MG tablet Take 500 mg by mouth 3 (three) times daily as needed.    Marland Kitchen omeprazole (PRILOSEC) 20 MG capsule Take 1 capsule (20 mg total)  by mouth daily. 90 capsule 1  . pravastatin (PRAVACHOL) 20 MG tablet Take 1 tablet (20 mg total) by mouth daily. 90 tablet 1  . sertraline (ZOLOFT) 100 MG tablet Take 1 tablet (100 mg total) by mouth daily. 90 tablet 1  . Tetrahydrozoline HCl (EYE DROPS OP) Apply 1-2 drops to eye as needed (for dry eye).    . predniSONE (DELTASONE) 20 MG tablet Take 1 tablet (20 mg total) by mouth daily with breakfast. 7 tablet 0   No facility-administered medications prior to visit.    PAST MEDICAL HISTORY: Past Medical History:  Diagnosis Date  . Ankylosis of lumbar spine   . Basal cell carcinoma    removed  . Cervical spine pain   . Chest pain    a. 02/2017 MV: EF 54%, small, distal anterior wall/apical infacrt (likely  misregistration), no ischemia-->Low risk; b. 02/2017 Echo: EF 55-60%, mild MR, PASP 68mmHg.  Marland Kitchen Chronic back pain    stenosis  . Constipation    takes Colace daily  . Depression    takes Zoloft daily  . Diverticulosis of colon   . GERD (gastroesophageal reflux disease)    atypical chest pain  . Gout    hx of;was on Allopurinol but taken off 6 wks ago by medical MD  . History of diverticulitis of colon   . HLD (hyperlipidemia)    takes Pravastatin daily  . HTN (hypertension)    takes Amlodipine and carvedilol  . Lumbar spondylosis with myelopathy   . Numbness   . OA (osteoarthritis)    right knee  . Osteopenia    takes Calcium and Vit D daily  . Scoliosis   . Vertigo    takes Antivert daily as needed    PAST SURGICAL HISTORY: Past Surgical History:  Procedure Laterality Date  . APPENDECTOMY  at age 75  . BACK SURGERY    . CATARACT EXTRACTION Right 04/2006  . CHOLECYSTECTOMY  1970  . COLONOSCOPY    . KNEE SURGERY Right    Medial meniscus tear  . LUMBAR LAMINECTOMY/DECOMPRESSION MICRODISCECTOMY Bilateral 11/13/2014   Procedure: Lumbar one-two Bilateral laminectomy and foraminotomy, Left Lumbar one-two microdiscectomy ;  Surgeon: Faythe Ghee, MD;  Location: [facility];  Service: Neurosurgery;  Laterality: Bilateral;  . NECK SURGERY  1990-2021   x 3; fusions  . PARTIAL COLECTOMY  09/1999   d/t diverticulitis  . TUBAL LIGATION      FAMILY HISTORY: Family History  Problem Relation Age of Onset  . Cancer Father        throat and bladder  . Diabetes Father   . Hypertension Father   . Heart attack Father   . Stroke Mother   . Alzheimer's disease Mother   . Hypertension Mother     SOCIAL HISTORY: Social History   Socioeconomic History  . Marital status: Widowed    Spouse name: Not on file  . Number of children: 3  . Years of education: 68  . Highest education level: Associate degree: occupational, Hotel manager, or vocational program  Occupational History  .  Occupation: Retired  Tobacco Use  . Smoking status: Never Smoker  . Smokeless tobacco: Never Used  Vaping Use  . Vaping Use: Never used  Substance and Sexual Activity  . Alcohol use: Never    Alcohol/week: 0.0 standard drinks  . Drug use: Never  . Sexual activity: Not Currently    Birth control/protection: Post-menopausal  Other Topics Concern  . Not on file  Social  History Narrative   Lives alone   Coffee 1/2 c   Social Determinants of Health   Financial Resource Strain: Low Risk   . Difficulty of Paying Living Expenses: Not hard at all  Food Insecurity: No Food Insecurity  . Worried About Charity fundraiser in the Last Year: Never true  . Ran Out of Food in the Last Year: Never true  Transportation Needs: No Transportation Needs  . Lack of Transportation (Medical): No  . Lack of Transportation (Non-Medical): No  Physical Activity: Inactive  . Days of Exercise per Week: 0 days  . Minutes of Exercise per Session: 0 min  Stress: No Stress Concern Present  . Feeling of Stress : Not at all  Social Connections:   . Frequency of Communication with Friends and Family: Not on file  . Frequency of Social Gatherings with Friends and Family: Not on file  . Attends Religious Services: Not on file  . Active Member of Clubs or Organizations: Not on file  . Attends Archivist Meetings: Not on file  . Marital Status: Not on file  Intimate Partner Violence: Not At Risk  . Fear of Current or Ex-Partner: No  . Emotionally Abused: No  . Physically Abused: No  . Sexually Abused: No     PHYSICAL EXAM  GENERAL EXAM/CONSTITUTIONAL: Vitals:  Vitals:   05/20/20 0836  BP: (!) 167/70  Pulse: 68  Weight: 134 lb 9.6 oz (61.1 kg)  Height: 5\' 1"  (1.549 m)     Body mass index is 25.43 kg/m. Wt Readings from Last 3 Encounters:  05/20/20 134 lb 9.6 oz (61.1 kg)  05/13/20 133 lb 4 oz (60.4 kg)  05/01/20 133 lb 1 oz (60.4 kg)     Patient is in no distress; well developed,  nourished and groomed; neck is supple  CARDIOVASCULAR:  Examination of carotid arteries is normal; no carotid bruits  Regular rate and rhythm, no murmurs  Examination of peripheral vascular system by observation and palpation is normal  EYES:  Ophthalmoscopic exam of optic discs and posterior segments is normal; no papilledema or hemorrhages  No exam data present  MUSCULOSKELETAL:  Gait, strength, tone, movements noted in Neurologic exam below  NEUROLOGIC: MENTAL STATUS:  MMSE - [place] Exam 05/26/2019 05/16/2018 05/11/2017  Orientation to time 4 5 5   Orientation to Place 5 5 5   Registration 3 3 3   Attention/ Calculation 5 0 0  Recall 2 3 3   Language- name 2 objects 0 0 0  Language- repeat 1 1 1   Language- follow 3 step command 0 3 3  Language- read & follow direction 0 0 0  Write a sentence 0 0 0  Copy design 0 0 0  Total score 20 20 20     awake, alert, oriented to person, place and time  recent and remote memory intact  normal attention and concentration  language fluent, comprehension intact, naming intact  fund of knowledge appropriate  CRANIAL NERVE:   2nd - no papilledema on fundoscopic exam  2nd, 3rd, 4th, 6th - pupils equal and reactive to light, visual fields full to confrontation, extraocular muscles intact, no nystagmus  5th - facial sensation symmetric  7th - facial strength symmetric  8th - hearing intact  9th - palate elevates symmetrically, uvula midline  11th - shoulder shrug symmetric  12th - tongue protrusion midline  MOTOR:   normal bulk and tone, full strength in the RUE, BLE  LUE --> TRICEPS 4, FINGER  ABDUCTION 2, FINGER EXTENSION 2-3 (DIGITS 4-5), LUMBRICALS 2, ABDUCTOR POLLICIS BREVIS 2, OPPONENS POLLICIS 2-3; ATROPHY OF INTRINSIC HAND MUSCLES AND HYPOTHENAR MUSCLES  SENSORY:   normal and symmetric to light touch, pinprick, temperature, vibration; EXCEPT DECR IN LEFT FOREARM AND HAND (ULNAR DISTRIBUTION; PARTIAL  MEDIAL ANTEBRACHIAL CUTANEOUS NERVE)  COORDINATION:   finger-nose-finger, fine finger movements normal  REFLEXES:   deep tendon reflexes TRACE and symmetric  GAIT/STATION:   narrow based gait     DIAGNOSTIC DATA (LABS, IMAGING, TESTING) - I reviewed patient records, labs, notes, testing and imaging myself where available.  Lab Results  Component Value Date   WBC 12.8 (H) 11/17/2019   HGB 14.2 11/17/2019   HCT 42.9 11/17/2019   MCV 86.8 11/17/2019   PLT 306 11/17/2019      Component Value Date/Time   NA 145 11/17/2019 1602   NA 140 03/30/2017 0930   K 3.5 11/17/2019 1602   CL 101 11/17/2019 1602   CO2 31 11/17/2019 1602   GLUCOSE 146 (H) 11/17/2019 1602   BUN 26 (H) 11/17/2019 1602   BUN 30 (H) 03/30/2017 0930   CREATININE 0.98 (H) 11/17/2019 1602   CALCIUM 9.4 11/17/2019 1602   PROT 6.1 11/17/2019 1602   ALBUMIN 4.5 05/25/2019 0925   AST 14 11/17/2019 1602   ALT 9 11/17/2019 1602   ALKPHOS 62 05/25/2019 0925   BILITOT 0.6 11/17/2019 1602   GFRNONAA 41 (L) 12/03/2018 1334   GFRAA 47 (L) 12/03/2018 1334   Lab Results  Component Value Date   CHOL 211 (H) 05/25/2019   HDL 68.80 05/25/2019   LDLCALC 117 (H) 05/25/2019   LDLDIRECT 135.4 04/11/2013   TRIG 124.0 05/25/2019   CHOLHDL 3 05/25/2019   Lab Results  Component Value Date   HGBA1C 5.3 11/17/2019   Lab Results  Component Value Date   ASNKNLZJ67 341 02/23/2017   Lab Results  Component Value Date   TSH 1.68 11/17/2019    11/11/19 MRI cervical spine 1. Unchanged advanced cervical disc and facet degeneration. 2. Mild spinal stenosis and moderate bilateral neural foraminal stenosis at C3-4. 3. Severe left neural foraminal stenosis at C7-T1. 4. Moderate to severe neural foraminal stenosis in the upper thoracic spine. 5. C4-C7 anterior fusion without residual compressive stenosis.   ASSESSMENT AND PLAN  86 y.o. year old female here with:  Dx: left C8-T1 radiculopathies  1. Cervical  radiculopathy at C8   2. Left cervical radiculopathy     PLAN:  Left cervical radiculopathies (C8-T1) - based on history and clinic neuro exam, this is consistent with left C8-T1 radiculopathies and not left ulnar neuropathy (in setting of 3 neck surgeies from [place] to 2021, post-op worsening of left hand pain since last surgery in March 2021, and weakness of non-ulnar innervated C8-T1 muscles in left hand) - EMG not likely to add clinical benefit for patient, as symptoms are already localized to cervical spine; in addition, based on severe atrophy, severe pain, duration of symptoms and multiple prior surgeries, electrodiagnostic testing and interpretation would be very challenging - continue pain mgmt per spine clinic  Return for pending if symptoms worsen or fail to improve, return to PCP.    Penni Bombard, MD 9/37/9024, 0:97 AM Certified in Neurology, Neurophysiology and Neuroimaging  [facility] [address], [place], [place] [address] [phone]

## 2020-05-29 ENCOUNTER — Ambulatory Visit: Payer: Medicare Other | Admitting: Neurology

## 2020-05-30 DIAGNOSIS — Z981 Arthrodesis status: Secondary | ICD-10-CM | POA: Diagnosis not present

## 2020-05-30 DIAGNOSIS — M961 Postlaminectomy syndrome, not elsewhere classified: Secondary | ICD-10-CM | POA: Diagnosis not present

## 2020-05-30 DIAGNOSIS — M5413 Radiculopathy, cervicothoracic region: Secondary | ICD-10-CM | POA: Diagnosis not present

## 2020-05-30 DIAGNOSIS — M5013 Cervical disc disorder with radiculopathy, cervicothoracic region: Secondary | ICD-10-CM | POA: Diagnosis not present

## 2020-06-04 ENCOUNTER — Telehealth: Payer: Self-pay | Admitting: Family Medicine

## 2020-06-04 DIAGNOSIS — I10 Essential (primary) hypertension: Secondary | ICD-10-CM

## 2020-06-04 DIAGNOSIS — E785 Hyperlipidemia, unspecified: Secondary | ICD-10-CM

## 2020-06-04 DIAGNOSIS — R7303 Prediabetes: Secondary | ICD-10-CM

## 2020-06-04 DIAGNOSIS — N289 Disorder of kidney and ureter, unspecified: Secondary | ICD-10-CM

## 2020-06-04 DIAGNOSIS — M1A9XX0 Chronic gout, unspecified, without tophus (tophi): Secondary | ICD-10-CM

## 2020-06-04 NOTE — Telephone Encounter (Signed)
-----   Message from Cloyd Stagers, RT sent at 05/21/2020  2:22 PM EDT ----- Regarding: Lab Orders for Wednesday 9.8.2021 Please place lab orders for Wednesday 9.8.2021, office visit for physical on  Wednesday 9.15.2021 Thank you, Dyke Maes RT(R)

## 2020-06-05 ENCOUNTER — Ambulatory Visit (INDEPENDENT_AMBULATORY_CARE_PROVIDER_SITE_OTHER): Payer: Medicare HMO

## 2020-06-05 ENCOUNTER — Other Ambulatory Visit (INDEPENDENT_AMBULATORY_CARE_PROVIDER_SITE_OTHER): Payer: Medicare HMO

## 2020-06-05 ENCOUNTER — Other Ambulatory Visit: Payer: Self-pay

## 2020-06-05 DIAGNOSIS — Z0000 Encounter for general adult medical examination without abnormal findings: Secondary | ICD-10-CM | POA: Diagnosis not present

## 2020-06-05 DIAGNOSIS — E785 Hyperlipidemia, unspecified: Secondary | ICD-10-CM | POA: Diagnosis not present

## 2020-06-05 DIAGNOSIS — R7303 Prediabetes: Secondary | ICD-10-CM | POA: Diagnosis not present

## 2020-06-05 DIAGNOSIS — N289 Disorder of kidney and ureter, unspecified: Secondary | ICD-10-CM | POA: Diagnosis not present

## 2020-06-05 DIAGNOSIS — I10 Essential (primary) hypertension: Secondary | ICD-10-CM | POA: Diagnosis not present

## 2020-06-05 LAB — COMPREHENSIVE METABOLIC PANEL
ALT: 8 U/L (ref 0–35)
AST: 16 U/L (ref 0–37)
Albumin: 4.5 g/dL (ref 3.5–5.2)
Alkaline Phosphatase: 59 U/L (ref 39–117)
BUN: 29 mg/dL — ABNORMAL HIGH (ref 6–23)
CO2: 33 mEq/L — ABNORMAL HIGH (ref 19–32)
Calcium: 9.8 mg/dL (ref 8.4–10.5)
Chloride: 101 mEq/L (ref 96–112)
Creatinine, Ser: 1.04 mg/dL (ref 0.40–1.20)
GFR: 49.86 mL/min — ABNORMAL LOW (ref >60.00)
Glucose, Bld: 86 mg/dL (ref 70–99)
Potassium: 4.7 mEq/L (ref 3.5–5.1)
Sodium: 143 mEq/L (ref 135–145)
Total Bilirubin: 0.7 mg/dL (ref 0.2–1.2)
Total Protein: 6.7 g/dL (ref 6.0–8.3)

## 2020-06-05 LAB — LIPID PANEL
Cholesterol: 199 mg/dL (ref 0–200)
HDL: 68.6 mg/dL (ref >39.00)
LDL Cholesterol: 108 mg/dL — ABNORMAL HIGH (ref 0–99)
NonHDL: 130.47
Total CHOL/HDL Ratio: 3
Triglycerides: 114 mg/dL (ref 0.0–149.0)
VLDL: 22.8 mg/dL (ref 0.0–40.0)

## 2020-06-05 LAB — CBC WITH DIFFERENTIAL/PLATELET
Basophils Absolute: 0 10*3/uL (ref 0.0–0.1)
Basophils Relative: 0.3 % (ref 0.0–3.0)
Eosinophils Absolute: 0.1 10*3/uL (ref 0.0–0.7)
Eosinophils Relative: 1.2 % (ref 0.0–5.0)
HCT: 43 % (ref 36.0–46.0)
Hemoglobin: 14.2 g/dL (ref 12.0–15.0)
Lymphocytes Relative: 39.4 % (ref 12.0–46.0)
Lymphs Abs: 4.7 10*3/uL — ABNORMAL HIGH (ref 0.7–4.0)
MCHC: 33 g/dL (ref 30.0–36.0)
MCV: 88.4 fl (ref 78.0–100.0)
Monocytes Absolute: 0.7 10*3/uL (ref 0.1–1.0)
Monocytes Relative: 5.7 % (ref 3.0–12.0)
Neutro Abs: 6.3 10*3/uL (ref 1.4–7.7)
Neutrophils Relative %: 53.4 % (ref 43.0–77.0)
Platelets: 263 10*3/uL (ref 150.0–400.0)
RBC: 4.87 Mil/uL (ref 3.87–5.11)
RDW: 13.7 % (ref 11.5–15.5)
WBC: 11.8 10*3/uL — ABNORMAL HIGH (ref 4.0–10.5)

## 2020-06-05 LAB — TSH: TSH: 3.12 u[IU]/mL (ref 0.35–4.50)

## 2020-06-05 LAB — HEMOGLOBIN A1C: Hgb A1c MFr Bld: 5.8 % (ref 4.6–6.5)

## 2020-06-05 NOTE — Progress Notes (Signed)
Subjective:   Linda Robinson is a 86 y.o. female who presents for Medicare Annual (Subsequent) preventive examination.  Review of Systems: N/A      I connected with the patient today by telephone and verified that I am speaking with the correct person using two identifiers. Location patient: home Location nurse: work Persons participating in the telephone visit: patient, nurse.   I discussed the limitations, risks, security and privacy concerns of performing an evaluation and management service by telephone and the availability of in person appointments. I also discussed with the patient that there may be a patient responsible charge related to this service. The patient expressed understanding and verbally consented to this telephonic visit.        Cardiac Risk Factors include: advanced age (>69men, >50 women);hypertension;dyslipidemia     Objective:    Today's Vitals   06/05/20 1200  PainSc: 7    There is no height or weight on file to calculate BMI.  Advanced Directives 06/05/2020 05/26/2019 05/16/2018 05/11/2017 03/06/2017 03/05/2017 07/07/2016  Does Patient Have a Medical Advance Directive? No No Yes Yes Yes Yes No  Type of Advance Directive - - Healthcare Power of [place]  Does patient want to make changes to medical advance directive? - - - - No - Patient declined No - Patient declined Yes - information given  Copy of [place] in Chart? - - No - copy requested No - copy requested No - copy requested - No - copy requested  Would patient like information on creating a medical advance directive? No - Patient declined - - - - - Yes - Educational materials given    Current Medications (verified) Outpatient Encounter Medications as of 06/05/2020  Medication Sig  . amLODipine (NORVASC) 10 MG tablet Take 1 tablet (10 mg total) by mouth daily.  Marland Kitchen aspirin EC 81 MG tablet Take 1  tablet (81 mg total) by mouth daily.  . Calcium Carbonate-Vitamin D (CALCIUM 500 + D) 500-125 MG-UNIT TABS Take by mouth.  . Cranberry 250 MG CAPS Take 1 capsule by mouth daily.    Marland Kitchen docusate sodium (COLACE) 100 MG capsule Take 1-2 capsules (100-200 mg total) by mouth 2 (two) times daily. 100 mg in the morning and 200 mg at night  . famotidine (PEPCID) 20 MG tablet Take 1 tablet (20 mg total) by mouth 2 (two) times daily.  . fish oil-omega-3 fatty acids 1000 MG capsule Take 1 g by mouth at bedtime.   . furosemide (LASIX) 20 MG tablet TAKE 1 TABLET DAILY AS NEEDED FOR SWELLING  . gabapentin (NEURONTIN) 100 MG capsule Take 100 mg by mouth 2 (two) times daily as needed.  . hydrALAZINE (APRESOLINE) 10 MG tablet Take 1 tablet (10 mg total) by mouth daily.  . meclizine (ANTIVERT) 25 MG tablet Take 25 mg by mouth 4 (four) times daily as needed for dizziness.   . methocarbamol (ROBAXIN) 500 MG tablet Take 500 mg by mouth 3 (three) times daily as needed.  Marland Kitchen omeprazole (PRILOSEC) 20 MG capsule Take 1 capsule (20 mg total) by mouth daily.  . pravastatin (PRAVACHOL) 20 MG tablet Take 1 tablet (20 mg total) by mouth daily.  . sertraline (ZOLOFT) 100 MG tablet Take 1 tablet (100 mg total) by mouth daily.  . Tetrahydrozoline HCl (EYE DROPS OP) Apply 1-2 drops to eye as needed (for dry eye).   No facility-administered encounter medications on  file as of 06/05/2020.    Allergies (verified) Ace inhibitors, Bactrim [sulfamethoxazole-trimethoprim], Cephalosporins, Colesevelam, Hctz [hydrochlorothiazide], Losartan, Lovastatin, Raloxifene, Rosuvastatin, and Simvastatin   History: Past Medical History:  Diagnosis Date  . Ankylosis of lumbar spine   . Basal cell carcinoma    removed  . Cervical spine pain   . Chest pain    a. 02/2017 MV: EF 54%, small, distal anterior wall/apical infacrt (likely misregistration), no ischemia-->Low risk; b. 02/2017 Echo: EF 55-60%, mild MR, PASP 47mmHg.  Marland Kitchen Chronic back pain     stenosis  . Constipation    takes Colace daily  . Depression    takes Zoloft daily  . Diverticulosis of colon   . GERD (gastroesophageal reflux disease)    atypical chest pain  . Gout    hx of;was on Allopurinol but taken off 6 wks ago by medical MD  . History of diverticulitis of colon   . HLD (hyperlipidemia)    takes Pravastatin daily  . HTN (hypertension)    takes Amlodipine and carvedilol  . Lumbar spondylosis with myelopathy   . Numbness   . OA (osteoarthritis)    right knee  . Osteopenia    takes Calcium and Vit D daily  . Scoliosis   . Vertigo    takes Antivert daily as needed   Past Surgical History:  Procedure Laterality Date  . APPENDECTOMY  at age 89  . BACK SURGERY    . CATARACT EXTRACTION Right 04/2006  . CHOLECYSTECTOMY  1970  . COLONOSCOPY    . KNEE SURGERY Right    Medial meniscus tear  . LUMBAR LAMINECTOMY/DECOMPRESSION MICRODISCECTOMY Bilateral 11/13/2014   Procedure: Lumbar one-two Bilateral laminectomy and foraminotomy, Left Lumbar one-two microdiscectomy ;  Surgeon: Faythe Ghee, MD;  Location: [facility];  Service: Neurosurgery;  Laterality: Bilateral;  . NECK SURGERY  1990-2021   x 3; fusions  . PARTIAL COLECTOMY  09/1999   d/t diverticulitis  . TUBAL LIGATION     Family History  Problem Relation Age of Onset  . Cancer Father        throat and bladder  . Diabetes Father   . Hypertension Father   . Heart attack Father   . Stroke Mother   . Alzheimer's disease Mother   . Hypertension Mother    Social History   Socioeconomic History  . Marital status: Widowed    Spouse name: Not on file  . Number of children: 3  . Years of education: 66  . Highest education level: Associate degree: occupational, Hotel manager, or vocational program  Occupational History  . Occupation: Retired  Tobacco Use  . Smoking status: Never Smoker  . Smokeless tobacco: Never Used  Vaping Use  . Vaping Use: Never used  Substance and Sexual Activity  .  Alcohol use: Never    Alcohol/week: 0.0 standard drinks  . Drug use: Never  . Sexual activity: Not Currently    Birth control/protection: Post-menopausal  Other Topics Concern  . Not on file  Social History Narrative   Lives alone   Coffee 1/2 c   Social Determinants of Health   Financial Resource Strain:   . Difficulty of Paying Living Expenses: Not on file  Food Insecurity:   . Worried About Charity fundraiser in the Last Year: Not on file  . Ran Out of Food in the Last Year: Not on file  Transportation Needs:   . Lack of Transportation (Medical): Not on file  . Lack of  Transportation (Non-Medical): Not on file  Physical Activity:   . Days of Exercise per Week: Not on file  . Minutes of Exercise per Session: Not on file  Stress:   . Feeling of Stress : Not on file  Social Connections:   . Frequency of Communication with Friends and Family: Not on file  . Frequency of Social Gatherings with Friends and Family: Not on file  . Attends Religious Services: Not on file  . Active Member of Clubs or Organizations: Not on file  . Attends Archivist Meetings: Not on file  . Marital Status: Not on file    Tobacco Counseling Counseling given: Not Answered   Clinical Intake:  Pre-visit preparation completed: Yes  Pain : 0-10 Pain Score: 7  Pain Type: Chronic pain Pain Location: Arm Pain Orientation: Left Pain Descriptors / Indicators: Aching Pain Onset: More than a month ago Pain Frequency: Intermittent     Nutritional Risks: None Diabetes: No  How often do you need to have someone help you when you read instructions, pamphlets, or other written materials from your doctor or pharmacy?: 1 - Never What is the last grade level you completed in school?: some college  Diabetic: No Nutrition Risk Assessment:  Has the patient had any N/V/D within the last 2 months?  No  Does the patient have any non-healing wounds?  No  Has the patient had any unintentional  weight loss or weight gain?  No   Diabetes:  Is the patient diabetic?  No  If diabetic, was a CBG obtained today?  N/A Did the patient bring in their glucometer from home?  N/A How often do you monitor your CBG's? N/A.   Financial Strains and Diabetes Management:  Are you having any financial strains with the device, your supplies or your medication? N/A.  Does the patient want to be seen by Chronic Care Management for management of their diabetes?  N/A Would the patient like to be referred to a Nutritionist or for Diabetic Management?  N/A    Interpreter Needed?: No  Information entered by :: CJohnson, LPN   Activities of Daily Living In your present state of health, do you have any difficulty performing the following activities: 06/05/2020 01/23/2020  Hearing? Y Y  Comment right ear loss of hearing -  Vision? N N  Difficulty concentrating or making decisions? N N  Walking or climbing stairs? N N  Dressing or bathing? N N  Doing errands, shopping? N N  Preparing Food and eating ? N -  Using the Toilet? N -  In the past six months, have you accidently leaked urine? Y -  Comment wears pads only when going out -  Do you have problems with loss of bowel control? N -  Managing your Medications? N -  Managing your Finances? N -  Housekeeping or managing your Housekeeping? N -  Some recent data might be hidden    Patient Care Team: Tower, Wynelle Fanny, MD as PCP - General Starling Manns, MD as Consulting Physician (Orthopedic Surgery)  Indicate any recent Medical Services you may have received from other than [facility] providers in the past year (date may be approximate).     Assessment:   This is a routine wellness examination for [place].  Hearing/Vision screen  Hearing Screening   125Hz  250Hz  500Hz  1000Hz  2000Hz  3000Hz  4000Hz  6000Hz  8000Hz   Right ear:           Left ear:  Vision Screening Comments: Patient gets annual eye exams   Dietary issues and exercise activities  discussed: Current Exercise Habits: Home exercise routine, Type of exercise: stretching, Time (Minutes): 10, Frequency (Times/Week): 7, Weekly Exercise (Minutes/Week): 70, Intensity: Mild, Exercise limited by: None identified  Goals    . Increase water intake     Starting 05/16/2018, I will continue to drink at least 6-8 glasses of water daily.     . Patient Stated     05/26/2019, wants to stop urinary leakage    . Patient Stated     06/05/2020, I will maintain and continue medications as prescribed.       Depression Screen PHQ 2/9 Scores 06/05/2020 11/17/2019 05/26/2019 05/16/2018 05/11/2017 07/07/2016 07/05/2015  PHQ - 2 Score 0 2 0 0 0 0 0  PHQ- 9 Score 0 4 3 0 3 - -    Fall Risk Fall Risk  06/05/2020 01/23/2020 05/26/2019 05/16/2018 05/11/2017  Falls in the past year? 0 0 1 Yes Yes  Comment - - 3-4 falls loses balance, once tripped over something 1 fall in home after tripping over cord, 2 fall in garden after tripping over vines pt fell on back porch; injury to right eye  Number falls in past yr: 0 0 1 2 or more 1  Injury with Fall? 0 0 1 No Yes  Comment - - black eyes, fractured orbit - -  Risk for fall due to : Medication side effect Impaired balance/gait;Impaired mobility History of fall(s);Impaired balance/gait;Medication side effect Impaired balance/gait;Impaired mobility;History of fall(s) -  Follow up Falls evaluation completed;Falls prevention discussed - Falls evaluation completed;Falls prevention discussed - -    Any stairs in or around the home? Yes  If so, are there any without handrails? No  Home free of loose throw rugs in walkways, pet beds, electrical cords, etc? Yes  Adequate lighting in your home to reduce risk of falls? Yes   ASSISTIVE DEVICES UTILIZED TO PREVENT FALLS:  Life alert? No  Use of a cane, walker or w/c? Yes  Grab bars in the bathroom? No  Shower chair or bench in shower? No  Elevated toilet seat or a handicapped toilet? No   TIMED UP AND GO:  Was the  test performed? N/A, telephonic visit .    Cognitive Function: MMSE - Mini Mental State Exam 06/05/2020 05/26/2019 05/16/2018 05/11/2017 07/07/2016  Not completed: Refused - - - -  Orientation to time - 4 5 5 5   Orientation to Place - 5 5 5 5   Registration - 3 3 3 3   Attention/ Calculation - 5 0 0 0  Recall - 2 3 3 3   Language- name 2 objects - 0 0 0 0  Language- repeat - 1 1 1 1   Language- follow 3 step command - 0 3 3 3   Language- read & follow direction - 0 0 0 0  Write a sentence - 0 0 0 0  Copy design - 0 0 0 0  Total score - 20 20 20 20   Mini Cog  Mini-Cog screen was completed. Maximum score is 22. A value of 0 denotes this part of the MMSE was not completed or the patient failed this part of the Mini-Cog screening.       Immunizations Immunization History  Administered Date(s) Administered  . Influenza Split 07/23/2011, 07/01/2017  . Influenza Whole 08/11/2004, 07/08/2007, 07/09/2008, 06/19/2009, 07/08/2010  . Influenza, High Dose Seasonal PF 07/31/2015, 06/30/2016, 07/01/2017, 06/13/2018, 05/15/2019  . Influenza-Unspecified 06/28/2013, 08/05/2015  .  Moderna SARS-COVID-2 Vaccination 12/24/2019, 01/21/2020  . PPD Test 10/03/2015  . Pneumococcal Conjugate-13 06/27/2014  . Pneumococcal Polysaccharide-23 02/10/2012  . Td 08/28/1997, 02/10/2012     TDAP status: Up to date Flu Vaccine status: due, will get at upcoming physical  Pneumococcal vaccine status: Up to date Covid-19 vaccine status: Completed vaccines  Qualifies for Shingles Vaccine? Yes   Zostavax completed No   Shingrix Completed?: No.    Education has been provided regarding the importance of this vaccine. Patient has been advised to call insurance company to determine out of pocket expense if they have not yet received this vaccine. Advised may also receive vaccine at local pharmacy or Health Dept. Verbalized acceptance and understanding.  Screening Tests Health Maintenance  Topic Date Due  . MAMMOGRAM   06/09/2019  . INFLUENZA VACCINE  04/28/2020  . TETANUS/TDAP  02/09/2022  . DEXA SCAN  Completed  . COVID-19 Vaccine  Completed  . PNA vac Low Risk Adult  Completed    Health Maintenance  Health Maintenance Due  Topic Date Due  . MAMMOGRAM  06/09/2019  . INFLUENZA VACCINE  04/28/2020    Colorectal cancer screening: No longer required.  Mammogram status: No longer required.  Bone Density status: due, will discuss with provider   Lung Cancer Screening: (Low Dose CT Chest recommended if Age 11-80 years, 30 pack-year currently smoking OR have quit w/in 15 years.) does not qualify.    Additional Screening:  Hepatitis C Screening: does not qualify; Completed N/A  Vision Screening: Recommended annual ophthalmology exams for early detection of glaucoma and other disorders of the eye. Is the patient up to date with their annual eye exam?  Yes  Who is the provider or what is the name of the office in which the patient attends annual eye exams? Dr. Marvel Plan  If pt is not established with a provider, would they like to be referred to a provider to establish care? No .   Dental Screening: Recommended annual dental exams for proper oral hygiene  Community Resource Referral / Chronic Care Management: CRR required this visit?  No   CCM required this visit?  No      Plan:     I have personally reviewed and noted the following in the patient's chart:   . Medical and social history . Use of alcohol, tobacco or illicit drugs  . Current medications and supplements . Functional ability and status . Nutritional status . Physical activity . Advanced directives . List of other physicians . Hospitalizations, surgeries, and ER visits in previous 12 months . Vitals . Screenings to include cognitive, depression, and falls . Referrals and appointments  In addition, I have reviewed and discussed with patient certain preventive protocols, quality metrics, and best practice  recommendations. A written personalized care plan for preventive services as well as general preventive health recommendations were provided to patient.   Due to this being a telephonic visit, the after visit summary with patients personalized plan was offered to patient via mail or my-chart. Patient preferred to pick up at office at next visit.   Andrez Grime, LPN   [id number]

## 2020-06-05 NOTE — Progress Notes (Signed)
PCP notes:  Health Maintenance: Flu- due Dexa- due   Abnormal Screenings: none   Patient concerns: none   Nurse concerns: none   Next PCP appt.: 06/12/2020 @ 11:30 am

## 2020-06-05 NOTE — Patient Instructions (Signed)
Linda Robinson , Thank you for taking time to come for your Medicare Wellness Visit. I appreciate your ongoing commitment to your health goals. Please review the following plan we discussed and let me know if I can assist you in the future.   Screening recommendations/referrals: Colonoscopy: no longer required Mammogram: no longer required Bone Density: due, will discuss with provider Recommended yearly ophthalmology/optometry visit for glaucoma screening and checkup Recommended yearly dental visit for hygiene and checkup  Vaccinations: Influenza vaccine: due, will get at upcoming physical Pneumococcal vaccine: Completed series Tdap vaccine: Up to date, completed 02/10/2012, due 01/2022 Shingles vaccine: due, check with your insurance regarding coverage if interested   Covid-19:Completed series  Advanced directives: Advance directive discussed with you today. Even though you declined this today please call our office should you change your mind and we can give you the proper paperwork for you to fill out.  Conditions/risks identified: hypertension, hyperlipidemia  Next appointment: Follow up in one year for your annual wellness visit    Preventive Care 40 Years and Older, Female Preventive care refers to lifestyle choices and visits with your health care provider that can promote health and wellness. What does preventive care include?  A yearly physical exam. This is also called an annual well check.  Dental exams once or twice a year.  Routine eye exams. Ask your health care provider how often you should have your eyes checked.  Personal lifestyle choices, including:  Daily care of your teeth and gums.  Regular physical activity.  Eating a healthy diet.  Avoiding tobacco and drug use.  Limiting alcohol use.  Practicing safe sex.  Taking low-dose aspirin every day.  Taking vitamin and mineral supplements as recommended by your health care provider. What happens during an  annual well check? The services and screenings done by your health care provider during your annual well check will depend on your age, overall health, lifestyle risk factors, and family history of disease. Counseling  Your health care provider may ask you questions about your:  Alcohol use.  Tobacco use.  Drug use.  Emotional well-being.  Home and relationship well-being.  Sexual activity.  Eating habits.  History of falls.  Memory and ability to understand (cognition).  Work and work Statistician.  Reproductive health. Screening  You may have the following tests or measurements:  Height, weight, and BMI.  Blood pressure.  Lipid and cholesterol levels. These may be checked every 5 years, or more frequently if you are over 31 years old.  Skin check.  Lung cancer screening. You may have this screening every year starting at age 62 if you have a 30-pack-year history of smoking and currently smoke or have quit within the past 15 years.  Fecal occult blood test (FOBT) of the stool. You may have this test every year starting at age 82.  Flexible sigmoidoscopy or colonoscopy. You may have a sigmoidoscopy every 5 years or a colonoscopy every 10 years starting at age 45.  Hepatitis C blood test.  Hepatitis B blood test.  Sexually transmitted disease (STD) testing.  Diabetes screening. This is done by checking your blood sugar (glucose) after you have not eaten for a while (fasting). You may have this done every 1-3 years.  Bone density scan. This is done to screen for osteoporosis. You may have this done starting at age 33.  Mammogram. This may be done every 1-2 years. Talk to your health care provider about how often you should have regular mammograms. Talk  with your health care provider about your test results, treatment options, and if necessary, the need for more tests. Vaccines  Your health care provider may recommend certain vaccines, such as:  Influenza  vaccine. This is recommended every year.  Tetanus, diphtheria, and acellular pertussis (Tdap, Td) vaccine. You may need a Td booster every 10 years.  Zoster vaccine. You may need this after age 64.  Pneumococcal 13-valent conjugate (PCV13) vaccine. One dose is recommended after age 11.  Pneumococcal polysaccharide (PPSV23) vaccine. One dose is recommended after age 68. Talk to your health care provider about which screenings and vaccines you need and how often you need them. This information is not intended to replace advice given to you by your health care provider. Make sure you discuss any questions you have with your health care provider. Document Released: 10/11/2015 Document Revised: 06/03/2016 Document Reviewed: 07/16/2015 Elsevier Interactive Patient Education  2017 [place] Prevention in the Home Falls can cause injuries. They can happen to people of all ages. There are many things you can do to make your home safe and to help prevent falls. What can I do on the outside of my home?  Regularly fix the edges of walkways and driveways and fix any cracks.  Remove anything that might make you trip as you walk through a door, such as a raised step or threshold.  Trim any bushes or trees on the path to your home.  Use bright outdoor lighting.  Clear any walking paths of anything that might make someone trip, such as rocks or tools.  Regularly check to see if handrails are loose or broken. Make sure that both sides of any steps have handrails.  Any raised decks and porches should have guardrails on the edges.  Have any leaves, snow, or ice cleared regularly.  Use sand or salt on walking paths during winter.  Clean up any spills in your garage right away. This includes oil or grease spills. What can I do in the bathroom?  Use night lights.  Install grab bars by the toilet and in the tub and shower. Do not use towel bars as grab bars.  Use non-skid mats or decals  in the tub or shower.  If you need to sit down in the shower, use a plastic, non-slip stool.  Keep the floor dry. Clean up any water that spills on the floor as soon as it happens.  Remove soap buildup in the tub or shower regularly.  Attach bath mats securely with double-sided non-slip rug tape.  Do not have throw rugs and other things on the floor that can make you trip. What can I do in the bedroom?  Use night lights.  Make sure that you have a light by your bed that is easy to reach.  Do not use any sheets or blankets that are too big for your bed. They should not hang down onto the floor.  Have a firm chair that has side arms. You can use this for support while you get dressed.  Do not have throw rugs and other things on the floor that can make you trip. What can I do in the kitchen?  Clean up any spills right away.  Avoid walking on wet floors.  Keep items that you use a lot in easy-to-reach places.  If you need to reach something above you, use a strong step stool that has a grab bar.  Keep electrical cords out of the way.  Do  not use floor polish or wax that makes floors slippery. If you must use wax, use non-skid floor wax.  Do not have throw rugs and other things on the floor that can make you trip. What can I do with my stairs?  Do not leave any items on the stairs.  Make sure that there are handrails on both sides of the stairs and use them. Fix handrails that are broken or loose. Make sure that handrails are as long as the stairways.  Check any carpeting to make sure that it is firmly attached to the stairs. Fix any carpet that is loose or worn.  Avoid having throw rugs at the top or bottom of the stairs. If you do have throw rugs, attach them to the floor with carpet tape.  Make sure that you have a light switch at the top of the stairs and the bottom of the stairs. If you do not have them, ask someone to add them for you. What else can I do to help  prevent falls?  Wear shoes that:  Do not have high heels.  Have rubber bottoms.  Are comfortable and fit you well.  Are closed at the toe. Do not wear sandals.  If you use a stepladder:  Make sure that it is fully opened. Do not climb a closed stepladder.  Make sure that both sides of the stepladder are locked into place.  Ask someone to hold it for you, if possible.  Clearly mark and make sure that you can see:  Any grab bars or handrails.  First and last steps.  Where the edge of each step is.  Use tools that help you move around (mobility aids) if they are needed. These include:  Canes.  Walkers.  Scooters.  Crutches.  Turn on the lights when you go into a dark area. Replace any light bulbs as soon as they burn out.  Set up your furniture so you have a clear path. Avoid moving your furniture around.  If any of your floors are uneven, fix them.  If there are any pets around you, be aware of where they are.  Review your medicines with your doctor. Some medicines can make you feel dizzy. This can increase your chance of falling. Ask your doctor what other things that you can do to help prevent falls. This information is not intended to replace advice given to you by your health care provider. Make sure you discuss any questions you have with your health care provider. Document Released: 07/11/2009 Document Revised: 02/20/2016 Document Reviewed: 10/19/2014 Elsevier Interactive Patient Education  2017 [employer].

## 2020-06-11 ENCOUNTER — Telehealth: Payer: Self-pay | Admitting: Nurse Practitioner

## 2020-06-11 ENCOUNTER — Other Ambulatory Visit: Payer: Self-pay | Admitting: Orthopaedic Surgery

## 2020-06-11 DIAGNOSIS — M5013 Cervical disc disorder with radiculopathy, cervicothoracic region: Secondary | ICD-10-CM

## 2020-06-11 NOTE — Telephone Encounter (Signed)
Phone call to patient to verify medication list and allergies for myelogram procedure. Pt instructed to hold sertraline for 48hrs prior to myelogram appointment time. Pt verbalized understanding. Pre and post procedure instructions reviewed with pt.

## 2020-06-12 ENCOUNTER — Other Ambulatory Visit: Payer: Self-pay

## 2020-06-12 ENCOUNTER — Ambulatory Visit (INDEPENDENT_AMBULATORY_CARE_PROVIDER_SITE_OTHER): Payer: Medicare HMO | Admitting: Family Medicine

## 2020-06-12 ENCOUNTER — Encounter: Payer: Self-pay | Admitting: Family Medicine

## 2020-06-12 VITALS — BP 136/82 | HR 72 | Temp 97.3°F | Ht 60.75 in | Wt 133.0 lb

## 2020-06-12 DIAGNOSIS — R7303 Prediabetes: Secondary | ICD-10-CM

## 2020-06-12 DIAGNOSIS — M8589 Other specified disorders of bone density and structure, multiple sites: Secondary | ICD-10-CM

## 2020-06-12 DIAGNOSIS — N289 Disorder of kidney and ureter, unspecified: Secondary | ICD-10-CM | POA: Diagnosis not present

## 2020-06-12 DIAGNOSIS — H906 Mixed conductive and sensorineural hearing loss, bilateral: Secondary | ICD-10-CM

## 2020-06-12 DIAGNOSIS — E785 Hyperlipidemia, unspecified: Secondary | ICD-10-CM | POA: Diagnosis not present

## 2020-06-12 DIAGNOSIS — Z0000 Encounter for general adult medical examination without abnormal findings: Secondary | ICD-10-CM

## 2020-06-12 DIAGNOSIS — F418 Other specified anxiety disorders: Secondary | ICD-10-CM

## 2020-06-12 DIAGNOSIS — R531 Weakness: Secondary | ICD-10-CM

## 2020-06-12 DIAGNOSIS — I10 Essential (primary) hypertension: Secondary | ICD-10-CM | POA: Diagnosis not present

## 2020-06-12 DIAGNOSIS — K219 Gastro-esophageal reflux disease without esophagitis: Secondary | ICD-10-CM | POA: Diagnosis not present

## 2020-06-12 DIAGNOSIS — R69 Illness, unspecified: Secondary | ICD-10-CM | POA: Diagnosis not present

## 2020-06-12 MED ORDER — AMLODIPINE BESYLATE 10 MG PO TABS
10.0000 mg | ORAL_TABLET | Freq: Every day | ORAL | 3 refills | Status: DC
Start: 2020-06-12 — End: 2021-06-10

## 2020-06-12 MED ORDER — PRAVASTATIN SODIUM 20 MG PO TABS
20.0000 mg | ORAL_TABLET | Freq: Every day | ORAL | 3 refills | Status: DC
Start: 2020-06-12 — End: 2021-06-10

## 2020-06-12 MED ORDER — FAMOTIDINE 20 MG PO TABS
20.0000 mg | ORAL_TABLET | Freq: Two times a day (BID) | ORAL | 3 refills | Status: DC
Start: 2020-06-12 — End: 2021-05-19

## 2020-06-12 MED ORDER — SERTRALINE HCL 100 MG PO TABS
100.0000 mg | ORAL_TABLET | Freq: Every day | ORAL | 3 refills | Status: DC
Start: 2020-06-12 — End: 2021-06-10

## 2020-06-12 MED ORDER — FUROSEMIDE 20 MG PO TABS
ORAL_TABLET | ORAL | 1 refills | Status: DC
Start: 2020-06-12 — End: 2020-12-18

## 2020-06-12 MED ORDER — HYDRALAZINE HCL 10 MG PO TABS
10.0000 mg | ORAL_TABLET | Freq: Every day | ORAL | 3 refills | Status: DC
Start: 2020-06-12 — End: 2021-02-03

## 2020-06-12 MED ORDER — OMEPRAZOLE 20 MG PO CPDR
20.0000 mg | DELAYED_RELEASE_CAPSULE | Freq: Every day | ORAL | 3 refills | Status: DC
Start: 2020-06-12 — End: 2021-06-10

## 2020-06-12 NOTE — Progress Notes (Signed)
Subjective:    Patient ID: Linda Robinson, female    DOB: Mar 26, 1929, 86 y.o.   MRN: [id number]  This visit occurred during the SARS-CoV-2 public health emergency.  Safety protocols were in place, including screening questions prior to the visit, additional usage of staff PPE, and extensive cleaning of exam room while observing appropriate contact time as indicated for disinfecting solutions.    HPI Here for health maintenance exam and to review chronic medical problems    Wt Readings from Last 3 Encounters:  06/12/20 133 lb (60.3 kg)  05/20/20 134 lb 9.6 oz (61.1 kg)  05/13/20 133 lb 4 oz (60.4 kg)   25.34 kg/m  L hand continues to be weak/hard to use (failed cervical surg)  Has seen neurology and surg  She has not seen Dr Patrice Paradise in person   She takes gabapentin as needed   Planning a CT to look for area with nerve damage   Knees hurt   She had amw on 9/8 Noted due for flu shot and dexa if desired  She declined flu shot here-plans to get at [facility]   Is covid immunized L[place]a [place])  Mammogram 9/19 - does not want to do at her age  Self breast exam -no lumps or changes   dexa 10/16- declines (too hard to get transportation)  Fosamax in the past  Falls -none Fractures-none Supplements -takes ca and D  Exercise -limited by knee pain  Well care came and did some PT which were helpful -does some exercises from the chair   Kids are close by to help  HTN bp is stable today  No cp or palpitations or headaches or edema  No side effects to medicines  BP Readings from Last 3 Encounters:  06/12/20 136/82  05/20/20 (!) 167/70  05/13/20 140/60     Pulse Readings from Last 3 Encounters:  06/12/20 72  05/20/20 68  05/13/20 80    Poor hearing -put off her ear doctor appt  Is thinking about hearing aides   Amlodipine  Lasix (for swelling also)  hydralazine  Renal insufficiency Lab Results  Component Value Date   CREATININE 1.04 06/05/2020   BUN 29 (H) 06/05/2020     NA 143 06/05/2020   K 4.7 06/05/2020   CL 101 06/05/2020   CO2 33 (H) 06/05/2020   Lab Results  Component Value Date   ALT 8 06/05/2020   AST 16 06/05/2020   ALKPHOS 59 06/05/2020   BILITOT 0.7 06/05/2020    Mood (depression /anxiety)  Lost husband recently  Taking sertraline   Prediabetes Lab Results  Component Value Date   HGBA1C 5.8 06/05/2020  up from 5.3   Hyperlipidemia Lab Results  Component Value Date   CHOL 199 06/05/2020   CHOL 211 (H) 05/25/2019   CHOL 201 (H) 05/16/2018   Lab Results  Component Value Date   HDL 68.60 06/05/2020   HDL 68.80 05/25/2019   HDL 67.10 05/16/2018   Lab Results  Component Value Date   LDLCALC 108 (H) 06/05/2020   LDLCALC 117 (H) 05/25/2019   LDLCALC 109 (H) 05/16/2018   Lab Results  Component Value Date   TRIG 114.0 06/05/2020   TRIG 124.0 05/25/2019   TRIG 124.0 05/16/2018   Lab Results  Component Value Date   CHOLHDL 3 06/05/2020   CHOLHDL 3 05/25/2019   CHOLHDL 3 05/16/2018   Lab Results  Component Value Date   LDLDIRECT 135.4 04/11/2013   LDLDIRECT 134.0 02/03/2012  LDLDIRECT 150.8 10/30/2010   Pravastatin and diet  Eats a fair diet (no longer fries food)   Lab Results  Component Value Date   WBC 11.8 (H) 06/05/2020   HGB 14.2 06/05/2020   HCT 43.0 06/05/2020   MCV 88.4 06/05/2020   PLT 263.0 06/05/2020  last time wbc high-after prednisone  Has not had a shot recently    Lab Results  Component Value Date   TSH 3.12 06/05/2020    Patient Active Problem List   Diagnosis Date Noted  . Generalized weakness 05/01/2020  . Grief reaction 11/17/2019  . Cervical stenosis of spinal canal 11/17/2019  . Hearing loss 11/17/2019  . Poor balance 06/12/2019  . Screening mammogram, encounter for 05/23/2018  . Substernal chest pain 08/16/2017  . Atherosclerosis of aorta ([place]) 03/09/2017  . Weight loss 02/23/2017  . Routine general medical examination at a health care facility 07/05/2015  . Estrogen  deficiency 07/05/2015  . Prediabetes 01/01/2015  . Lumbar spinal stenosis 11/13/2014  . Vertigo 10/05/2014  . Risk for falls 06/27/2014  . Low back pain on right side with sciatica 07/24/2013  . Edema 04/26/2013  . Encounter for Medicare annual wellness exam 04/11/2013  . GERD (gastroesophageal reflux disease)   . Osteopenia 01/30/2009  . CONSTIPATION 11/01/2007  . Depression with anxiety 02/14/2007  . Hyperlipidemia LDL goal <130 02/04/2007  . Gout 02/04/2007  . Essential hypertension 02/04/2007  . DIVERTICULOSIS, COLON 02/04/2007  . Renal insufficiency 02/04/2007  . OSTEOARTHRITIS 02/04/2007  . [place] DISEASE 02/04/2007   Past Medical History:  Diagnosis Date  . Ankylosis of lumbar spine   . Basal cell carcinoma    removed  . Cervical spine pain   . Chest pain    a. 02/2017 MV: EF 54%, small, distal anterior wall/apical infacrt (likely misregistration), no ischemia-->Low risk; b. 02/2017 Echo: EF 55-60%, mild MR, PASP 87mmHg.  Marland Kitchen Chronic back pain    stenosis  . Constipation    takes Colace daily  . Depression    takes Zoloft daily  . Diverticulosis of colon   . GERD (gastroesophageal reflux disease)    atypical chest pain  . Gout    hx of;was on Allopurinol but taken off 6 wks ago by medical MD  . History of diverticulitis of colon   . HLD (hyperlipidemia)    takes Pravastatin daily  . HTN (hypertension)    takes Amlodipine and carvedilol  . Lumbar spondylosis with myelopathy   . Numbness   . OA (osteoarthritis)    right knee  . Osteopenia    takes Calcium and Vit D daily  . Scoliosis   . Vertigo    takes Antivert daily as needed   Past Surgical History:  Procedure Laterality Date  . APPENDECTOMY  at age 12  . BACK SURGERY    . CATARACT EXTRACTION Right 04/2006  . CHOLECYSTECTOMY  1970  . COLONOSCOPY    . KNEE SURGERY Right    Medial meniscus tear  . LUMBAR LAMINECTOMY/DECOMPRESSION MICRODISCECTOMY Bilateral 11/13/2014   Procedure: Lumbar  one-two Bilateral laminectomy and foraminotomy, Left Lumbar one-two microdiscectomy ;  Surgeon: Faythe Ghee, MD;  Location: [facility];  Service: Neurosurgery;  Laterality: Bilateral;  . NECK SURGERY  1990-2021   x 3; fusions  . PARTIAL COLECTOMY  09/1999   d/t diverticulitis  . TUBAL LIGATION     Social History   Tobacco Use  . Smoking status: Never Smoker  . Smokeless tobacco: Never Used  Vaping  Use  . Vaping Use: Never used  Substance Use Topics  . Alcohol use: Never    Alcohol/week: 0.0 standard drinks  . Drug use: Never   Family History  Problem Relation Age of Onset  . Cancer Father        throat and bladder  . Diabetes Father   . Hypertension Father   . Heart attack Father   . Stroke Mother   . Alzheimer's disease Mother   . Hypertension Mother    Allergies  Allergen Reactions  . Ace Inhibitors     REACTION: increased K+, increased creat.  . Bactrim [Sulfamethoxazole-Trimethoprim]     rash  . Cephalosporins     REACTION: reaction not known  . Colesevelam     REACTION: constipation  . Hctz [Hydrochlorothiazide]     Makes her feel "bad"  . Losartan     Increased K  . Lovastatin     REACTION: doesn't work  . Raloxifene     REACTION: cramps  . Rosuvastatin     REACTION: myalgia  . Simvastatin     REACTION: myalgia   Current Outpatient Medications on File Prior to Visit  Medication Sig Dispense Refill  . aspirin EC 81 MG tablet Take 1 tablet (81 mg total) by mouth daily. 90 tablet 3  . Calcium Carbonate-Vitamin D (CALCIUM 500 + D) 500-125 MG-UNIT TABS Take by mouth.    . Cranberry 250 MG CAPS Take 1 capsule by mouth daily.      Marland Kitchen docusate sodium (COLACE) 100 MG capsule Take 1-2 capsules (100-200 mg total) by mouth 2 (two) times daily. 100 mg in the morning and 200 mg at night 180 capsule 1  . fish oil-omega-3 fatty acids 1000 MG capsule Take 1 g by mouth at bedtime.     . gabapentin (NEURONTIN) 100 MG capsule Take 100 mg by mouth 2 (two) times daily  as needed.    . meclizine (ANTIVERT) 25 MG tablet Take 25 mg by mouth 4 (four) times daily as needed for dizziness.     . Tetrahydrozoline HCl (EYE DROPS OP) Apply 1-2 drops to eye as needed (for dry eye).     No current facility-administered medications on file prior to visit.    Review of Systems  Constitutional: Negative for activity change, appetite change, fatigue, fever and unexpected weight change.  HENT: Negative for congestion, ear pain, rhinorrhea, sinus pressure and sore throat.   Eyes: Negative for pain, redness and visual disturbance.  Respiratory: Negative for cough, shortness of breath and wheezing.   Cardiovascular: Negative for chest pain and palpitations.  Gastrointestinal: Negative for abdominal pain, blood in stool, constipation and diarrhea.  Endocrine: Negative for polydipsia and polyuria.  Genitourinary: Negative for dysuria, frequency and urgency.  Musculoskeletal: Negative for arthralgias, back pain and myalgias.       Chronic R hand pain and weakness from cervical radiculopathy  Frustrated- R handed  Skin: Negative for pallor and rash.  Allergic/Immunologic: Negative for environmental allergies.  Neurological: Negative for dizziness, syncope and headaches.  Hematological: Negative for adenopathy. Does not bruise/bleed easily.  Psychiatric/Behavioral: Negative for decreased concentration and dysphoric mood. The patient is not nervous/anxious.        Objective:   Physical Exam Constitutional:      General: She is not in acute distress.    Appearance: Normal appearance. She is well-developed and normal weight. She is not ill-appearing or diaphoretic.  HENT:     Head: Normocephalic and atraumatic.  Right Ear: Tympanic membrane, ear canal and external ear normal.     Left Ear: Tympanic membrane, ear canal and external ear normal.     Nose: Nose normal. No congestion.     Mouth/Throat:     Mouth: Mucous membranes are moist.     Pharynx: Oropharynx is  clear. No posterior oropharyngeal erythema.  Eyes:     General: No scleral icterus.    Extraocular Movements: Extraocular movements intact.     Conjunctiva/sclera: Conjunctivae normal.     Pupils: Pupils are equal, round, and reactive to light.  Neck:     Thyroid: No thyromegaly.     Vascular: No carotid bruit or JVD.  Cardiovascular:     Rate and Rhythm: Normal rate and regular rhythm.     Pulses: Normal pulses.     Heart sounds: Normal heart sounds. No gallop.   Pulmonary:     Effort: Pulmonary effort is normal. No respiratory distress.     Breath sounds: Normal breath sounds. No wheezing.     Comments: Good air exch Chest:     Chest wall: No tenderness.  Abdominal:     General: Bowel sounds are normal. There is no distension or abdominal bruit.     Palpations: Abdomen is soft. There is no mass.     Tenderness: There is no abdominal tenderness.     Hernia: No hernia is present.  Genitourinary:    Comments: Declines breast cancer screening Musculoskeletal:        General: No tenderness. Normal range of motion.     Cervical back: Normal range of motion and neck supple. No rigidity. No muscular tenderness.     Right lower leg: No edema.     Left lower leg: No edema.  Lymphadenopathy:     Cervical: No cervical adenopathy.  Skin:    General: Skin is warm and dry.     Coloration: Skin is not pale.     Findings: No erythema or rash.     Comments: Solar lentigines diffusely Some sks  Neurological:     Mental Status: She is alert. Mental status is at baseline.     Cranial Nerves: No cranial nerve deficit.     Motor: No abnormal muscle tone.     Coordination: Coordination normal.     Gait: Gait normal.     Deep Tendon Reflexes: Reflexes are normal and symmetric. Reflexes normal.  Psychiatric:        Mood and Affect: Mood normal.        Cognition and Memory: Cognition and memory normal.           Assessment & Plan:   Problem List Items Addressed This Visit       Cardiovascular and Mediastinum   Essential hypertension (Chronic)    bp in fair control at this time  BP Readings from Last 1 Encounters:  06/12/20 136/82   No changes needed Amlodipine, hydralazine and lasix Most recent labs reviewed  Disc lifstyle change with low sodium diet and exercise        Relevant Medications   amLODipine (NORVASC) 10 MG tablet   furosemide (LASIX) 20 MG tablet   hydrALAZINE (APRESOLINE) 10 MG tablet   pravastatin (PRAVACHOL) 20 MG tablet     Digestive   GERD (gastroesophageal reflux disease)    Requiring H2 and PPI to control  Worse with stress  Enc to work on diet      Relevant Medications   famotidine (PEPCID) 20 MG  tablet   omeprazole (PRILOSEC) 20 MG capsule     Nervous and Auditory   Hearing loss    Pt plans to get hearing aides  She will f/u with ENT/audiology This would improve safety as well        Musculoskeletal and Integument   Osteopenia    Pt declines another dexa and not interested right now in another fosamax course Last dexa in 2016 showed improvement  No falls/fx  Exercise is limited-going her PT         Genitourinary   Renal insufficiency    Stable cr Enc good fluid intake and avoidance of nsaids Still on ppi         Other   Hyperlipidemia LDL goal <130 (Chronic)    Disc goals for lipids and reasons to control them Rev last labs with pt Rev low sat fat diet in detail Stable control (some imp in LDL) with pravastatin and diet      Relevant Medications   amLODipine (NORVASC) 10 MG tablet   furosemide (LASIX) 20 MG tablet   hydrALAZINE (APRESOLINE) 10 MG tablet   pravastatin (PRAVACHOL) 20 MG tablet   Depression with anxiety    Lost husband and grief has been difficult Good family and church support Declines formal counseling right now but knows it is available      Relevant Medications   sertraline (ZOLOFT) 100 MG tablet   Prediabetes    Lab Results  Component Value Date   HGBA1C 5.8 06/05/2020     disc imp of low glycemic diet and wt loss to prevent DM2       Routine general medical examination at a health care facility - Primary    Reviewed health habits including diet and exercise and skin cancer prevention Reviewed appropriate screening tests for age  Also reviewed health mt list, fam hx and immunization status , as well as social and family history   See HPI Labs rev amw rev Pt declines breast cancer screening due to age Also declines another dexa (no falls or fx)  Urged to continue ca and D and exercise as tolerated covid immunized Plans to get flu shot at [facility] with family  Not interested in shingrix         Generalized weakness    Continues her PT exercises

## 2020-06-12 NOTE — Assessment & Plan Note (Signed)
Pt plans to get hearing aides  She will f/u with ENT/audiology This would improve safety as well

## 2020-06-12 NOTE — Assessment & Plan Note (Signed)
bp in fair control at this time  BP Readings from Last 1 Encounters:  06/12/20 136/82   No changes needed Amlodipine, hydralazine and lasix Most recent labs reviewed  Disc lifstyle change with low sodium diet and exercise

## 2020-06-12 NOTE — Assessment & Plan Note (Signed)
Pt declines another dexa and not interested right now in another fosamax course Last dexa in 2016 showed improvement  No falls/fx  Exercise is limited-going her PT

## 2020-06-12 NOTE — Patient Instructions (Addendum)
Go ahead and get your flu shot at [facility]  When you are ready to discuss hearing aides- go back to the ear doctor   Take care of yourself  Be careful   Keep close to family and friends  If you ever want to see a grief counselor let us know

## 2020-06-12 NOTE — Assessment & Plan Note (Signed)
Reviewed health habits including diet and exercise and skin cancer prevention Reviewed appropriate screening tests for age  Also reviewed health mt list, fam hx and immunization status , as well as social and family history   See HPI Labs rev amw rev Pt declines breast cancer screening due to age Also declines another dexa (no falls or fx)  Urged to continue ca and D and exercise as tolerated covid immunized Plans to get flu shot at [facility] with family  Not interested in shingrix

## 2020-06-12 NOTE — Assessment & Plan Note (Signed)
Disc goals for lipids and reasons to control them Rev last labs with pt Rev low sat fat diet in detail Stable control (some imp in LDL) with pravastatin and diet

## 2020-06-12 NOTE — Assessment & Plan Note (Signed)
Requiring H2 and PPI to control  Worse with stress  Enc to work on diet

## 2020-06-12 NOTE — Assessment & Plan Note (Signed)
Continues her PT exercises

## 2020-06-12 NOTE — Assessment & Plan Note (Signed)
Lost husband and grief has been difficult Good family and church support Declines formal counseling right now but knows it is available

## 2020-06-12 NOTE — Assessment & Plan Note (Signed)
Stable cr Enc good fluid intake and avoidance of nsaids Still on ppi

## 2020-06-12 NOTE — Assessment & Plan Note (Signed)
Lab Results  Component Value Date   HGBA1C 5.8 06/05/2020   disc imp of low glycemic diet and wt loss to prevent DM2

## 2020-06-17 ENCOUNTER — Ambulatory Visit
Admission: RE | Admit: 2020-06-17 | Discharge: 2020-06-17 | Disposition: A | Discharge status: Home / Self Care | Payer: Medicare HMO | Source: Ambulatory Visit | Attending: Orthopaedic Surgery | Admitting: Orthopaedic Surgery

## 2020-06-17 ENCOUNTER — Other Ambulatory Visit: Payer: Self-pay

## 2020-06-17 DIAGNOSIS — M5011 Cervical disc disorder with radiculopathy,  high cervical region: Secondary | ICD-10-CM | POA: Diagnosis not present

## 2020-06-17 DIAGNOSIS — Q765 Cervical rib: Secondary | ICD-10-CM | POA: Diagnosis not present

## 2020-06-17 DIAGNOSIS — M5013 Cervical disc disorder with radiculopathy, cervicothoracic region: Secondary | ICD-10-CM

## 2020-06-17 DIAGNOSIS — M4802 Spinal stenosis, cervical region: Secondary | ICD-10-CM | POA: Diagnosis not present

## 2020-06-17 DIAGNOSIS — M4322 Fusion of spine, cervical region: Secondary | ICD-10-CM | POA: Diagnosis not present

## 2020-06-17 MED ORDER — DIAZEPAM 5 MG PO TABS
5.0000 mg | ORAL_TABLET | Freq: Once | ORAL | Status: AC
  Administered 2020-06-17: 5 mg via ORAL

## 2020-06-17 NOTE — Discharge Instructions (Signed)

## 2020-06-17 NOTE — Progress Notes (Signed)
Pt reports she has been off of her Sertraline for at least 48 hours and verbalized understanding that she can not resume the medication until tomorrow at 0930 AM.

## 2020-07-03 DIAGNOSIS — R69 Illness, unspecified: Secondary | ICD-10-CM | POA: Diagnosis not present

## 2020-07-16 NOTE — Progress Notes (Signed)
Linda Robinson T. Jahsiah Carpenter, MD, [place]  [facility] [place] [facility] [place], [address]  Phone: [phone]  FAX: [place] - 86 y.o. female  MRN [id number]  Date of Birth: 27-Jan-1929  Date: 07/17/2020  PCP: Abner Greenspan, MD  Referral: Abner Greenspan, MD  Chief Complaint  Patient presents with  . Hand Pain    left about a year    This visit occurred during the SARS-CoV-2 public health emergency.  Safety protocols were in place, including screening questions prior to the visit, additional usage of staff PPE, and extensive cleaning of exam room while observing appropriate contact time as indicated for disinfecting solutions.   Subjective:   KAIDAN SPENGLER is a 86 y.o. very pleasant female patient with Body mass index is 25.67 kg/m. who presents with the following:  She had a very complicated cervical spine surgery done by Dr. Patrice Paradise 11/2019, and she has had other cervical spine surgeries starting in the 1990's.  She had a cervical fusion from C4-C7 with instrumentation at c6-7 and a c7-t1 revision. She had some hand weakness when I fist saw her.  She can to see me 02/2020 and 04/2020, and I recommended follow-up with her spine surgeon.  At that time, she was having muscular loss of the hand and radicular neck pain as well as left handed weakness.  She was to have EMG/NCV set up from her spine surgeons office.  She has also seen Dr. Leta Baptist 05/20/2020 who did not think EMG/NCV would be all that helpful.  Dr. Patrice Paradise did a CT myelogram less than 1 month ago. The result is included below.  On chart review from 06/12/2020, the patient has not seen Dr. Patrice Paradise per report. The last records in her chart are from 11/15/2019.  Saw the PA again at follow-up, but she reports that she has not seen Dr. Patrice Paradise.  She is very distraught and upset about her ongoing continuous and severe hand pain with  decreased functionality in her grip as well as in her wrist function and she has left hand dominant.  She did have a covid test.  She has done some OT for several months.    DR. Glori Bickers FOR ? PAIN MANAGEMENT ?  CLINICAL DATA:  Left hand pain, cervical radiculopathy, previous fusion surgery   EXAM: CERVICAL MYELOGRAM   CT CERVICAL SPINE WITH INTRATHECAL CONTRAST   TECHNIQUE: The procedure, risks (including but not limited to bleeding, infection, organ damage ), benefits, and alternatives were explained to the patient. Questions regarding the procedure were encouraged and answered. The patient understands and consents to the procedure. An appropriate entry site was determined under fluoroscopy. Operator donned sterile gloves and mask. Skin site was marked,prepped with Betadine, and draped in usual sterile fashion, and infiltrated locally with 1% lidocaine. A 22 gauge spinal needle was advanced into the thecal sac at L3 from a right parasagittal approach. Clear colorless CSF returned. 10 ml Omnipaque 300 were administered intrathecally for cervical myelography, followed by axial CT scanning of the cervical spine. I personally performed the lumbar puncture and administered the intrathecal contrast. I also personally supervised acquisition of the myelogram images. Coronal and sagittal reconstructions were generated.   FINDINGS: On myelography, there was initially apparent myelographic blockage at the thoracolumbar junction with little contrast passing into the thoracic and cervical region on fluoroscopy. On initial CT, there was poor opacification of the CSF at  in the cervical region. However, limited CT through the thoracolumbar junction demonstrated good intrathecal opacification without evidence of mixed injection. With further patient manipulation, improved opacification of the cervical region was achieved.   Normal alignment.  Negative for fracture or focal bone lesion.    C2-3: Mild narrowing of the interspace. Mild circumferential disc bulge. Right uncovertebral spurs minimally encroach upon the neural foramen. Central canal patent.   C3-4: Marked narrowing of the interspace with broad posterior disc bulge and associated endplate spurs. Mild spinal stenosis without cord distortion. Mild encroachment upon bilateral neural foramina by uncovertebral and facet spurs.   C4-5: Solid-appearing interbody fusion. Central canal and foramina patent.   C5-6: Solid-appearing interbody fusion. Central canal and foramina patent.   C6-7: Solid-appearing interbody fusion. Right paracentral residual disc contacts and slightly distorts the anterior aspect of the cord. Foramina patent.   C7-T1: Bilateral cervical ribs at C7. Changes of instrumented ACDF with solid-appearing fusion across the interspace. Mild residual disc bulge which approaches but does not distort the cervical cord. Left foraminal encroachment secondary to uncovertebral and facet spurs.   T1-T2: Broad midline bulge minimally distorts the anterior aspect of the cervical cord. No spinal stenosis. Foramina patent.   Bilateral carotid bifurcation calcified plaque. Regional soft tissues otherwise unremarkable.   IMPRESSION: 1. Solid-appearing interbody fusion C4-T1. 2. Adjacent level disease C3-4 with mild spinal stenosis and foraminal encroachment. 3. Broad midline bulge T1-T2 minimally distorts the anterior aspect of the cervical cord, without spinal stenosis. 4. Bilateral cervical ribs at C7. 5. Bilateral carotid bifurcation calcified plaque.     Electronically Signed   By: Lucrezia Europe M.D.   On: 06/17/2020 11:29   05/13/2020 Last OV with Owens Loffler, MD  She is a LHD very nice lady who I recall quite well and I have seen a few times over the years.  Last time I saw her was approximately 9 months ago with some cervical radiculopathy.  This was fairly severe and she had radicular  symptoms all the way down to her left hand.   She does have a history of having a prior decompression and fusion, cervical, by Dr. Patrice Paradise in [place].   She is having some significant left-sided hand weakness.  She is not a very good historian, and I believe she was telling me that she had some weakness in her hand prior to surgery.  She does definitely have some muscular atrophy at the hand as well as the forearm on the left.   She has a follow-up appointment with her spine surgeon, Dr. Patrice Paradise, in about 6 weeks.  She also has follow-up for EMG nerve conduction velocities with [facility], which Dr. Patrice Paradise set up.   She currently not doing any OT.  She does have significant difficulty driving, and her husband just died.  She does not think she would be able to drive to go to occupational therapy.  She is only done some basic home rehab now.   She is a little bit confused about the exact surgical procedure that she had, she seems to not understand that she did have a bone graft.   8/16 appt with Dr. Patrice Paradise 8/23 - appt with EMG / NCV   12/12/2019 - fusion, ? About this.  She is somewhat confused about exactly what procedure she had.  It does appear that she had a revision of C7-T1 ACDF.   Hospital record:   Pertinent Hospital Course: The patient on 12/11/19 s/p elective C7-T1 ACDF (see below) by  Dr. Patrice Paradise.   Per M.D. Operative Note: Procedure:  1. Anterior cervical arthrodesis C7-T1 with placement of Titan titanium cage 2. Anterior cervical discectomy C7-T1 with wide decompression of the thecal sac and nerve roots bilaterally 3. Anterior cervical instrumentation utilizing the Medtronic Zevo plating system C7-T1 4. Harvest of anterior iliac crest bone graft from the left hip through a separate skin and fascial incision 5. Local autogenous bone graft supplemented with Master graft bone graft substitute 6. Removal of anterior cervical instrumentation from C6-7     Review of Systems is noted in the  HPI, as appropriate   Objective:   BP 130/70   Pulse 88   Temp 97.7 F (36.5 C) (Skin)   Ht 5' 0.75" (1.543 m)   Wt 134 lb 12 oz (61.1 kg)   SpO2 98%   BMI 25.67 kg/m   She has a limitation in motion at the neck and has lost approximately 60% of range of motion in all directions.  She has nontender at the spinous processes but she is diffusely tender throughout almost all musculature around the neck as well as down into the trap.  Strength and range of motion at the shoulder are intact with 4+ to 5 strength in all directions.  She also has 5/5 strength in flexion and extension at the elbow.  Her grip strength is quite poor on the left compared to the right and she has severe wasting of all musculature in the hand.  She does appear to have a wrist effusion as well.  Intrinsic muscles of the hand and fingers are also wasted.  She also has decreased extension and flexion at the wrist to 4 -/5  Her pulses are 2+  Radiology: CT CERVICAL SPINE W CONTRAST  Result Date: 06/17/2020 CLINICAL DATA:  Left hand pain, cervical radiculopathy, previous fusion surgery EXAM: CERVICAL MYELOGRAM CT CERVICAL SPINE WITH INTRATHECAL CONTRAST TECHNIQUE: The procedure, risks (including but not limited to bleeding, infection, organ damage ), benefits, and alternatives were explained to the patient. Questions regarding the procedure were encouraged and answered. The patient understands and consents to the procedure. An appropriate entry site was determined under fluoroscopy. Operator donned sterile gloves and mask. Skin site was marked,prepped with Betadine, and draped in usual sterile fashion, and infiltrated locally with 1% lidocaine. A 22 gauge spinal needle was advanced into the thecal sac at L3 from a right parasagittal approach. Clear colorless CSF returned. 10 ml Omnipaque 300 were administered intrathecally for cervical myelography, followed by axial CT scanning of the cervical spine. I personally  performed the lumbar puncture and administered the intrathecal contrast. I also personally supervised acquisition of the myelogram images. Coronal and sagittal reconstructions were generated. FINDINGS: On myelography, there was initially apparent myelographic blockage at the thoracolumbar junction with little contrast passing into the thoracic and cervical region on fluoroscopy. On initial CT, there was poor opacification of the CSF at in the cervical region. However, limited CT through the thoracolumbar junction demonstrated good intrathecal opacification without evidence of mixed injection. With further patient manipulation, improved opacification of the cervical region was achieved. Normal alignment.  Negative for fracture or focal bone lesion. C2-3: Mild narrowing of the interspace. Mild circumferential disc bulge. Right uncovertebral spurs minimally encroach upon the neural foramen. Central canal patent. C3-4: Marked narrowing of the interspace with broad posterior disc bulge and associated endplate spurs. Mild spinal stenosis without cord distortion. Mild encroachment upon bilateral neural foramina by uncovertebral and facet spurs. C4-5: Solid-appearing interbody fusion. Central  canal and foramina patent. C5-6: Solid-appearing interbody fusion. Central canal and foramina patent. C6-7: Solid-appearing interbody fusion. Right paracentral residual disc contacts and slightly distorts the anterior aspect of the cord. Foramina patent. C7-T1: Bilateral cervical ribs at C7. Changes of instrumented ACDF with solid-appearing fusion across the interspace. Mild residual disc bulge which approaches but does not distort the cervical cord. Left foraminal encroachment secondary to uncovertebral and facet spurs. T1-T2: Broad midline bulge minimally distorts the anterior aspect of the cervical cord. No spinal stenosis. Foramina patent. Bilateral carotid bifurcation calcified plaque. Regional soft tissues otherwise unremarkable.  IMPRESSION: 1. Solid-appearing interbody fusion C4-T1. 2. Adjacent level disease C3-4 with mild spinal stenosis and foraminal encroachment. 3. Broad midline bulge T1-T2 minimally distorts the anterior aspect of the cervical cord, without spinal stenosis. 4. Bilateral cervical ribs at C7. 5. Bilateral carotid bifurcation calcified plaque. Electronically Signed   By: Lucrezia Europe M.D.   On: 06/17/2020 11:29   DG MYELOGRAPHY LUMBAR INJ CERVICAL  Result Date: 06/17/2020 CLINICAL DATA:  Left hand pain, cervical radiculopathy, previous fusion surgery EXAM: CERVICAL MYELOGRAM CT CERVICAL SPINE WITH INTRATHECAL CONTRAST TECHNIQUE: The procedure, risks (including but not limited to bleeding, infection, organ damage ), benefits, and alternatives were explained to the patient. Questions regarding the procedure were encouraged and answered. The patient understands and consents to the procedure. An appropriate entry site was determined under fluoroscopy. Operator donned sterile gloves and mask. Skin site was marked,prepped with Betadine, and draped in usual sterile fashion, and infiltrated locally with 1% lidocaine. A 22 gauge spinal needle was advanced into the thecal sac at L3 from a right parasagittal approach. Clear colorless CSF returned. 10 ml Omnipaque 300 were administered intrathecally for cervical myelography, followed by axial CT scanning of the cervical spine. I personally performed the lumbar puncture and administered the intrathecal contrast. I also personally supervised acquisition of the myelogram images. Coronal and sagittal reconstructions were generated. FINDINGS: On myelography, there was initially apparent myelographic blockage at the thoracolumbar junction with little contrast passing into the thoracic and cervical region on fluoroscopy. On initial CT, there was poor opacification of the CSF at in the cervical region. However, limited CT through the thoracolumbar junction demonstrated good intrathecal  opacification without evidence of mixed injection. With further patient manipulation, improved opacification of the cervical region was achieved. Normal alignment.  Negative for fracture or focal bone lesion. C2-3: Mild narrowing of the interspace. Mild circumferential disc bulge. Right uncovertebral spurs minimally encroach upon the neural foramen. Central canal patent. C3-4: Marked narrowing of the interspace with broad posterior disc bulge and associated endplate spurs. Mild spinal stenosis without cord distortion. Mild encroachment upon bilateral neural foramina by uncovertebral and facet spurs. C4-5: Solid-appearing interbody fusion. Central canal and foramina patent. C5-6: Solid-appearing interbody fusion. Central canal and foramina patent. C6-7: Solid-appearing interbody fusion. Right paracentral residual disc contacts and slightly distorts the anterior aspect of the cord. Foramina patent. C7-T1: Bilateral cervical ribs at C7. Changes of instrumented ACDF with solid-appearing fusion across the interspace. Mild residual disc bulge which approaches but does not distort the cervical cord. Left foraminal encroachment secondary to uncovertebral and facet spurs. T1-T2: Broad midline bulge minimally distorts the anterior aspect of the cervical cord. No spinal stenosis. Foramina patent. Bilateral carotid bifurcation calcified plaque. Regional soft tissues otherwise unremarkable. IMPRESSION: 1. Solid-appearing interbody fusion C4-T1. 2. Adjacent level disease C3-4 with mild spinal stenosis and foraminal encroachment. 3. Broad midline bulge T1-T2 minimally distorts the anterior aspect of the cervical cord, without spinal  stenosis. 4. Bilateral cervical ribs at C7. 5. Bilateral carotid bifurcation calcified plaque. Electronically Signed   By: Lucrezia Europe M.D.   On: 06/17/2020 11:29   Assessment and Plan:     ICD-10-CM   1. 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 Atrophy of muscle of left forearm  M62.532   3. Cervical  vertebral fusion  M43.22   4. Left hand weakness  R29.898   5. Poor historian  Z78.9    Total encounter time: 30 minutes. This includes total time spent on the day of encounter.  Extensive chart review as well as extensive face-to-face conversation This is a case with no good solution.  Her prognosis is poor in my opinion.  Over time, she could possibly recover some of her lack of function in the left extremity and hand.  This is the least possible.  She really should discuss this face-to-face with her operating surgeon, and per her report this has not happened.  With this type of failed neck syndrome, typically pain management would be an appropriate step, but the patient is already 86 years old and that is a challenging situation and can lead to other injuries.  I am going to send a copy of my note to her primary care doctor, and we can talk about this for potential pain management options.  This is not really a sports medicine case, so I will only see her on an as needed basis.   Follow-up: No follow-ups on file.  Signed,  Maud Deed. Reegan Bouffard, MD   Outpatient Encounter Medications as of 07/17/2020  Medication Sig  . amLODipine (NORVASC) 10 MG tablet Take 1 tablet (10 mg total) by mouth daily.  Marland Kitchen aspirin EC 81 MG tablet Take 1 tablet (81 mg total) by mouth daily.  . Calcium Carbonate-Vitamin D (CALCIUM 500 + D) 500-125 MG-UNIT TABS Take by mouth.  . Cranberry 250 MG CAPS Take 1 capsule by mouth daily.    Marland Kitchen docusate sodium (COLACE) 100 MG capsule Take 1-2 capsules (100-200 mg total) by mouth 2 (two) times daily. 100 mg in the morning and 200 mg at night  . famotidine (PEPCID) 20 MG tablet Take 1 tablet (20 mg total) by mouth 2 (two) times daily.  . fish oil-omega-3 fatty acids 1000 MG capsule Take 1 g by mouth at bedtime.   . furosemide (LASIX) 20 MG tablet TAKE 1 TABLET DAILY AS NEEDED FOR SWELLING  . gabapentin (NEURONTIN) 100 MG capsule Take 100 mg by mouth 2 (two) times daily as  needed.  . hydrALAZINE (APRESOLINE) 10 MG tablet Take 1 tablet (10 mg total) by mouth daily.  . meclizine (ANTIVERT) 25 MG tablet Take 25 mg by mouth 4 (four) times daily as needed for dizziness.   Marland Kitchen omeprazole (PRILOSEC) 20 MG capsule Take 1 capsule (20 mg total) by mouth daily.  . pravastatin (PRAVACHOL) 20 MG tablet Take 1 tablet (20 mg total) by mouth daily.  . sertraline (ZOLOFT) 100 MG tablet Take 1 tablet (100 mg total) by mouth daily.  . Tetrahydrozoline HCl (EYE DROPS OP) Apply 1-2 drops to eye as needed (for dry eye).   No facility-administered encounter medications on file as of 07/17/2020.

## 2020-07-17 ENCOUNTER — Other Ambulatory Visit: Payer: Self-pay

## 2020-07-17 ENCOUNTER — Ambulatory Visit (INDEPENDENT_AMBULATORY_CARE_PROVIDER_SITE_OTHER): Payer: Medicare HMO | Admitting: Family Medicine

## 2020-07-17 ENCOUNTER — Encounter: Payer: Self-pay | Admitting: Family Medicine

## 2020-07-17 VITALS — BP 130/70 | HR 88 | Temp 97.7°F | Ht 60.75 in | Wt 134.8 lb

## 2020-07-17 DIAGNOSIS — M62532 Muscle wasting and atrophy, not elsewhere classified, left forearm: Secondary | ICD-10-CM | POA: Diagnosis not present

## 2020-07-17 DIAGNOSIS — M4322 Fusion of spine, cervical region: Secondary | ICD-10-CM | POA: Diagnosis not present

## 2020-07-17 DIAGNOSIS — Z789 Other specified health status: Secondary | ICD-10-CM | POA: Diagnosis not present

## 2020-07-17 DIAGNOSIS — R29898 Other symptoms and signs involving the musculoskeletal system: Secondary | ICD-10-CM | POA: Diagnosis not present

## 2020-07-17 DIAGNOSIS — M961 Postlaminectomy syndrome, not elsewhere classified: Secondary | ICD-10-CM | POA: Diagnosis not present

## 2020-07-22 ENCOUNTER — Telehealth: Payer: Self-pay | Admitting: *Deleted

## 2020-07-22 DIAGNOSIS — M4802 Spinal stenosis, cervical region: Secondary | ICD-10-CM

## 2020-07-22 DIAGNOSIS — M961 Postlaminectomy syndrome, not elsewhere classified: Secondary | ICD-10-CM

## 2020-07-22 DIAGNOSIS — M79642 Pain in left hand: Secondary | ICD-10-CM

## 2020-07-22 NOTE — Telephone Encounter (Signed)
Called pt and no VM set up, kept ringing

## 2020-07-22 NOTE — Telephone Encounter (Addendum)
-----   Message from Abner Greenspan, MD sent at 07/21/2020  3:20 PM EDT ----- Halei Hanover-please let pt know that Dr Lorelei Pont spoke to me about her.   We both feel that she needs a f/u visit with her spine surgeon again (let me know if she needs a referral)  Unless they have a preference, a referral to pain management may be helpful, which I could initiate.

## 2020-07-30 DIAGNOSIS — M79642 Pain in left hand: Secondary | ICD-10-CM | POA: Insufficient documentation

## 2020-07-30 DIAGNOSIS — M961 Postlaminectomy syndrome, not elsewhere classified: Secondary | ICD-10-CM | POA: Insufficient documentation

## 2020-07-30 NOTE — Telephone Encounter (Signed)
Patient notified as instructed by telephone and verbalized understanding. Patient stated that she does not want to go back to the spine surgeon because she is very unhappy with him. Patient stated when she had her last surgery the doctor did a fusion and she nor her family were ever told that is what he did. Patient stated that he turned her care over to a PA and she is very upset about that. Patient stated that she is willing to try a referral to pain management and would like for Dr. Glori Bickers to set that up for her. Advised patient that she will hear back from one of the referral coordinators to get that set up for her and she verbalized understanding.

## 2020-07-30 NOTE — Telephone Encounter (Signed)
Referral done  Will route to [facility]  Thanks

## 2020-07-30 NOTE — Addendum Note (Signed)
Addended by: Loura Pardon A on: 07/30/2020 12:38 PM   Modules accepted: Orders

## 2020-08-06 ENCOUNTER — Encounter: Payer: Self-pay | Admitting: Family Medicine

## 2020-08-07 ENCOUNTER — Encounter: Payer: Self-pay | Admitting: Family Medicine

## 2020-08-07 MED ORDER — GABAPENTIN 100 MG PO CAPS
100.0000 mg | ORAL_CAPSULE | Freq: Two times a day (BID) | ORAL | 3 refills | Status: DC | PRN
Start: 2020-08-07 — End: 2020-09-12

## 2020-09-12 ENCOUNTER — Other Ambulatory Visit: Payer: Self-pay | Admitting: Student in an Organized Health Care Education/Training Program

## 2020-09-12 ENCOUNTER — Encounter: Payer: Self-pay | Admitting: Student in an Organized Health Care Education/Training Program

## 2020-09-12 ENCOUNTER — Ambulatory Visit
Payer: Medicare HMO | Attending: Student in an Organized Health Care Education/Training Program | Admitting: Student in an Organized Health Care Education/Training Program

## 2020-09-12 ENCOUNTER — Telehealth: Payer: Self-pay | Admitting: Student in an Organized Health Care Education/Training Program

## 2020-09-12 ENCOUNTER — Other Ambulatory Visit: Payer: Self-pay

## 2020-09-12 VITALS — BP 122/104 | HR 75 | Temp 97.5°F | Resp 18 | Ht 61.0 in | Wt 137.4 lb

## 2020-09-12 DIAGNOSIS — G894 Chronic pain syndrome: Secondary | ICD-10-CM | POA: Diagnosis not present

## 2020-09-12 DIAGNOSIS — M5412 Radiculopathy, cervical region: Secondary | ICD-10-CM | POA: Insufficient documentation

## 2020-09-12 DIAGNOSIS — Q761 Klippel-Feil syndrome: Secondary | ICD-10-CM | POA: Insufficient documentation

## 2020-09-12 DIAGNOSIS — M47812 Spondylosis without myelopathy or radiculopathy, cervical region: Secondary | ICD-10-CM | POA: Insufficient documentation

## 2020-09-12 DIAGNOSIS — M792 Neuralgia and neuritis, unspecified: Secondary | ICD-10-CM | POA: Diagnosis not present

## 2020-09-12 DIAGNOSIS — M5010 Cervical disc disorder with radiculopathy, unspecified cervical region: Secondary | ICD-10-CM | POA: Insufficient documentation

## 2020-09-12 MED ORDER — GABAPENTIN 100 MG PO CAPS: 200.0000 mg | ORAL_CAPSULE | Freq: Two times a day (BID) | ORAL | 1 refills | Status: DC | PRN

## 2020-09-12 NOTE — Telephone Encounter (Signed)
[facility] Mail order Medications  [phone] patient would like to have her meds sent there please, it is 90% cheaper. Please let patient know.

## 2020-09-12 NOTE — Progress Notes (Signed)
Patient: Linda Robinson  Service Category: E/M  Provider: Gillis Santa, MD  DOB: [birth year]  DOS: 09/12/2020  Referring Provider: Abner Greenspan, MD  MRN: [id number]  Setting: Ambulatory outpatient  PCP: Abner Greenspan, MD  Type: New Patient  Specialty: Interventional Pain Management    Location: Office  Delivery: Face-to-face     Primary Reason(s) for Visit: Encounter for initial evaluation of one or more chronic problems (new to examiner) potentially causing chronic pain, and posing a threat to normal musculoskeletal function. (Level of risk: High) CC: Shoulder Pain (left)  HPI  Linda Robinson is a 86 y.o. year old, female patient, who comes for the first time to our practice referred by Abner Greenspan, MD for our initial evaluation of her chronic pain. She has Hyperlipidemia LDL goal <130; Gout; Depression with anxiety; Essential hypertension; DIVERTICULOSIS, COLON; CONSTIPATION; Renal insufficiency; OSTEOARTHRITIS; DEGENERATIVE DISC DISEASE; Osteopenia; GERD (gastroesophageal reflux disease); Encounter for Medicare annual wellness exam; Edema; Low back pain on right side with sciatica; Risk for falls; Vertigo; Lumbar spinal stenosis; Prediabetes; Routine general medical examination at a health care facility; Estrogen deficiency; Weight loss; Atherosclerosis of aorta ([place]); Substernal chest pain; Screening mammogram, encounter for; Poor balance; Grief reaction; Cervical stenosis of spinal canal; Hearing loss; Generalized weakness; Failed neck syndrome; Left hand pain; Cervical radicular pain (left); Cervical disc disorder with radiculopathy of cervical region; Cervical spondylosis; Cervical fusion syndrome (C4-T1 ACDF); Neuropathic pain of hand, left; and Chronic pain syndrome on their problem list. Today she comes in for evaluation of her Shoulder Pain (left)  Pain Assessment: Location: Left Shoulder Radiating: down left arm to include all fingers except thumb; "hand now stays cold" Onset: More than a  month ago Duration: Chronic pain Quality: Sharp Severity: 10-Worst pain ever/10 (subjective, self-reported pain score)  Effect on ADL: "I'm in pain all the time" ; interferes will ability to rest and sleep Timing: Constant Modifying factors: meds sometimes help BP: (!) 122/104   HR: 75  Onset and Duration: Date of onset: 07-18-2019 and Present longer than 3 months Cause of pain: Unknown Severity: Getting worse, NAS-11 at its worse: 10/10, NAS-11 at its best: 5/10, NAS-11 now: 5/10 and NAS-11 on the average: 8/10 Timing: Not influenced by the time of the day, During activity or exercise and After activity or exercise Aggravating Factors: Lifiting, Motion, Surgery made it worse and Twisting Alleviating Factors: Hot packs Associated Problems: Depression, Fatigue, Numbness and Weakness Quality of Pain: Agonizing, Deep, Distressing, Punishing, Sharp and Stabbing Previous Examinations or Tests: CT scan, Ct-Myelogram, MRI scan, Myelogram, X-rays and Orthopedic evaluation Previous Treatments: Physical Therapy, Steroid treatments by mouth and Stretching exercises  Linda Robinson is a pleasant 86 year old female who presents with a chief complaint of neck pain that radiates down her left arm to all of her fingers except her thumb.  Patient has a history of multiple cervical spinal fusion surgeries.  She has had a C4-C7 fusion with a subsequent C6-C7 and C7-T1 revision.  Patient having weakness of her left hand as well as contractures.  CT myelogram has been done which shows a C4-T1 fusion with adjacent level disease at C3-C4 with a small disc herniation at T1-T2.  Apparently the patient is frustrated and angry with her surgeon as she was not aware or informed that she would be having another fusion surgery.  She has been referred to neurology to consider EMG however the neurologist felt that this would not be of any clinical benefit.  She is frustrated with  her lack of pain control.  She is currently on  gabapentin 100 mg once to twice a day.  She also takes ibuprofen 400 mg twice daily.  Of note she has had prior cervical epidural steroid injections which were not helpful.  Meds   Current Outpatient Medications:    amLODipine (NORVASC) 10 MG tablet, Take 1 tablet (10 mg total) by mouth daily., Disp: 90 tablet, Rfl: 3   aspirin EC 81 MG tablet, Take 1 tablet (81 mg total) by mouth daily., Disp: 90 tablet, Rfl: 3   Calcium Carbonate-Vitamin D 500-125 MG-UNIT TABS, Take by mouth., Disp: , Rfl:    Cranberry 250 MG CAPS, Take 1 capsule by mouth daily., Disp: , Rfl:    docusate sodium (COLACE) 100 MG capsule, Take 1-2 capsules (100-200 mg total) by mouth 2 (two) times daily. 100 mg in the morning and 200 mg at night, Disp: 180 capsule, Rfl: 1   famotidine (PEPCID) 20 MG tablet, Take 1 tablet (20 mg total) by mouth 2 (two) times daily., Disp: 180 tablet, Rfl: 3   fish oil-omega-3 fatty acids 1000 MG capsule, Take 1 g by mouth at bedtime., Disp: , Rfl:    furosemide (LASIX) 20 MG tablet, TAKE 1 TABLET DAILY AS NEEDED FOR SWELLING, Disp: 90 tablet, Rfl: 1   hydrALAZINE (APRESOLINE) 10 MG tablet, Take 1 tablet (10 mg total) by mouth daily., Disp: 90 tablet, Rfl: 3   ibuprofen (ADVIL) 800 MG tablet, Take 800 mg by mouth daily as needed., Disp: , Rfl:    meclizine (ANTIVERT) 25 MG tablet, Take 25 mg by mouth 4 (four) times daily as needed for dizziness., Disp: , Rfl:    omeprazole (PRILOSEC) 20 MG capsule, Take 1 capsule (20 mg total) by mouth daily., Disp: 90 capsule, Rfl: 3   pravastatin (PRAVACHOL) 20 MG tablet, Take 1 tablet (20 mg total) by mouth daily., Disp: 90 tablet, Rfl: 3   sertraline (ZOLOFT) 100 MG tablet, Take 1 tablet (100 mg total) by mouth daily., Disp: 90 tablet, Rfl: 3   Tetrahydrozoline HCl (EYE DROPS OP), Apply 1-2 drops to eye as needed (for dry eye)., Disp: , Rfl:    gabapentin (NEURONTIN) 100 MG capsule, Take 2 capsules (200 mg total) by mouth 2 (two) times daily as  needed., Disp: 120 capsule, Rfl: 1  Imaging Review  Cervical Imaging: Cervical MR wo contrast: Results for orders placed during the hospital encounter of 08/12/19  MR CERVICAL SPINE WO CONTRAST  Narrative CLINICAL DATA:  Cervical spine fusion. Failed neck syndrome. Neck pain with radiculopathy.  Creatinine was obtained on site at [facility] at [address]  Results: Creatinine 1.5 mg/dL.  GFR 31  EXAM: MRI CERVICAL SPINE WITHOUT CONTRAST  TECHNIQUE: Multiplanar, multisequence MR imaging of the cervical spine was performed. No intravenous contrast was administered.  COMPARISON:  Cervical radiographs 07/12/2019. Cervical MRI 10/31/2003  FINDINGS: Alignment: 3 mm retrolisthesis C3-4.  Remaining alignment normal.  Vertebrae: Solid bony fusion C4-5 and C5-6. ACDF with anterior plate at R4-2. Negative for fracture or mass  Cord: Normal spinal cord signal and morphology.  Posterior Fossa, vertebral arteries, paraspinal tissues: Negative for paraspinous mass or edema.  Disc levels:  C2-3: Disc degeneration and spurring with mild foraminal narrowing bilaterally.  C3-4: Disc degeneration and diffuse endplate spurring. 3 mm retrolisthesis. Bilateral facet degeneration. Mild spinal stenosis and moderate foraminal stenosis bilaterally due to spurring  C4-5: Solid interbody fusion. Negative for spinal or foraminal stenosis  C5-6: Solid interbody fusion.  Negative for spinal or foraminal stenosis.  C6-7: ACDF with anterior plate. Diffuse endplate spurring. Cord flattening with mild spinal stenosis and mild foraminal stenosis bilaterally.  C7-T1: Disc degeneration and spondylosis. Prominent left-sided osteophyte with moderate to severe left foraminal encroachment. Mild right foraminal stenosis.  IMPRESSION: Disc degeneration and spondylosis at C3-4 causing mild spinal stenosis and moderate foraminal stenosis bilaterally  Solid bony fusion C4-5 and C5-6  without stenosis  ACDF C6-7. Diffuse endplate spurring causing mild spinal stenosis and moderate foraminal stenosis bilaterally.  Moderate to severe left foraminal encroachment C7-T1 due to spurring.   Electronically Signed By: Franchot Gallo M.D. On: 08/13/2019 10:31  Cervical MR wo contrast: No valid procedures specified. Cervical MR w/wo contrast: Results for orders placed during the hospital encounter of 11/11/19  MR CERVICAL SPINE W WO CONTRAST  Narrative CLINICAL DATA:  Cervical radiculitis. Left arm pain and weakness. Remote cervical fusion.  Creatinine was obtained on site at [facility] at [address]  Results: Creatinine 1.2 mg/dL.  EXAM: MRI CERVICAL SPINE WITHOUT AND WITH CONTRAST  TECHNIQUE: Multiplanar and multiecho pulse sequences of the cervical spine, to include the craniocervical junction and cervicothoracic junction, were obtained without and with intravenous contrast.  CONTRAST:  29m MULTIHANCE GADOBENATE DIMEGLUMINE 529 MG/ML IV SOLN  COMPARISON:  08/12/2019  FINDINGS: Alignment: Unchanged cervical spine straightening and minimal retrolisthesis of C3 on C4.  Vertebrae: No fracture or suspicious marrow lesion. C4-C7 ACDF with anterior fusion plate and screws in place at C6-7. Solid interbody arthrodesis at C4-5 and C5-6. Solid interbody arthrodesis at C6-7 as well based on previous radiographs.  Cord: Normal signal.  Posterior Fossa, vertebral arteries, paraspinal tissues: Unremarkable.  Disc levels:  C2-3: Moderate disc space narrowing. Disc bulging, uncovertebral spurring, and mild facet arthrosis result in mild right neural foraminal stenosis without spinal stenosis, unchanged.  C3-4: Severe disc space narrowing. Broad-based posterior disc osteophyte complex and mild-to-moderate facet arthrosis result in mild spinal stenosis and moderate bilateral neural foraminal stenosis, unchanged.  C4-5: Anterior fusion. No  stenosis.  C5-6: Anterior fusion. No stenosis.  C6-7: Anterior fusion. Osteophytic ridging greater to the right results in borderline to mild spinal stenosis and mild right neural foraminal stenosis, unchanged.  C7-T1: Moderate disc space narrowing. Disc bulging, asymmetric left uncovertebral spurring, and moderate facet arthrosis result in mild right and severe left neural foraminal stenosis with potential left C8 nerve root impingement, unchanged. No significant spinal stenosis.  T1-2: Moderate disc space narrowing. Disc bulging and spurring result in moderate bilateral neural foraminal stenosis and borderline spinal stenosis, unchanged.  T2-3: Only imaged sagittally. Disc bulging eccentric to the right and severe right and mild left facet arthrosis result in borderline spinal stenosis and severe right neural foraminal stenosis, unchanged.  IMPRESSION: 1. Unchanged advanced cervical disc and facet degeneration. 2. Mild spinal stenosis and moderate bilateral neural foraminal stenosis at C3-4. 3. Severe left neural foraminal stenosis at C7-T1. 4. Moderate to severe neural foraminal stenosis in the upper thoracic spine. 5. C4-C7 anterior fusion without residual compressive stenosis.   Electronically Signed By: ALogan BoresM.D. On: 11/12/2019 06:34    Narrative CLINICAL DATA:  Left hand pain, cervical radiculopathy, previous fusion surgery  EXAM: CERVICAL MYELOGRAM  CT CERVICAL SPINE WITH INTRATHECAL CONTRAST  TECHNIQUE: The procedure, risks (including but not limited to bleeding, infection, organ damage ), benefits, and alternatives were explained to the patient. Questions regarding the procedure were encouraged and answered. The patient understands and consents to the procedure. An  appropriate entry site was determined under fluoroscopy. Operator donned sterile gloves and mask. Skin site was marked,prepped with Betadine, and draped in usual sterile fashion, and  infiltrated locally with 1% lidocaine. A 22 gauge spinal needle was advanced into the thecal sac at L3 from a right parasagittal approach. Clear colorless CSF returned. 10 ml Omnipaque 300 were administered intrathecally for cervical myelography, followed by axial CT scanning of the cervical spine. I personally performed the lumbar puncture and administered the intrathecal contrast. I also personally supervised acquisition of the myelogram images. Coronal and sagittal reconstructions were generated.  FINDINGS: On myelography, there was initially apparent myelographic blockage at the thoracolumbar junction with little contrast passing into the thoracic and cervical region on fluoroscopy. On initial CT, there was poor opacification of the CSF at in the cervical region. However, limited CT through the thoracolumbar junction demonstrated good intrathecal opacification without evidence of mixed injection. With further patient manipulation, improved opacification of the cervical region was achieved.  Normal alignment.  Negative for fracture or focal bone lesion.  C2-3: Mild narrowing of the interspace. Mild circumferential disc bulge. Right uncovertebral spurs minimally encroach upon the neural foramen. Central canal patent.  C3-4: Marked narrowing of the interspace with broad posterior disc bulge and associated endplate spurs. Mild spinal stenosis without cord distortion. Mild encroachment upon bilateral neural foramina by uncovertebral and facet spurs.  C4-5: Solid-appearing interbody fusion. Central canal and foramina patent.  C5-6: Solid-appearing interbody fusion. Central canal and foramina patent.  C6-7: Solid-appearing interbody fusion. Right paracentral residual disc contacts and slightly distorts the anterior aspect of the cord. Foramina patent.  C7-T1: Bilateral cervical ribs at C7. Changes of instrumented ACDF with solid-appearing fusion across the interspace. Mild  residual disc bulge which approaches but does not distort the cervical cord. Left foraminal encroachment secondary to uncovertebral and facet spurs.  T1-T2: Broad midline bulge minimally distorts the anterior aspect of the cervical cord. No spinal stenosis. Foramina patent.  Bilateral carotid bifurcation calcified plaque. Regional soft tissues otherwise unremarkable.  IMPRESSION: 1. Solid-appearing interbody fusion C4-T1. 2. Adjacent level disease C3-4 with mild spinal stenosis and foraminal encroachment. 3. Broad midline bulge T1-T2 minimally distorts the anterior aspect of the cervical cord, without spinal stenosis. 4. Bilateral cervical ribs at C7. 5. Bilateral carotid bifurcation calcified plaque.   Electronically Signed By: Lucrezia Europe M.D. On: 06/17/2020 11:29    Narrative CLINICAL DATA:  Radiculopathy status post fusion.  EXAM: CERVICAL SPINE - COMPLETE 4+ VIEW  COMPARISON:  Prior reports from 2015.  FINDINGS: No signs of acute fracture subluxation with multilevel bony fusion beginning at the C4 level and extending through T1. There is anterior plate and screw fixation/fusion of the spine beginning at the C6 level as before, at line in the previous report.  No signs of acute bone finding. A left-sided neural foraminal narrowing at C3-4. Also with severe neural foraminal narrowing at C3-4 on the right.  No signs of acute fracture.  Prevertebral soft tissues are normal.  IMPRESSION: Multilevel spinal fusion with bony and hardware fusion as outlined in previous reports.  Neural foraminal narrowing bilaterally at C3-C4.   Electronically Signed By: Zetta Bills M.D. On: 07/12/2019 17:33    Narrative CLINICAL DATA:  Left hand pain, cervical radiculopathy, previous fusion surgery  EXAM: CERVICAL MYELOGRAM  CT CERVICAL SPINE WITH INTRATHECAL CONTRAST  TECHNIQUE: The procedure, risks (including but not limited to bleeding, infection, organ damage  ), benefits, and alternatives were explained to the patient. Questions regarding the  procedure were encouraged and answered. The patient understands and consents to the procedure. An appropriate entry site was determined under fluoroscopy. Operator donned sterile gloves and mask. Skin site was marked,prepped with Betadine, and draped in usual sterile fashion, and infiltrated locally with 1% lidocaine. A 22 gauge spinal needle was advanced into the thecal sac at L3 from a right parasagittal approach. Clear colorless CSF returned. 10 ml Omnipaque 300 were administered intrathecally for cervical myelography, followed by axial CT scanning of the cervical spine. I personally performed the lumbar puncture and administered the intrathecal contrast. I also personally supervised acquisition of the myelogram images. Coronal and sagittal reconstructions were generated.  FINDINGS: On myelography, there was initially apparent myelographic blockage at the thoracolumbar junction with little contrast passing into the thoracic and cervical region on fluoroscopy. On initial CT, there was poor opacification of the CSF at in the cervical region. However, limited CT through the thoracolumbar junction demonstrated good intrathecal opacification without evidence of mixed injection. With further patient manipulation, improved opacification of the cervical region was achieved.  Normal alignment.  Negative for fracture or focal bone lesion.  C2-3: Mild narrowing of the interspace. Mild circumferential disc bulge. Right uncovertebral spurs minimally encroach upon the neural foramen. Central canal patent.  C3-4: Marked narrowing of the interspace with broad posterior disc bulge and associated endplate spurs. Mild spinal stenosis without cord distortion. Mild encroachment upon bilateral neural foramina by uncovertebral and facet spurs.  C4-5: Solid-appearing interbody fusion. Central canal and  foramina patent.  C5-6: Solid-appearing interbody fusion. Central canal and foramina patent.  C6-7: Solid-appearing interbody fusion. Right paracentral residual disc contacts and slightly distorts the anterior aspect of the cord. Foramina patent.  C7-T1: Bilateral cervical ribs at C7. Changes of instrumented ACDF with solid-appearing fusion across the interspace. Mild residual disc bulge which approaches but does not distort the cervical cord. Left foraminal encroachment secondary to uncovertebral and facet spurs.  T1-T2: Broad midline bulge minimally distorts the anterior aspect of the cervical cord. No spinal stenosis. Foramina patent.  Bilateral carotid bifurcation calcified plaque. Regional soft tissues otherwise unremarkable.  IMPRESSION: 1. Solid-appearing interbody fusion C4-T1. 2. Adjacent level disease C3-4 with mild spinal stenosis and foraminal encroachment. 3. Broad midline bulge T1-T2 minimally distorts the anterior aspect of the cervical cord, without spinal stenosis. 4. Bilateral cervical ribs at C7. 5. Bilateral carotid bifurcation calcified plaque.   Electronically Signed By: Lucrezia Europe M.D. On: 06/17/2020 11:29 CLINICAL DATA:  Upper lumbar back pain. Bilateral hip and thigh pain. Spondylosis without myelopathy.  EXAM: LUMBAR MYELOGRAM  FLUOROSCOPY TIME:  0 min 43 seconds.  Ten images.  PROCEDURE: After thorough discussion of risks and benefits of the procedure including bleeding, infection, injury to nerves, blood vessels, adjacent structures as well as headache and CSF leak, written and oral informed consent was obtained. Consent was obtained by Dr. Nelson Chimes. Time out form was completed.  Patient was positioned prone on the fluoroscopy table. Local anesthesia was provided with 1% lidocaine without epinephrine after prepped and draped in the usual sterile fashion. Puncture was performed at right L5-S1 using a 3 1/2 inch 22-gauge spinal  needle via right approach. Using a single pass through the dura, the needle was placed within the thecal sac, with return of clear CSF. 15 mL of Omnipaque-180 was injected into the thecal sac, with normal opacification of the nerve roots and cauda equina consistent with free flow within the subarachnoid space.  I personally performed the lumbar puncture and administered the  intrathecal contrast. I also personally performed acquisition of the myelogram images.  TECHNIQUE: Contiguous axial images were obtained through the Lumbar spine after the intrathecal infusion of infusion. Coronal and sagittal reconstructions were obtained of the axial image sets.  COMPARISON:  MRI 03/22/2014  FINDINGS: LUMBAR MYELOGRAM FINDINGS:  T12 has diminutive ribs. There is curvature convex to the left with the apex at L2.  There is multifactorial spinal stenosis worse at L1-2 than at L2-3. Neural compression could occur on either or both sides at those levels. Mild lateral recess narrowing is seen bilaterally at L4-5. Lateral views show 2 mm of anterolisthesis at L1-2 and 3 mm retrolisthesis at L2-3. Anterior extradural defects are present, largest at L1-2 and L2-3. Standing flexion extension views show a worsened appearance of the stenosis at L1-2. There is 2 mm of anterolisthesis at L4-5 with flexion.  CT LUMBAR MYELOGRAM FINDINGS:  T11-12: Mild bulging of the disc. Mild facet degeneration. No stenosis.  T12-L1: No disc pathology. Mild facet hypertrophy. No stenosis. Conus tip at lower L1.  L1-2: Advanced chronic disc degeneration more severe on the left. Near complete loss of disc height. Small endplate osteophytes. Broad-based disc herniation more prominent towards the left. Bilateral facet and ligamentous hypertrophy. Severe stenosis of the lateral recesses and foramina, left worse than right. Neural compression seems likely at this level in the lateral recesses and the intervertebral  foramina, particularly the left.  L2-3: Advanced chronic disc degeneration more severe on the left. Endplate osteophytes and circumferential protrusion of disc material. Retrolisthesis of 3 mm. Facet and ligamentous hypertrophy. Stenosis of the lateral recesses and neural foramina that could cause neural compression on either or both sides. Findings slightly worse on the left.  L3-4: Previous left hemilaminectomy. Mild bulging of the disc, more prominent on the left. No compressive central canal stenosis. Foraminal narrowing on the left that could possibly affect the exiting L3 nerve root.  L4-5: Previous partial left hemilaminectomy. Bilateral facet arthropathy with hypertrophic change, right worse than left. Anterolisthesis of 1 mm. Bulging of the disc. Narrowing of the subarticular lateral recesses that could cause neural compression on either or both sides, particularly with standing or flexion.  L5-S1: Bilateral facet arthropathy with 1 mm of anterolisthesis. Mild bulging of the disc. No compressive stenosis of the canal. Moderate foraminal narrowing bilaterally but without gross neural compression.  IMPRESSION: Curvature convex to the right. Degenerative changes throughout the lumbar spine, most pronounced on the left at L1-2 and L2-3.  L1-2: Broad-based herniation of disc material more prominent towards the left. Bilateral facet and ligamentous hypertrophy. Stenosis of both subarticular lateral recesses that could cause neural compression on either side. Foraminal stenosis bilaterally, left worse than right. Foraminal neural compression is quite likely, particularly on the left.  L2-3: Retrolisthesis of 3 mm. Broad-based herniation of disc material more prominent towards the left. Bilateral facet and ligamentous hypertrophy. Stenosis of both subarticular lateral recesses that could cause neural compression on either side. Foraminal narrowing that could cause neural  compression on either side, more likely the left.  L3-4: Previous left-sided hemilaminectomy. Left foraminal osteophyte and disc protrusion that would have potential to affect the left L3 nerve root.  L4-5: Previous left-sided hemilaminectomy. Bilateral facet arthropathy. Bulging of the disc. Narrowing of the subarticular lateral recesses and foramina. Definite neural compression is not established, but this appearance could worsen with standing or flexion.  L5-S1: Bilateral facet arthropathy. Moderate foraminal narrowing bilaterally but without definite focal neural compression.   Electronically Signed By:  Nelson Chimes M.D. On: 11/02/2014 14:34    Narrative CLINICAL DATA:  Bilateral L1-L2 laminectomy and foraminotomy  EXAM: LUMBAR SPINE - 2-3 VIEW  COMPARISON:  CT scan of the lumbar spine of November 02, 2014  FINDINGS: The image labeled #1 reveals a metallic marker needle lying 2 cm posterior to the posterior margin of the T12-L1 disc space.  The image labeled #2 reveals a scan question a tissues plantar device overlying the upper upper portion of the spinous process of L2 with a metallic trocar projecting 2.5 cm posterior to the L1-L2 disc space.  IMPRESSION: Lateral lumbar localization films with findings as described.   Electronically Signed By: David  Martinique On: 11/13/2014 12:28  Lumbar DG (Complete) 4+V: Results for orders placed during the hospital encounter of 10/12/18  DG Lumbar Spine Complete  Narrative CLINICAL DATA:  acute back pain with multiple prior back surgeries  EXAM: LUMBAR SPINE - COMPLETE 4+ VIEW  COMPARISON:  09/17/2015 and older studies.  FINDINGS: No fracture.  Slight anterolisthesis of L4 on L5.  Status post posterior lumbar spine fusion, L1 through L3. Bilateral pedicle screws are well-seated. There are well-positioned intervertebral cages at the L1-L2 and L2-L3 disc interspaces.  Mild loss of disc height at L3-L4. Small  endplate osteophytes noted from L1 through L4. Facet degenerative changes are noted bilaterally in the lower lumbar spine.  Mild dextroscoliosis, apex at L2.  Soft tissues are unremarkable.  IMPRESSION: 1. No fracture or acute finding. 2. Status post posterior lumbar spine fusion, L1 through L3. Orthopedic hardware is well positioned and stable from the operative images dated 09/17/2015. 3. Degenerative changes of the lower lumbar spine.   Electronically Signed By: Lajean Manes M.D. On: 10/12/2018 15:36  DG MYELOGRAPHY LUMBAR INJ CERVICAL  Narrative CLINICAL DATA:  Left hand pain, cervical radiculopathy, previous fusion surgery  EXAM: CERVICAL MYELOGRAM  CT CERVICAL SPINE WITH INTRATHECAL CONTRAST  TECHNIQUE: The procedure, risks (including but not limited to bleeding, infection, organ damage ), benefits, and alternatives were explained to the patient. Questions regarding the procedure were encouraged and answered. The patient understands and consents to the procedure. An appropriate entry site was determined under fluoroscopy. Operator donned sterile gloves and mask. Skin site was marked,prepped with Betadine, and draped in usual sterile fashion, and infiltrated locally with 1% lidocaine. A 22 gauge spinal needle was advanced into the thecal sac at L3 from a right parasagittal approach. Clear colorless CSF returned. 10 ml Omnipaque 300 were administered intrathecally for cervical myelography, followed by axial CT scanning of the cervical spine. I personally performed the lumbar puncture and administered the intrathecal contrast. I also personally supervised acquisition of the myelogram images. Coronal and sagittal reconstructions were generated.  FINDINGS: On myelography, there was initially apparent myelographic blockage at the thoracolumbar junction with little contrast passing into the thoracic and cervical region on fluoroscopy. On initial CT, there was poor  opacification of the CSF at in the cervical region. However, limited CT through the thoracolumbar junction demonstrated good intrathecal opacification without evidence of mixed injection. With further patient manipulation, improved opacification of the cervical region was achieved.  Normal alignment.  Negative for fracture or focal bone lesion.  C2-3: Mild narrowing of the interspace. Mild circumferential disc bulge. Right uncovertebral spurs minimally encroach upon the neural foramen. Central canal patent.  C3-4: Marked narrowing of the interspace with broad posterior disc bulge and associated endplate spurs. Mild spinal stenosis without cord distortion. Mild encroachment upon bilateral neural foramina by uncovertebral and  facet spurs.  C4-5: Solid-appearing interbody fusion. Central canal and foramina patent.  C5-6: Solid-appearing interbody fusion. Central canal and foramina patent.  C6-7: Solid-appearing interbody fusion. Right paracentral residual disc contacts and slightly distorts the anterior aspect of the cord. Foramina patent.  C7-T1: Bilateral cervical ribs at C7. Changes of instrumented ACDF with solid-appearing fusion across the interspace. Mild residual disc bulge which approaches but does not distort the cervical cord. Left foraminal encroachment secondary to uncovertebral and facet spurs.  T1-T2: Broad midline bulge minimally distorts the anterior aspect of the cervical cord. No spinal stenosis. Foramina patent.  Bilateral carotid bifurcation calcified plaque. Regional soft tissues otherwise unremarkable.  IMPRESSION: 1. Solid-appearing interbody fusion C4-T1. 2. Adjacent level disease C3-4 with mild spinal stenosis and foraminal encroachment. 3. Broad midline bulge T1-T2 minimally distorts the anterior aspect of the cervical cord, without spinal stenosis. 4. Bilateral cervical ribs at C7. 5. Bilateral carotid bifurcation calcified  plaque.   Electronically Signed By: Lucrezia Europe M.D. On: 06/17/2020 11:29    Narrative CLINICAL DATA:  Upper lumbar back pain. Bilateral hip and thigh pain. Spondylosis without myelopathy.  EXAM: LUMBAR MYELOGRAM  FLUOROSCOPY TIME:  0 min 43 seconds.  Ten images.  PROCEDURE: After thorough discussion of risks and benefits of the procedure including bleeding, infection, injury to nerves, blood vessels, adjacent structures as well as headache and CSF leak, written and oral informed consent was obtained. Consent was obtained by Dr. Nelson Chimes. Time out form was completed.  Patient was positioned prone on the fluoroscopy table. Local anesthesia was provided with 1% lidocaine without epinephrine after prepped and draped in the usual sterile fashion. Puncture was performed at right L5-S1 using a 3 1/2 inch 22-gauge spinal needle via right approach. Using a single pass through the dura, the needle was placed within the thecal sac, with return of clear CSF. 15 mL of Omnipaque-180 was injected into the thecal sac, with normal opacification of the nerve roots and cauda equina consistent with free flow within the subarachnoid space.  I personally performed the lumbar puncture and administered the intrathecal contrast. I also personally performed acquisition of the myelogram images.  TECHNIQUE: Contiguous axial images were obtained through the Lumbar spine after the intrathecal infusion of infusion. Coronal and sagittal reconstructions were obtained of the axial image sets.  COMPARISON:  MRI 03/22/2014  FINDINGS: LUMBAR MYELOGRAM FINDINGS:  T12 has diminutive ribs. There is curvature convex to the left with the apex at L2.  There is multifactorial spinal stenosis worse at L1-2 than at L2-3. Neural compression could occur on either or both sides at those levels. Mild lateral recess narrowing is seen bilaterally at L4-5. Lateral views show 2 mm of anterolisthesis at L1-2 and  3 mm retrolisthesis at L2-3. Anterior extradural defects are present, largest at L1-2 and L2-3. Standing flexion extension views show a worsened appearance of the stenosis at L1-2. There is 2 mm of anterolisthesis at L4-5 with flexion.  CT LUMBAR MYELOGRAM FINDINGS:  T11-12: Mild bulging of the disc. Mild facet degeneration. No stenosis.  T12-L1: No disc pathology. Mild facet hypertrophy. No stenosis. Conus tip at lower L1.  L1-2: Advanced chronic disc degeneration more severe on the left. Near complete loss of disc height. Small endplate osteophytes. Broad-based disc herniation more prominent towards the left. Bilateral facet and ligamentous hypertrophy. Severe stenosis of the lateral recesses and foramina, left worse than right. Neural compression seems likely at this level in the lateral recesses and the intervertebral foramina, particularly the left.  L2-3: Advanced chronic disc degeneration more severe on the left. Endplate osteophytes and circumferential protrusion of disc material. Retrolisthesis of 3 mm. Facet and ligamentous hypertrophy. Stenosis of the lateral recesses and neural foramina that could cause neural compression on either or both sides. Findings slightly worse on the left.  L3-4: Previous left hemilaminectomy. Mild bulging of the disc, more prominent on the left. No compressive central canal stenosis. Foraminal narrowing on the left that could possibly affect the exiting L3 nerve root.  L4-5: Previous partial left hemilaminectomy. Bilateral facet arthropathy with hypertrophic change, right worse than left. Anterolisthesis of 1 mm. Bulging of the disc. Narrowing of the subarticular lateral recesses that could cause neural compression on either or both sides, particularly with standing or flexion.  L5-S1: Bilateral facet arthropathy with 1 mm of anterolisthesis. Mild bulging of the disc. No compressive stenosis of the canal. Moderate foraminal narrowing  bilaterally but without gross neural compression.  IMPRESSION: Curvature convex to the right. Degenerative changes throughout the lumbar spine, most pronounced on the left at L1-2 and L2-3.  L1-2: Broad-based herniation of disc material more prominent towards the left. Bilateral facet and ligamentous hypertrophy. Stenosis of both subarticular lateral recesses that could cause neural compression on either side. Foraminal stenosis bilaterally, left worse than right. Foraminal neural compression is quite likely, particularly on the left.  L2-3: Retrolisthesis of 3 mm. Broad-based herniation of disc material more prominent towards the left. Bilateral facet and ligamentous hypertrophy. Stenosis of both subarticular lateral recesses that could cause neural compression on either side. Foraminal narrowing that could cause neural compression on either side, more likely the left.  L3-4: Previous left-sided hemilaminectomy. Left foraminal osteophyte and disc protrusion that would have potential to affect the left L3 nerve root.  L4-5: Previous left-sided hemilaminectomy. Bilateral facet arthropathy. Bulging of the disc. Narrowing of the subarticular lateral recesses and foramina. Definite neural compression is not established, but this appearance could worsen with standing or flexion.  L5-S1: Bilateral facet arthropathy. Moderate foraminal narrowing bilaterally but without definite focal neural compression.   Electronically Signed By: Nelson Chimes M.D. On: 11/02/2014 14:34     Complexity Note: Imaging results reviewed. Results shared with Linda Robinson, using [employer].                         ROS  Cardiovascular: Daily Aspirin intake and High blood pressure Pulmonary or Respiratory: No reported pulmonary signs or symptoms such as wheezing and difficulty taking a deep full breath (Asthma), difficulty blowing air out (Emphysema), coughing up mucus (Bronchitis), persistent dry  cough, or temporary stoppage of breathing during sleep Neurological: No reported neurological signs or symptoms such as seizures, abnormal skin sensations, urinary and/or fecal incontinence, being born with an abnormal open spine and/or a tethered spinal cord Psychological-Psychiatric: No reported psychological or psychiatric signs or symptoms such as difficulty sleeping, anxiety, depression, delusions or hallucinations (schizophrenial), mood swings (bipolar disorders) or suicidal ideations or attempts Gastrointestinal: Irregular, infrequent bowel movements (Constipation) Genitourinary: No reported renal or genitourinary signs or symptoms such as difficulty voiding or producing urine, peeing blood, non-functioning kidney, kidney stones, difficulty emptying the bladder, difficulty controlling the flow of urine, or chronic kidney disease Hematological: No reported hematological signs or symptoms such as prolonged bleeding, low or poor functioning platelets, bruising or bleeding easily, hereditary bleeding problems, low energy levels due to low hemoglobin or being anemic Endocrine: No reported endocrine signs or symptoms such as high or low blood sugar,  rapid heart rate due to high thyroid levels, obesity or weight gain due to slow thyroid or thyroid disease Rheumatologic: Joint aches and or swelling due to excess weight (Osteoarthritis) Musculoskeletal: Negative for myasthenia gravis, muscular dystrophy, multiple sclerosis or malignant hyperthermia Work History: Retired  Allergies  Linda Robinson is allergic to ace inhibitors, bactrim [sulfamethoxazole-trimethoprim], cephalosporins, colesevelam, hctz [hydrochlorothiazide], losartan, lovastatin, raloxifene, rosuvastatin, and simvastatin.  Laboratory Chemistry Profile   Renal Lab Results  Component Value Date   BUN 29 (H) 06/05/2020   CREATININE 1.04 06/05/2020   BCR 27 (H) 11/17/2019   GFR 49.86 (L) 06/05/2020   GFRAA 47 (L) 12/03/2018   GFRNONAA 41  (L) 12/03/2018   SPECGRAV 1.010 02/10/2019   PHUR 6.0 02/10/2019   PROTEINUR Negative 02/10/2019     Electrolytes Lab Results  Component Value Date   NA 143 06/05/2020   K 4.7 06/05/2020   CL 101 06/05/2020   CALCIUM 9.8 06/05/2020   PHOS 3.0 09/15/2013     Hepatic Lab Results  Component Value Date   AST 16 06/05/2020   ALT 8 06/05/2020   ALBUMIN 4.5 06/05/2020   ALKPHOS 59 06/05/2020   LIPASE 17.0 02/22/2007     ID Lab Results  Component Value Date   SARSCOV2NAA Not Detected 05/29/2019   STAPHAUREUS NEGATIVE 11/09/2014   MRSAPCR NEGATIVE 11/09/2014     Bone Lab Results  Component Value Date   VD25OH 50.88 06/27/2014     Endocrine Lab Results  Component Value Date   GLUCOSE 86 06/05/2020   GLUCOSEU Negative 10/30/2015   HGBA1C 5.8 06/05/2020   TSH 3.12 06/05/2020     Neuropathy Lab Results  Component Value Date   VITAMINB12 661 02/23/2017   HGBA1C 5.8 06/05/2020     CNS No results found for: COLORCSF, APPEARCSF, RBCCOUNTCSF, WBCCSF, POLYSCSF, LYMPHSCSF, EOSCSF, PROTEINCSF, GLUCCSF, JCVIRUS, CSFOLI, IGGCSF, LABACHR, ACETBL, LABACHR, ACETBL   Inflammation (CRP: Acute   ESR: Chronic) No results found for: CRP, ESRSEDRATE, LATICACIDVEN   Rheumatology Lab Results  Component Value Date   LABURIC 5.8 05/25/2019     Coagulation Lab Results  Component Value Date   INR 1.0 03/09/2009   LABPROT 12.9 03/09/2009   APTT 28 03/09/2009   PLT 263.0 06/05/2020     Cardiovascular Lab Results  Component Value Date   TROPONINI <0.03 03/06/2017   HGB 14.2 06/05/2020   HCT 43.0 06/05/2020     Screening Lab Results  Component Value Date   SARSCOV2NAA Not Detected 05/29/2019   STAPHAUREUS NEGATIVE 11/09/2014   MRSAPCR NEGATIVE 11/09/2014     Cancer No results found for: CEA, CA125, LABCA2   Allergens No results found for: ALMOND, APPLE, ASPARAGUS, AVOCADO, BANANA, BARLEY, BASIL, BAYLEAF, GREENBEAN, LIMABEAN, WHITEBEAN, BEEFIGE, REDBEET, BLUEBERRY,  BROCCOLI, CABBAGE, MELON, CARROT, CASEIN, CASHEWNUT, CAULIFLOWER, CELERY     Note: Lab results reviewed.  [place]  Drug: Linda Robinson  reports no history of drug use. Alcohol:  reports no history of alcohol use. Tobacco:  reports that she has never smoked. She has never used smokeless tobacco. Medical:  has a past medical history of Ankylosis of lumbar spine, Basal cell carcinoma, Cervical spine pain, Chest pain, Chronic back pain, Constipation, Depression, Diverticulosis of colon, GERD (gastroesophageal reflux disease), Gout, History of diverticulitis of colon, HLD (hyperlipidemia), HTN (hypertension), Lumbar spondylosis with myelopathy, Numbness, OA (osteoarthritis), Osteopenia, Scoliosis, and Vertigo. Family: family history includes Alzheimer's disease in her mother; Cancer in her father; Diabetes in her father; Heart attack in her father; Hypertension in her  father and mother; Stroke in her mother.  Past Surgical History:  Procedure Laterality Date   APPENDECTOMY  at age 59   BACK SURGERY     CATARACT EXTRACTION Right 04/2006   CHOLECYSTECTOMY  1970   COLONOSCOPY     KNEE SURGERY Right    Medial meniscus tear   LUMBAR LAMINECTOMY/DECOMPRESSION MICRODISCECTOMY Bilateral 11/13/2014   Procedure: Lumbar one-two Bilateral laminectomy and foraminotomy, Left Lumbar one-two microdiscectomy ;  Surgeon: Faythe Ghee, MD;  Location: [facility];  Service: Neurosurgery;  Laterality: Bilateral;   NECK SURGERY  1990-2021   x 3; fusions   PARTIAL COLECTOMY  09/1999   d/t diverticulitis   TUBAL LIGATION     Active Ambulatory Problems    Diagnosis Date Noted   Hyperlipidemia LDL goal <130 02/04/2007   Gout 02/04/2007   Depression with anxiety 02/14/2007   Essential hypertension 02/04/2007   DIVERTICULOSIS, COLON 02/04/2007   CONSTIPATION 11/01/2007   Renal insufficiency 02/04/2007   OSTEOARTHRITIS 02/04/2007   DEGENERATIVE [place] DISEASE 02/04/2007   Osteopenia 01/30/2009    GERD (gastroesophageal reflux disease)    Encounter for Medicare annual wellness exam 04/11/2013   Edema 04/26/2013   Low back pain on right side with sciatica 07/24/2013   Risk for falls 06/27/2014   Vertigo 10/05/2014   Lumbar spinal stenosis 11/13/2014   Prediabetes 01/01/2015   Routine general medical examination at a health care facility 07/05/2015   Estrogen deficiency 07/05/2015   Weight loss 02/23/2017   Atherosclerosis of aorta ([place]) 03/09/2017   Substernal chest pain 08/16/2017   Screening mammogram, encounter for 05/23/2018   Poor balance 06/12/2019   Grief reaction 11/17/2019   Cervical stenosis of spinal canal 11/17/2019   Hearing loss 11/17/2019   Generalized weakness 05/01/2020   Failed neck syndrome 07/30/2020   Left hand pain 07/30/2020   Cervical radicular pain (left) 09/12/2020   Cervical disc disorder with radiculopathy of cervical region 09/12/2020   Cervical spondylosis 09/12/2020   Cervical fusion syndrome (C4-T1 ACDF) 09/12/2020   Neuropathic pain of hand, left 09/12/2020   Chronic pain syndrome 09/12/2020   Resolved Ambulatory Problems    Diagnosis Date Noted   Bradycardia 05/19/2010   CYSTITIS, HEMORRHAGIC 02/04/2007   Cystitis cystica 02/04/2007   UTI 08/27/2010   LEG CRAMPS 02/04/2007   Personal History of Other Diseases of Digestive Disease 02/04/2007   History of diverticulitis of colon    Diverticulosis of colon    Gout    HLD (hyperlipidemia)    HTN (hypertension)    OA (osteoarthritis)    Osteopenia    Basal cell carcinoma    Sinusitis 06/26/2011   Gastroenteritis, acute 12/22/2011   Viral URI 01/05/2012   UTI (lower urinary tract infection) 01/05/2012   Lymphocytosis 01/05/2012   Colon cancer screening 02/10/2012   UTI (urinary tract infection) 04/26/2013   Fall (on)(from) incline, initial encounter 09/15/2013   Elevated transaminase level 07/16/2014   Leukocytosis 07/16/2014    Reaction to influenza immunization 08/09/2015   Viral URI 10/22/2015   UTI (urinary tract infection) 10/22/2015   Dysuria 10/24/2015   Dizziness 05/19/2016   Chest discomfort 02/23/2017   Chest pain 03/05/2017   Costochondritis 03/09/2017   Acute sinusitis 09/20/2018   Contusion of face 09/20/2018   UTI (urinary tract infection) 01/25/2019   URI (upper respiratory infection) 05/29/2019   Past Medical History:  Diagnosis Date   Ankylosis of lumbar spine    Cervical spine pain    Chronic back pain  Constipation    Depression    Lumbar spondylosis with myelopathy    Numbness    Scoliosis    Constitutional Exam  General appearance: alert, cooperative and in mild distress Vitals:   09/12/20 0947  BP: (!) 122/104  Pulse: 75  Resp: 18  Temp: (!) 97.5 F (36.4 C)  TempSrc: Temporal  SpO2: 99%  Weight: 137 lb 6.4 oz (62.3 kg)  Height: 5' 1"  (1.549 m)   BMI Assessment: Estimated body mass index is 25.96 kg/m as calculated from the following:   Height as of this encounter: 5' 1"  (1.549 m).   Weight as of this encounter: 137 lb 6.4 oz (62.3 kg).  BMI interpretation table: BMI level Category Range association with higher incidence of chronic pain  <18 kg/m2 Underweight   18.5-24.9 kg/m2 Ideal body weight   25-29.9 kg/m2 Overweight Increased incidence by 20%  30-34.9 kg/m2 Obese (Class I) Increased incidence by 68%  35-39.9 kg/m2 Severe obesity (Class II) Increased incidence by 136%  >40 kg/m2 Extreme obesity (Class III) Increased incidence by 254%   Patient's current BMI Ideal Body weight  Body mass index is 25.96 kg/m. Ideal body weight: 47.8 kg (105 lb 6.1 oz) Adjusted ideal body weight: 53.6 kg (118 lb 3 oz)   BMI Readings from Last 4 Encounters:  09/12/20 25.96 kg/m  07/17/20 25.67 kg/m  06/12/20 25.34 kg/m  05/20/20 25.43 kg/m   Wt Readings from Last 4 Encounters:  09/12/20 137 lb 6.4 oz (62.3 kg)  07/17/20 134 lb 12 oz (61.1 kg)   06/12/20 133 lb (60.3 kg)  05/20/20 134 lb 9.6 oz (61.1 kg)    Psych/Mental status: Alert, oriented x 3 (person, place, & time)       Eyes: PERLA Respiratory: No evidence of acute respiratory distress  Cervical Spine Exam  Skin & Axial Inspection: Well healed scar from previous spine surgery detected Alignment: Asymmetric Functional ROM: Pain restricted ROM      Stability: No instability detected Muscle Tone/Strength: Functionally intact. No obvious neuro-muscular anomalies detected. Sensory (Neurological): Dermatomal pain pattern Palpation: No palpable anomalies             Positive Spurling's on the left Upper Extremity (UE) Exam    Side: Right upper extremity  Side: Left upper extremity  Skin & Extremity Inspection: Skin color, temperature, and hair growth are WNL. No peripheral edema or cyanosis. No masses, redness, swelling, asymmetry, or associated skin lesions. No contractures.  Skin & Extremity Inspection: Skin color, temperature, and hair growth are WNL. No peripheral edema or cyanosis. No masses, redness, swelling, asymmetry, or associated skin lesions. No contractures.  Functional ROM: Unrestricted ROM          Functional ROM: Pain restricted ROM for all joints of upper extremity  Muscle Tone/Strength: Functionally intact. No obvious neuro-muscular anomalies detected.   Muscle Tone/Strength: Functionally intact. No obvious neuro-muscular anomalies detected.  Sensory (Neurological): Unimpaired          Sensory (Neurological): Dermatomal pain pattern bicep, tricep, brachioradialis          Palpation: No palpable anomalies              Palpation: No palpable anomalies              Provocative Test(s):  Phalen's test: deferred Tinel's test: deferred Apley's scratch test (touch opposite shoulder):  Action 1 (Across chest): deferred Action 2 (Overhead): deferred Action 3 (LB reach): deferred   Provocative Test(s):  Phalen's test: deferred Tinel's test:  deferred Apley's  scratch test (touch opposite shoulder):  Action 1 (Across chest): Decreased ROM Action 2 (Overhead): Decreased ROM Action 3 (LB reach): Decreased ROM    Thoracic Spine Area Exam  Skin & Axial Inspection: No masses, redness, or swelling Alignment: Symmetrical Functional ROM: Unrestricted ROM Stability: No instability detected Muscle Tone/Strength: Functionally intact. No obvious neuro-muscular anomalies detected. Sensory (Neurological): Musculoskeletal pain pattern  Lumbar Exam  Skin & Axial Inspection: No masses, redness, or swelling Alignment: Symmetrical Functional ROM: Pain restricted ROM  Stability: No instability detected Muscle Tone/Strength: Functionally intact. No obvious neuro-muscular anomalies detected. Sensory (Neurological): Musculoskeletal pain pattern   Gait & Posture Assessment  Ambulation: Patient came in today in a wheel chair Gait: Age-related, senile gait pattern Posture: Difficulty standing up straight, due to pain   Lower Extremity Exam    Side: Right lower extremity  Side: Left lower extremity  Stability: No instability observed          Stability: No instability observed          Skin & Extremity Inspection: Skin color, temperature, and hair growth are WNL. No peripheral edema or cyanosis. No masses, redness, swelling, asymmetry, or associated skin lesions. No contractures.  Skin & Extremity Inspection: Skin color, temperature, and hair growth are WNL. No peripheral edema or cyanosis. No masses, redness, swelling, asymmetry, or associated skin lesions. No contractures.  Functional ROM: Unrestricted ROM                  Functional ROM: Unrestricted ROM                  Muscle Tone/Strength: Functionally intact. No obvious neuro-muscular anomalies detected.  Muscle Tone/Strength: Functionally intact. No obvious neuro-muscular anomalies detected.  Sensory (Neurological): Unimpaired        Sensory (Neurological): Unimpaired        DTR: Patellar: deferred  today Achilles: deferred today Plantar: deferred today  DTR: Patellar: deferred today Achilles: deferred today Plantar: deferred today  Palpation: No palpable anomalies  Palpation: No palpable anomalies   Assessment  Primary Diagnosis & Pertinent Problem List: The primary encounter diagnosis was Cervical radicular pain (left). Diagnoses of Neuropathic pain of hand, left, Cervical disc disorder with radiculopathy of cervical region, Cervical spondylosis, Cervical fusion syndrome (C4-T1 ACDF), and Chronic pain syndrome were also pertinent to this visit.  Visit Diagnosis (New problems to examiner): 1. Cervical radicular pain (left)   2. Neuropathic pain of hand, left   3. Cervical disc disorder with radiculopathy of cervical region   4. Cervical spondylosis   5. Cervical fusion syndrome (C4-T1 ACDF)   6. Chronic pain syndrome    Plan of Care (Initial workup plan)    Pharmacotherapy (current): Medications ordered:  Meds ordered this encounter  Medications   gabapentin (NEURONTIN) 100 MG capsule    Sig: Take 2 capsules (200 mg total) by mouth 2 (two) times daily as needed.    Dispense:  120 capsule    Refill:  1   Medications administered during this visit: Linda Robinson had no medications administered during this visit.   Pharmacological management options:  Opioid Analgesics: avoid  Membrane stabilizer: Increase gabapentin as above.  Consider Lyrica in future  Muscle relaxant: To be determined at a later time  NSAID: Adequate regimen ibuprofen 400 mg twice daily  Other analgesic(s): To be determined at a later time   Provider-requested follow-up: Return in about 6 weeks (around 10/24/2020) for Medication Management, virtual.  Future Appointments  Date  Time Provider [place]  10/24/2020  3:00 PM Gillis Santa, MD [facility] None    Note by: Gillis Santa, MD Date: 09/12/2020; Time: 1:44 PM

## 2020-09-12 NOTE — Progress Notes (Signed)
Safety precautions to be maintained throughout the outpatient stay will include: orient to surroundings, keep bed in low position, maintain call bell within reach at all times, provide assistance with transfer out of bed and ambulation.  

## 2020-09-12 NOTE — Patient Instructions (Signed)
Gabapentin has been escribed to your pharmacy.

## 2020-09-12 NOTE — Telephone Encounter (Signed)
Patient to call and find out specifics about pharmacy as there is some confusion about which one to use.She will call back.

## 2020-09-12 NOTE — Telephone Encounter (Signed)
Patient called back and said the number in the first msg is correct

## 2020-10-09 ENCOUNTER — Other Ambulatory Visit: Payer: Self-pay

## 2020-10-09 ENCOUNTER — Ambulatory Visit (INDEPENDENT_AMBULATORY_CARE_PROVIDER_SITE_OTHER): Payer: Medicare HMO | Admitting: Family Medicine

## 2020-10-09 ENCOUNTER — Encounter: Payer: Self-pay | Admitting: Family Medicine

## 2020-10-09 VITALS — BP 138/64 | HR 66 | Temp 96.9°F | Ht 60.75 in | Wt 136.2 lb

## 2020-10-09 DIAGNOSIS — R2689 Other abnormalities of gait and mobility: Secondary | ICD-10-CM | POA: Diagnosis not present

## 2020-10-09 DIAGNOSIS — M792 Neuralgia and neuritis, unspecified: Secondary | ICD-10-CM

## 2020-10-09 DIAGNOSIS — I10 Essential (primary) hypertension: Secondary | ICD-10-CM | POA: Diagnosis not present

## 2020-10-09 NOTE — Assessment & Plan Note (Signed)
Seeing pain specialist  Not tolerating gabapentin at recenly inc dose

## 2020-10-09 NOTE — Assessment & Plan Note (Signed)
bp in fair control at this time  BP Readings from Last 1 Encounters:  10/09/20 138/64   No changes needed Most recent labs reviewed  Disc lifstyle change with low sodium diet and exercise  Plan to continue amlodipine 10 mg daily and lasix 20 mg qd prn and hydralazine 10 mg daily

## 2020-10-09 NOTE — Assessment & Plan Note (Signed)
Pt has baseline poor balance (she can not remember if she did PT for this) but now increased problems after in gabapentin to 200 mg bid for hand pain  On fall with no injuries  Suspect she is not tolerating medication  Adv she go back to the prior dose (and let pain specialist know)  Disc fall prv  Consider PT for balance/gait (she declines currently)  Did note that pt shuffles when walking (needs support for R knee)  Has a cane here/ walker may be more helpful

## 2020-10-09 NOTE — Progress Notes (Signed)
Subjective:    Patient ID: Linda Robinson, female    DOB: [birth year], 87 y.o.   MRN: [id number]  This visit occurred during the SARS-CoV-2 public health emergency.  Safety protocols were in place, including screening questions prior to the visit, additional usage of staff PPE, and extensive cleaning of exam room while observing appropriate contact time as indicated for disinfecting solutions.    HPI Pt presents feeling off balance  Wt Readings from Last 3 Encounters:  10/09/20 136 lb 3 oz (61.8 kg)  09/12/20 137 lb 6.4 oz (62.3 kg)  07/17/20 134 lb 12 oz (61.1 kg)   25.94 kg/m   History of bad balance  Thinks that it got worse after inc the gabapentin (seen in pain clinic)   She decreased dose again 2 days on old dose 100 mg bid   Had one fall in the yard- was able to get up  ? If tripped/ was muddy  Had her cane   Uses a walker in the house  She was ref to PT 9/20 for balance Unsure if she went to that   No headaches  Hand is not improved- pain [place] /weak- L hand after failed neck surgery   Trying to learn how to crochet again - with L hand weakness     BP Readings from Last 3 Encounters:  10/09/20 138/64  09/12/20 (!) 122/104  07/17/20 130/70   Pulse Readings from Last 3 Encounters:  10/09/20 (!) 50  09/12/20 75  07/17/20 88   Lab Results  Component Value Date   CREATININE 1.04 06/05/2020   BUN 29 (H) 06/05/2020   NA 143 06/05/2020   K 4.7 06/05/2020   CL 101 06/05/2020   CO2 33 (H) 06/05/2020   Takes gabapentin 200 mg bid prn   Patient Active Problem List   Diagnosis Date Noted  . Cervical radicular pain (left) 09/12/2020  . Cervical disc disorder with radiculopathy of cervical region 09/12/2020  . Cervical spondylosis 09/12/2020  . Cervical fusion syndrome (C4-T1 ACDF) 09/12/2020  . Neuropathic pain of hand, left 09/12/2020  . Chronic pain syndrome 09/12/2020  . Failed neck syndrome 07/30/2020  . Left hand pain 07/30/2020  . Generalized  weakness 05/01/2020  . Grief reaction 11/17/2019  . Cervical stenosis of spinal canal 11/17/2019  . Hearing loss 11/17/2019  . Poor balance 06/12/2019  . Screening mammogram, encounter for 05/23/2018  . Substernal chest pain 08/16/2017  . Atherosclerosis of aorta ([place]) 03/09/2017  . Weight loss 02/23/2017  . Routine general medical examination at a health care facility 07/05/2015  . Estrogen deficiency 07/05/2015  . Prediabetes 01/01/2015  . Lumbar spinal stenosis 11/13/2014  . Vertigo 10/05/2014  . Risk for falls 06/27/2014  . Low back pain on right side with sciatica 07/24/2013  . Edema 04/26/2013  . Encounter for Medicare annual wellness exam 04/11/2013  . GERD (gastroesophageal reflux disease)   . Osteopenia 01/30/2009  . CONSTIPATION 11/01/2007  . Depression with anxiety 02/14/2007  . Hyperlipidemia LDL goal <130 02/04/2007  . Gout 02/04/2007  . Essential hypertension 02/04/2007  . DIVERTICULOSIS, COLON 02/04/2007  . Renal insufficiency 02/04/2007  . OSTEOARTHRITIS 02/04/2007  . [place] DISEASE 02/04/2007   Past Medical History:  Diagnosis Date  . Ankylosis of lumbar spine   . Basal cell carcinoma    removed  . Cervical spine pain   . Chest pain    a. 02/2017 MV: EF 54%, small, distal anterior wall/apical infacrt (likely misregistration), no ischemia-->Low risk;  b. 02/2017 Echo: EF 55-60%, mild MR, PASP 54mmHg.  Marland Kitchen Chronic back pain    stenosis  . Constipation    takes Colace daily  . Depression    takes Zoloft daily  . Diverticulosis of colon   . GERD (gastroesophageal reflux disease)    atypical chest pain  . Gout    hx of;was on Allopurinol but taken off 6 wks ago by medical MD  . History of diverticulitis of colon   . HLD (hyperlipidemia)    takes Pravastatin daily  . HTN (hypertension)    takes Amlodipine and carvedilol  . Lumbar spondylosis with myelopathy   . Numbness   . OA (osteoarthritis)    right knee  . Osteopenia    takes Calcium and  Vit D daily  . Scoliosis   . Vertigo    takes Antivert daily as needed   Past Surgical History:  Procedure Laterality Date  . APPENDECTOMY  at age 72  . BACK SURGERY    . CATARACT EXTRACTION Right 04/2006  . CHOLECYSTECTOMY  1970  . COLONOSCOPY    . KNEE SURGERY Right    Medial meniscus tear  . LUMBAR LAMINECTOMY/DECOMPRESSION MICRODISCECTOMY Bilateral 11/13/2014   Procedure: Lumbar one-two Bilateral laminectomy and foraminotomy, Left Lumbar one-two microdiscectomy ;  Surgeon: Faythe Ghee, MD;  Location: [facility];  Service: Neurosurgery;  Laterality: Bilateral;  . NECK SURGERY  1990-2021   x 3; fusions  . PARTIAL COLECTOMY  09/1999   d/t diverticulitis  . TUBAL LIGATION     Social History   Tobacco Use  . Smoking status: Never Smoker  . Smokeless tobacco: Never Used  Vaping Use  . Vaping Use: Never used  Substance Use Topics  . Alcohol use: Never    Alcohol/week: 0.0 standard drinks  . Drug use: Never   Family History  Problem Relation Age of Onset  . Cancer Father        throat and bladder  . Diabetes Father   . Hypertension Father   . Heart attack Father   . Stroke Mother   . Alzheimer's disease Mother   . Hypertension Mother    Allergies  Allergen Reactions  . Ace Inhibitors     REACTION: increased K+, increased creat.  . Bactrim [Sulfamethoxazole-Trimethoprim]     rash  . Cephalosporins     REACTION: reaction not known  . Colesevelam     REACTION: constipation  . Hctz [Hydrochlorothiazide]     Makes her feel "bad"  . Losartan     Increased K  . Lovastatin     REACTION: doesn't work  . Raloxifene     REACTION: cramps  . Rosuvastatin     REACTION: myalgia  . Simvastatin     REACTION: myalgia   Current Outpatient Medications on File Prior to Visit  Medication Sig Dispense Refill  . amLODipine (NORVASC) 10 MG tablet Take 1 tablet (10 mg total) by mouth daily. 90 tablet 3  . aspirin EC 81 MG tablet Take 1 tablet (81 mg total) by mouth daily.  90 tablet 3  . Calcium Carbonate-Vitamin D 500-125 MG-UNIT TABS Take by mouth.    . Cranberry 250 MG CAPS Take 1 capsule by mouth daily.    Marland Kitchen docusate sodium (COLACE) 100 MG capsule Take 1-2 capsules (100-200 mg total) by mouth 2 (two) times daily. 100 mg in the morning and 200 mg at night 180 capsule 1  . famotidine (PEPCID) 20 MG tablet Take 1 tablet (  20 mg total) by mouth 2 (two) times daily. 180 tablet 3  . fish oil-omega-3 fatty acids 1000 MG capsule Take 1 g by mouth at bedtime.    . furosemide (LASIX) 20 MG tablet TAKE 1 TABLET DAILY AS NEEDED FOR SWELLING 90 tablet 1  . gabapentin (NEURONTIN) 100 MG capsule Take 2 capsules (200 mg total) by mouth 2 (two) times daily as needed. 120 capsule 1  . hydrALAZINE (APRESOLINE) 10 MG tablet Take 1 tablet (10 mg total) by mouth daily. 90 tablet 3  . ibuprofen (ADVIL) 800 MG tablet Take 800 mg by mouth daily as needed.    . meclizine (ANTIVERT) 25 MG tablet Take 25 mg by mouth 4 (four) times daily as needed for dizziness.    Marland Kitchen omeprazole (PRILOSEC) 20 MG capsule Take 1 capsule (20 mg total) by mouth daily. 90 capsule 3  . pravastatin (PRAVACHOL) 20 MG tablet Take 1 tablet (20 mg total) by mouth daily. 90 tablet 3  . sertraline (ZOLOFT) 100 MG tablet Take 1 tablet (100 mg total) by mouth daily. 90 tablet 3  . Tetrahydrozoline HCl (EYE DROPS OP) Apply 1-2 drops to eye as needed (for dry eye).     No current facility-administered medications on file prior to visit.    Review of Systems  Constitutional: Negative for activity change, appetite change, fatigue, fever and unexpected weight change.  HENT: Negative for congestion, ear pain, rhinorrhea, sinus pressure and sore throat.   Eyes: Negative for pain, redness and visual disturbance.  Respiratory: Negative for cough, shortness of breath and wheezing.   Cardiovascular: Negative for chest pain and palpitations.  Gastrointestinal: Negative for abdominal pain, blood in stool, constipation and diarrhea.   Endocrine: Negative for polydipsia and polyuria.  Genitourinary: Negative for dysuria, frequency and urgency.  Musculoskeletal: Positive for arthralgias. Negative for back pain and myalgias.  Skin: Negative for pallor and rash.  Allergic/Immunologic: Negative for environmental allergies.  Neurological: Positive for weakness. Negative for dizziness, tremors, syncope, facial asymmetry, speech difficulty, light-headedness and headaches.       Altered sens and weakness of L hand basseline   Poor balance w/o dizziness  Hematological: Negative for adenopathy. Does not bruise/bleed easily.  Psychiatric/Behavioral: Negative for decreased concentration and dysphoric mood. The patient is not nervous/anxious.        Objective:   Physical Exam Constitutional:      General: She is not in acute distress.    Appearance: Normal appearance. She is well-developed, normal weight and well-nourished. She is not ill-appearing.  HENT:     Head: Normocephalic and atraumatic.     Mouth/Throat:     Mouth: Oropharynx is clear and moist.  Eyes:     General: No scleral icterus.    Extraocular Movements: EOM normal.     Conjunctiva/sclera: Conjunctivae normal.     Pupils: Pupils are equal, round, and reactive to light.  Neck:     Thyroid: No thyromegaly.     Vascular: No carotid bruit or JVD.     Comments: Limited rom of neck  Cardiovascular:     Rate and Rhythm: Normal rate and regular rhythm.     Pulses: Intact distal pulses.     Heart sounds: Normal heart sounds. No gallop.   Pulmonary:     Effort: Pulmonary effort is normal. No respiratory distress.     Breath sounds: Normal breath sounds. No wheezing or rales.     Comments: No crackles Abdominal:     General: Bowel sounds are  normal. There is no distension or abdominal bruit.     Palpations: Abdomen is soft. There is no mass.     Tenderness: There is no abdominal tenderness.  Musculoskeletal:        General: No edema.     Cervical back: Neck  supple.     Right lower leg: No edema.     Left lower leg: No edema.  Lymphadenopathy:     Cervical: No cervical adenopathy.  Skin:    General: Skin is warm and dry.     Coloration: Skin is not pale.     Findings: No erythema or rash.  Neurological:     Mental Status: She is alert.     Cranial Nerves: Cranial nerves are intact. No dysarthria or facial asymmetry.     Motor: Weakness and atrophy present. No tremor.     Coordination: Romberg sign negative. Finger-Nose-Finger Test normal.     Gait: Gait abnormal and tandem walk abnormal.     Deep Tendon Reflexes: Reflexes are normal and symmetric.     Comments: Generally poor balance  Shuffling gait (not wide based)  No tremor  Needs asst of cane or person to walk  No new weakness Romberg suprisingly normal   Weakness/atrophy in L hand baseline   Psychiatric:        Mood and Affect: Mood and affect normal.           Assessment & Plan:   Problem List Items Addressed This Visit      Cardiovascular and Mediastinum   Essential hypertension (Chronic)    bp in fair control at this time  BP Readings from Last 1 Encounters:  10/09/20 138/64   No changes needed Most recent labs reviewed  Disc lifstyle change with low sodium diet and exercise  Plan to continue amlodipine 10 mg daily and lasix 20 mg qd prn and hydralazine 10 mg daily        Other   Poor balance - Primary    Pt has baseline poor balance (she can not remember if she did PT for this) but now increased problems after in gabapentin to 200 mg bid for hand pain  On fall with no injuries  Suspect she is not tolerating medication  Adv she go back to the prior dose (and let pain specialist know)  Disc fall prv  Consider PT for balance/gait (she declines currently)  Did note that pt shuffles when walking (needs support for R knee)  Has a cane here/ walker may be more helpful       Neuropathic pain of hand, left    Seeing pain specialist  Not tolerating  gabapentin at recenly inc dose

## 2020-10-09 NOTE — Patient Instructions (Addendum)
Go back to gabapentin 100 mg twice daily  Let the pain office know   Let me know if you do not have improvement in balance next week  Take good care of yourself   Stay busy at home   Keep up a good fluid intake  Stand up slowly and walk slowly and use cane or walker

## 2020-10-24 ENCOUNTER — Telehealth (HOSPITAL_BASED_OUTPATIENT_CLINIC_OR_DEPARTMENT_OTHER): Payer: Medicare HMO | Admitting: Student in an Organized Health Care Education/Training Program

## 2020-10-24 DIAGNOSIS — M5412 Radiculopathy, cervical region: Secondary | ICD-10-CM

## 2020-10-30 NOTE — Progress Notes (Signed)
VV?

## 2020-12-04 ENCOUNTER — Telehealth: Payer: Self-pay

## 2020-12-04 ENCOUNTER — Ambulatory Visit: Payer: Medicare HMO | Admitting: Family Medicine

## 2020-12-04 NOTE — Telephone Encounter (Signed)
Patient had an appointment with Dr. Glori Bickers tomorrow, but had to be changed to a different day. Patient wanted to be seen for pain in her neck that radiates down her arm that she has been dealing with for months. Patient also complained of feeling unsteady on her feet, which has also been an ongoing issue. Patient denies lightheadedness, SOB, chest pain, or any other acute issues. Dr. Alba Cory soonest appointment would be next Thursday. Patient was uncomfortable with waiting until then and wanted to be seen by another provider. Patient scheduled with Allie Bossier, NP on Friday (3/11) for this issue. Encouraged patient that if pain became severe or if symptoms got worse to report to UC or ED. Patient verbalized understanding.

## 2020-12-05 ENCOUNTER — Ambulatory Visit: Payer: Medicare HMO | Admitting: Family Medicine

## 2020-12-06 ENCOUNTER — Ambulatory Visit (INDEPENDENT_AMBULATORY_CARE_PROVIDER_SITE_OTHER): Payer: Medicare HMO | Admitting: Primary Care

## 2020-12-06 ENCOUNTER — Other Ambulatory Visit: Payer: Self-pay

## 2020-12-06 ENCOUNTER — Encounter: Payer: Self-pay | Admitting: Primary Care

## 2020-12-06 VITALS — BP 138/72 | HR 78 | Temp 97.4°F | Ht 60.75 in | Wt 139.0 lb

## 2020-12-06 DIAGNOSIS — M5412 Radiculopathy, cervical region: Secondary | ICD-10-CM | POA: Diagnosis not present

## 2020-12-06 DIAGNOSIS — M4802 Spinal stenosis, cervical region: Secondary | ICD-10-CM

## 2020-12-06 MED ORDER — PREDNISONE 20 MG PO TABS: ORAL_TABLET | ORAL | 0 refills | Status: DC

## 2020-12-06 MED ORDER — GABAPENTIN 100 MG PO CAPS: 100.0000 mg | ORAL_CAPSULE | Freq: Three times a day (TID) | ORAL | 0 refills | Status: DC

## 2020-12-06 NOTE — Progress Notes (Signed)
Subjective:    Patient ID: Linda Robinson, female    DOB: [birth year], 87 y.o.   MRN: [id number]  HPI  Linda Robinson is a very pleasant 87 y.o. female patient of Dr. Glori Bickers with a history of hypertension, low back pain, osteoarthritis, cervical spondylosis with cervical fusion syndrome, renal insufficiency, cervical radiculopathy with neuropathic pain of left upper extremity, chronic pain syndrome who presents today with a chief complaint of neck pain.  Her pain is located to the left lateral neck with radiation down to her left upper extremity, chronic for years, worse over last several weeks. Pain is worse with movement, improves with resting her head to the right side on her hand. She doesn't believe the last neck surgery was successful, feels worse now then she did prior to her last surgery.   History of cervical spinal fusion for "bone spur" one year ago, has had several fusions to the cervical spine previously. She called her surgen recently, could not get until May 2022 but she doesn't want to see her prior surgeon as she's not improved.  She is managed on gabapentin 100 mg twice daily, cannot tolerate 200 mg in one dose. This hasn't helped, has not tired 100 mg TID. The gabapentin does not cause drowsiness.    Review of Systems  Musculoskeletal: Positive for arthralgias and neck pain.  Skin: Negative for color change.  Neurological: Positive for weakness and numbness.         Past Medical History:  Diagnosis Date  . Ankylosis of lumbar spine   . Basal cell carcinoma    removed  . Cervical spine pain   . Chest pain    a. 02/2017 MV: EF 54%, small, distal anterior wall/apical infacrt (likely misregistration), no ischemia-->Low risk; b. 02/2017 Echo: EF 55-60%, mild MR, PASP 34mmHg.  Marland Kitchen Chronic back pain    stenosis  . Constipation    takes Colace daily  . Depression    takes Zoloft daily  . Diverticulosis of colon   . GERD (gastroesophageal reflux disease)    atypical  chest pain  . Gout    hx of;was on Allopurinol but taken off 6 wks ago by medical MD  . History of diverticulitis of colon   . HLD (hyperlipidemia)    takes Pravastatin daily  . HTN (hypertension)    takes Amlodipine and carvedilol  . Lumbar spondylosis with myelopathy   . Numbness   . OA (osteoarthritis)    right knee  . Osteopenia    takes Calcium and Vit D daily  . Scoliosis   . Vertigo    takes Antivert daily as needed    Social History   Socioeconomic History  . Marital status: Widowed    Spouse name: Not on file  . Number of children: 3  . Years of education: 59  . Highest education level: Associate degree: occupational, Hotel manager, or vocational program  Occupational History  . Occupation: Retired  Tobacco Use  . Smoking status: Never Smoker  . Smokeless tobacco: Never Used  Vaping Use  . Vaping Use: Never used  Substance and Sexual Activity  . Alcohol use: Never    Alcohol/week: 0.0 standard drinks  . Drug use: Never  . Sexual activity: Not Currently    Birth control/protection: Post-menopausal  Other Topics Concern  . Not on file  Social History Narrative   Lives alone   Coffee 1/2 c   Social Determinants of Health   Financial Resource Strain: Not on  file  Food Insecurity: Not on file  Transportation Needs: Not on file  Physical Activity: Not on file  Stress: Not on file  Social Connections: Not on file  Intimate Partner Violence: Not on file    Past Surgical History:  Procedure Laterality Date  . APPENDECTOMY  at age 70  . BACK SURGERY    . CATARACT EXTRACTION Right 04/2006  . CHOLECYSTECTOMY  1970  . COLONOSCOPY    . KNEE SURGERY Right    Medial meniscus tear  . LUMBAR LAMINECTOMY/DECOMPRESSION MICRODISCECTOMY Bilateral 11/13/2014   Procedure: Lumbar one-two Bilateral laminectomy and foraminotomy, Left Lumbar one-two microdiscectomy ;  Surgeon: Faythe Ghee, MD;  Location: [facility];  Service: Neurosurgery;  Laterality: Bilateral;  .  NECK SURGERY  1990-2021   x 3; fusions  . PARTIAL COLECTOMY  09/1999   d/t diverticulitis  . TUBAL LIGATION      Family History  Problem Relation Age of Onset  . Cancer Father        throat and bladder  . Diabetes Father   . Hypertension Father   . Heart attack Father   . Stroke Mother   . Alzheimer's disease Mother   . Hypertension Mother     Allergies  Allergen Reactions  . Ace Inhibitors     REACTION: increased K+, increased creat.  . Bactrim [Sulfamethoxazole-Trimethoprim]     rash  . Cephalosporins     REACTION: reaction not known  . Colesevelam     REACTION: constipation  . Hctz [Hydrochlorothiazide]     Makes her feel "bad"  . Losartan     Increased K  . Lovastatin     REACTION: doesn't work  . Raloxifene     REACTION: cramps  . Rosuvastatin     REACTION: myalgia  . Simvastatin     REACTION: myalgia    Current Outpatient Medications on File Prior to Visit  Medication Sig Dispense Refill  . amLODipine (NORVASC) 10 MG tablet Take 1 tablet (10 mg total) by mouth daily. 90 tablet 3  . aspirin EC 81 MG tablet Take 1 tablet (81 mg total) by mouth daily. 90 tablet 3  . Calcium Carbonate-Vitamin D 500-125 MG-UNIT TABS Take by mouth.    . Cranberry 250 MG CAPS Take 1 capsule by mouth daily.    Marland Kitchen docusate sodium (COLACE) 100 MG capsule Take 1-2 capsules (100-200 mg total) by mouth 2 (two) times daily. 100 mg in the morning and 200 mg at night 180 capsule 1  . famotidine (PEPCID) 20 MG tablet Take 1 tablet (20 mg total) by mouth 2 (two) times daily. 180 tablet 3  . fish oil-omega-3 fatty acids 1000 MG capsule Take 1 g by mouth at bedtime.    . furosemide (LASIX) 20 MG tablet TAKE 1 TABLET DAILY AS NEEDED FOR SWELLING 90 tablet 1  . gabapentin (NEURONTIN) 100 MG capsule Take 2 capsules (200 mg total) by mouth 2 (two) times daily as needed. (Patient taking differently: Take 100 mg by mouth 2 (two) times daily as needed.) 120 capsule 1  . hydrALAZINE (APRESOLINE) 10 MG  tablet Take 1 tablet (10 mg total) by mouth daily. 90 tablet 3  . ibuprofen (ADVIL) 800 MG tablet Take 800 mg by mouth daily as needed.    . meclizine (ANTIVERT) 25 MG tablet Take 25 mg by mouth 4 (four) times daily as needed for dizziness.    Marland Kitchen omeprazole (PRILOSEC) 20 MG capsule Take 1 capsule (20 mg total) by  mouth daily. 90 capsule 3  . pravastatin (PRAVACHOL) 20 MG tablet Take 1 tablet (20 mg total) by mouth daily. 90 tablet 3  . sertraline (ZOLOFT) 100 MG tablet Take 1 tablet (100 mg total) by mouth daily. 90 tablet 3  . Tetrahydrozoline HCl (EYE DROPS OP) Apply 1-2 drops to eye as needed (for dry eye).     No current facility-administered medications on file prior to visit.    BP 138/72   Pulse 78   Temp (!) 97.4 F (36.3 C) (Temporal)   Ht 5' 0.75" (1.543 m)   Wt 139 lb (63 kg)   SpO2 98%   BMI 26.48 kg/m  Objective:   Physical Exam Neck:      Comments: Decrease in ROM with pain to left cervical rotation, extension. Appears uncomfortable today, leaning her head onto her right hand during most of visit. Pulmonary:     Effort: Pulmonary effort is normal.  Musculoskeletal:     Cervical back: Pain with movement present. No spinous process tenderness. Decreased range of motion.  Neurological:     Mental Status: She is alert.           Assessment & Plan:      This visit occurred during the SARS-CoV-2 public health emergency.  Safety protocols were in place, including screening questions prior to the visit, additional usage of staff PPE, and extensive cleaning of exam room while observing appropriate contact time as indicated for disinfecting solutions.

## 2020-12-06 NOTE — Patient Instructions (Signed)
Start prednisone tablets. Take two tablets my mouth once daily for four days, then one tablet once daily for four days.   We increased your gabapentin, take 1 capsule by mouth three times daily for neck pain.  Please schedule a follow up visit with your surgeon as discussed.  It was a pleasure meeting you!

## 2020-12-06 NOTE — Assessment & Plan Note (Signed)
Chronic, no improvement since surgery one year ago. Exam today without alarm signs, she does appear uncomfortable.  Recommended she schedule a follow up visit with PCP to come up with a plan for ongoing/chronic neck pain.  Increase gabapentin to 100 mg TID, gabapentin does not cause drowsiness during the day.  Will provide short course of oral steroids for relief today. Rx for prednisone course sent to pharmacy.

## 2020-12-09 ENCOUNTER — Telehealth: Payer: Self-pay

## 2020-12-09 NOTE — Telephone Encounter (Signed)
Spoke with patient who stated that her Gabapentin was sent to the wrong pharmacy. She stated that she wanted it sent to [facility]. Instructed patient that is where it was sent on 3/11. Patient verbalized understanding.

## 2020-12-09 NOTE — Telephone Encounter (Signed)
[facility] Night - Client Nonclinical Telephone Record  [facility] Client [facility] Night - Client Client Site [place] Physician Alma Friendly - NP Contact Type Call Who Is Calling Patient / Member / Family / Caregiver Caller Name Rip[place] Phone Number [phone] Patient Name Linda Robinson Patient DOB 1928-10-15 Call Type Message Only Information Provided Reason for Call Request for General Office Information Initial Comment Caller states her rx order was [facility] of mail order company. Needs it resent on Monday. Disp. Time Disposition Final User 12/07/2020 9:18:20 AM General Information Provided Yes Kelby Aline Call Closed By: Kelby Aline Transaction Date/Time: 12/07/2020 9:16:04 AM (ET)

## 2020-12-12 ENCOUNTER — Ambulatory Visit: Payer: Medicare HMO | Admitting: Family Medicine

## 2020-12-13 ENCOUNTER — Ambulatory Visit: Payer: Medicare HMO | Admitting: Family Medicine

## 2020-12-14 NOTE — Progress Notes (Signed)
Linda Saks T. Antaniya Venuti, MD, [place] at [facility] [place], [address]  Phone: [phone]   FAX: [place] - 87 y.o. female   MRN [id number]   Date of Birth: 08/31/1929  Date: 12/16/2020   PCP: Abner Greenspan, MD   Referral: Abner Greenspan, MD  Chief Complaint  Patient presents with   Neck Pain   Arm Pain    Left    This visit occurred during the SARS-CoV-2 public health emergency.  Safety protocols were in place, including screening questions prior to the visit, additional usage of staff PPE, and extensive cleaning of exam room while observing appropriate contact time as indicated for disinfecting solutions.   Subjective:   Linda Robinson is a 87 y.o. very pleasant female patient with Body mass index is 26.15 kg/m. who presents with the following:  I recall this very pleasant patient doing quite poorly.  She has failed neck syndrome.  She is here in consultation. Clinical history is well detailed below.  Complex surgical case with recalcitrant pain management with failed neck syndrome and multiple complicated neck surgeries in the past.  I have seen her a number of times recently, and I did recommend that she follow-up with her spine surgeon a few different times.  Looks as if the patient actually had a pain management appointment 27, 2022.  This is a [place] [facility].  Has an appointment with Dr. Patrice Paradise, but this is not until 01/2021.  She is now on low dose gabapentin with no relief of symptoms.   07/17/2020 Last OV with Owens Loffler, MD She had a very complicated cervical spine surgery done by Dr. Patrice Paradise 11/2019, and she has had other cervical spine surgeries starting in the 1990's.  She had a cervical fusion from C4-C7 with instrumentation at c6-7 and a c7-t1 revision. She had some hand weakness when I fist saw her.   She can to see  me 02/2020 and 04/2020, and I recommended follow-up with her spine surgeon.  At that time, she was having muscular loss of the hand and radicular neck pain as well as left handed weakness.   She was to have EMG/NCV set up from her spine surgeons office.  She has also seen Dr. Leta Baptist 05/20/2020 who did not think EMG/NCV would be all that helpful.   Dr. Patrice Paradise did a CT myelogram less than 1 month ago. The result is included below.  On chart review from 06/12/2020, the patient has not seen Dr. Patrice Paradise per report. The last records in her chart are from 11/15/2019.   Saw the PA again at follow-up, but she reports that she has not seen Dr. Patrice Paradise.  She is very distraught and upset about her ongoing continuous and severe hand pain with decreased functionality in her grip as well as in her wrist function and she has left hand dominant.   She did have a covid test.   She has done some OT for several months.   CLINICAL DATA:  Left hand pain, cervical radiculopathy, previous fusion surgery   EXAM: CERVICAL MYELOGRAM   CT CERVICAL SPINE WITH INTRATHECAL CONTRAST   TECHNIQUE: The procedure, risks (including but not limited to bleeding, infection, organ damage ), benefits, and alternatives were explained to the patient. Questions regarding the procedure were encouraged and answered. The patient understands and consents to the procedure. An appropriate entry site  was determined under fluoroscopy. Operator donned sterile gloves and mask. Skin site was marked,prepped with Betadine, and draped in usual sterile fashion, and infiltrated locally with 1% lidocaine. A 22 gauge spinal needle was advanced into the thecal sac at L3 from a right parasagittal approach. Clear colorless CSF returned. 10 ml Omnipaque 300 were administered intrathecally for cervical myelography, followed by axial CT scanning of the cervical spine. I personally performed the lumbar puncture and administered the intrathecal contrast. I  also personally supervised acquisition of the myelogram images. Coronal and sagittal reconstructions were generated.   FINDINGS: On myelography, there was initially apparent myelographic blockage at the thoracolumbar junction with little contrast passing into the thoracic and cervical region on fluoroscopy. On initial CT, there was poor opacification of the CSF at in the cervical region. However, limited CT through the thoracolumbar junction demonstrated good intrathecal opacification without evidence of mixed injection. With further patient manipulation, improved opacification of the cervical region was achieved.   Normal alignment.  Negative for fracture or focal bone lesion.   C2-3: Mild narrowing of the interspace. Mild circumferential disc bulge. Right uncovertebral spurs minimally encroach upon the neural foramen. Central canal patent.   C3-4: Marked narrowing of the interspace with broad posterior disc bulge and associated endplate spurs. Mild spinal stenosis without cord distortion. Mild encroachment upon bilateral neural foramina by uncovertebral and facet spurs.   C4-5: Solid-appearing interbody fusion. Central canal and foramina patent.   C5-6: Solid-appearing interbody fusion. Central canal and foramina patent.   C6-7: Solid-appearing interbody fusion. Right paracentral residual disc contacts and slightly distorts the anterior aspect of the cord. Foramina patent.   C7-T1: Bilateral cervical ribs at C7. Changes of instrumented ACDF with solid-appearing fusion across the interspace. Mild residual disc bulge which approaches but does not distort the cervical cord. Left foraminal encroachment secondary to uncovertebral and facet spurs.   T1-T2: Broad midline bulge minimally distorts the anterior aspect of the cervical cord. No spinal stenosis. Foramina patent.   Bilateral carotid bifurcation calcified plaque. Regional soft tissues otherwise unremarkable.    IMPRESSION: 1. Solid-appearing interbody fusion C4-T1. 2. Adjacent level disease C3-4 with mild spinal stenosis and foraminal encroachment. 3. Broad midline bulge T1-T2 minimally distorts the anterior aspect of the cervical cord, without spinal stenosis. 4. Bilateral cervical ribs at C7. 5. Bilateral carotid bifurcation calcified plaque.     Electronically Signed   By: Lucrezia Europe M.D.   On: 06/17/2020 11:29   Review of Systems is noted in the HPI, as appropriate   Objective:   BP 122/60    Pulse 86    Temp 98.3 F (36.8 C) (Temporal)    Ht 5' 0.75" (1.543 m)    Wt 137 lb 4 oz (62.3 kg)    SpO2 95%    BMI 26.15 kg/m   60 to 70% global loss of motion at the neck. Tender with all terminal motion. Spinous processes are nontender. Diffuse tenderness throughout the entirety of the supportive structure of the neck as well as in the upper traps.  Range of motion at the shoulder is full with 4+/5 in all directions. Strength is 4+/5 in flexion and extension at the elbow.  Grip strength on the left is diffusely poor, and she has notable severe wasting of musculature of the hand and forearm on the left.   She also has decreased extension and flexion at the wrist to 4 -/5   Her pulses are 2+   Radiology: CT myelogram results are as  above.  Assessment and Plan:     ICD-10-CM   1. 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 Cervical stenosis of spinal canal  M48.02   3. Neuropathic pain of hand, left  M79.2   4. Left cervical radiculopathy  M54.12   5. Atrophy of muscle of left forearm  M62.532    Total encounter time: 40 minutes. This includes total time spent on the day of encounter.  Complicated case, I will speak with the patient's primary care provider.  Recommendations: She understands that there is some risk and dosing NSAIDs as well as Tylenol and any form of neuropathic or opioid pain medication in the sense that there could be renal or liver toxicity as well as increased risk  of falling and potential death.  Her most recent GFR was 50.  Liver enzymes have been normal.  She accepts those risks.  At this point she is severely impaired due to pain and her quality of life is quite poor.    1.  Maximum strength Tylenol scheduled dosing. 2.  Maximal strength ibuprofen dosing 3 times a day. 3.  Recommend trial of Lyrica as opposed to gabapentin, titrate dose if at all possible. 4.  SNRI versus TCA would also likely be of benefit.  Titration off of Zoloft to SNRI might be appropriate.  I would like to get Dr. Glori Bickers involved with this decision making process. 5.  Consideration of opioid analgesics in this case with severely impaired patient who has in my opinion has a markedly impaired quality of life.  This follows American Geriatric Society recommendation and guidelines.  She has tried tramadol, and this has been of no benefit.  I think that the potential benefit of opioids would be greater than the potential risk in this patient, albeit there is notable significant risk.  Mrs. Kaus is in severe, recalcitrant pain that is ruining her quality of life. 6.  Physical therapy has been of no benefit. 7.  ESI have been of no benefit 8.  She has also seen in addition to Dr. Patrice Paradise, Dr. Hal Neer, Dr. Payton Mccallum, and Dr. Ron Agee.  She very recently has seen Dr. Holley Raring from [facility].  This is not a Sports Medicine case.  Ideally, Pain Management and her primary care doctor can help coordinate long-term pain regiment.    I am going to send a copy of this note to Dr. Glori Bickers.  Patient Instructions  Take Tylenol/Acetaminophen ES (500mg ) 2 tabs by mouth three times a day. Take it every day.  Ibuprofen 800 mg recommended three times a day. (Over the counter Motrin, Advil, or Generic Ibuprofen 200 mg tablets. 4 tablets by mouth 3 times a day. This equals a prescription strength dose.)    STOP your gabapentin  Start the new Lyrica 75 mg capsules twice a day. If you feel drowsy when you  start it, then only take it at night.    Meds ordered this encounter  Medications   pregabalin (LYRICA) 75 MG capsule    Sig: Take 1 capsule (75 mg total) by mouth 2 (two) times daily.    Dispense:  60 capsule    Refill:  3   Medications Discontinued During This Encounter  Medication Reason   gabapentin (NEURONTIN) 814 MG capsule Duplicate   predniSONE (DELTASONE) 20 MG tablet Completed Course   gabapentin (NEURONTIN) 481 MG capsule Duplicate   gabapentin (NEURONTIN) 100 MG capsule     Signed,  Panda Crossin T. Keylen Uzelac, MD   Outpatient Encounter  Medications as of 12/16/2020  Medication Sig   amLODipine (NORVASC) 10 MG tablet Take 1 tablet (10 mg total) by mouth daily.   aspirin EC 81 MG tablet Take 1 tablet (81 mg total) by mouth daily.   Calcium Carbonate-Vitamin D 500-125 MG-UNIT TABS Take by mouth.   Cranberry 250 MG CAPS Take 1 capsule by mouth daily.   docusate sodium (COLACE) 100 MG capsule Take 1-2 capsules (100-200 mg total) by mouth 2 (two) times daily. 100 mg in the morning and 200 mg at night   famotidine (PEPCID) 20 MG tablet Take 1 tablet (20 mg total) by mouth 2 (two) times daily.   fish oil-omega-3 fatty acids 1000 MG capsule Take 1 g by mouth at bedtime.   furosemide (LASIX) 20 MG tablet TAKE 1 TABLET DAILY AS NEEDED FOR SWELLING   hydrALAZINE (APRESOLINE) 10 MG tablet Take 1 tablet (10 mg total) by mouth daily.   ibuprofen (ADVIL) 800 MG tablet Take 800 mg by mouth daily as needed.   meclizine (ANTIVERT) 25 MG tablet Take 25 mg by mouth 4 (four) times daily as needed for dizziness.   omeprazole (PRILOSEC) 20 MG capsule Take 1 capsule (20 mg total) by mouth daily.   pravastatin (PRAVACHOL) 20 MG tablet Take 1 tablet (20 mg total) by mouth daily.   pregabalin (LYRICA) 75 MG capsule Take 1 capsule (75 mg total) by mouth 2 (two) times daily.   sertraline (ZOLOFT) 100 MG tablet Take 1 tablet (100 mg total) by mouth daily.   Tetrahydrozoline HCl (EYE  DROPS OP) Apply 1-2 drops to eye as needed (for dry eye).   [DISCONTINUED] gabapentin (NEURONTIN) 100 MG capsule Take 100 mg by mouth 2 (two) times daily.   [DISCONTINUED] gabapentin (NEURONTIN) 100 MG capsule Take 2 capsules (200 mg total) by mouth 2 (two) times daily as needed. (Patient taking differently: Take 100 mg by mouth 2 (two) times daily as needed.)   [DISCONTINUED] gabapentin (NEURONTIN) 100 MG capsule Take 1 capsule (100 mg total) by mouth 3 (three) times daily. For neck pain.   [DISCONTINUED] predniSONE (DELTASONE) 20 MG tablet Take two tablets my mouth once daily for four days, then one tablet once daily for four days.   No facility-administered encounter medications on file as of 12/16/2020.

## 2020-12-16 ENCOUNTER — Ambulatory Visit (INDEPENDENT_AMBULATORY_CARE_PROVIDER_SITE_OTHER): Payer: Medicare HMO | Admitting: Family Medicine

## 2020-12-16 ENCOUNTER — Other Ambulatory Visit: Payer: Self-pay

## 2020-12-16 ENCOUNTER — Encounter: Payer: Self-pay | Admitting: Family Medicine

## 2020-12-16 VITALS — BP 122/60 | HR 86 | Temp 98.3°F | Ht 60.75 in | Wt 137.2 lb

## 2020-12-16 DIAGNOSIS — M961 Postlaminectomy syndrome, not elsewhere classified: Secondary | ICD-10-CM

## 2020-12-16 DIAGNOSIS — M4802 Spinal stenosis, cervical region: Secondary | ICD-10-CM | POA: Diagnosis not present

## 2020-12-16 DIAGNOSIS — M62532 Muscle wasting and atrophy, not elsewhere classified, left forearm: Secondary | ICD-10-CM

## 2020-12-16 DIAGNOSIS — M792 Neuralgia and neuritis, unspecified: Secondary | ICD-10-CM

## 2020-12-16 DIAGNOSIS — M5412 Radiculopathy, cervical region: Secondary | ICD-10-CM

## 2020-12-16 MED ORDER — PREGABALIN 75 MG PO CAPS: 75.0000 mg | ORAL_CAPSULE | Freq: Two times a day (BID) | ORAL | 3 refills | Status: DC

## 2020-12-16 NOTE — Patient Instructions (Addendum)
Take Tylenol/Acetaminophen ES (500mg ) 2 tabs by mouth three times a day. Take it every day.  Ibuprofen 800 mg recommended three times a day. (Over the counter Motrin, Advil, or Generic Ibuprofen 200 mg tablets. 4 tablets by mouth 3 times a day. This equals a prescription strength dose.)    STOP your gabapentin  Start the new Lyrica 75 mg capsules twice a day. If you feel drowsy when you start it, then only take it at night.

## 2020-12-17 ENCOUNTER — Telehealth: Payer: Self-pay | Admitting: Family Medicine

## 2020-12-17 ENCOUNTER — Telehealth: Payer: Self-pay | Admitting: *Deleted

## 2020-12-17 MED ORDER — PREGABALIN 75 MG PO CAPS: 75.0000 mg | ORAL_CAPSULE | Freq: Two times a day (BID) | ORAL | 3 refills | Status: DC

## 2020-12-17 NOTE — Telephone Encounter (Signed)
Rx printed and will have Dr. Lorelei Pont sign it on Wednesday upon his return to clinic.

## 2020-12-17 NOTE — Telephone Encounter (Signed)
Received fax requesting PA for Lyrica 75 mg.  PA completed on CoverMyMeds and sent for review.  Can take up to 72 hours for a decision.

## 2020-12-17 NOTE — Telephone Encounter (Signed)
Pt called in wanted to know if they can pick up the script for lyrica due to [facility] and [facility] and her son knows a place that he can get it cheaper and wanted to pick up the script

## 2020-12-18 ENCOUNTER — Other Ambulatory Visit: Payer: Self-pay | Admitting: *Deleted

## 2020-12-18 MED ORDER — FUROSEMIDE 20 MG PO TABS: ORAL_TABLET | ORAL | 1 refills | Status: DC

## 2020-12-18 NOTE — Telephone Encounter (Signed)
Ms. Probert notified that prescription is ready to be picked up at the front desk.

## 2020-12-19 ENCOUNTER — Telehealth: Payer: Self-pay

## 2020-12-19 NOTE — Telephone Encounter (Signed)
Linda Robinson notified as instructed by telephone.  Patient states understanding.

## 2020-12-19 NOTE — Telephone Encounter (Signed)
Please call  She needs to stop her Lyrica entirely.  She can restart her gabapentin back tomorrow.

## 2020-12-19 NOTE — Telephone Encounter (Signed)
Started taking Lyrica yesterday. Woke up this morning and said she could barely walk and was seeing double even with her glasses on. She did not take the Lyrica this morning. Thinks the Lyrica could be causing this issue. Please advise what to do.

## 2020-12-26 ENCOUNTER — Telehealth: Payer: Self-pay | Admitting: *Deleted

## 2020-12-26 NOTE — Telephone Encounter (Signed)
-----   Message from Abner Greenspan, MD sent at 12/25/2020  9:06 AM EDT ----- Please let pt know that I am back in the office and I reviewed her visit with Dr Lorelei Pont.  He thinks trying a different pain clinic may be helpful (he likes [facility] in [place])  would she be open to that?   Thanks  ----- Message ----- From: Owens Loffler, MD Sent: 12/17/2020   2:00 PM EDT To: Abner Greenspan, MD  [facility],   Please read my recommendations.  She is really doing very poorly.  I tried to make some immediate changes that will hopefully help.  I think a follow-up CMP in a month or so would be a good idea.  She recently saw pain management, and they recommended gabapentin 100 mg alone.  I would probably get a second opinion.  Her quality of life is terrible now.  I think talking to you in follow-up globally would help non-urgently as you are able.

## 2020-12-26 NOTE — Telephone Encounter (Signed)
Pt said that the meds Dr. Lorelei Pont put her on have helped. She has little pain and it's manageable. Pt said Dr. Lorelei Pont has her on ibuprofen and tylenol TID and since putting her on these meds TID she can tell a big difference. Pt said she is okay just taking meds Dr. Lorelei Pont recommended/ prescribed but if PCP thinks she still needs go to another pain clinic then let her know but she is okay for now and thanked PCP and Dr. Lorelei Pont for taking good care of her

## 2020-12-26 NOTE — Telephone Encounter (Signed)
That sounds great, glad to hear it

## 2021-02-03 ENCOUNTER — Other Ambulatory Visit: Payer: Self-pay

## 2021-02-03 ENCOUNTER — Ambulatory Visit (INDEPENDENT_AMBULATORY_CARE_PROVIDER_SITE_OTHER): Payer: Medicare HMO | Admitting: Family Medicine

## 2021-02-03 ENCOUNTER — Encounter: Payer: Self-pay | Admitting: Family Medicine

## 2021-02-03 VITALS — BP 126/70 | HR 93 | Temp 98.4°F | Ht 60.75 in | Wt 139.0 lb

## 2021-02-03 DIAGNOSIS — I10 Essential (primary) hypertension: Secondary | ICD-10-CM

## 2021-02-03 MED ORDER — CLONIDINE 0.1 MG/24HR TD PTWK: 0.1000 mg | MEDICATED_PATCH | TRANSDERMAL | 1 refills | Status: DC

## 2021-02-03 NOTE — Patient Instructions (Signed)
Check your blood pressure first thing in the morning and in the afternoon before dinner.   Write those numbers down, and follow-up with Dr. Glori Bickers in 3-4 weeks.

## 2021-02-03 NOTE — Progress Notes (Signed)
[name], MD, [place] at [facility] [place], [address]  Phone: [phone]  FAX: [place] - 87 y.o. female  MRN [id number]  Date of Birth: [birth year]  Date: 02/03/2021  PCP: Abner Greenspan, MD  Referral: Abner Greenspan, MD  Chief Complaint  Patient presents with  . Hypertension    This visit occurred during the SARS-CoV-2 public health emergency.  Safety protocols were in place, including screening questions prior to the visit, additional usage of staff PPE, and extensive cleaning of exam room while observing appropriate contact time as indicated for disinfecting solutions.   Subjective:   Linda Robinson is a 87 y.o. very pleasant female patient with Body mass index is 26.48 kg/m. who presents with the following:  BP has been elevated at home despite 3 meds.   As high as 546'E systolic on her home cuff.  179/82 - last night  She takes Norvasc 10 mg, Lasix 20 mg, Hydralazine 10 mg - only in the AM  125/72 by my read in the office.   Has been shaky and jittery with her BP being up.  Did not tolerate thiazides, ace, or arb in the past.  Review of Systems is noted in the HPI, as appropriate  Objective:   BP 126/70   Pulse 93   Temp 98.4 F (36.9 C) (Temporal)   Ht 5' 0.75" (1.543 m)   Wt 139 lb (63 kg)   SpO2 96%   BMI 26.48 kg/m   GEN: No acute distress; alert,appropriate. PULM: Breathing comfortably in no respiratory distress PSYCH: Normally interactive.  CV: RRR, no m/g/r   Laboratory and Imaging Data:  Assessment and Plan:     ICD-10-CM   1. Essential hypertension  I10    Acute on chronic hypertension with exacerbation.  My suspicion is that the short half-life of hydralazine is wearing off very quickly, and she is having some rebound hypertension.  Discontinue hydralazine and start Catapres patch.  Follow-up with  primary care doctor in 3 to 4 weeks.  Meds ordered this encounter  Medications  . cloNIDine (CATAPRES - DOSED IN MG/24 HR) 0.1 mg/24hr patch    Sig: Place 1 patch (0.1 mg total) onto the skin once a week.    Dispense:  4 patch    Refill:  1   Medications Discontinued During This Encounter  Medication Reason  . hydrALAZINE (APRESOLINE) 10 MG tablet    No orders of the defined types were placed in this encounter.   Follow-up: Return in about 3 weeks (around 02/24/2021) for blood pressure follow-up with Dr. Glori Bickers.  Signed,  Maud Deed. Garey Alleva, MD   Outpatient Encounter Medications as of 02/03/2021  Medication Sig  . amLODipine (NORVASC) 10 MG tablet Take 1 tablet (10 mg total) by mouth daily.  Marland Kitchen aspirin EC 81 MG tablet Take 1 tablet (81 mg total) by mouth daily.  . Calcium Carbonate-Vitamin D 500-125 MG-UNIT TABS Take by mouth.  . cloNIDine (CATAPRES - DOSED IN MG/24 HR) 0.1 mg/24hr patch Place 1 patch (0.1 mg total) onto the skin once a week.  . Cranberry 250 MG CAPS Take 1 capsule by mouth daily.  Marland Kitchen docusate sodium (COLACE) 100 MG capsule Take 1-2 capsules (100-200 mg total) by mouth 2 (two) times daily. 100 mg in the morning and 200 mg at night  . famotidine (PEPCID) 20 MG tablet Take  1 tablet (20 mg total) by mouth 2 (two) times daily.  . fish oil-omega-3 fatty acids 1000 MG capsule Take 1 g by mouth at bedtime.  . furosemide (LASIX) 20 MG tablet TAKE 1 TABLET DAILY AS NEEDED FOR SWELLING  . ibuprofen (ADVIL) 800 MG tablet Take 800 mg by mouth daily as needed.  . meclizine (ANTIVERT) 25 MG tablet Take 25 mg by mouth 4 (four) times daily as needed for dizziness.  Marland Kitchen omeprazole (PRILOSEC) 20 MG capsule Take 1 capsule (20 mg total) by mouth daily.  . pravastatin (PRAVACHOL) 20 MG tablet Take 1 tablet (20 mg total) by mouth daily.  . pregabalin (LYRICA) 75 MG capsule Take 1 capsule (75 mg total) by mouth 2 (two) times daily.  . sertraline (ZOLOFT) 100 MG tablet Take 1 tablet (100 mg  total) by mouth daily.  . Tetrahydrozoline HCl (EYE DROPS OP) Apply 1-2 drops to eye as needed (for dry eye).  . [DISCONTINUED] hydrALAZINE (APRESOLINE) 10 MG tablet Take 1 tablet (10 mg total) by mouth daily.   No facility-administered encounter medications on file as of 02/03/2021.

## 2021-02-04 ENCOUNTER — Telehealth: Payer: Self-pay

## 2021-02-04 NOTE — Telephone Encounter (Signed)
Pt notified of Dr. Tower's comments  

## 2021-02-04 NOTE — Telephone Encounter (Signed)
Pt said she was seen on 02/03/21 by Dr Lorelei Pont; pt stopped the hydralazine and started putting clonidine patch on 02/03/21. Pt said this morning pt used her BP cuff that is a little old at 8:30 AM BP 196/80 pt waited until 10:15 and recked BP and it was 190 something. Pt's son brought another BP cuff to pt and at 10:30 with pts sons BP cuff BP was 155/86 P 66. Pt has FU appt to see DR Tower on 03/04/21 at 10:30 to ck BP. Until that appt pt will ck her BP with sons BP cuff; pt is going to bring her cuff and sons cuff to 03/04/21 appt to see if home BP cuffs are accurate. Pt has no H/A,dizziness, C P or SOB. UC & ED precautions given and pt voiced understanding. Sending note to Dr Glori Bickers as PCP and pt has upcoming FU appt with her.

## 2021-02-04 NOTE — Telephone Encounter (Signed)
Watch with her son's cuff and give it a few days to stabilize, please let me know thurs or fri how bp is or earlier if there is concern, thanks

## 2021-02-05 MED ORDER — HYDRALAZINE HCL 10 MG PO TABS: 10.0000 mg | ORAL_TABLET | Freq: Every day | ORAL | 0 refills | Status: DC

## 2021-02-05 NOTE — Telephone Encounter (Signed)
When she saw Dr Lorelei Pont recently = her hydralazine 10 mg was d/c and started clonidine patch  I want her to continue the clonidine patch and add back the hydralazine (will add back to med list)  Let us know in the next several days how bp is

## 2021-02-05 NOTE — Telephone Encounter (Signed)
Pt left v/m that she was to cb with elevated BP readings; 02/05/21 before breakfast or taking meds with her son's BP cuff BP was 193/106 P70; pt recked BP183/95 P 68; pt also took BP with her BP cuff BP 196/81 P 67. Pt wants to know what she should do. I spoke with pt; pt took her med about one hr ago and now with son's BP cuff BP is 178/82 P 73. Pt said no H/A,dizziness,CP or SOB. Pt said she basically feels her normal self but she is scared because BP is so elevated. Pt said she is just resting today. UC & ED precautions given and pt voiced understanding and will wait for cb after Dr Glori Bickers reviews this note.

## 2021-02-05 NOTE — Addendum Note (Signed)
Addended by: Loura Pardon A on: 02/05/2021 09:30 AM   Modules accepted: Orders

## 2021-02-05 NOTE — Telephone Encounter (Signed)
Pt notified of Dr. Marliss Coots instructions. Pt will restart the hydralazine and keep [place] posted

## 2021-02-06 NOTE — Telephone Encounter (Signed)
Pt took meds this morning at 7:30. BP now is 162/90 P 72. No H/A,dizziness, CP or SOB. Pt still feels weak but no worse than usual. Sending note to Dr Glori Bickers for review. Pt said if her condition changes or worsens pt will cb. UC & ED precautions given and pt voiced understanding.

## 2021-02-06 NOTE — Telephone Encounter (Signed)
Please ask her to start taking the hydralazine three times daily (instead of daily) -may need to send some in , and give this a few more days to start lowering  Continue the patch and other medications  Update me tomorrow please

## 2021-02-06 NOTE — Telephone Encounter (Signed)
Pt notified of Dr. Marliss Coots comments and verbalized understanding. She will take med TID and update [place] tomorrow

## 2021-02-06 NOTE — Telephone Encounter (Signed)
[place] Night - Client TELEPHONE ADVICE RECORD [facility] Patient Name: Linda Robinson [facility] Gender: Female DOB: 03/08/1929 Age: 87 Y 11 M 19 D Return Phone Number: [phone] (Primary) Address: [place] [address] Client [facility] Night - Client Client Site [facility], Roque Lias - MD Contact Type Call Who Is Calling Patient / Member / Family / Caregiver Call Type Triage / Clinical Relationship To Patient Self Return Phone Number [phone] (Primary) Chief Complaint Blood Pressure High Reason for Call Symptomatic / Request for Health Information Initial Comment Caller states, on patch for high blood pressure. This morning was 193/103. This afternoon was 166/82. Should she take some more of her BP mediation. Translation No Nurse Assessment Nurse: Alinda Money, RN, Sarah Date/Time Eilene Ghazi Time): 02/05/2021 6:32:55 PM Confirm and document reason for call. If symptomatic, describe symptoms. ---Caller states this morning blood pressure was 193/106. States she called the office and spoke with the doctor. States they had taken her off of Hydralazine on Monday but was told to start it again today. States her BP tonight is 166/82. No symptoms. Does the patient have any new or worsening symptoms? ---Yes Will a triage be completed? ---Yes Related visit to physician within the last 2 weeks? ---Yes Does the PT have any chronic conditions? (i.e. diabetes, asthma, this includes High risk factors for pregnancy, etc.) ---Yes List chronic conditions. ---HTN, Is this a behavioral health or substance abuse call? ---No Guidelines Guideline Title Affirmed Question Affirmed Notes Nurse Date/Time (Eastern Time) Blood Pressure - High Systolic BP >= 130 OR Diastolic >= 865 Goins, RN, Judson Roch 02/05/2021 6:36:18 PM Disp. Time Eilene Ghazi Time) Disposition Final User 02/05/2021  6:39:04 PM SEE PCP WITHIN 3 DAYS Yes Goins, RN, Sarah PLEASE NOTE: All timestamps contained within this report are represented as [place] Standard Time. CONFIDENTIALTY NOTICE: This fax transmission is intended only for the addressee. It contains information that is legally privileged, confidential or otherwise protected from use or disclosure. If you are not the intended recipient, you are strictly prohibited from reviewing, disclosing, copying using or disseminating any of this information or taking any action in reliance on or regarding this information. If you have received this fax in error, please notify us immediately by telephone so that we can arrange for its return to [place]. Phone: [phone], Toll-Free: [phone], Fax: [phone] Page: 2 of 2 Call Id: [id number] [place] Disagree/Comply Comply Caller Understands Yes PreDisposition Call Doctor Care Advice Given Per Guideline SEE PCP WITHIN 3 DAYS: * You need to be seen within 2 or 3 days. CALL BACK IF: * Your blood pressure is over 180/110 * You become worse CARE ADVICE given per High Blood Pressure (Adult) guideline.

## 2021-02-07 NOTE — Telephone Encounter (Signed)
Pt notified of Dr. Marliss Coots instructions and comments and verbalized understanding, f/u appt scheduled for Monday and pt aware

## 2021-02-07 NOTE — Telephone Encounter (Signed)
Those are not dangerously high blood pressures (in terms of stroke risk) , just hight than I want them to be.  The anxiety may falsely elevate them more also.  I would like to see her in the office if possible next week to talk and check it again and make a plan. Thanks for the heads up  Continue medicines as you are right now

## 2021-02-07 NOTE — Telephone Encounter (Signed)
Pt was to call with update; pt increased Hydralazine to tid as instructed; pt did take x 3 on 02/06/21.  On 02/07/21 before taking meds and eating breakfast  BP was 183/94 P 74; one hr after taking Meds and eating breakfast BP was 171/83 P 79; at 10 AM BP 179/76 P 70. Now pt feels nervous; pt cannot understand why BP elevated. No pain,no CP,SOB,dizziness or H/A. Pt said she thinks one reason she is so nervous is pts husband had same problem last year except pt's husband had H/A on and off. Pt said her husband had a stroke and died. Pt is living alone now. Pt will wait for cb after Dr Glori Bickers reviews the note.

## 2021-02-10 ENCOUNTER — Other Ambulatory Visit: Payer: Self-pay

## 2021-02-10 ENCOUNTER — Encounter: Payer: Self-pay | Admitting: Family Medicine

## 2021-02-10 ENCOUNTER — Ambulatory Visit (INDEPENDENT_AMBULATORY_CARE_PROVIDER_SITE_OTHER): Payer: Medicare HMO | Admitting: Family Medicine

## 2021-02-10 VITALS — BP 128/70 | HR 81 | Temp 96.5°F | Ht 60.75 in | Wt 137.7 lb

## 2021-02-10 DIAGNOSIS — I10 Essential (primary) hypertension: Secondary | ICD-10-CM

## 2021-02-10 DIAGNOSIS — R69 Illness, unspecified: Secondary | ICD-10-CM | POA: Diagnosis not present

## 2021-02-10 DIAGNOSIS — F418 Other specified anxiety disorders: Secondary | ICD-10-CM

## 2021-02-10 MED ORDER — CLONIDINE 0.1 MG/24HR TD PTWK: 0.1000 mg | MEDICATED_PATCH | TRANSDERMAL | 11 refills | Status: DC

## 2021-02-10 MED ORDER — HYDRALAZINE HCL 10 MG PO TABS: 10.0000 mg | ORAL_TABLET | Freq: Three times a day (TID) | ORAL | 11 refills | Status: DC

## 2021-02-10 MED ORDER — GABAPENTIN 100 MG PO CAPS: 100.0000 mg | ORAL_CAPSULE | Freq: Two times a day (BID) | ORAL | 3 refills | Status: DC

## 2021-02-10 NOTE — Patient Instructions (Addendum)
Your blood pressure is much better!  Looking good today  Your blood pressure cuff runs a little high so keep that in mind   I sent gabapentin to the pharmacy  I printed the clonidine and hydralazine px   Take care of yourself  We will see you back in June

## 2021-02-10 NOTE — Progress Notes (Signed)
Subjective:    Patient ID: Linda Robinson, female    DOB: 1929/07/15, 87 y.o.   MRN: [id number]  This visit occurred during the SARS-CoV-2 public health emergency.  Safety protocols were in place, including screening questions prior to the visit, additional usage of staff PPE, and extensive cleaning of exam room while observing appropriate contact time as indicated for disinfecting solutions.    HPI Pt presents for elevated BP  Wt Readings from Last 3 Encounters:  02/10/21 137 lb 11.2 oz (62.5 kg)  02/03/21 139 lb (63 kg)  12/16/20 137 lb 4 oz (62.3 kg)   26.23 kg/m  BP has been elevated recently in setting of anxiety/stress  Had last seen Dr Lorelei Pont who added clonidine patch 0.1 mg/24 h  bp is stable today  No cp or palpitations or headaches or edema  No side effects to medicines  BP Readings from Last 3 Encounters:  02/10/21 128/70  02/03/21 126/70  12/16/20 122/60     Re check L arm 132/66 Amlodipine 10 mg  Hydralazine 10 mg tid  Lasix 20 mg daily   Pt checks with her cuff ([employer] brand for arm regular size) Her cuff right arm 139/66 Then 139/75  Reviewed recent notes from Dr Lorelei Pont and phone calls in detail  Her bp readings are higher  Still feels weak and shaky  Her L hand has chronic pain and is weak and stays cold   Most recently 150s/70s  Her husb passed last year She lives alone  Is anxious   Patient Active Problem List   Diagnosis Date Noted  . Cervical radicular pain (left) 09/12/2020  . Cervical disc disorder with radiculopathy of cervical region 09/12/2020  . Cervical spondylosis 09/12/2020  . Cervical fusion syndrome (C4-T1 ACDF) 09/12/2020  . Neuropathic pain of hand, left 09/12/2020  . Chronic pain syndrome 09/12/2020  . Failed neck syndrome 07/30/2020  . Left hand pain 07/30/2020  . Generalized weakness 05/01/2020  . Grief reaction 11/17/2019  . Cervical stenosis of spinal canal 11/17/2019  . Hearing loss 11/17/2019  . Poor balance  06/12/2019  . Screening mammogram, encounter for 05/23/2018  . Substernal chest pain 08/16/2017  . Atherosclerosis of aorta ([place]) 03/09/2017  . Weight loss 02/23/2017  . Routine general medical examination at a health care facility 07/05/2015  . Estrogen deficiency 07/05/2015  . Prediabetes 01/01/2015  . Lumbar spinal stenosis 11/13/2014  . Vertigo 10/05/2014  . Risk for falls 06/27/2014  . Low back pain on right side with sciatica 07/24/2013  . Edema 04/26/2013  . Encounter for Medicare annual wellness exam 04/11/2013  . GERD (gastroesophageal reflux disease)   . Osteopenia 01/30/2009  . CONSTIPATION 11/01/2007  . Depression with anxiety 02/14/2007  . Hyperlipidemia LDL goal <130 02/04/2007  . Gout 02/04/2007  . Essential hypertension 02/04/2007  . DIVERTICULOSIS, COLON 02/04/2007  . Renal insufficiency 02/04/2007  . OSTEOARTHRITIS 02/04/2007  . [place] DISEASE 02/04/2007   Past Medical History:  Diagnosis Date  . Ankylosis of lumbar spine   . Basal cell carcinoma    removed  . Cervical spine pain   . Chest pain    a. 02/2017 MV: EF 54%, small, distal anterior wall/apical infacrt (likely misregistration), no ischemia-->Low risk; b. 02/2017 Echo: EF 55-60%, mild MR, PASP 54mmHg.  Marland Kitchen Chronic back pain    stenosis  . Constipation    takes Colace daily  . Depression    takes Zoloft daily  . Diverticulosis of colon   . GERD (  gastroesophageal reflux disease)    atypical chest pain  . Gout    hx of;was on Allopurinol but taken off 6 wks ago by medical MD  . History of diverticulitis of colon   . HLD (hyperlipidemia)    takes Pravastatin daily  . HTN (hypertension)    takes Amlodipine and carvedilol  . Lumbar spondylosis with myelopathy   . Numbness   . OA (osteoarthritis)    right knee  . Osteopenia    takes Calcium and Vit D daily  . Scoliosis   . Vertigo    takes Antivert daily as needed   Past Surgical History:  Procedure Laterality Date  .  APPENDECTOMY  at age 56  . BACK SURGERY    . CATARACT EXTRACTION Right 04/2006  . CHOLECYSTECTOMY  1970  . COLONOSCOPY    . KNEE SURGERY Right    Medial meniscus tear  . LUMBAR LAMINECTOMY/DECOMPRESSION MICRODISCECTOMY Bilateral 11/13/2014   Procedure: Lumbar one-two Bilateral laminectomy and foraminotomy, Left Lumbar one-two microdiscectomy ;  Surgeon: Reinaldo Meeker, MD;  Location: [facility];  Service: Neurosurgery;  Laterality: Bilateral;  . NECK SURGERY  1990-2021   x 3; fusions  . PARTIAL COLECTOMY  09/1999   d/t diverticulitis  . TUBAL LIGATION     Social History   Tobacco Use  . Smoking status: Never Smoker  . Smokeless tobacco: Never Used  Vaping Use  . Vaping Use: Never used  Substance Use Topics  . Alcohol use: Never    Alcohol/week: 0.0 standard drinks  . Drug use: Never   Family History  Problem Relation Age of Onset  . Cancer Father        throat and bladder  . Diabetes Father   . Hypertension Father   . Heart attack Father   . Stroke Mother   . Alzheimer's disease Mother   . Hypertension Mother    Allergies  Allergen Reactions  . Ace Inhibitors     REACTION: increased K+, increased creat.  . Bactrim [Sulfamethoxazole-Trimethoprim]     rash  . Cephalosporins     REACTION: reaction not known  . Colesevelam     REACTION: constipation  . Hctz [Hydrochlorothiazide]     Makes her feel "bad"  . Losartan     Increased K  . Lovastatin     REACTION: doesn't work  . Lyrica [Pregabalin]     Felt bad   . Raloxifene     REACTION: cramps  . Rosuvastatin     REACTION: myalgia  . Simvastatin     REACTION: myalgia   Current Outpatient Medications on File Prior to Visit  Medication Sig Dispense Refill  . amLODipine (NORVASC) 10 MG tablet Take 1 tablet (10 mg total) by mouth daily. 90 tablet 3  . aspirin EC 81 MG tablet Take 1 tablet (81 mg total) by mouth daily. 90 tablet 3  . Calcium Carbonate-Vitamin D 500-125 MG-UNIT TABS Take by mouth.    .  Cranberry 250 MG CAPS Take 1 capsule by mouth daily.    Marland Kitchen docusate sodium (COLACE) 100 MG capsule Take 1-2 capsules (100-200 mg total) by mouth 2 (two) times daily. 100 mg in the morning and 200 mg at night 180 capsule 1  . famotidine (PEPCID) 20 MG tablet Take 1 tablet (20 mg total) by mouth 2 (two) times daily. 180 tablet 3  . fish oil-omega-3 fatty acids 1000 MG capsule Take 1 g by mouth at bedtime.    . furosemide (LASIX)  20 MG tablet TAKE 1 TABLET DAILY AS NEEDED FOR SWELLING 90 tablet 1  . GABAPENTIN PO Take by mouth.    Marland Kitchen ibuprofen (ADVIL) 800 MG tablet Take 800 mg by mouth daily as needed.    . meclizine (ANTIVERT) 25 MG tablet Take 25 mg by mouth 4 (four) times daily as needed for dizziness.    Marland Kitchen omeprazole (PRILOSEC) 20 MG capsule Take 1 capsule (20 mg total) by mouth daily. 90 capsule 3  . pravastatin (PRAVACHOL) 20 MG tablet Take 1 tablet (20 mg total) by mouth daily. 90 tablet 3  . sertraline (ZOLOFT) 100 MG tablet Take 1 tablet (100 mg total) by mouth daily. 90 tablet 3  . Tetrahydrozoline HCl (EYE DROPS OP) Apply 1-2 drops to eye as needed (for dry eye).     No current facility-administered medications on file prior to visit.    Review of Systems  Constitutional: Positive for fatigue. Negative for activity change, appetite change, fever and unexpected weight change.  HENT: Negative for congestion, ear pain, rhinorrhea, sinus pressure and sore throat.   Eyes: Negative for pain, redness and visual disturbance.  Respiratory: Negative for cough, shortness of breath and wheezing.   Cardiovascular: Negative for chest pain and palpitations.  Gastrointestinal: Negative for abdominal pain, blood in stool, constipation and diarrhea.  Endocrine: Negative for polydipsia and polyuria.  Genitourinary: Negative for dysuria, frequency and urgency.  Musculoskeletal: Negative for arthralgias, back pain and myalgias.  Skin: Negative for pallor and rash.  Allergic/Immunologic: Negative for  environmental allergies.  Neurological: Negative for dizziness, syncope and headaches.  Hematological: Negative for adenopathy. Does not bruise/bleed easily.  Psychiatric/Behavioral: Negative for decreased concentration and dysphoric mood. The patient is nervous/anxious.        Objective:   Physical Exam Constitutional:      General: She is not in acute distress.    Appearance: Normal appearance. She is well-developed and normal weight. She is not ill-appearing or diaphoretic.  HENT:     Head: Normocephalic and atraumatic.  Eyes:     Conjunctiva/sclera: Conjunctivae normal.     Pupils: Pupils are equal, round, and reactive to light.  Neck:     Thyroid: No thyromegaly.     Vascular: No carotid bruit or JVD.  Cardiovascular:     Rate and Rhythm: Normal rate and regular rhythm.     Heart sounds: Normal heart sounds. No gallop.   Pulmonary:     Effort: Pulmonary effort is normal. No respiratory distress.     Breath sounds: Normal breath sounds. No wheezing or rales.  Abdominal:     General: Bowel sounds are normal. There is no distension or abdominal bruit.     Palpations: Abdomen is soft. There is no mass.     Tenderness: There is no abdominal tenderness.  Musculoskeletal:     Cervical back: Normal range of motion and neck supple.     Right lower leg: No edema.     Left lower leg: No edema.  Lymphadenopathy:     Cervical: No cervical adenopathy.  Skin:    General: Skin is warm and dry.     Coloration: Skin is not pale.     Findings: No erythema or rash.  Neurological:     Mental Status: She is alert.     Coordination: Coordination normal.     Deep Tendon Reflexes: Reflexes are normal and symmetric. Reflexes normal.     Comments: Walks with a cane, steady gait  Baseline neuro changes L  hand   Psychiatric:        Mood and Affect: Mood is anxious.        Cognition and Memory: Cognition normal.     Comments: Anxious mood  Able to calm her talking  Fairly good insight              Assessment & Plan:   Problem List Items Addressed This Visit      Cardiovascular and Mediastinum   Essential hypertension - Primary (Chronic)    bp is improved today after an episode of elevation in the setting of anxiety  BP: 128/70  Other arm 132/66 and pt's cuff 139/66 Overall reassuring  Tolerating hydralazine 10 mg tid and the new clonidine patch 0.1 mg  Will continue to watch Pt will f/u in early June Also continue amlodipne 10 mg daily and lasix 20 mg daily  Enc her to check bp only when relaxed        Relevant Medications   hydrALAZINE (APRESOLINE) 10 MG tablet   cloNIDine (CATAPRES - DOSED IN MG/24 HR) 0.1 mg/24hr patch     Other   Depression with anxiety    More anxious lately watching bp Plan to continue sertraline 100 mg daily  Reviewed stressors/ coping techniques/symptoms/ support sources/ tx options and side effects in detail today

## 2021-02-10 NOTE — Assessment & Plan Note (Signed)
More anxious lately watching bp Plan to continue sertraline 100 mg daily  Reviewed stressors/ coping techniques/symptoms/ support sources/ tx options and side effects in detail today

## 2021-02-10 NOTE — Assessment & Plan Note (Signed)
bp is improved today after an episode of elevation in the setting of anxiety  BP: 128/70  Other arm 132/66 and pt's cuff 139/66 Overall reassuring  Tolerating hydralazine 10 mg tid and the new clonidine patch 0.1 mg  Will continue to watch Pt will f/u in early June Also continue amlodipne 10 mg daily and lasix 20 mg daily  Enc her to check bp only when relaxed

## 2021-02-23 ENCOUNTER — Other Ambulatory Visit: Payer: Self-pay | Admitting: Family Medicine

## 2021-02-25 NOTE — Telephone Encounter (Signed)
Clonidine patch refills.  I am glad she is doing better.

## 2021-03-04 ENCOUNTER — Ambulatory Visit (INDEPENDENT_AMBULATORY_CARE_PROVIDER_SITE_OTHER): Payer: Medicare HMO | Admitting: Family Medicine

## 2021-03-04 ENCOUNTER — Encounter: Payer: Self-pay | Admitting: Family Medicine

## 2021-03-04 ENCOUNTER — Other Ambulatory Visit: Payer: Self-pay

## 2021-03-04 VITALS — BP 125/88 | HR 86 | Temp 96.9°F | Ht 60.75 in | Wt 134.1 lb

## 2021-03-04 DIAGNOSIS — I10 Essential (primary) hypertension: Secondary | ICD-10-CM

## 2021-03-04 DIAGNOSIS — R531 Weakness: Secondary | ICD-10-CM

## 2021-03-04 NOTE — Progress Notes (Signed)
Subjective:    Patient ID: Linda Robinson, female    DOB: 1929/09/01, 87 y.o.   MRN: [id number]  This visit occurred during the SARS-CoV-2 public health emergency.  Safety protocols were in place, including screening questions prior to the visit, additional usage of staff PPE, and extensive cleaning of exam room while observing appropriate contact time as indicated for disinfecting solutions.    HPI Pt presents for f/u of HTN   Wt Readings from Last 3 Encounters:  03/04/21 134 lb 1 oz (60.8 kg)  02/10/21 137 lb 11.2 oz (62.5 kg)  02/03/21 139 lb (63 kg)   25.54 kg/m  Last visit for HTN - noted improvement with clonidine patch  BP Readings from Last 3 Encounters:  03/04/21 125/88  02/10/21 128/70  02/03/21 126/70    bp is pretty good/stable at home  Taking amlodipine 10 mg daily and lasix 20 mg daily and hydralazine 10 mg tid and clonidine patch 0.1 mg   Pulse Readings from Last 3 Encounters:  03/04/21 86  02/10/21 81  02/03/21 93   She feels weak and shaky about an hour after she takes medicine  Hard to describe Blood pressure is not too low and she eats so doubts sugar is low     Thinks ibuprofen may run her bp up -so she quit it   Lab Results  Component Value Date   CREATININE 1.04 06/05/2020   BUN 29 (H) 06/05/2020   NA 143 06/05/2020   K 4.7 06/05/2020   CL 101 06/05/2020   CO2 33 (H) 06/05/2020   Lab Results  Component Value Date   HGBA1C 5.8 06/05/2020    Patient Active Problem List   Diagnosis Date Noted  . Cervical radicular pain (left) 09/12/2020  . Cervical disc disorder with radiculopathy of cervical region 09/12/2020  . Cervical spondylosis 09/12/2020  . Cervical fusion syndrome (C4-T1 ACDF) 09/12/2020  . Neuropathic pain of hand, left 09/12/2020  . Chronic pain syndrome 09/12/2020  . Failed neck syndrome 07/30/2020  . Left hand pain 07/30/2020  . Generalized weakness 05/01/2020  . Grief reaction 11/17/2019  . Cervical stenosis of  spinal canal 11/17/2019  . Hearing loss 11/17/2019  . Poor balance 06/12/2019  . Screening mammogram, encounter for 05/23/2018  . Substernal chest pain 08/16/2017  . Atherosclerosis of aorta ([place]) 03/09/2017  . Weight loss 02/23/2017  . Routine general medical examination at a health care facility 07/05/2015  . Estrogen deficiency 07/05/2015  . Prediabetes 01/01/2015  . Lumbar spinal stenosis 11/13/2014  . Vertigo 10/05/2014  . Risk for falls 06/27/2014  . Low back pain on right side with sciatica 07/24/2013  . Edema 04/26/2013  . Encounter for Medicare annual wellness exam 04/11/2013  . GERD (gastroesophageal reflux disease)   . Osteopenia 01/30/2009  . CONSTIPATION 11/01/2007  . Depression with anxiety 02/14/2007  . Hyperlipidemia LDL goal <130 02/04/2007  . Gout 02/04/2007  . Essential hypertension 02/04/2007  . DIVERTICULOSIS, COLON 02/04/2007  . Renal insufficiency 02/04/2007  . OSTEOARTHRITIS 02/04/2007  . [place] DISEASE 02/04/2007   Past Medical History:  Diagnosis Date  . Ankylosis of lumbar spine   . Basal cell carcinoma    removed  . Cervical spine pain   . Chest pain    a. 02/2017 MV: EF 54%, small, distal anterior wall/apical infacrt (likely misregistration), no ischemia-->Low risk; b. 02/2017 Echo: EF 55-60%, mild MR, PASP 34mmHg.  Marland Kitchen Chronic back pain    stenosis  . Constipation  takes Colace daily  . Depression    takes Zoloft daily  . Diverticulosis of colon   . GERD (gastroesophageal reflux disease)    atypical chest pain  . Gout    hx of;was on Allopurinol but taken off 6 wks ago by medical MD  . History of diverticulitis of colon   . HLD (hyperlipidemia)    takes Pravastatin daily  . HTN (hypertension)    takes Amlodipine and carvedilol  . Lumbar spondylosis with myelopathy   . Numbness   . OA (osteoarthritis)    right knee  . Osteopenia    takes Calcium and Vit D daily  . Scoliosis   . Vertigo    takes Antivert daily as needed    Past Surgical History:  Procedure Laterality Date  . APPENDECTOMY  at age 74  . BACK SURGERY    . CATARACT EXTRACTION Right 04/2006  . CHOLECYSTECTOMY  1970  . COLONOSCOPY    . KNEE SURGERY Right    Medial meniscus tear  . LUMBAR LAMINECTOMY/DECOMPRESSION MICRODISCECTOMY Bilateral 11/13/2014   Procedure: Lumbar one-two Bilateral laminectomy and foraminotomy, Left Lumbar one-two microdiscectomy ;  Surgeon: Faythe Ghee, MD;  Location: [facility];  Service: Neurosurgery;  Laterality: Bilateral;  . NECK SURGERY  1990-2021   x 3; fusions  . PARTIAL COLECTOMY  09/1999   d/t diverticulitis  . TUBAL LIGATION     Social History   Tobacco Use  . Smoking status: Never Smoker  . Smokeless tobacco: Never Used  Vaping Use  . Vaping Use: Never used  Substance Use Topics  . Alcohol use: Never    Alcohol/week: 0.0 standard drinks  . Drug use: Never   Family History  Problem Relation Age of Onset  . Cancer Father        throat and bladder  . Diabetes Father   . Hypertension Father   . Heart attack Father   . Stroke Mother   . Alzheimer's disease Mother   . Hypertension Mother    Allergies  Allergen Reactions  . Ace Inhibitors     REACTION: increased K+, increased creat.  . Bactrim [Sulfamethoxazole-Trimethoprim]     rash  . Cephalosporins     REACTION: reaction not known  . Colesevelam     REACTION: constipation  . Hctz [Hydrochlorothiazide]     Makes her feel "bad"  . Losartan     Increased K  . Lovastatin     REACTION: doesn't work  . Lyrica [Pregabalin]     Felt bad   . Raloxifene     REACTION: cramps  . Rosuvastatin     REACTION: myalgia  . Simvastatin     REACTION: myalgia   Current Outpatient Medications on File Prior to Visit  Medication Sig Dispense Refill  . amLODipine (NORVASC) 10 MG tablet Take 1 tablet (10 mg total) by mouth daily. 90 tablet 3  . aspirin EC 81 MG tablet Take 1 tablet (81 mg total) by mouth daily. 90 tablet 3  . Calcium  Carbonate-Vitamin D 500-125 MG-UNIT TABS Take by mouth.    . cloNIDine (CATAPRES - DOSED IN MG/24 HR) 0.1 mg/24hr patch PLACE 1 PATCH (0.1 MG TOTAL) ONTO THE SKIN ONCE A WEEK. 4 patch 11  . Cranberry 250 MG CAPS Take 1 capsule by mouth daily.    Marland Kitchen docusate sodium (COLACE) 100 MG capsule Take 1-2 capsules (100-200 mg total) by mouth 2 (two) times daily. 100 mg in the morning and 200 mg at  night 180 capsule 1  . famotidine (PEPCID) 20 MG tablet Take 1 tablet (20 mg total) by mouth 2 (two) times daily. 180 tablet 3  . fish oil-omega-3 fatty acids 1000 MG capsule Take 1 g by mouth at bedtime.    . furosemide (LASIX) 20 MG tablet TAKE 1 TABLET DAILY AS NEEDED FOR SWELLING 90 tablet 1  . gabapentin (NEURONTIN) 100 MG capsule Take 1 capsule (100 mg total) by mouth 2 (two) times daily. (Patient taking differently: Take 100 mg by mouth. Take 1 pill by mouth with lunch and 1 pill in the evening (bedtime)) 180 capsule 3  . GABAPENTIN PO Take by mouth.    . hydrALAZINE (APRESOLINE) 10 MG tablet Take 1 tablet (10 mg total) by mouth 3 (three) times daily. 90 tablet 11  . ibuprofen (ADVIL) 800 MG tablet Take 800 mg by mouth daily as needed.    . meclizine (ANTIVERT) 25 MG tablet Take 25 mg by mouth 4 (four) times daily as needed for dizziness.    Marland Kitchen omeprazole (PRILOSEC) 20 MG capsule Take 1 capsule (20 mg total) by mouth daily. 90 capsule 3  . pravastatin (PRAVACHOL) 20 MG tablet Take 1 tablet (20 mg total) by mouth daily. 90 tablet 3  . sertraline (ZOLOFT) 100 MG tablet Take 1 tablet (100 mg total) by mouth daily. 90 tablet 3  . Tetrahydrozoline HCl (EYE DROPS OP) Apply 1-2 drops to eye as needed (for dry eye).     No current facility-administered medications on file prior to visit.    Review of Systems  Constitutional: Positive for fatigue. Negative for activity change, appetite change, fever and unexpected weight change.  HENT: Negative for congestion, ear pain, rhinorrhea, sinus pressure and sore throat.    Eyes: Negative for pain, redness and visual disturbance.  Respiratory: Negative for cough, shortness of breath and wheezing.   Cardiovascular: Negative for chest pain and palpitations.  Gastrointestinal: Negative for abdominal pain, blood in stool, constipation and diarrhea.  Endocrine: Negative for polydipsia and polyuria.  Genitourinary: Negative for dysuria, frequency and urgency.  Musculoskeletal: Negative for arthralgias, back pain and myalgias.       Chronic L hand pain-really bothers her   Skin: Negative for pallor and rash.  Allergic/Immunologic: Negative for environmental allergies.  Neurological: Positive for numbness. Negative for dizziness, syncope and headaches.       Chronic pain and numbness and weakness in L hand  Hematological: Negative for adenopathy. Does not bruise/bleed easily.  Psychiatric/Behavioral: Negative for decreased concentration and dysphoric mood. The patient is not nervous/anxious.        Objective:   Physical Exam Constitutional:      General: She is not in acute distress.    Appearance: Normal appearance. She is well-developed and normal weight. She is not ill-appearing.     Comments: Frail appearing  HENT:     Head: Normocephalic and atraumatic.  Eyes:     General: No scleral icterus.    Conjunctiva/sclera: Conjunctivae normal.     Pupils: Pupils are equal, round, and reactive to light.  Neck:     Thyroid: No thyromegaly.     Vascular: No carotid bruit or JVD.  Cardiovascular:     Rate and Rhythm: Normal rate and regular rhythm.     Pulses: Normal pulses.     Heart sounds: Normal heart sounds. No gallop.   Pulmonary:     Effort: Pulmonary effort is normal. No respiratory distress.     Breath sounds: Normal breath  sounds. No wheezing or rales.  Abdominal:     General: There is no abdominal bruit.  Musculoskeletal:     Cervical back: Normal range of motion and neck supple. No rigidity or tenderness.     Right lower leg: No edema.      Left lower leg: No edema.     Comments: Baseline muscle wasting in L hand  Lymphadenopathy:     Cervical: No cervical adenopathy.  Skin:    General: Skin is warm and dry.     Coloration: Skin is not pale.     Findings: No erythema or rash.  Neurological:     Mental Status: She is alert.     Deep Tendon Reflexes: Reflexes are normal and symmetric.     Comments: Gait is steady with cane on R side  Psychiatric:        Mood and Affect: Mood normal.           Assessment & Plan:   Problem List Items Addressed This Visit      Cardiovascular and Mediastinum   Essential hypertension - Primary (Chronic)    bp is stable/fairly well controlled  bp in fair control at this time  BP Readings from Last 1 Encounters:  03/04/21 125/88   No changes needed Most recent labs reviewed  Disc lifstyle change with low sodium diet and exercise  Plan to continue  Amlodipine 10 mg daily  Lasix 20 mg daily Hydralazine 10 mg tid Clonidine patch 0.1 mg  She continues to monitor at home        Other   Generalized weakness    Symptom of weakness about an hour after am meds (she has eaten)  Suspect this could be due to gabapentin  Adv she change dosing of that med to lunch time  (with food) and bedtime and continue to monitor She is eating regularly and good with fluid intake   If no improvement with this change will try dosing zoloft at night instead of am

## 2021-03-04 NOTE — Assessment & Plan Note (Addendum)
bp is stable/fairly well controlled  bp in fair control at this time  BP Readings from Last 1 Encounters:  03/04/21 125/88   No changes needed Most recent labs reviewed  Disc lifstyle change with low sodium diet and exercise  Plan to continue  Amlodipine 10 mg daily  Lasix 20 mg daily Hydralazine 10 mg tid Clonidine patch 0.1 mg  She continues to monitor at home

## 2021-03-04 NOTE — Patient Instructions (Addendum)
Change your gabapentin dosing time from morning to lunch (take with our after eating)   If no improvement then try dosing zoloft at bedtime instead of am   Then if you still feel feel bad let me know   Blood pressure if good Stay on your current medicines  If your blood pressure runs low (under 100/50) please let me know   Follow up in 3 months (earlier if needed)   Take care of yourself  Make sure to eat 3 meals per day

## 2021-03-04 NOTE — Assessment & Plan Note (Signed)
Symptom of weakness about an hour after am meds (she has eaten)  Suspect this could be due to gabapentin  Adv she change dosing of that med to lunch time  (with food) and bedtime and continue to monitor She is eating regularly and good with fluid intake   If no improvement with this change will try dosing zoloft at night instead of am

## 2021-03-07 ENCOUNTER — Encounter: Payer: Self-pay | Admitting: Family Medicine

## 2021-04-29 ENCOUNTER — Encounter: Payer: Self-pay | Admitting: Family Medicine

## 2021-04-29 ENCOUNTER — Other Ambulatory Visit: Payer: Self-pay

## 2021-04-29 ENCOUNTER — Ambulatory Visit (INDEPENDENT_AMBULATORY_CARE_PROVIDER_SITE_OTHER): Payer: Medicare HMO | Admitting: Family Medicine

## 2021-04-29 VITALS — BP 140/78 | HR 58 | Temp 98.4°F | Ht 60.75 in | Wt 134.2 lb

## 2021-04-29 DIAGNOSIS — M4802 Spinal stenosis, cervical region: Secondary | ICD-10-CM

## 2021-04-29 DIAGNOSIS — M5412 Radiculopathy, cervical region: Secondary | ICD-10-CM | POA: Diagnosis not present

## 2021-04-29 DIAGNOSIS — I700 Atherosclerosis of aorta: Secondary | ICD-10-CM

## 2021-04-29 DIAGNOSIS — I10 Essential (primary) hypertension: Secondary | ICD-10-CM | POA: Diagnosis not present

## 2021-04-29 DIAGNOSIS — R2689 Other abnormalities of gait and mobility: Secondary | ICD-10-CM

## 2021-04-29 MED ORDER — HYDRALAZINE HCL 10 MG PO TABS: 10.0000 mg | ORAL_TABLET | Freq: Two times a day (BID) | ORAL | 3 refills | Status: DC

## 2021-04-29 NOTE — Assessment & Plan Note (Signed)
Pt c/o hypotension/weakness  Has held clonidine and reduced hydralazine to bid and feels better  BP: 140/78  May need to permit bp to be slightly higher that goal for safety and to avoid orthostasis  Her cuff runs high, will consider getting a new one  F/u planned

## 2021-04-29 NOTE — Progress Notes (Signed)
Subjective:    Patient ID: Linda Robinson, female    DOB: 08/03/1929, 87 y.o.   MRN: [id number]  This visit occurred during the SARS-CoV-2 public health emergency.  Safety protocols were in place, including screening questions prior to the visit, additional usage of staff PPE, and extensive cleaning of exam room while observing appropriate contact time as indicated for disinfecting solutions.   HPI Pt presents for HTN and balance problems   Wt Readings from Last 3 Encounters:  04/29/21 134 lb 4 oz (60.9 kg)  03/04/21 134 lb 1 oz (60.8 kg)  02/10/21 137 lb 11.2 oz (62.5 kg)   25.58 kg/m  Since last visit her bp was low  111/68 one day and she felt weak after doing housework  Thinks her diastolic was in the [id number]  She cut hydralazine to twice daily  Stopped her clonidine patch instead   Feels better now   Takes gabapentin twice daily  No nsaids    More anxious than anything else  Her niece got a support dog and had to put it down to illness  That upset everybody   HTN bp is stable today  No cp or palpitations or headaches or edema  No side effects to medicines  BP Readings from Last 3 Encounters:  04/29/21 140/78  03/04/21 125/88  02/10/21 128/70     Pulse Readings from Last 3 Encounters:  04/29/21 (!) 58  03/04/21 86  02/10/21 81    Amlodipine 10 mg daily  Lasix 20 mg daily  Hydralazine 10 mg tid Clonidine patch 0.1 mg -pt stopped taking it   Bp at home are higher but cuff runs at least 10 points too high    Lab Results  Component Value Date   CREATININE 1.04 06/05/2020   BUN 29 (H) 06/05/2020   NA 143 06/05/2020   K 4.7 06/05/2020   CL 101 06/05/2020   CO2 33 (H) 06/05/2020    Balance problems have been multifactorial in the past  Last visit declined PT for balance Does shuffle when she walks  R knee needs more support   Neurologist is Dr Leta Baptist  Has been seen for her hand Did not discuss balance   Patient Active Problem List   Diagnosis  Date Noted   Cervical radicular pain (left) 09/12/2020   Cervical disc disorder with radiculopathy of cervical region 09/12/2020   Cervical spondylosis 09/12/2020   Cervical fusion syndrome (C4-T1 ACDF) 09/12/2020   Neuropathic pain of hand, left 09/12/2020   Chronic pain syndrome 09/12/2020   Failed neck syndrome 07/30/2020   Left hand pain 07/30/2020   Generalized weakness 05/01/2020   Grief reaction 11/17/2019   Cervical stenosis of spinal canal 11/17/2019   Hearing loss 11/17/2019   Poor balance 06/12/2019   Screening mammogram, encounter for 05/23/2018   Substernal chest pain 08/16/2017   Atherosclerosis of aorta ([place]) 03/09/2017   Weight loss 02/23/2017   Routine general medical examination at a health care facility 07/05/2015   Estrogen deficiency 07/05/2015   Prediabetes 01/01/2015   Lumbar spinal stenosis 11/13/2014   Vertigo 10/05/2014   Risk for falls 06/27/2014   Low back pain on right side with sciatica 07/24/2013   Edema 04/26/2013   Encounter for Medicare annual wellness exam 04/11/2013   GERD (gastroesophageal reflux disease)    Osteopenia 01/30/2009   CONSTIPATION 11/01/2007   Depression with anxiety 02/14/2007   Hyperlipidemia LDL goal <130 02/04/2007   Gout 02/04/2007   Essential hypertension 02/04/2007  DIVERTICULOSIS, COLON 02/04/2007   Renal insufficiency 02/04/2007   OSTEOARTHRITIS 02/04/2007   DEGENERATIVE [place] DISEASE 02/04/2007   Past Medical History:  Diagnosis Date   Ankylosis of lumbar spine    Basal cell carcinoma    removed   Cervical spine pain    Chest pain    a. 02/2017 MV: EF 54%, small, distal anterior wall/apical infacrt (likely misregistration), no ischemia-->Low risk; b. 02/2017 Echo: EF 55-60%, mild MR, PASP 38mHg.   Chronic back pain    stenosis   Constipation    takes Colace daily   Depression    takes Zoloft daily   Diverticulosis of colon    GERD (gastroesophageal reflux disease)    atypical chest pain   Gout    hx  of;was on Allopurinol but taken off 6 wks ago by medical MD   History of diverticulitis of colon    HLD (hyperlipidemia)    takes Pravastatin daily   HTN (hypertension)    takes Amlodipine and carvedilol   Lumbar spondylosis with myelopathy    Numbness    OA (osteoarthritis)    right knee   Osteopenia    takes Calcium and Vit D daily   Scoliosis    Vertigo    takes Antivert daily as needed   Past Surgical History:  Procedure Laterality Date   [place] 87  [place]   CHOLE[place]STECTOMY  1970   COLONOSCOPY     KNEE SURGERY Right    Medial meniscus tear   LUMBAR LAMINECTOMY/DECOMPRESSION MICRODISCECTOMY Bilateral 11/13/2014   Procedure: Lumbar one-two Bilateral laminectomy and foraminotomy, Left Lumbar one-two microdiscectomy ;  Surgeon: RFaythe Ghee MD;  Location: [facility];  Service: Neurosurgery;  Laterality: Bilateral;   NECK SURGERY  [phone]  x 3; fusions   PARTIAL COLECTOMY  09/1999   d/t diverticulitis   TUBAL LIGATION     Social History   Tobacco Use   Smoking status: Never   Smokeless tobacco: Never  Vaping Use   Vaping Use: Never used  Substance Use Topics   Alcohol use: Never    Alcohol/week: 0.0 standard drinks   Drug use: Never   Family History  Problem Relation Age of Onset   Cancer Father        throat and bladder   Diabetes Father    Hypertension Father    Heart attack Father    Stroke Mother    Alzheimer's disease Mother    Hypertension Mother    Allergies  Allergen Reactions   Ace Inhibitors     REACTION: increased K+, increased creat.   Bactrim [Sulfamethoxazole-Trimethoprim]     rash   Cephalosporins     REACTION: reaction not known   Colesevelam     REACTION: constipation   Hctz [Hydrochlorothiazide]     Makes her feel "bad"   Losartan     Increased K   Lovastatin     REACTION: doesn't work   Lyrica [Pregabalin]     Felt bad    Raloxifene     REACTION: cramps    Rosuvastatin     REACTION: myalgia   Simvastatin     REACTION: myalgia   Current Outpatient Medications on File Prior to Visit  Medication Sig Dispense Refill   amLODipine (NORVASC) 10 MG tablet Take 1 tablet (10 mg total) by mouth daily. 90 tablet 3   aspirin EC 81 MG tablet Take 1 tablet (  81 mg total) by mouth daily. 90 tablet 3   Calcium Carbonate-Vitamin D 500-125 MG-UNIT TABS Take by mouth.     Cranberry 250 MG CAPS Take 1 capsule by mouth daily.     docusate sodium (COLACE) 100 MG capsule Take 1-2 capsules (100-200 mg total) by mouth 2 (two) times daily. 100 mg in the morning and 200 mg at night 180 capsule 1   famotidine (PEPCID) 20 MG tablet Take 1 tablet (20 mg total) by mouth 2 (two) times daily. 180 tablet 3   fish oil-omega-3 fatty acids 1000 MG capsule Take 1 g by mouth at bedtime.     furosemide (LASIX) 20 MG tablet TAKE 1 TABLET DAILY AS NEEDED FOR SWELLING 90 tablet 1   gabapentin (NEURONTIN) 100 MG capsule Take 1 capsule (100 mg total) by mouth 2 (two) times daily. (Patient taking differently: Take 100 mg by mouth. Take 1 pill by mouth with lunch and 1 pill in the evening (bedtime)) 180 capsule 3   GABAPENTIN PO Take by mouth.     ibuprofen (ADVIL) 800 MG tablet Take 800 mg by mouth daily as needed.     meclizine (ANTIVERT) 25 MG tablet Take 25 mg by mouth 4 (four) times daily as needed for dizziness.     omeprazole (PRILOSEC) 20 MG capsule Take 1 capsule (20 mg total) by mouth daily. 90 capsule 3   pravastatin (PRAVACHOL) 20 MG tablet Take 1 tablet (20 mg total) by mouth daily. 90 tablet 3   sertraline (ZOLOFT) 100 MG tablet Take 1 tablet (100 mg total) by mouth daily. 90 tablet 3   Tetrahydrozoline HCl (EYE DROPS OP) Apply 1-2 drops to eye as needed (for dry eye).     No current facility-administered medications on file prior to visit.      Review of Systems  Constitutional:  Positive for fatigue. Negative for activity change, appetite change, fever and unexpected  weight change.  HENT:  Negative for congestion, ear pain, rhinorrhea, sinus pressure and sore throat.   Eyes:  Negative for pain, redness and visual disturbance.  Respiratory:  Negative for cough, shortness of breath and wheezing.   Cardiovascular:  Negative for chest pain and palpitations.  Gastrointestinal:  Negative for abdominal pain, blood in stool, constipation and diarrhea.  Endocrine: Negative for polydipsia and polyuria.  Genitourinary:  Negative for dysuria, frequency and urgency.  Musculoskeletal:  Negative for arthralgias, back pain and myalgias.  Skin:  Negative for pallor and rash.  Allergic/Immunologic: Negative for environmental allergies.  Neurological:  Positive for weakness, light-headedness and numbness. Negative for dizziness, syncope and headaches.  Hematological:  Negative for adenopathy. Does not bruise/bleed easily.  Psychiatric/Behavioral:  Negative for decreased concentration and dysphoric mood. The patient is nervous/anxious.       Objective:   Physical Exam Constitutional:      General: She is not in acute distress.    Appearance: Normal appearance. She is well-developed and normal weight. She is not ill-appearing or diaphoretic.  HENT:     Head: Normocephalic and atraumatic.  Eyes:     Conjunctiva/sclera: Conjunctivae normal.     Pupils: Pupils are equal, round, and reactive to light.  Neck:     Thyroid: No thyromegaly.     Vascular: No carotid bruit or JVD.  Cardiovascular:     Rate and Rhythm: Normal rate and regular rhythm.     Heart sounds: Normal heart sounds.    No gallop.  Pulmonary:     Effort: Pulmonary effort  is normal. No respiratory distress.     Breath sounds: Normal breath sounds. No wheezing or rales.  Abdominal:     General: Bowel sounds are normal. There is no distension or abdominal bruit.     Palpations: Abdomen is soft. There is no mass.     Tenderness: There is no abdominal tenderness.  Musculoskeletal:     Cervical back:  Normal range of motion and neck supple.     Right lower leg: No edema.     Left lower leg: No edema.  Lymphadenopathy:     Cervical: No cervical adenopathy.  Skin:    General: Skin is warm and dry.     Coloration: Skin is not pale.     Findings: No rash.  Neurological:     Mental Status: She is alert.     Coordination: Coordination normal.     Deep Tendon Reflexes: Reflexes are normal and symmetric. Reflexes normal.     Comments: L hand weakness and impaired sensation   Poor balance Some backward drift with rhomberg Favors L leg due to R leg pain  Limited rom hips and LS Cannot rise from chair w/o assistance   Psychiatric:        Mood and Affect: Mood is anxious.        Cognition and Memory: Cognition normal.     Comments: Mildly anxious Very pleasant          Assessment & Plan:   Problem List Items Addressed This Visit       Cardiovascular and Mediastinum   Essential hypertension (Chronic)    Pt c/o hypotension/weakness  Has held clonidine and reduced hydralazine to bid and feels better  BP: 140/78  May need to permit bp to be slightly higher that goal for safety and to avoid orthostasis  Her cuff runs high, will consider getting a new one  F/u planned        Relevant Medications   hydrALAZINE (APRESOLINE) 10 MG tablet   Atherosclerosis of aorta ([facility])    No symptoms or clinical change Plan to keep working on BP and lipid control        Relevant Medications   hydrALAZINE (APRESOLINE) 10 MG tablet     Other   Poor balance - Primary    Pt is open to PT if done at home since she is mobility impaired with R knee and also spinal stenosis  Very difficult to get out on her own  Uses walker much of the time, but L hand weakness prevents good grip and this is worrisome HH order done Disc fall prevention         Relevant Orders   Ambulatory referral to Home Health   Cervical stenosis of spinal canal   Relevant Orders   Ambulatory referral to Home Health    Cervical radicular pain (left)   Relevant Orders   Ambulatory referral to [place]

## 2021-04-29 NOTE — Assessment & Plan Note (Signed)
No symptoms or clinical change Plan to keep working on BP and lipid control

## 2021-04-29 NOTE — Patient Instructions (Addendum)
Stop the clonidine patch  It made your blood pressure too low  I think we will have to let your blood pressure be a little high (that is ok)  For blood pressure  Continue the Amlodipine 10 mg daily  Lasix 20 mg daily  Hydralazine 10 mg twice daily (I sent the prescription with new instructions to your [employer] mail order     Take the gabapentin twice daily  (not three times daily) for pain   I think this combo will make you feel the best   I will see if you qualify for home physical therapy for balance You will get a call  Also our pharmacist will give you a call to review medicines and answer questions

## 2021-04-29 NOTE — Assessment & Plan Note (Addendum)
Pt is open to PT if done at home since she is mobility impaired with R knee and also spinal stenosis  Very difficult to get out on her own  Uses walker much of the time, but L hand weakness prevents good grip and this is worrisome HH order done Disc fall prevention

## 2021-05-11 DIAGNOSIS — M4802 Spinal stenosis, cervical region: Secondary | ICD-10-CM | POA: Diagnosis not present

## 2021-05-11 DIAGNOSIS — M4716 Other spondylosis with myelopathy, lumbar region: Secondary | ICD-10-CM | POA: Diagnosis not present

## 2021-05-11 DIAGNOSIS — M810 Age-related osteoporosis without current pathological fracture: Secondary | ICD-10-CM | POA: Diagnosis not present

## 2021-05-11 DIAGNOSIS — M4726 Other spondylosis with radiculopathy, lumbar region: Secondary | ICD-10-CM | POA: Diagnosis not present

## 2021-05-11 DIAGNOSIS — M4722 Other spondylosis with radiculopathy, cervical region: Secondary | ICD-10-CM | POA: Diagnosis not present

## 2021-05-11 DIAGNOSIS — I10 Essential (primary) hypertension: Secondary | ICD-10-CM | POA: Diagnosis not present

## 2021-05-11 DIAGNOSIS — M1712 Unilateral primary osteoarthritis, left knee: Secondary | ICD-10-CM | POA: Diagnosis not present

## 2021-05-11 DIAGNOSIS — M48061 Spinal stenosis, lumbar region without neurogenic claudication: Secondary | ICD-10-CM | POA: Diagnosis not present

## 2021-05-11 DIAGNOSIS — G8929 Other chronic pain: Secondary | ICD-10-CM | POA: Diagnosis not present

## 2021-05-11 DIAGNOSIS — M5010 Cervical disc disorder with radiculopathy, unspecified cervical region: Secondary | ICD-10-CM | POA: Diagnosis not present

## 2021-05-14 DIAGNOSIS — G8929 Other chronic pain: Secondary | ICD-10-CM | POA: Diagnosis not present

## 2021-05-14 DIAGNOSIS — M1712 Unilateral primary osteoarthritis, left knee: Secondary | ICD-10-CM | POA: Diagnosis not present

## 2021-05-14 DIAGNOSIS — I10 Essential (primary) hypertension: Secondary | ICD-10-CM | POA: Diagnosis not present

## 2021-05-14 DIAGNOSIS — M4726 Other spondylosis with radiculopathy, lumbar region: Secondary | ICD-10-CM | POA: Diagnosis not present

## 2021-05-14 DIAGNOSIS — M4802 Spinal stenosis, cervical region: Secondary | ICD-10-CM | POA: Diagnosis not present

## 2021-05-14 DIAGNOSIS — M810 Age-related osteoporosis without current pathological fracture: Secondary | ICD-10-CM | POA: Diagnosis not present

## 2021-05-14 DIAGNOSIS — M5010 Cervical disc disorder with radiculopathy, unspecified cervical region: Secondary | ICD-10-CM | POA: Diagnosis not present

## 2021-05-14 DIAGNOSIS — M48061 Spinal stenosis, lumbar region without neurogenic claudication: Secondary | ICD-10-CM | POA: Diagnosis not present

## 2021-05-14 DIAGNOSIS — M4716 Other spondylosis with myelopathy, lumbar region: Secondary | ICD-10-CM | POA: Diagnosis not present

## 2021-05-14 DIAGNOSIS — M4722 Other spondylosis with radiculopathy, cervical region: Secondary | ICD-10-CM | POA: Diagnosis not present

## 2021-05-15 DIAGNOSIS — I10 Essential (primary) hypertension: Secondary | ICD-10-CM | POA: Diagnosis not present

## 2021-05-15 DIAGNOSIS — M5010 Cervical disc disorder with radiculopathy, unspecified cervical region: Secondary | ICD-10-CM | POA: Diagnosis not present

## 2021-05-15 DIAGNOSIS — M4722 Other spondylosis with radiculopathy, cervical region: Secondary | ICD-10-CM | POA: Diagnosis not present

## 2021-05-15 DIAGNOSIS — M4802 Spinal stenosis, cervical region: Secondary | ICD-10-CM | POA: Diagnosis not present

## 2021-05-15 DIAGNOSIS — M4726 Other spondylosis with radiculopathy, lumbar region: Secondary | ICD-10-CM | POA: Diagnosis not present

## 2021-05-15 DIAGNOSIS — M48061 Spinal stenosis, lumbar region without neurogenic claudication: Secondary | ICD-10-CM | POA: Diagnosis not present

## 2021-05-15 DIAGNOSIS — M810 Age-related osteoporosis without current pathological fracture: Secondary | ICD-10-CM | POA: Diagnosis not present

## 2021-05-15 DIAGNOSIS — G8929 Other chronic pain: Secondary | ICD-10-CM | POA: Diagnosis not present

## 2021-05-15 DIAGNOSIS — M4716 Other spondylosis with myelopathy, lumbar region: Secondary | ICD-10-CM | POA: Diagnosis not present

## 2021-05-15 DIAGNOSIS — M1712 Unilateral primary osteoarthritis, left knee: Secondary | ICD-10-CM | POA: Diagnosis not present

## 2021-05-18 ENCOUNTER — Other Ambulatory Visit: Payer: Self-pay | Admitting: Family Medicine

## 2021-05-20 ENCOUNTER — Telehealth: Payer: Self-pay

## 2021-05-20 NOTE — Telephone Encounter (Signed)
Pt said last seen 04/29/21 and had weakness then; pt has been reading possible side effects of gabapentin and pt wants to know if she can stop the gabapentin. Pt said she has a lot of itching all over her body that started shortly after starting the gabapentin. No rash. Pt said also thinks weakness is coming from gabapentin. Pt is presently taking gabapentin 100 mg bid. Pt is not having any difficulty breathing and no swelling in mouth, tongue or throat. Pt request cb when reviewed by Dr Glori Bickers. Pt has dental appt today at 11:30. [facility].

## 2021-05-20 NOTE — Telephone Encounter (Signed)
Pt notified of Dr. Marliss Coots instructions and verbalized understanding. She will keep [place] posted

## 2021-05-20 NOTE — Telephone Encounter (Signed)
If she thinks these symptoms are from gabapentin (certainly possible) then go ahead and wean off  Take 100 mg once daily for 1-2 weeks and then stop it  Let us know if this does not help  Unfortunately I know it was helping her symptoms some but probably not worth the side effects  Thanks for the heads up

## 2021-05-21 DIAGNOSIS — M810 Age-related osteoporosis without current pathological fracture: Secondary | ICD-10-CM | POA: Diagnosis not present

## 2021-05-21 DIAGNOSIS — M4726 Other spondylosis with radiculopathy, lumbar region: Secondary | ICD-10-CM | POA: Diagnosis not present

## 2021-05-21 DIAGNOSIS — M1712 Unilateral primary osteoarthritis, left knee: Secondary | ICD-10-CM | POA: Diagnosis not present

## 2021-05-21 DIAGNOSIS — M4802 Spinal stenosis, cervical region: Secondary | ICD-10-CM | POA: Diagnosis not present

## 2021-05-21 DIAGNOSIS — M5010 Cervical disc disorder with radiculopathy, unspecified cervical region: Secondary | ICD-10-CM | POA: Diagnosis not present

## 2021-05-21 DIAGNOSIS — M48061 Spinal stenosis, lumbar region without neurogenic claudication: Secondary | ICD-10-CM | POA: Diagnosis not present

## 2021-05-21 DIAGNOSIS — M4722 Other spondylosis with radiculopathy, cervical region: Secondary | ICD-10-CM | POA: Diagnosis not present

## 2021-05-21 DIAGNOSIS — I10 Essential (primary) hypertension: Secondary | ICD-10-CM | POA: Diagnosis not present

## 2021-05-21 DIAGNOSIS — G8929 Other chronic pain: Secondary | ICD-10-CM | POA: Diagnosis not present

## 2021-05-21 DIAGNOSIS — M4716 Other spondylosis with myelopathy, lumbar region: Secondary | ICD-10-CM | POA: Diagnosis not present

## 2021-05-22 DIAGNOSIS — M4722 Other spondylosis with radiculopathy, cervical region: Secondary | ICD-10-CM | POA: Diagnosis not present

## 2021-05-22 DIAGNOSIS — M4802 Spinal stenosis, cervical region: Secondary | ICD-10-CM | POA: Diagnosis not present

## 2021-05-22 DIAGNOSIS — M4726 Other spondylosis with radiculopathy, lumbar region: Secondary | ICD-10-CM | POA: Diagnosis not present

## 2021-05-22 DIAGNOSIS — M810 Age-related osteoporosis without current pathological fracture: Secondary | ICD-10-CM | POA: Diagnosis not present

## 2021-05-22 DIAGNOSIS — M4716 Other spondylosis with myelopathy, lumbar region: Secondary | ICD-10-CM | POA: Diagnosis not present

## 2021-05-22 DIAGNOSIS — M48061 Spinal stenosis, lumbar region without neurogenic claudication: Secondary | ICD-10-CM | POA: Diagnosis not present

## 2021-05-22 DIAGNOSIS — M5010 Cervical disc disorder with radiculopathy, unspecified cervical region: Secondary | ICD-10-CM | POA: Diagnosis not present

## 2021-05-22 DIAGNOSIS — M1712 Unilateral primary osteoarthritis, left knee: Secondary | ICD-10-CM | POA: Diagnosis not present

## 2021-05-22 DIAGNOSIS — G8929 Other chronic pain: Secondary | ICD-10-CM | POA: Diagnosis not present

## 2021-05-22 DIAGNOSIS — I10 Essential (primary) hypertension: Secondary | ICD-10-CM | POA: Diagnosis not present

## 2021-05-23 DIAGNOSIS — M810 Age-related osteoporosis without current pathological fracture: Secondary | ICD-10-CM | POA: Diagnosis not present

## 2021-05-23 DIAGNOSIS — M4726 Other spondylosis with radiculopathy, lumbar region: Secondary | ICD-10-CM | POA: Diagnosis not present

## 2021-05-23 DIAGNOSIS — I10 Essential (primary) hypertension: Secondary | ICD-10-CM | POA: Diagnosis not present

## 2021-05-23 DIAGNOSIS — M4716 Other spondylosis with myelopathy, lumbar region: Secondary | ICD-10-CM | POA: Diagnosis not present

## 2021-05-23 DIAGNOSIS — M4802 Spinal stenosis, cervical region: Secondary | ICD-10-CM | POA: Diagnosis not present

## 2021-05-23 DIAGNOSIS — M5010 Cervical disc disorder with radiculopathy, unspecified cervical region: Secondary | ICD-10-CM | POA: Diagnosis not present

## 2021-05-23 DIAGNOSIS — M1712 Unilateral primary osteoarthritis, left knee: Secondary | ICD-10-CM | POA: Diagnosis not present

## 2021-05-23 DIAGNOSIS — M48061 Spinal stenosis, lumbar region without neurogenic claudication: Secondary | ICD-10-CM | POA: Diagnosis not present

## 2021-05-23 DIAGNOSIS — M4722 Other spondylosis with radiculopathy, cervical region: Secondary | ICD-10-CM | POA: Diagnosis not present

## 2021-05-23 DIAGNOSIS — G8929 Other chronic pain: Secondary | ICD-10-CM | POA: Diagnosis not present

## 2021-05-28 DIAGNOSIS — M5010 Cervical disc disorder with radiculopathy, unspecified cervical region: Secondary | ICD-10-CM | POA: Diagnosis not present

## 2021-05-28 DIAGNOSIS — M1712 Unilateral primary osteoarthritis, left knee: Secondary | ICD-10-CM | POA: Diagnosis not present

## 2021-05-28 DIAGNOSIS — M4722 Other spondylosis with radiculopathy, cervical region: Secondary | ICD-10-CM | POA: Diagnosis not present

## 2021-05-28 DIAGNOSIS — M4802 Spinal stenosis, cervical region: Secondary | ICD-10-CM | POA: Diagnosis not present

## 2021-05-28 DIAGNOSIS — M810 Age-related osteoporosis without current pathological fracture: Secondary | ICD-10-CM | POA: Diagnosis not present

## 2021-05-28 DIAGNOSIS — M48061 Spinal stenosis, lumbar region without neurogenic claudication: Secondary | ICD-10-CM | POA: Diagnosis not present

## 2021-05-28 DIAGNOSIS — M4726 Other spondylosis with radiculopathy, lumbar region: Secondary | ICD-10-CM | POA: Diagnosis not present

## 2021-05-28 DIAGNOSIS — I10 Essential (primary) hypertension: Secondary | ICD-10-CM | POA: Diagnosis not present

## 2021-05-28 DIAGNOSIS — G8929 Other chronic pain: Secondary | ICD-10-CM | POA: Diagnosis not present

## 2021-05-28 DIAGNOSIS — M4716 Other spondylosis with myelopathy, lumbar region: Secondary | ICD-10-CM | POA: Diagnosis not present

## 2021-05-29 DIAGNOSIS — M810 Age-related osteoporosis without current pathological fracture: Secondary | ICD-10-CM | POA: Diagnosis not present

## 2021-05-29 DIAGNOSIS — I10 Essential (primary) hypertension: Secondary | ICD-10-CM | POA: Diagnosis not present

## 2021-05-29 DIAGNOSIS — M4716 Other spondylosis with myelopathy, lumbar region: Secondary | ICD-10-CM | POA: Diagnosis not present

## 2021-05-29 DIAGNOSIS — M4802 Spinal stenosis, cervical region: Secondary | ICD-10-CM | POA: Diagnosis not present

## 2021-05-29 DIAGNOSIS — M4722 Other spondylosis with radiculopathy, cervical region: Secondary | ICD-10-CM | POA: Diagnosis not present

## 2021-05-29 DIAGNOSIS — G8929 Other chronic pain: Secondary | ICD-10-CM | POA: Diagnosis not present

## 2021-05-29 DIAGNOSIS — M5010 Cervical disc disorder with radiculopathy, unspecified cervical region: Secondary | ICD-10-CM | POA: Diagnosis not present

## 2021-05-29 DIAGNOSIS — M48061 Spinal stenosis, lumbar region without neurogenic claudication: Secondary | ICD-10-CM | POA: Diagnosis not present

## 2021-05-29 DIAGNOSIS — M4726 Other spondylosis with radiculopathy, lumbar region: Secondary | ICD-10-CM | POA: Diagnosis not present

## 2021-05-29 DIAGNOSIS — M1712 Unilateral primary osteoarthritis, left knee: Secondary | ICD-10-CM | POA: Diagnosis not present

## 2021-05-30 DIAGNOSIS — G8929 Other chronic pain: Secondary | ICD-10-CM | POA: Diagnosis not present

## 2021-05-30 DIAGNOSIS — M4722 Other spondylosis with radiculopathy, cervical region: Secondary | ICD-10-CM | POA: Diagnosis not present

## 2021-05-30 DIAGNOSIS — M48061 Spinal stenosis, lumbar region without neurogenic claudication: Secondary | ICD-10-CM | POA: Diagnosis not present

## 2021-05-30 DIAGNOSIS — M1712 Unilateral primary osteoarthritis, left knee: Secondary | ICD-10-CM | POA: Diagnosis not present

## 2021-05-30 DIAGNOSIS — I10 Essential (primary) hypertension: Secondary | ICD-10-CM | POA: Diagnosis not present

## 2021-05-30 DIAGNOSIS — M5010 Cervical disc disorder with radiculopathy, unspecified cervical region: Secondary | ICD-10-CM | POA: Diagnosis not present

## 2021-05-30 DIAGNOSIS — M4726 Other spondylosis with radiculopathy, lumbar region: Secondary | ICD-10-CM | POA: Diagnosis not present

## 2021-05-30 DIAGNOSIS — M4716 Other spondylosis with myelopathy, lumbar region: Secondary | ICD-10-CM | POA: Diagnosis not present

## 2021-05-30 DIAGNOSIS — M810 Age-related osteoporosis without current pathological fracture: Secondary | ICD-10-CM | POA: Diagnosis not present

## 2021-05-30 DIAGNOSIS — M4802 Spinal stenosis, cervical region: Secondary | ICD-10-CM | POA: Diagnosis not present

## 2021-06-04 ENCOUNTER — Ambulatory Visit (INDEPENDENT_AMBULATORY_CARE_PROVIDER_SITE_OTHER): Payer: Medicare HMO | Admitting: Family Medicine

## 2021-06-04 ENCOUNTER — Encounter: Payer: Self-pay | Admitting: Family Medicine

## 2021-06-04 ENCOUNTER — Other Ambulatory Visit: Payer: Self-pay

## 2021-06-04 VITALS — BP 128/70 | HR 93 | Temp 97.8°F | Ht 60.08 in | Wt 136.0 lb

## 2021-06-04 DIAGNOSIS — E785 Hyperlipidemia, unspecified: Secondary | ICD-10-CM

## 2021-06-04 DIAGNOSIS — M810 Age-related osteoporosis without current pathological fracture: Secondary | ICD-10-CM | POA: Diagnosis not present

## 2021-06-04 DIAGNOSIS — G8929 Other chronic pain: Secondary | ICD-10-CM | POA: Diagnosis not present

## 2021-06-04 DIAGNOSIS — N289 Disorder of kidney and ureter, unspecified: Secondary | ICD-10-CM

## 2021-06-04 DIAGNOSIS — M5010 Cervical disc disorder with radiculopathy, unspecified cervical region: Secondary | ICD-10-CM

## 2021-06-04 DIAGNOSIS — M4802 Spinal stenosis, cervical region: Secondary | ICD-10-CM | POA: Diagnosis not present

## 2021-06-04 DIAGNOSIS — M1712 Unilateral primary osteoarthritis, left knee: Secondary | ICD-10-CM | POA: Diagnosis not present

## 2021-06-04 DIAGNOSIS — M48061 Spinal stenosis, lumbar region without neurogenic claudication: Secondary | ICD-10-CM | POA: Diagnosis not present

## 2021-06-04 DIAGNOSIS — I10 Essential (primary) hypertension: Secondary | ICD-10-CM

## 2021-06-04 DIAGNOSIS — M4726 Other spondylosis with radiculopathy, lumbar region: Secondary | ICD-10-CM | POA: Diagnosis not present

## 2021-06-04 DIAGNOSIS — M4716 Other spondylosis with myelopathy, lumbar region: Secondary | ICD-10-CM | POA: Diagnosis not present

## 2021-06-04 DIAGNOSIS — R7303 Prediabetes: Secondary | ICD-10-CM | POA: Diagnosis not present

## 2021-06-04 DIAGNOSIS — M4722 Other spondylosis with radiculopathy, cervical region: Secondary | ICD-10-CM | POA: Diagnosis not present

## 2021-06-04 LAB — CBC WITH DIFFERENTIAL/PLATELET
Basophils Absolute: 0 10*3/uL (ref 0.0–0.1)
Basophils Relative: 0.2 % (ref 0.0–3.0)
Eosinophils Absolute: 0.2 10*3/uL (ref 0.0–0.7)
Eosinophils Relative: 1.9 % (ref 0.0–5.0)
HCT: 42.3 % (ref 36.0–46.0)
Hemoglobin: 14 g/dL (ref 12.0–15.0)
Lymphocytes Relative: 40.3 % (ref 12.0–46.0)
Lymphs Abs: 4 10*3/uL (ref 0.7–4.0)
MCHC: 33.2 g/dL (ref 30.0–36.0)
MCV: 88.1 fl (ref 78.0–100.0)
Monocytes Absolute: 0.7 10*3/uL (ref 0.1–1.0)
Monocytes Relative: 7.4 % (ref 3.0–12.0)
Neutro Abs: 5 10*3/uL (ref 1.4–7.7)
Neutrophils Relative %: 50.2 % (ref 43.0–77.0)
Platelets: 301 10*3/uL (ref 150.0–400.0)
RBC: 4.8 Mil/uL (ref 3.87–5.11)
RDW: 13.5 % (ref 11.5–15.5)
WBC: 9.9 10*3/uL (ref 4.0–10.5)

## 2021-06-04 LAB — COMPREHENSIVE METABOLIC PANEL
ALT: 10 U/L (ref 0–35)
AST: 17 U/L (ref 0–37)
Albumin: 4.3 g/dL (ref 3.5–5.2)
Alkaline Phosphatase: 59 U/L (ref 39–117)
BUN: 18 mg/dL (ref 6–23)
CO2: 32 mEq/L (ref 19–32)
Calcium: 9.4 mg/dL (ref 8.4–10.5)
Chloride: 100 mEq/L (ref 96–112)
Creatinine, Ser: 1.04 mg/dL (ref 0.40–1.20)
GFR: 47.39 mL/min — ABNORMAL LOW (ref >60.00)
Glucose, Bld: 86 mg/dL (ref 70–99)
Potassium: 3.8 mEq/L (ref 3.5–5.1)
Sodium: 142 mEq/L (ref 135–145)
Total Bilirubin: 0.5 mg/dL (ref 0.2–1.2)
Total Protein: 6.5 g/dL (ref 6.0–8.3)

## 2021-06-04 LAB — HEMOGLOBIN A1C: Hgb A1c MFr Bld: 5.8 % (ref 4.6–6.5)

## 2021-06-04 LAB — LIPID PANEL
Cholesterol: 224 mg/dL — ABNORMAL HIGH (ref 0–200)
HDL: 66.8 mg/dL (ref >39.00)
LDL Cholesterol: 130 mg/dL — ABNORMAL HIGH (ref 0–99)
NonHDL: 157.52
Total CHOL/HDL Ratio: 3
Triglycerides: 139 mg/dL (ref 0.0–149.0)
VLDL: 27.8 mg/dL (ref 0.0–40.0)

## 2021-06-04 LAB — TSH: TSH: 2.89 u[IU]/mL (ref 0.35–5.50)

## 2021-06-04 NOTE — Progress Notes (Signed)
Subjective:    Patient ID: Linda Robinson, female    DOB: 1929-07-25, 87 y.o.   MRN: [id number]  This visit occurred during the SARS-CoV-2 public health emergency.  Safety protocols were in place, including screening questions prior to the visit, additional usage of staff PPE, and extensive cleaning of exam room while observing appropriate contact time as indicated for disinfecting solutions.   HPI Pt presents for f/u of HTN and chronic medical problems   Wt Readings from Last 3 Encounters:  06/04/21 136 lb (61.7 kg)  04/29/21 134 lb 4 oz (60.9 kg)  03/04/21 134 lb 1 oz (60.8 kg)   26.49 kg/m  HTN bp is stable today  No cp or palpitations or headaches or edema  No side effects to medicines  BP Readings from Last 3 Encounters:  06/04/21 128/70  04/29/21 140/78  03/04/21 125/88     Hydralazine 10 mg bid Amlodinpine 10 mg daily  Lasix 20 mg daily   Feels less dizzy  Overall feels better   Due for some labs    Balance with chronic pain  L hand weakness- stopped the gabapentin because it made her feel dizzy and itchy   We ordered home health for PT last time  It is helping with strength and can straighten out fingers now  Some numbness that is worse Wearing a splint at night and at times during the day   Lab Results  Component Value Date   CREATININE 1.04 06/05/2020   BUN 29 (H) 06/05/2020   NA 143 06/05/2020   K 4.7 06/05/2020   CL 101 06/05/2020   CO2 33 (H) 06/05/2020   Lab Results  Component Value Date   CHOL 199 06/05/2020   HDL 68.60 06/05/2020   LDLCALC 108 (H) 06/05/2020   LDLDIRECT 135.4 04/11/2013   TRIG 114.0 06/05/2020   CHOLHDL 3 06/05/2020   Pravastatin 20 mg daily  Fair diet/some fatty foods  Patient Active Problem List   Diagnosis Date Noted   Cervical radicular pain (left) 09/12/2020   Cervical disc disorder with radiculopathy of cervical region 09/12/2020   Cervical spondylosis 09/12/2020   Cervical fusion syndrome (C4-T1 ACDF)  09/12/2020   Neuropathic pain of hand, left 09/12/2020   Chronic pain syndrome 09/12/2020   Failed neck syndrome 07/30/2020   Left hand pain 07/30/2020   Generalized weakness 05/01/2020   Grief reaction 11/17/2019   Cervical stenosis of spinal canal 11/17/2019   Hearing loss 11/17/2019   Poor balance 06/12/2019   Screening mammogram, encounter for 05/23/2018   Substernal chest pain 08/16/2017   Atherosclerosis of aorta ([place]) 03/09/2017   Weight loss 02/23/2017   Routine general medical examination at a health care facility 07/05/2015   Estrogen deficiency 07/05/2015   Prediabetes 01/01/2015   Lumbar spinal stenosis 11/13/2014   Vertigo 10/05/2014   Risk for falls 06/27/2014   Low back pain on right side with sciatica 07/24/2013   Edema 04/26/2013   Encounter for Medicare annual wellness exam 04/11/2013   GERD (gastroesophageal reflux disease)    Osteopenia 01/30/2009   CONSTIPATION 11/01/2007   Depression with anxiety 02/14/2007   Hyperlipidemia LDL goal <130 02/04/2007   Gout 02/04/2007   Essential hypertension 02/04/2007   DIVERTICULOSIS, COLON 02/04/2007   Renal insufficiency 02/04/2007   OSTEOARTHRITIS 02/04/2007   DEGENERATIVE [place] DISEASE 02/04/2007   Past Medical History:  Diagnosis Date   Ankylosis of lumbar spine    Basal cell carcinoma    removed   Cervical spine  pain    Chest pain    a. 02/2017 MV: EF 54%, small, distal anterior wall/apical infacrt (likely misregistration), no ischemia-->Low risk; b. 02/2017 Echo: EF 55-60%, mild MR, PASP 24mHg.   Chronic back pain    stenosis   Constipation    takes Colace daily   Depression    takes Zoloft daily   Diverticulosis of colon    GERD (gastroesophageal reflux disease)    atypical chest pain   Gout    hx of;was on Allopurinol but taken off 6 wks ago by medical MD   History of diverticulitis of colon    HLD (hyperlipidemia)    takes Pravastatin daily   HTN (hypertension)    takes Amlodipine and carvedilol    Lumbar spondylosis with myelopathy    Numbness    OA (osteoarthritis)    right knee   Osteopenia    takes Calcium and Vit D daily   Scoliosis    Vertigo    takes Antivert daily as needed   Past Surgical History:  Procedure Laterality Date   APPENDECTOMY  at age 87  [place] 04/2006   CHOLECYSTECTOMY  1970   COLONOSCOPY     KNEE SURGERY Right    Medial meniscus tear   LUMBAR LAMINECTOMY/DECOMPRESSION MICRODISCECTOMY Bilateral 11/13/2014   Procedure: Lumbar one-two Bilateral laminectomy and foraminotomy, Left Lumbar one-two microdiscectomy ;  Surgeon: RFaythe Ghee MD;  Location: [facility];  Service: Neurosurgery;  Laterality: Bilateral;   NECK SURGERY  [phone]  x 3; fusions   PARTIAL COLECTOMY  09/1999   d/t diverticulitis   TUBAL LIGATION     Social History   Tobacco Use   Smoking status: Never   Smokeless tobacco: Never  Vaping Use   Vaping Use: Never used  Substance Use Topics   Alcohol use: Never    Alcohol/week: 0.0 standard drinks   Drug use: Never   Family History  Problem Relation Age of Onset   Cancer Father        throat and bladder   Diabetes Father    Hypertension Father    Heart attack Father    Stroke Mother    Alzheimer's disease Mother    Hypertension Mother    Allergies  Allergen Reactions   Ace Inhibitors     REACTION: increased K+, increased creat.   Bactrim [Sulfamethoxazole-Trimethoprim]     rash   Cephalosporins     REACTION: reaction not known   Colesevelam     REACTION: constipation   Hctz [Hydrochlorothiazide]     Makes her feel "bad"   Losartan     Increased K   Lovastatin     REACTION: doesn't work   Lyrica [Pregabalin]     Felt bad    Raloxifene     REACTION: cramps   Rosuvastatin     REACTION: myalgia   Simvastatin     REACTION: myalgia   Current Outpatient Medications on File Prior to Visit  Medication Sig Dispense Refill   amLODipine (NORVASC) 10 MG tablet Take 1  tablet (10 mg total) by mouth daily. 90 tablet 3   aspirin EC 81 MG tablet Take 1 tablet (81 mg total) by mouth daily. 90 tablet 3   Calcium Carbonate-Vitamin D 500-125 MG-UNIT TABS Take by mouth.     Cranberry 250 MG CAPS Take 1 capsule by mouth daily.     docusate sodium (COLACE) 100 MG capsule Take 1-2  capsules (100-200 mg total) by mouth 2 (two) times daily. 100 mg in the morning and 200 mg at night 180 capsule 1   famotidine (PEPCID) 20 MG tablet TAKE 1 TABLET TWICE A DAY 180 tablet 3   fish oil-omega-3 fatty acids 1000 MG capsule Take 1 g by mouth at bedtime.     furosemide (LASIX) 20 MG tablet TAKE 1 TABLET DAILY AS NEEDED FOR SWELLING 90 tablet 1   hydrALAZINE (APRESOLINE) 10 MG tablet Take 1 tablet (10 mg total) by mouth in the morning and at bedtime. 180 tablet 3   meclizine (ANTIVERT) 25 MG tablet Take 25 mg by mouth 4 (four) times daily as needed for dizziness.     omeprazole (PRILOSEC) 20 MG capsule Take 1 capsule (20 mg total) by mouth daily. 90 capsule 3   pravastatin (PRAVACHOL) 20 MG tablet Take 1 tablet (20 mg total) by mouth daily. 90 tablet 3   sertraline (ZOLOFT) 100 MG tablet Take 1 tablet (100 mg total) by mouth daily. 90 tablet 3   Tetrahydrozoline HCl (EYE DROPS OP) Apply 1-2 drops to eye as needed (for dry eye).     No current facility-administered medications on file prior to visit.    Review of Systems  Constitutional:  Positive for fatigue. Negative for activity change, appetite change, fever and unexpected weight change.  HENT:  Negative for congestion, ear pain, rhinorrhea, sinus pressure and sore throat.   Eyes:  Negative for pain, redness and visual disturbance.  Respiratory:  Negative for cough, shortness of breath and wheezing.   Cardiovascular:  Negative for chest pain and palpitations.  Gastrointestinal:  Negative for abdominal pain, blood in stool, constipation and diarrhea.  Endocrine: Negative for polydipsia and polyuria.  Genitourinary:  Negative for  dysuria, frequency and urgency.  Musculoskeletal:  Negative for arthralgias, back pain and myalgias.  Skin:  Negative for pallor and rash.  Allergic/Immunologic: Negative for environmental allergies.  Neurological:  Positive for weakness and numbness. Negative for dizziness, syncope and headaches.       Less dizzy  Hematological:  Negative for adenopathy. Does not bruise/bleed easily.  Psychiatric/Behavioral:  Negative for decreased concentration and dysphoric mood. The patient is not nervous/anxious.       Objective:   Physical Exam Constitutional:      General: She is not in acute distress.    Appearance: Normal appearance. She is normal weight. She is not ill-appearing.  HENT:     Ears:     Comments: Very poor hearing Eyes:     Conjunctiva/sclera: Conjunctivae normal.     Pupils: Pupils are equal, round, and reactive to light.  Neck:     Vascular: No carotid bruit.  Cardiovascular:     Rate and Rhythm: Normal rate and regular rhythm.     Pulses: Normal pulses.     Heart sounds: Normal heart sounds. No murmur heard. Pulmonary:     Effort: Pulmonary effort is normal. No respiratory distress.     Breath sounds: Normal breath sounds.  Musculoskeletal:     Right lower leg: No edema.     Left lower leg: No edema.     Comments: Baseline atrophy of L hand  Is able to extend fingers moreso that previously  Poor sensation and strength  Lymphadenopathy:     Cervical: No cervical adenopathy.  Skin:    Coloration: Skin is not pale.     Findings: No rash.  Neurological:     Mental Status: She is alert.  Sensory: Sensory deficit present.     Comments: Sensory and motor impairment of L hand with atrophy  Psychiatric:        Mood and Affect: Mood normal.     Comments: Good mood today          Assessment & Plan:   Problem List Items Addressed This Visit       Cardiovascular and Mediastinum   Essential hypertension - Primary (Chronic)    bp in fair control at this time   BP Readings from Last 1 Encounters:  06/04/21 128/70  No changes needed Most recent labs reviewed  Disc lifstyle change with low sodium diet and exercise  Labs ordered Plan to continue hydralazine 10 mg bid Amlodipine 10 mg daily  Lasix 20 mg daily        Relevant Orders   CBC with Differential/Platelet (Completed)   Comprehensive metabolic panel (Completed)   Lipid panel (Completed)   TSH (Completed)     Nervous and Auditory   Cervical disc disorder with radiculopathy of cervical region    Pt stopped gabapentin previously for nerve pain  Is overall doing better and less dizzy  Gait is more steady PT has helped with hand function and now she can extend fingers Plans to continue PT  Also splint for hand at night and prn        Genitourinary   Renal insufficiency    bmet today  Avoids nsaids Drinking water         Other   Hyperlipidemia LDL goal <130 (Chronic)    Disc goals for lipids and reasons to control them Rev last labs with pt Rev low sat fat diet in detail Labs today  Taking pravastatin 20 mg daily  Diet is fair      Relevant Orders   Lipid panel (Completed)   Prediabetes    A1C today  Wt loss noted disc imp of low glycemic diet and wt loss to prevent DM2       Relevant Orders   Hemoglobin A1c (Completed)

## 2021-06-04 NOTE — Assessment & Plan Note (Signed)
A1C today  Wt loss noted disc imp of low glycemic diet and wt loss to prevent DM2

## 2021-06-04 NOTE — Assessment & Plan Note (Signed)
Pt stopped gabapentin previously for nerve pain  Is overall doing better and less dizzy  Gait is more steady PT has helped with hand function and now she can extend fingers Plans to continue PT  Also splint for hand at night and prn

## 2021-06-04 NOTE — Assessment & Plan Note (Signed)
Disc goals for lipids and reasons to control them Rev last labs with pt Rev low sat fat diet in detail Labs today  Taking pravastatin 20 mg daily  Diet is fair

## 2021-06-04 NOTE — Assessment & Plan Note (Signed)
bmet today  Avoids nsaids Drinking water

## 2021-06-04 NOTE — Patient Instructions (Signed)
Labs today   Blood pressure is good No change in medicines   Glad you are feeling better off the gabapentin Keep doing your physical therapy

## 2021-06-04 NOTE — Assessment & Plan Note (Signed)
bp in fair control at this time  BP Readings from Last 1 Encounters:  06/04/21 128/70   No changes needed Most recent labs reviewed  Disc lifstyle change with low sodium diet and exercise  Labs ordered Plan to continue hydralazine 10 mg bid Amlodipine 10 mg daily  Lasix 20 mg daily

## 2021-06-05 ENCOUNTER — Encounter: Payer: Self-pay | Admitting: *Deleted

## 2021-06-05 DIAGNOSIS — M4716 Other spondylosis with myelopathy, lumbar region: Secondary | ICD-10-CM | POA: Diagnosis not present

## 2021-06-05 DIAGNOSIS — I10 Essential (primary) hypertension: Secondary | ICD-10-CM | POA: Diagnosis not present

## 2021-06-05 DIAGNOSIS — M1712 Unilateral primary osteoarthritis, left knee: Secondary | ICD-10-CM | POA: Diagnosis not present

## 2021-06-05 DIAGNOSIS — M4722 Other spondylosis with radiculopathy, cervical region: Secondary | ICD-10-CM | POA: Diagnosis not present

## 2021-06-05 DIAGNOSIS — M48061 Spinal stenosis, lumbar region without neurogenic claudication: Secondary | ICD-10-CM | POA: Diagnosis not present

## 2021-06-05 DIAGNOSIS — M810 Age-related osteoporosis without current pathological fracture: Secondary | ICD-10-CM | POA: Diagnosis not present

## 2021-06-05 DIAGNOSIS — M5010 Cervical disc disorder with radiculopathy, unspecified cervical region: Secondary | ICD-10-CM | POA: Diagnosis not present

## 2021-06-05 DIAGNOSIS — M4726 Other spondylosis with radiculopathy, lumbar region: Secondary | ICD-10-CM | POA: Diagnosis not present

## 2021-06-05 DIAGNOSIS — G8929 Other chronic pain: Secondary | ICD-10-CM | POA: Diagnosis not present

## 2021-06-05 DIAGNOSIS — M4802 Spinal stenosis, cervical region: Secondary | ICD-10-CM | POA: Diagnosis not present

## 2021-06-09 ENCOUNTER — Other Ambulatory Visit: Payer: Self-pay | Admitting: Family Medicine

## 2021-06-11 DIAGNOSIS — M4716 Other spondylosis with myelopathy, lumbar region: Secondary | ICD-10-CM | POA: Diagnosis not present

## 2021-06-11 DIAGNOSIS — M810 Age-related osteoporosis without current pathological fracture: Secondary | ICD-10-CM | POA: Diagnosis not present

## 2021-06-11 DIAGNOSIS — G8929 Other chronic pain: Secondary | ICD-10-CM | POA: Diagnosis not present

## 2021-06-11 DIAGNOSIS — M1712 Unilateral primary osteoarthritis, left knee: Secondary | ICD-10-CM | POA: Diagnosis not present

## 2021-06-11 DIAGNOSIS — M48061 Spinal stenosis, lumbar region without neurogenic claudication: Secondary | ICD-10-CM | POA: Diagnosis not present

## 2021-06-11 DIAGNOSIS — M4802 Spinal stenosis, cervical region: Secondary | ICD-10-CM | POA: Diagnosis not present

## 2021-06-11 DIAGNOSIS — M5010 Cervical disc disorder with radiculopathy, unspecified cervical region: Secondary | ICD-10-CM | POA: Diagnosis not present

## 2021-06-11 DIAGNOSIS — I10 Essential (primary) hypertension: Secondary | ICD-10-CM | POA: Diagnosis not present

## 2021-06-11 DIAGNOSIS — M4726 Other spondylosis with radiculopathy, lumbar region: Secondary | ICD-10-CM | POA: Diagnosis not present

## 2021-06-11 DIAGNOSIS — M4722 Other spondylosis with radiculopathy, cervical region: Secondary | ICD-10-CM | POA: Diagnosis not present

## 2021-06-17 DIAGNOSIS — M4726 Other spondylosis with radiculopathy, lumbar region: Secondary | ICD-10-CM | POA: Diagnosis not present

## 2021-06-17 DIAGNOSIS — M48061 Spinal stenosis, lumbar region without neurogenic claudication: Secondary | ICD-10-CM | POA: Diagnosis not present

## 2021-06-17 DIAGNOSIS — M1712 Unilateral primary osteoarthritis, left knee: Secondary | ICD-10-CM | POA: Diagnosis not present

## 2021-06-17 DIAGNOSIS — I10 Essential (primary) hypertension: Secondary | ICD-10-CM | POA: Diagnosis not present

## 2021-06-17 DIAGNOSIS — M4722 Other spondylosis with radiculopathy, cervical region: Secondary | ICD-10-CM | POA: Diagnosis not present

## 2021-06-17 DIAGNOSIS — G8929 Other chronic pain: Secondary | ICD-10-CM | POA: Diagnosis not present

## 2021-06-17 DIAGNOSIS — M810 Age-related osteoporosis without current pathological fracture: Secondary | ICD-10-CM | POA: Diagnosis not present

## 2021-06-17 DIAGNOSIS — M5010 Cervical disc disorder with radiculopathy, unspecified cervical region: Secondary | ICD-10-CM | POA: Diagnosis not present

## 2021-06-17 DIAGNOSIS — M4802 Spinal stenosis, cervical region: Secondary | ICD-10-CM | POA: Diagnosis not present

## 2021-06-17 DIAGNOSIS — M4716 Other spondylosis with myelopathy, lumbar region: Secondary | ICD-10-CM | POA: Diagnosis not present

## 2021-06-28 ENCOUNTER — Other Ambulatory Visit: Payer: Self-pay | Admitting: Family Medicine

## 2021-07-04 DIAGNOSIS — M4722 Other spondylosis with radiculopathy, cervical region: Secondary | ICD-10-CM | POA: Diagnosis not present

## 2021-07-04 DIAGNOSIS — M4802 Spinal stenosis, cervical region: Secondary | ICD-10-CM | POA: Diagnosis not present

## 2021-07-04 DIAGNOSIS — M1712 Unilateral primary osteoarthritis, left knee: Secondary | ICD-10-CM | POA: Diagnosis not present

## 2021-07-04 DIAGNOSIS — M810 Age-related osteoporosis without current pathological fracture: Secondary | ICD-10-CM | POA: Diagnosis not present

## 2021-07-04 DIAGNOSIS — M48061 Spinal stenosis, lumbar region without neurogenic claudication: Secondary | ICD-10-CM | POA: Diagnosis not present

## 2021-07-04 DIAGNOSIS — M5010 Cervical disc disorder with radiculopathy, unspecified cervical region: Secondary | ICD-10-CM | POA: Diagnosis not present

## 2021-07-04 DIAGNOSIS — M4716 Other spondylosis with myelopathy, lumbar region: Secondary | ICD-10-CM | POA: Diagnosis not present

## 2021-07-04 DIAGNOSIS — G8929 Other chronic pain: Secondary | ICD-10-CM | POA: Diagnosis not present

## 2021-07-04 DIAGNOSIS — I10 Essential (primary) hypertension: Secondary | ICD-10-CM | POA: Diagnosis not present

## 2021-07-04 DIAGNOSIS — M4726 Other spondylosis with radiculopathy, lumbar region: Secondary | ICD-10-CM | POA: Diagnosis not present

## 2021-07-23 ENCOUNTER — Other Ambulatory Visit: Payer: Self-pay

## 2021-07-23 ENCOUNTER — Telehealth: Payer: Self-pay

## 2021-07-23 ENCOUNTER — Ambulatory Visit
Admission: RE | Admit: 2021-07-23 | Discharge: 2021-07-23 | Disposition: A | Discharge status: Home / Self Care | Payer: Medicare HMO | Source: Ambulatory Visit | Attending: Family Medicine | Admitting: Family Medicine

## 2021-07-23 ENCOUNTER — Encounter: Payer: Self-pay | Admitting: Family Medicine

## 2021-07-23 ENCOUNTER — Ambulatory Visit (INDEPENDENT_AMBULATORY_CARE_PROVIDER_SITE_OTHER): Payer: Medicare HMO | Admitting: Family Medicine

## 2021-07-23 VITALS — BP 138/72 | HR 74 | Temp 97.3°F | Ht 60.0 in | Wt 133.0 lb

## 2021-07-23 DIAGNOSIS — R350 Frequency of micturition: Secondary | ICD-10-CM | POA: Diagnosis not present

## 2021-07-23 DIAGNOSIS — R1030 Lower abdominal pain, unspecified: Secondary | ICD-10-CM | POA: Diagnosis not present

## 2021-07-23 DIAGNOSIS — K5790 Diverticulosis of intestine, part unspecified, without perforation or abscess without bleeding: Secondary | ICD-10-CM | POA: Insufficient documentation

## 2021-07-23 NOTE — Progress Notes (Signed)
Subjective:    Patient ID: Linda Robinson, female    DOB: 1929/06/12, 87 y.o.   MRN: [id number]  This visit occurred during the SARS-CoV-2 public health emergency.  Safety protocols were in place, including screening questions prior to the visit, additional usage of staff PPE, and extensive cleaning of exam room while observing appropriate contact time as indicated for disinfecting solutions.   HPI Pt presents with c/o abdominal pain   Wt Readings from Last 3 Encounters:  07/23/21 133 lb (60.3 kg)  06/04/21 136 lb (61.7 kg)  04/29/21 134 lb 4 oz (60.9 kg)   25.97 kg/m  Some hurting down in low abdomen (for about a week) No appetite anyway   Feels generally weak  Sometimes light headed  Has to sit down and put a rag on her face  It scares her No LOC  More constipated  Not tender  No diarrhea   No freq or urgency of urination  No dysuria  Urine smells funny  No blood in urine or stool   Trying to drink a lot of fluid   No n/v   Takes medicine every day (dulcolax and stool softener)   Yesterday it happened 3 times in a row   Stress: is bad right now  2 sons have been in the hospital  One with heart issues One with pancreatitis (in hospital now)  Has her very worried    Last colonoscopy 2000 Had diverticulosis   Patient Active Problem List   Diagnosis Date Noted   Lower abdominal pain 07/23/2021   Frequency of urination 07/23/2021   Diverticulosis 07/23/2021   Cervical radicular pain (left) 09/12/2020   Cervical disc disorder with radiculopathy of cervical region 09/12/2020   Cervical spondylosis 09/12/2020   Cervical fusion syndrome (C4-T1 ACDF) 09/12/2020   Neuropathic pain of hand, left 09/12/2020   Chronic pain syndrome 09/12/2020   Failed neck syndrome 07/30/2020   Left hand pain 07/30/2020   Generalized weakness 05/01/2020   Grief reaction 11/17/2019   Cervical stenosis of spinal canal 11/17/2019   Hearing loss 11/17/2019   Poor balance  06/12/2019   Screening mammogram, encounter for 05/23/2018   Substernal chest pain 08/16/2017   Atherosclerosis of aorta ([place]) 03/09/2017   Weight loss 02/23/2017   Routine general medical examination at a health care facility 07/05/2015   Estrogen deficiency 07/05/2015   Prediabetes 01/01/2015   Lumbar spinal stenosis 11/13/2014   Vertigo 10/05/2014   Risk for falls 06/27/2014   Low back pain on right side with sciatica 07/24/2013   Edema 04/26/2013   Encounter for Medicare annual wellness exam 04/11/2013   GERD (gastroesophageal reflux disease)    Osteopenia 01/30/2009   CONSTIPATION 11/01/2007   Depression with anxiety 02/14/2007   Hyperlipidemia LDL goal <130 02/04/2007   Gout 02/04/2007   Essential hypertension 02/04/2007   DIVERTICULOSIS, COLON 02/04/2007   Renal insufficiency 02/04/2007   OSTEOARTHRITIS 02/04/2007   DEGENERATIVE [place] DISEASE 02/04/2007   Past Medical History:  Diagnosis Date   Ankylosis of lumbar spine    Basal cell carcinoma    removed   Cervical spine pain    Chest pain    a. 02/2017 MV: EF 54%, small, distal anterior wall/apical infacrt (likely misregistration), no ischemia-->Low risk; b. 02/2017 Echo: EF 55-60%, mild MR, PASP 14mmHg.   Chronic back pain    stenosis   Constipation    takes Colace daily   Depression    takes Zoloft daily   Diverticulosis of colon  GERD (gastroesophageal reflux disease)    atypical chest pain   Gout    hx of;was on Allopurinol but taken off 6 wks ago by medical MD   History of diverticulitis of colon    HLD (hyperlipidemia)    takes Pravastatin daily   HTN (hypertension)    takes Amlodipine and carvedilol   Lumbar spondylosis with myelopathy    Numbness    OA (osteoarthritis)    right knee   Osteopenia    takes Calcium and Vit D daily   Scoliosis    Vertigo    takes Antivert daily as needed   Past Surgical History:  Procedure Laterality Date   APPENDECTOMY  at age 25   [place] Right 04/2006   CHOLECYSTECTOMY  1970   COLONOSCOPY     KNEE SURGERY Right    Medial meniscus tear   LUMBAR LAMINECTOMY/DECOMPRESSION MICRODISCECTOMY Bilateral 11/13/2014   Procedure: Lumbar one-two Bilateral laminectomy and foraminotomy, Left Lumbar one-two microdiscectomy ;  Surgeon: Faythe Ghee, MD;  Location: [facility];  Service: Neurosurgery;  Laterality: Bilateral;   NECK SURGERY  [phone]   x 3; fusions   PARTIAL COLECTOMY  09/1999   d/t diverticulitis   TUBAL LIGATION     Social History   Tobacco Use   Smoking status: Never   Smokeless tobacco: Never  Vaping Use   Vaping Use: Never used  Substance Use Topics   Alcohol use: Never    Alcohol/week: 0.0 standard drinks   Drug use: Never   Family History  Problem Relation Age of Onset   Cancer Father        throat and bladder   Diabetes Father    Hypertension Father    Heart attack Father    Stroke Mother    Alzheimer's disease Mother    Hypertension Mother    Allergies  Allergen Reactions   Ace Inhibitors     REACTION: increased K+, increased creat.   Bactrim [Sulfamethoxazole-Trimethoprim]     rash   Cephalosporins     REACTION: reaction not known   Colesevelam     REACTION: constipation   Hctz [Hydrochlorothiazide]     Makes her feel "bad"   Losartan     Increased K   Lovastatin     REACTION: doesn't work   Lyrica [Pregabalin]     Felt bad    Raloxifene     REACTION: cramps   Rosuvastatin     REACTION: myalgia   Simvastatin     REACTION: myalgia   Current Outpatient Medications on File Prior to Visit  Medication Sig Dispense Refill   amLODipine (NORVASC) 10 MG tablet TAKE 1 TABLET DAILY 90 tablet 3   aspirin EC 81 MG tablet Take 1 tablet (81 mg total) by mouth daily. 90 tablet 3   Calcium Carbonate-Vitamin D 500-125 MG-UNIT TABS Take by mouth.     Cranberry 250 MG CAPS Take 1 capsule by mouth daily.     docusate sodium (COLACE) 100 MG capsule Take 1-2 capsules (100-200 mg total)  by mouth 2 (two) times daily. 100 mg in the morning and 200 mg at night 180 capsule 1   famotidine (PEPCID) 20 MG tablet TAKE 1 TABLET TWICE A DAY 180 tablet 3   fish oil-omega-3 fatty acids 1000 MG capsule Take 1 g by mouth at bedtime.     furosemide (LASIX) 20 MG tablet TAKE 1 TABLET DAILY AS NEEDED FOR SWELLING 90  tablet 1   hydrALAZINE (APRESOLINE) 10 MG tablet Take 1 tablet (10 mg total) by mouth in the morning and at bedtime. 180 tablet 2   meclizine (ANTIVERT) 25 MG tablet Take 25 mg by mouth 4 (four) times daily as needed for dizziness.     omeprazole (PRILOSEC) 20 MG capsule TAKE 1 CAPSULE DAILY 90 capsule 3   pravastatin (PRAVACHOL) 20 MG tablet TAKE 1 TABLET DAILY 90 tablet 3   sertraline (ZOLOFT) 100 MG tablet TAKE 1 TABLET DAILY 90 tablet 3   Tetrahydrozoline HCl (EYE DROPS OP) Apply 1-2 drops to eye as needed (for dry eye).     No current facility-administered medications on file prior to visit.    Review of Systems  Constitutional:  Positive for fatigue. Negative for activity change, appetite change, fever and unexpected weight change.  HENT:  Negative for congestion, ear pain, rhinorrhea, sinus pressure and sore throat.   Eyes:  Negative for pain, redness and visual disturbance.  Respiratory:  Negative for cough, shortness of breath and wheezing.   Cardiovascular:  Negative for chest pain and palpitations.  Gastrointestinal:  Positive for abdominal pain and constipation. Negative for abdominal distention, anal bleeding, blood in stool, diarrhea, nausea, rectal pain and vomiting.  Endocrine: Negative for polydipsia and polyuria.  Genitourinary:  Negative for dysuria, frequency and urgency.  Musculoskeletal:  Negative for arthralgias, back pain and myalgias.  Skin:  Negative for pallor and rash.  Allergic/Immunologic: Negative for environmental allergies.  Neurological:  Negative for dizziness, syncope and headaches.       Feels generally weak but no focal loss of strength  (except baseline L hand)  Baseline paresthesia L hand  Hematological:  Negative for adenopathy. Does not bruise/bleed easily.  Psychiatric/Behavioral:  Negative for decreased concentration and dysphoric mood. The patient is nervous/anxious.       Objective:   Physical Exam Constitutional:      General: She is not in acute distress.    Appearance: She is well-developed and normal weight.  HENT:     Head: Normocephalic and atraumatic.  Eyes:     Conjunctiva/sclera: Conjunctivae normal.     Pupils: Pupils are equal, round, and reactive to light.  Neck:     Thyroid: No thyromegaly.     Vascular: No carotid bruit or JVD.  Cardiovascular:     Rate and Rhythm: Normal rate and regular rhythm.     Heart sounds: Normal heart sounds.    No gallop.  Pulmonary:     Effort: Pulmonary effort is normal. No respiratory distress.     Breath sounds: Normal breath sounds. No wheezing or rales.  Abdominal:     General: Abdomen is flat. Bowel sounds are normal. There is no distension or abdominal bruit.     Palpations: Abdomen is soft. There is no hepatomegaly, splenomegaly, mass or pulsatile mass.     Tenderness: There is abdominal tenderness in the right lower quadrant, suprapubic area and left lower quadrant. There is no right CVA tenderness, left CVA tenderness, guarding or rebound. Negative signs include Murphy's sign and McBurney's sign.     Comments: Mild diffuse lower abdominal tenderness  No rebound or guarding   Musculoskeletal:     Cervical back: Normal range of motion and neck supple.     Right lower leg: No edema.     Left lower leg: No edema.  Lymphadenopathy:     Cervical: No cervical adenopathy.  Skin:    General: Skin is warm and dry.  Coloration: Skin is not jaundiced or pale.     Findings: No erythema or rash.  Neurological:     Mental Status: She is alert.     Coordination: Coordination normal.     Deep Tendon Reflexes: Reflexes are normal and symmetric. Reflexes  normal.  Psychiatric:        Mood and Affect: Mood normal.          Assessment & Plan:   Problem List Items Addressed This Visit       Digestive   Diverticulosis    Unsure if this adds to her abdominal symptoms  No blood in stool or focal tenderness Some constipation  Labs and abd xr ordered       Relevant Orders   CBC with Differential/Platelet     Other   Lower abdominal pain - Primary    Bilateral, comes and goes with constipation  Some generalized weakness and malaise She has used dulcolax  No blood in stool and non focal tenderness  Labs and abd xray ordered   Encouraged fluids  Try miralax for constipation  Pending results  inst her to call if symptoms worsen       Relevant Orders   POCT Urinalysis Dipstick (Automated)   CBC with Differential/Platelet   Comprehensive metabolic panel   DG Abd 2 Views   Frequency of urination    ua ordered  Enc good fluids  Also has constipation       Relevant Orders   POCT Urinalysis Dipstick (Automated)

## 2021-07-23 NOTE — Telephone Encounter (Signed)
[facility] - Client TELEPHONE ADVICE RECORD [facility]urse Patient Name: [facility]A [facility] Gender: Female DOB: 10/02/1928 Age: 87 Y 57 M 3 D Return Phone Number: [phone] (Primary) Address[place]  [address] Client [facility] - Client Client Site [place] Glori Bickers, Roque Lias - MD Contact Type Call Who Is Calling Patient / Member / Family / Caregiver Call Type Triage / Clinical Relationship To Patient Self Return Phone Number [phone] (Primary) Chief Complaint Abdominal Pain Reason for Call Symptomatic / Request for Health Information Initial Comment When she eats it hurts the bottom of her stomach and when she stands up she is very weak. Translation No Nurse Assessment Nurse: Redmond Pulling, RN, Levada Dy Date/Time Eilene Ghazi Time): 07/22/2021 4:32:14 PM Confirm and document reason for call. If symptomatic, describe symptoms. ---When she eats it hurts the bottom of her stomach and when she stands up she is very weak. Symptoms started last week. Took milk of magnesia due to constipation. Caller states stomach is hurting after she eats. Felt like she was going to pass out today. Denies having headache and cough. Does the patient have any new or worsening symptoms? ---Yes Will a triage be completed? ---Yes Related visit to physician within the last 2 weeks? ---No Does the PT have any chronic conditions? (i.e. diabetes, asthma, this includes High risk factors for pregnancy, etc.) ---Yes List chronic conditions. ---HTN, Chronic constipation. Hx Diverticulitis. Is this a behavioral health or substance abuse call? ---No Guidelines Guideline Title Affirmed Question Affirmed Notes Nurse Date/Time (Eastern Time) Abdominal Pain - Female [1] MILD pain (e.g., does not interfere with normal activities) AND [2] pain comes and goes (cramps) AND [3] present > 48  hours (Exception: this same Redmond Pulling, RN, Levada Dy 07/22/2021 4:36:02 PM PLEASE NOTE: All timestamps contained within this report are represented as [place] Standard Time. CONFIDENTIALTY NOTICE: This fax transmission is intended only for the addressee. It contains information that is legally privileged, confidential or otherwise protected from use or disclosure. If you are not the intended recipient, you are strictly prohibited from reviewing, disclosing, copying using or disseminating any of this information or taking any action in reliance on or regarding this information. If you have received this fax in error, please notify us immediately by telephone so that we can arrange for its return to [place]. Phone: [phone], Toll-Free: [phone], Fax: [phone] Page: 2 of 2 Call Id: [id number] Guidelines Guideline Title Affirmed Question Affirmed Notes Nurse Date/Time Eilene Ghazi Time) abdominal pain is a chronic symptom recurrent or ongoing AND present > 4 weeks) Disp. Time Eilene Ghazi Time) Disposition Final User 07/22/2021 4:50:40 PM See PCP within 24 Hours Yes Redmond Pulling, RN, Marin Shutter Disagree/Comply Comply Caller Understands Yes PreDisposition Did not know what to do Care Advice Given Per Guideline SEE PCP WITHIN 24 HOURS: * IF OFFICE WILL BE OPEN: You need to be examined within the next 24 hours. Call your doctor (or NP/PA) when the office opens and make an appointment. CRAMPS: * During cramps, drink some water, then lie down and try to find a comfortable position. DIET: * Drink adequate fluids. Eat a bland diet. CALL BACK IF: * Severe pain lasts over 1 hour * Constant pain lasts over 2 hours * You become worse CARE ADVICE given per Abdominal Pain, Female (Adult) guideline. Comments User: Ivonne Andrew, RN Date/Time Eilene Ghazi Time): 07/22/2021 4:49:18 PM Became disconnected when calling the backline for appointment for patient. Callers phone is busy.  Will keep trying to reach her. User:  Ivonne Andrew, RN Date/Time Eilene Ghazi Time): 07/22/2021 4:51:49 PM Reached patient and advised her to call her PCP office and tell them you spoke to the nurse and that you need to make an appointment for tomorrow. Referrals REFERRED TO PCP OFFIC

## 2021-07-23 NOTE — Telephone Encounter (Signed)
I will see her then  

## 2021-07-23 NOTE — Telephone Encounter (Signed)
Pt already has appt to see Dr Glori Bickers 07/23/21 at 2:30; pt care advice given at end of access note. Sending to Dr Glori Bickers and [place] CMA.

## 2021-07-23 NOTE — Patient Instructions (Signed)
Today  Urinalysis  Labs  Xray of your abdomen   We will make a plan based on that   Continue good fluids  Stool softener is ok for constipation  Miralax can help also  (it works slowly)   If symptoms suddenly worsen let me know  If you need to stay with someone please do   Be careful not to fall  Use walker and /or cane

## 2021-07-24 LAB — CBC WITH DIFFERENTIAL/PLATELET
Basophils Absolute: 0.1 10*3/uL (ref 0.0–0.1)
Basophils Relative: 0.8 % (ref 0.0–3.0)
Eosinophils Absolute: 0.2 10*3/uL (ref 0.0–0.7)
Eosinophils Relative: 1.7 % (ref 0.0–5.0)
HCT: 43.5 % (ref 36.0–46.0)
Hemoglobin: 14.3 g/dL (ref 12.0–15.0)
Lymphocytes Relative: 45.5 % (ref 12.0–46.0)
Lymphs Abs: 5.7 10*3/uL — ABNORMAL HIGH (ref 0.7–4.0)
MCHC: 32.9 g/dL (ref 30.0–36.0)
MCV: 89.2 fl (ref 78.0–100.0)
Monocytes Absolute: 1 10*3/uL (ref 0.1–1.0)
Monocytes Relative: 7.9 % (ref 3.0–12.0)
Neutro Abs: 5.5 10*3/uL (ref 1.4–7.7)
Neutrophils Relative %: 44.1 % (ref 43.0–77.0)
Platelets: 326 10*3/uL (ref 150.0–400.0)
RBC: 4.88 Mil/uL (ref 3.87–5.11)
RDW: 13.9 % (ref 11.5–15.5)
WBC: 12.4 10*3/uL — ABNORMAL HIGH (ref 4.0–10.5)

## 2021-07-24 LAB — COMPREHENSIVE METABOLIC PANEL
ALT: 10 U/L (ref 0–35)
AST: 20 U/L (ref 0–37)
Albumin: 4.7 g/dL (ref 3.5–5.2)
Alkaline Phosphatase: 67 U/L (ref 39–117)
BUN: 26 mg/dL — ABNORMAL HIGH (ref 6–23)
CO2: 34 mEq/L — ABNORMAL HIGH (ref 19–32)
Calcium: 10 mg/dL (ref 8.4–10.5)
Chloride: 99 mEq/L (ref 96–112)
Creatinine, Ser: 1.39 mg/dL — ABNORMAL HIGH (ref 0.40–1.20)
GFR: 33.43 mL/min — ABNORMAL LOW (ref >60.00)
Glucose, Bld: 108 mg/dL — ABNORMAL HIGH (ref 70–99)
Potassium: 4.5 mEq/L (ref 3.5–5.1)
Sodium: 142 mEq/L (ref 135–145)
Total Bilirubin: 0.6 mg/dL (ref 0.2–1.2)
Total Protein: 7.2 g/dL (ref 6.0–8.3)

## 2021-07-24 NOTE — Assessment & Plan Note (Signed)
Unsure if this adds to her abdominal symptoms  No blood in stool or focal tenderness Some constipation  Labs and abd xr ordered

## 2021-07-24 NOTE — Assessment & Plan Note (Signed)
ua ordered  Enc good fluids  Also has constipation

## 2021-07-24 NOTE — Assessment & Plan Note (Addendum)
Bilateral, comes and goes with constipation  Some generalized weakness and malaise She has used dulcolax  No blood in stool and non focal tenderness  Labs and abd xray ordered   Encouraged fluids  Try miralax for constipation  Pending results  inst her to call if symptoms worsen

## 2021-07-25 ENCOUNTER — Ambulatory Visit (INDEPENDENT_AMBULATORY_CARE_PROVIDER_SITE_OTHER): Payer: Medicare HMO

## 2021-07-25 ENCOUNTER — Other Ambulatory Visit: Payer: Self-pay

## 2021-07-25 DIAGNOSIS — R1030 Lower abdominal pain, unspecified: Secondary | ICD-10-CM

## 2021-07-25 DIAGNOSIS — R350 Frequency of micturition: Secondary | ICD-10-CM

## 2021-07-25 LAB — POC URINALSYSI DIPSTICK (AUTOMATED)
Bilirubin, UA: NEGATIVE
Blood, UA: NEGATIVE
Glucose, UA: NEGATIVE
Ketones, UA: NEGATIVE
Nitrite, UA: NEGATIVE
Protein, UA: NEGATIVE
Spec Grav, UA: 1.02 (ref 1.010–1.025)
Urobilinogen, UA: 0.2 E.U./dL
pH, UA: 6 (ref 5.0–8.0)

## 2021-07-25 NOTE — Addendum Note (Signed)
Addended by: Ezequiel Ganser on: 07/25/2021 02:39 PM   Modules accepted: Orders

## 2021-07-26 LAB — URINE CULTURE
MICRO NUMBER:: 12565791
SPECIMEN QUALITY:: ADEQUATE

## 2021-07-28 ENCOUNTER — Telehealth: Payer: Self-pay | Admitting: Family Medicine

## 2021-07-28 ENCOUNTER — Other Ambulatory Visit: Payer: Self-pay | Admitting: *Deleted

## 2021-07-28 MED ORDER — FUROSEMIDE 20 MG PO TABS: ORAL_TABLET | ORAL | 1 refills | Status: DC

## 2021-07-28 NOTE — Telephone Encounter (Signed)
Pt returning call

## 2021-07-29 NOTE — Telephone Encounter (Signed)
Addressed through result notes  

## 2021-10-06 ENCOUNTER — Ambulatory Visit: Payer: Medicare HMO | Admitting: Family Medicine

## 2021-10-17 ENCOUNTER — Emergency Department (HOSPITAL_BASED_OUTPATIENT_CLINIC_OR_DEPARTMENT_OTHER)
Admission: EM | Admit: 2021-10-17 | Discharge: 2021-10-17 | Disposition: A | Discharge status: Home / Self Care | Payer: Medicare HMO | Attending: Emergency Medicine | Admitting: Emergency Medicine

## 2021-10-17 ENCOUNTER — Other Ambulatory Visit: Payer: Self-pay

## 2021-10-17 ENCOUNTER — Encounter (HOSPITAL_BASED_OUTPATIENT_CLINIC_OR_DEPARTMENT_OTHER): Payer: Self-pay

## 2021-10-17 ENCOUNTER — Emergency Department (HOSPITAL_BASED_OUTPATIENT_CLINIC_OR_DEPARTMENT_OTHER): Payer: Medicare HMO

## 2021-10-17 DIAGNOSIS — R42 Dizziness and giddiness: Secondary | ICD-10-CM | POA: Insufficient documentation

## 2021-10-17 DIAGNOSIS — I10 Essential (primary) hypertension: Secondary | ICD-10-CM | POA: Diagnosis not present

## 2021-10-17 DIAGNOSIS — Z7982 Long term (current) use of aspirin: Secondary | ICD-10-CM | POA: Diagnosis not present

## 2021-10-17 DIAGNOSIS — R0609 Other forms of dyspnea: Secondary | ICD-10-CM | POA: Insufficient documentation

## 2021-10-17 DIAGNOSIS — Z85828 Personal history of other malignant neoplasm of skin: Secondary | ICD-10-CM | POA: Diagnosis not present

## 2021-10-17 DIAGNOSIS — R55 Syncope and collapse: Secondary | ICD-10-CM | POA: Insufficient documentation

## 2021-10-17 DIAGNOSIS — Z79899 Other long term (current) drug therapy: Secondary | ICD-10-CM | POA: Insufficient documentation

## 2021-10-17 DIAGNOSIS — R6889 Other general symptoms and signs: Secondary | ICD-10-CM | POA: Diagnosis not present

## 2021-10-17 DIAGNOSIS — R531 Weakness: Secondary | ICD-10-CM | POA: Diagnosis not present

## 2021-10-17 DIAGNOSIS — Z743 Need for continuous supervision: Secondary | ICD-10-CM | POA: Diagnosis not present

## 2021-10-17 DIAGNOSIS — J9811 Atelectasis: Secondary | ICD-10-CM | POA: Diagnosis not present

## 2021-10-17 DIAGNOSIS — Z20822 Contact with and (suspected) exposure to covid-19: Secondary | ICD-10-CM | POA: Insufficient documentation

## 2021-10-17 DIAGNOSIS — R404 Transient alteration of awareness: Secondary | ICD-10-CM | POA: Diagnosis not present

## 2021-10-17 LAB — BRAIN NATRIURETIC PEPTIDE: B Natriuretic Peptide: 141 pg/mL — ABNORMAL HIGH (ref 0.0–100.0)

## 2021-10-17 LAB — BASIC METABOLIC PANEL
Anion gap: 11 (ref 5–15)
BUN: 25 mg/dL — ABNORMAL HIGH (ref 8–23)
CO2: 29 mmol/L (ref 22–32)
Calcium: 9.6 mg/dL (ref 8.9–10.3)
Chloride: 102 mmol/L (ref 98–111)
Creatinine, Ser: 0.96 mg/dL (ref 0.44–1.00)
GFR, Estimated: 56 mL/min — ABNORMAL LOW (ref >60)
Glucose, Bld: 97 mg/dL (ref 70–99)
Potassium: 3.5 mmol/L (ref 3.5–5.1)
Sodium: 142 mmol/L (ref 135–145)

## 2021-10-17 LAB — URINALYSIS, ROUTINE W REFLEX MICROSCOPIC
Bilirubin Urine: NEGATIVE
Glucose, UA: NEGATIVE mg/dL
Hgb urine dipstick: NEGATIVE
Ketones, ur: NEGATIVE mg/dL
Leukocytes,Ua: NEGATIVE
Nitrite: NEGATIVE
Protein, ur: NEGATIVE mg/dL
Specific Gravity, Urine: 1.007 (ref 1.005–1.030)
pH: 6 (ref 5.0–8.0)

## 2021-10-17 LAB — CBC
HCT: 44.5 % (ref 36.0–46.0)
Hemoglobin: 14.7 g/dL (ref 12.0–15.0)
MCH: 28.9 pg (ref 26.0–34.0)
MCHC: 33 g/dL (ref 30.0–36.0)
MCV: 87.6 fL (ref 80.0–100.0)
Platelets: 318 10*3/uL (ref 150–400)
RBC: 5.08 MIL/uL (ref 3.87–5.11)
RDW: 12.7 % (ref 11.5–15.5)
WBC: 10.6 10*3/uL — ABNORMAL HIGH (ref 4.0–10.5)
nRBC: 0 % (ref 0.0–0.2)

## 2021-10-17 LAB — TSH: TSH: 3.442 u[IU]/mL (ref 0.350–4.500)

## 2021-10-17 LAB — RESP PANEL BY RT-PCR (FLU A&B, COVID) ARPGX2
Influenza A by PCR: NEGATIVE
Influenza B by PCR: NEGATIVE
SARS Coronavirus 2 by RT PCR: NEGATIVE

## 2021-10-17 LAB — CBG MONITORING, ED: Glucose-Capillary: 86 mg/dL (ref 70–99)

## 2021-10-17 LAB — TROPONIN I (HIGH SENSITIVITY)
Troponin I (High Sensitivity): 16 ng/L (ref <18)
Troponin I (High Sensitivity): 18 ng/L — ABNORMAL HIGH (ref <18)

## 2021-10-17 NOTE — ED Triage Notes (Addendum)
Pt states after she had breakfast she felt like she was "going to pass out." Pt states she felt weak all over. Pt states these episodes happen all the time, but they have been happening more frequently. Pt denies unilateral weakness. Pt A/Ox4 in NAD.

## 2021-10-17 NOTE — ED Provider Notes (Signed)
[facility] EMERGENC[facility] DEPT Provider Note   CSN: [id number] Arrival date & time: 10/17/21  1134     History  Chief Complaint  Patient presents with   Near Syncope    Linda Robinson is a 88 y.o. female.   Near Syncope Pertinent negatives include no chest pain, no abdominal pain, no headaches and no shortness of breath.   Patient with medical history of chronic back pain, MDD, vertigo takes Antivert daily, hypertension, hyperlipidemia presents due to presyncope.  Patient reports she has been having episodes of presyncope off and on for the last 3 years.  She usually notices them in the morning when she goes from standing to sitting, they are never associated with chest pain or shortness of breath.  She feels lightheaded but not like the room is spinning.  Mostly last an hour, it alleviates on its own.  Reports she is here today because over the last few months these episodes have been happening more frequently.  She is having episodes anywhere between 2-4 times a week.  Reports she is eating and drinking well at home, no recent changes in medicine.  She has been experimenting by taking her home meds some in the morning and some in the evening but has not noticed any changes to her symptoms.  She fell a week ago, has not had any headache or confusion.  No nausea or vomiting, no confusion or lateralizing symptoms.  Ambulatory at baseline.  Denies any pain anywhere, no dysuria or hematuria.  Denies any vision changes, headache.  Past Medical History:  Diagnosis Date   Ankylosis of lumbar spine    Basal cell carcinoma    removed   Cervical spine pain    Chest pain    a. 02/2017 MV: EF 54%, small, distal anterior wall/apical infacrt (likely misregistration), no ischemia-->Low risk; b. 02/2017 Echo: EF 55-60%, mild MR, PASP 69mmHg.   Chronic back pain    stenosis   Constipation    takes Colace daily   Depression    takes Zoloft daily   Diverticulosis of colon    GERD  (gastroesophageal reflux disease)    atypical chest pain   Gout    hx of;was on Allopurinol but taken off 6 wks ago by medical MD   History of diverticulitis of colon    HLD (hyperlipidemia)    takes Pravastatin daily   HTN (hypertension)    takes Amlodipine and carvedilol   Lumbar spondylosis with myelopathy    Numbness    OA (osteoarthritis)    right knee   Osteopenia    takes Calcium and Vit D daily   Scoliosis    Vertigo    takes Antivert daily as needed     Home Medications Prior to Admission medications   Medication Sig Start Date End Date Taking? Authorizing Provider  amLODipine (NORVASC) 10 MG tablet TAKE 1 TABLET DAILY 06/10/21   Tower, Wynelle Fanny, MD  aspirin EC 81 MG tablet Take 1 tablet (81 mg total) by mouth daily. 03/01/17   Wellington Hampshire, MD  Calcium Carbonate-Vitamin D 500-125 MG-UNIT TABS Take by mouth.    [provider]  Cranberry 250 MG CAPS Take 1 capsule by mouth daily.    [provider]  docusate sodium (COLACE) 100 MG capsule Take 1-2 capsules (100-200 mg total) by mouth 2 (two) times daily. 100 mg in the morning and 200 mg at night 12/28/19   Tower, Wynelle Fanny, MD  famotidine (PEPCID) 20 MG  tablet TAKE 1 TABLET TWICE A DAY 05/19/21   Tower, Wynelle Fanny, MD  fish oil-omega-3 fatty acids 1000 MG capsule Take 1 g by mouth at bedtime.    [provider]  furosemide (LASIX) 20 MG tablet TAKE 1 TABLET DAILY AS NEEDED FOR SWELLING 07/28/21   Tower, Wynelle Fanny, MD  hydrALAZINE (APRESOLINE) 10 MG tablet Take 1 tablet (10 mg total) by mouth in the morning and at bedtime. 06/30/21   Tower, Wynelle Fanny, MD  meclizine (ANTIVERT) 25 MG tablet Take 25 mg by mouth 4 (four) times daily as needed for dizziness.    [provider]  omeprazole (PRILOSEC) 20 MG capsule TAKE 1 CAPSULE DAILY 06/10/21   Tower, [place] A, MD  pravastatin (PRAVACHOL) 20 MG tablet TAKE 1 TABLET DAILY 06/10/21   Tower, Wynelle Fanny, MD  sertraline (ZOLOFT) 100 MG tablet TAKE 1 TABLET DAILY  06/10/21   Tower, Wynelle Fanny, MD  Tetrahydrozoline HCl (EYE DROPS OP) Apply 1-2 drops to eye as needed (for dry eye).    [provider]      Allergies    Ace inhibitors, Bactrim [sulfamethoxazole-trimethoprim], Cephalosporins, Colesevelam, Hctz [hydrochlorothiazide], Losartan, Lovastatin, Lyrica [pregabalin], Raloxifene, Rosuvastatin, and Simvastatin    Review of Systems   Review of Systems  Constitutional:  Negative for fever.  Eyes:  Negative for photophobia.  Respiratory:  Negative for cough and shortness of breath.   Cardiovascular:  Positive for near-syncope. Negative for chest pain.  Gastrointestinal:  Negative for abdominal pain, constipation, diarrhea, nausea and vomiting.  Genitourinary:  Negative for dysuria.  Musculoskeletal:  Positive for back pain.       (Chronic)  Neurological:  Positive for dizziness and light-headedness. Negative for tremors, seizures, syncope, facial asymmetry, speech difficulty, weakness and headaches.  Psychiatric/Behavioral:  Negative for confusion.    Physical Exam Updated Vital Signs BP (!) 187/93    Pulse 68    Temp 97.8 F (36.6 C)    Resp 15    Ht 5\' 2"  (1.575 m)    Wt 58.5 kg    SpO2 97%    BMI 23.59 kg/m  Physical Exam Vitals and nursing note reviewed. Exam conducted with a chaperone present.  Constitutional:      Appearance: Normal appearance.  HENT:     Head: Normocephalic and atraumatic.  Eyes:     General: No scleral icterus.       Right eye: No discharge.        Left eye: No discharge.     Extraocular Movements: Extraocular movements intact.     Pupils: Pupils are equal, round, and reactive to light.  Cardiovascular:     Rate and Rhythm: Normal rate and regular rhythm.     Pulses: Normal pulses.     Heart sounds: Normal heart sounds. No murmur heard.   No friction rub. No gallop.  Pulmonary:     Effort: Pulmonary effort is normal. No respiratory distress.     Breath sounds: Normal breath sounds.     Comments: Lungs  clear to auscultation bilaterally Abdominal:     General: Abdomen is flat. Bowel sounds are normal. There is no distension.     Palpations: Abdomen is soft.     Tenderness: There is no abdominal tenderness.     Comments: Abdomen is soft without any tenderness or guarding  Musculoskeletal:        General: No tenderness.  Skin:    General: Skin is warm and dry.     Capillary  Refill: Capillary refill takes less than 2 seconds.     Coloration: Skin is not jaundiced.  Neurological:     Mental Status: She is alert. Mental status is at baseline.     Coordination: Coordination normal.     Comments: Oriented x3.  No dysarthria, cranial nerves III through XII are grossly intact.  Grip strength equal bilaterally, able to raise both lower extremities.  No nystagmus.   ED Results / Procedures / Treatments   Labs (all labs ordered are listed, but only abnormal results are displayed) Labs Reviewed  BASIC METABOLIC PANEL - Abnormal; Notable for the following components:      Result Value   BUN 25 (*)    GFR, Estimated 56 (*)    All other components within normal limits  CBC - Abnormal; Notable for the following components:   WBC 10.6 (*)    All other components within normal limits  URINALYSIS, ROUTINE W REFLEX MICROSCOPIC - Abnormal; Notable for the following components:   Color, Urine COLORLESS (*)    All other components within normal limits  BRAIN NATRIURETIC PEPTIDE - Abnormal; Notable for the following components:   B Natriuretic Peptide 141.0 (*)    All other components within normal limits  TROPONIN I (HIGH SENSITIVITY) - Abnormal; Notable for the following components:   Troponin I (High Sensitivity) 18 (*)    All other components within normal limits  RESP PANEL BY RT-PCR (FLU A&B, COVID) ARPGX2  TSH  CBG MONITORING, ED  TROPONIN I (HIGH SENSITIVITY)    EKG None  Radiology DG Chest Portable 1 View  Result Date: 10/17/2021 CLINICAL DATA:  Syncope, hypertension EXAM: PORTABLE  CHEST 1 VIEW COMPARISON:  Portable exam 1523 hours compared to 03/04/2017 FINDINGS: Normal heart size, mediastinal contours, and pulmonary vascularity. Atherosclerotic calcification aorta. Mild LEFT basilar atelectasis. Lungs otherwise clear. No pulmonary infiltrate, pleural effusion, or pneumothorax. Prior spinal fusion procedures x2. IMPRESSION: Mild LEFT basilar atelectasis. Aortic Atherosclerosis (ICD10-I70.0). Electronically Signed   By: Lavonia Dana M.D.   On: 10/17/2021 15:31    Procedures Procedures    Medications Ordered in ED Medications - No data to display  ED Course/ Medical Decision Making/ A&P    Patient with medical history of chronic back pain, MDD, vertigo takes Antivert daily, hypertension, hyperlipidemia presents due to presyncope.  She is hypertensive but vital signs otherwise remained stable.  No bradycardia, heart rate is regular without any signs of atrial fibrillation, bradycardia.  Radial pulse 2+ equal bilaterally, cranial nerves II through XII are grossly intact.  Follows commands, oriented x3 and does not appear confused.  No lateralizing symptoms concerning for CVA or TIA.  Broad work-up for presyncope given broad differential to include cardiac etiology, CVA/TIA, vertigo, infection, orthostatic changes   Additional history obtained: -Additional history obtained from patient son who is at bedside. -External records from outside source obtained and reviewed including: Chart review including previous notes, labs, imaging, consultation notes   Lab Tests: -I ordered, reviewed, and interpreted labs.  The pertinent results include:   Patient is COVID and flu negative.  There is no gross electrolyte derangement, no AKI.  CBC shows a slight leukocytosis at 10.9 but no underlying anemia.  UA unremarkable, no evidence of UTI.  TSH ordered due to patient request, I do not believe it is contributory.  No hypoglycemia noted, BNP does not show any acute exacerbation concerning  for new CHF or increased rate on the heart.  Troponin ordered, delta troponin 2.  EKG  without any ischemic findings, I do not suspect this is ACS.   EKG -Normal sinus rhythm.  No ST elevation or depression concerning for ischemic findings.  No underlying arrhythmia, no heart blocks or bradycardia   Imaging Studies ordered: -I ordered imaging studies including chest x-ray -I independently visualized and interpreted imaging which showed left atelectasis but no evidence of pneumonia, pneumothorax, widened mediastinum -I agree with the radiologist interpretation   ED Course: Patient remained symptom-free while in the ED.  Do not feel she needs additional work-up at this time.  I do suspect this may be slightly related to orthostatics, already has an underlying history of vertigo.  Given no lateralizing symptoms I do not have a high suspicion this is TIA or CVA related.  Especially in the context of the chronic nature of this.  Doubt ACS, PE, UTI, PNA, CHF exacerbation. Doubt acute emergent process at this time. Do not feel she needs admission for additional workup.    Disposition: Follow-up with PCP for additional evaluation this week.   Discussed HPI, physical exam and plan of care for this patient with attending Dr. Campbell Stall. The attending physician evaluated this patient as part of a shared visit and agrees with plan of care.          Final Clinical Impression(s) / ED Diagnoses Final diagnoses:  Intermittent lightheadedness    Rx / DC Orders ED Discharge Orders     None         Sherrill Raring, PA-C 75/10/25 [id number]    Campbell Stall P, DO 78/24/23 1200

## 2021-10-17 NOTE — Discharge Instructions (Signed)
Your work-up today was reassuring, I do not see any abnormal findings to explain the lightheadedness.  I suspect it may be related to position, make sure she drinks plenty of water.  Continue taking your medicine at home as prescribed.  Please follow-up with your primary care doctor next week, go to your medication list and see if there is any medicine that may be contributing to the dizziness.  Check MyChart for the result of the TSH

## 2021-10-17 NOTE — ED Triage Notes (Signed)
Pt brought in by ems from home due to generalized weakness for 3 days.  Pt has had similar symptoms intermittently for "years".  Pt is alert and oriented and lives independently at home.  Pt has 20g in RAC.

## 2021-10-17 NOTE — ED Notes (Signed)
When Pt was standing for Orthostatics she was more weak than her normal but Pt stated she was not Dizzy

## 2021-10-21 ENCOUNTER — Ambulatory Visit (INDEPENDENT_AMBULATORY_CARE_PROVIDER_SITE_OTHER): Payer: Medicare HMO | Admitting: Family Medicine

## 2021-10-21 ENCOUNTER — Encounter: Payer: Self-pay | Admitting: Family Medicine

## 2021-10-21 ENCOUNTER — Other Ambulatory Visit: Payer: Self-pay

## 2021-10-21 VITALS — BP 136/80 | HR 77 | Temp 97.4°F | Ht 60.0 in | Wt 135.0 lb

## 2021-10-21 DIAGNOSIS — F418 Other specified anxiety disorders: Secondary | ICD-10-CM

## 2021-10-21 DIAGNOSIS — R634 Abnormal weight loss: Secondary | ICD-10-CM | POA: Diagnosis not present

## 2021-10-21 DIAGNOSIS — I10 Essential (primary) hypertension: Secondary | ICD-10-CM

## 2021-10-21 DIAGNOSIS — M792 Neuralgia and neuritis, unspecified: Secondary | ICD-10-CM

## 2021-10-21 DIAGNOSIS — R531 Weakness: Secondary | ICD-10-CM | POA: Diagnosis not present

## 2021-10-21 DIAGNOSIS — R69 Illness, unspecified: Secondary | ICD-10-CM | POA: Diagnosis not present

## 2021-10-21 NOTE — Assessment & Plan Note (Signed)
Stressors continue and pt is lonely  Reviewed stressors/ coping techniques/symptoms/ support sources/ tx options and side effects in detail today  Enc her to consider moving in with family /friend or having someone move in with her  May try and visit sister with dementia more often  Due to mid day weak feeling-will move her sertraline 100 mg to bed time  Pleasant and mentally sharp today

## 2021-10-21 NOTE — Assessment & Plan Note (Signed)
Pt experiences a weak spell (no dizziness, just weak) mid day after taking am meds despite regular meals  Seen in ER on 1/20  Reviewed hospital records, lab results and studies in detail  -re assuring w/u except for elevated bp likely due to anxiety  Disc stressors, as well as diet and fluid intake  She improved after holding amlodipine for 2 d  ? As to whether she may get hypotensive or orthostatic at home (she did fall last wk but thinks balance is fairly good) Will try holding amlodipine with bp monitoring at home and update next week (consider 5 mg in evening if needed) Would like to also try holding omeprazole to see if she needs it  Discussed imp of self care Wonder if PT may help in future when feeling better  F/u in 1-2 mo

## 2021-10-21 NOTE — Progress Notes (Signed)
Subjective:    Patient ID: Linda Robinson, female    DOB: 03/05/1929, 88 y.o.   MRN: [id number]  This visit occurred during the SARS-CoV-2 public health emergency.  Safety protocols were in place, including screening questions prior to the visit, additional usage of staff PPE, and extensive cleaning of exam room while observing appropriate contact time as indicated for disinfecting solutions.   HPI Pt presents for f/u of ER visit for light headedness in setting of stress  Wt Readings from Last 3 Encounters:  10/21/21 135 lb (61.2 kg)  10/17/21 129 lb (58.5 kg)  07/23/21 133 lb (60.3 kg)   26.37 kg/m  She was seen in ER for near syncope on 1/20 Dizzy episodes have been episodic for 3 years and usually when she changes position  Of note: does have h/o vertigo in the past  These have increased to 2-4 times per week  She changed her med times to see if this changes anything  Had a fall a week prior (no injury)  No n/v/spinning confusion or stroke symptoms  No CHF symptoms   Per pt -symptoms happen mid am  Feels really weak almost like she could pass out  Usually when she sits in her chair  Last week she fell- had to call family to get her up (they called EMS)  No LOC, did not hurt herself just fell due to weakness  She wonders if it could be her medicine  Per pt -not dizzy actually (just weak)  She has fainted easily in the past (not lately)   Occ mild headache/not often and very slight /pays no attention   At home her bp is ok -usually 130s-140s/60-80  If anxious it can go higher   Has had cardiac w/u in 2018 that was negative    In ER she was hypertensive but otherwise nl vitals  Work up: Covid and flu negative Slt leukocytosis  EKG -no ischemic findings   Vitals today  BP Readings from Last 3 Encounters:  10/21/21 136/80  10/17/21 (!) 187/93  07/23/21 138/72   Pulse Readings from Last 3 Encounters:  10/21/21 77  10/17/21 68  07/23/21 74   Takes  amlodipine- usually in the am , a few days last week she skipped it (felt better without it)  Then started taking it at lunch  Lasix takes in the am (did not take today due to urinary freq) Hydralazine twice daily  Eats regularly-even if she is not hungry  [employer] brings meals Drinks water-needs to drink more   Pepcid am and pm  Omeprazole in am  Stool softener bid   Pravastatin in pm   Sertraline - took in am , now takes at lunch  Meclizine prn- has not taken in a long time   Results for orders placed or performed during the hospital encounter of 10/17/21  Resp Panel by RT-PCR (Flu A&B, Covid) Nasopharyngeal Swab   Specimen: Nasopharyngeal Swab; Nasopharyngeal(NP) swabs in vial transport medium  Result Value Ref Range   SARS Coronavirus 2 by RT PCR NEGATIVE NEGATIVE   Influenza A by PCR NEGATIVE NEGATIVE   Influenza B by PCR NEGATIVE NEGATIVE  Basic metabolic panel  Result Value Ref Range   Sodium 142 135 - 145 mmol/L   Potassium 3.5 3.5 - 5.1 mmol/L   Chloride 102 98 - 111 mmol/L   CO2 29 22 - 32 mmol/L   Glucose, Bld 97 70 - 99 mg/dL   BUN 25 (H) 8 -  23 mg/dL   Creatinine, Ser 0.96 0.44 - 1.00 mg/dL   Calcium 9.6 8.9 - 10.3 mg/dL   GFR, Estimated 56 (L) >60 mL/min   Anion gap 11 5 - 15  CBC  Result Value Ref Range   WBC 10.6 (H) 4.0 - 10.5 K/uL   RBC 5.08 3.87 - 5.11 MIL/uL   Hemoglobin 14.7 12.0 - 15.0 g/dL   HCT 44.5 36.0 - 46.0 %   MCV 87.6 80.0 - 100.0 fL   MCH 28.9 26.0 - 34.0 pg   MCHC 33.0 30.0 - 36.0 g/dL   RDW 12.7 11.5 - 15.5 %   Platelets 318 150 - 400 K/uL   nRBC 0.0 0.0 - 0.2 %  Urinalysis, Routine w reflex microscopic Urine, Clean Catch  Result Value Ref Range   Color, Urine COLORLESS (A) YELLOW   APPearance CLEAR CLEAR   Specific Gravity, Urine 1.007 1.005 - 1.030   pH 6.0 5.0 - 8.0   Glucose, UA NEGATIVE NEGATIVE mg/dL   Hgb urine dipstick NEGATIVE NEGATIVE   Bilirubin Urine NEGATIVE NEGATIVE   Ketones, ur NEGATIVE NEGATIVE mg/dL    Protein, ur NEGATIVE NEGATIVE mg/dL   Nitrite NEGATIVE NEGATIVE   Leukocytes,Ua NEGATIVE NEGATIVE  Brain natriuretic peptide  Result Value Ref Range   B Natriuretic Peptide 141.0 (H) 0.0 - 100.0 pg/mL  TSH  Result Value Ref Range   TSH 3.442 0.350 - 4.500 uIU/mL  CBG monitoring, ED  Result Value Ref Range   Glucose-Capillary 86 70 - 99 mg/dL  Troponin I (High Sensitivity)  Result Value Ref Range   Troponin I (High Sensitivity) 16 <18 ng/L  Troponin I (High Sensitivity)  Result Value Ref Range   Troponin I (High Sensitivity) 18 (H) <18 ng/L    DG Chest Portable 1 View  Result Date: 10/17/2021 CLINICAL DATA:  Syncope, hypertension EXAM: PORTABLE CHEST 1 VIEW COMPARISON:  Portable exam 1523 hours compared to 03/04/2017 FINDINGS: Normal heart size, mediastinal contours, and pulmonary vascularity. Atherosclerotic calcification aorta. Mild LEFT basilar atelectasis. Lungs otherwise clear. No pulmonary infiltrate, pleural effusion, or pneumothorax. Prior spinal fusion procedures x2. IMPRESSION: Mild LEFT basilar atelectasis. Aortic Atherosclerosis (ICD10-I70.0). Electronically Signed   By: Lavonia Dana M.D.   On: 10/17/2021 15:31    Takes zoloft 100 mg daily for anxiety with depression   stress 2 sons in hospital  [place] child covid and permanent lung damage Living alone is very hard/grief from loss of husband  Son lives next door  She rides golf cart   Friends call  Afraid to go anywhere (or she may fall) Needs go get out more  Not outdoors   L hand -does not work well  Still a lot of pain   Patient Active Problem List   Diagnosis Date Noted   Lower abdominal pain 07/23/2021   Frequency of urination 07/23/2021   Diverticulosis 07/23/2021   Cervical radicular pain (left) 09/12/2020   Cervical disc disorder with radiculopathy of cervical region 09/12/2020   Cervical spondylosis 09/12/2020   Cervical fusion syndrome (C4-T1 ACDF) 09/12/2020   Neuropathic pain of hand, left  09/12/2020   Chronic pain syndrome 09/12/2020   Failed neck syndrome 07/30/2020   Left hand pain 07/30/2020   Generalized weakness 05/01/2020   Grief reaction 11/17/2019   Cervical stenosis of spinal canal 11/17/2019   Hearing loss 11/17/2019   Poor balance 06/12/2019   Screening mammogram, encounter for 05/23/2018   Substernal chest pain 08/16/2017   Atherosclerosis of aorta ([place]) 03/09/2017  Weight loss 02/23/2017   Routine general medical examination at a health care facility 07/05/2015   Estrogen deficiency 07/05/2015   Prediabetes 01/01/2015   Lumbar spinal stenosis 11/13/2014   Vertigo 10/05/2014   Risk for falls 06/27/2014   Low back pain on right side with sciatica 07/24/2013   Edema 04/26/2013   Encounter for Medicare annual wellness exam 04/11/2013   GERD (gastroesophageal reflux disease)    Osteopenia 01/30/2009   CONSTIPATION 11/01/2007   Depression with anxiety 02/14/2007   Hyperlipidemia LDL goal <130 02/04/2007   Gout 02/04/2007   Essential hypertension 02/04/2007   DIVERTICULOSIS, COLON 02/04/2007   Renal insufficiency 02/04/2007   OSTEOARTHRITIS 02/04/2007   DEGENERATIVE [place] DISEASE 02/04/2007   Past Medical History:  Diagnosis Date   Ankylosis of lumbar spine    Basal cell carcinoma    removed   Cervical spine pain    Chest pain    a. 02/2017 MV: EF 54%, small, distal anterior wall/apical infacrt (likely misregistration), no ischemia-->Low risk; b. 02/2017 Echo: EF 55-60%, mild MR, PASP 52mmHg.   Chronic back pain    stenosis   Constipation    takes Colace daily   Depression    takes Zoloft daily   Diverticulosis of colon    GERD (gastroesophageal reflux disease)    atypical chest pain   Gout    hx of;was on Allopurinol but taken off 6 wks ago by medical MD   History of diverticulitis of colon    HLD (hyperlipidemia)    takes Pravastatin daily   HTN (hypertension)    takes Amlodipine and carvedilol   Lumbar spondylosis with myelopathy     Numbness    OA (osteoarthritis)    right knee   Osteopenia    takes Calcium and Vit D daily   Scoliosis    Vertigo    takes Antivert daily as needed   Past Surgical History:  Procedure Laterality Date   APPENDECTOMY  at age 75   [place] Right 04/2006   CHOLECYSTECTOMY  1970   COLONOSCOPY     KNEE SURGERY Right    Medial meniscus tear   LUMBAR LAMINECTOMY/DECOMPRESSION MICRODISCECTOMY Bilateral 11/13/2014   Procedure: Lumbar one-two Bilateral laminectomy and foraminotomy, Left Lumbar one-two microdiscectomy ;  Surgeon: Faythe Ghee, MD;  Location: [facility];  Service: Neurosurgery;  Laterality: Bilateral;   NECK SURGERY  [phone]   x 3; fusions   PARTIAL COLECTOMY  09/1999   d/t diverticulitis   TUBAL LIGATION     Social History   Tobacco Use   Smoking status: Never   Smokeless tobacco: Never  Vaping Use   Vaping Use: Never used  Substance Use Topics   Alcohol use: Never    Alcohol/week: 0.0 standard drinks   Drug use: Never   Family History  Problem Relation Age of Onset   Cancer Father        throat and bladder   Diabetes Father    Hypertension Father    Heart attack Father    Stroke Mother    Alzheimer's disease Mother    Hypertension Mother    Allergies  Allergen Reactions   Ace Inhibitors     REACTION: increased K+, increased creat.   Bactrim [Sulfamethoxazole-Trimethoprim]     rash   Cephalosporins     REACTION: reaction not known   Colesevelam     REACTION: constipation   Hctz [Hydrochlorothiazide]     Makes her feel "  bad"   Losartan     Increased K   Lovastatin     REACTION: doesn't work   Lyrica [Pregabalin]     Felt bad    Raloxifene     REACTION: cramps   Rosuvastatin     REACTION: myalgia   Simvastatin     REACTION: myalgia   Current Outpatient Medications on File Prior to Visit  Medication Sig Dispense Refill   aspirin EC 81 MG tablet Take 1 tablet (81 mg total) by mouth daily. 90 tablet 3    Calcium Carbonate-Vitamin D 500-125 MG-UNIT TABS Take by mouth.     Cranberry 250 MG CAPS Take 1 capsule by mouth daily.     docusate sodium (COLACE) 100 MG capsule Take 1-2 capsules (100-200 mg total) by mouth 2 (two) times daily. 100 mg in the morning and 200 mg at night 180 capsule 1   famotidine (PEPCID) 20 MG tablet TAKE 1 TABLET TWICE A DAY 180 tablet 3   fish oil-omega-3 fatty acids 1000 MG capsule Take 1 g by mouth at bedtime.     furosemide (LASIX) 20 MG tablet TAKE 1 TABLET DAILY AS NEEDED FOR SWELLING 90 tablet 1   hydrALAZINE (APRESOLINE) 10 MG tablet Take 1 tablet (10 mg total) by mouth in the morning and at bedtime. 180 tablet 2   meclizine (ANTIVERT) 25 MG tablet Take 25 mg by mouth 4 (four) times daily as needed for dizziness.     pravastatin (PRAVACHOL) 20 MG tablet TAKE 1 TABLET DAILY 90 tablet 3   sertraline (ZOLOFT) 100 MG tablet TAKE 1 TABLET DAILY (Patient taking differently: at bedtime.) 90 tablet 3   Tetrahydrozoline HCl (EYE DROPS OP) Apply 1-2 drops to eye as needed (for dry eye).     No current facility-administered medications on file prior to visit.     Review of Systems  Constitutional:  Negative for activity change, appetite change, fatigue, fever and unexpected weight change.  HENT:  Negative for congestion, ear pain, rhinorrhea, sinus pressure and sore throat.   Eyes:  Negative for pain, redness and visual disturbance.  Respiratory:  Negative for cough, shortness of breath and wheezing.   Cardiovascular:  Negative for chest pain and palpitations.  Gastrointestinal:  Negative for abdominal pain, blood in stool, constipation and diarrhea.  Endocrine: Negative for polydipsia and polyuria.  Genitourinary:  Negative for dysuria, frequency and urgency.  Musculoskeletal:  Negative for arthralgias, back pain and myalgias.  Skin:  Negative for pallor and rash.  Allergic/Immunologic: Negative for environmental allergies.  Neurological:  Negative for dizziness,  tremors, syncope and headaches.       R hand radicular pain and weakness and tingling/pain  Generalized weakness (no new focal weakness)  Hematological:  Negative for adenopathy. Does not bruise/bleed easily.  Psychiatric/Behavioral:  Negative for decreased concentration, dysphoric mood and suicidal ideas. The patient is nervous/anxious.       Objective:   Physical Exam Constitutional:      General: She is not in acute distress.    Appearance: Normal appearance. She is well-developed and normal weight. She is not ill-appearing or diaphoretic.  HENT:     Head: Normocephalic and atraumatic.     Ears:     Comments: Hearing impaired Eyes:     General: No scleral icterus.       Right eye: No discharge.        Left eye: No discharge.     Conjunctiva/sclera: Conjunctivae normal.     Pupils: Pupils  are equal, round, and reactive to light.  Neck:     Thyroid: No thyromegaly.     Vascular: No carotid bruit or JVD.  Cardiovascular:     Rate and Rhythm: Normal rate and regular rhythm.     Heart sounds: Normal heart sounds.    No gallop.  Pulmonary:     Effort: Pulmonary effort is normal. No respiratory distress.     Breath sounds: Normal breath sounds. No wheezing or rales.  Abdominal:     General: Bowel sounds are normal. There is no distension or abdominal bruit.     Palpations: Abdomen is soft. There is no mass.     Tenderness: There is no abdominal tenderness.  Musculoskeletal:     Cervical back: Normal range of motion and neck supple.     Right lower leg: No edema.     Left lower leg: No edema.  Lymphadenopathy:     Cervical: No cervical adenopathy.  Skin:    General: Skin is warm and dry.     Coloration: Skin is not jaundiced or pale.     Findings: No rash.     Comments: Bruise from prior blood draw L arm  Neurological:     Mental Status: She is alert.     Cranial Nerves: No cranial nerve deficit.     Motor: Weakness present.     Coordination: Coordination normal.      Deep Tendon Reflexes: Reflexes are normal and symmetric. Reflexes normal.     Comments: Baseline weakness/imp sensation of L hand  No other focal weakness  Can rise from chair w/o asst using cane    Psychiatric:        Mood and Affect: Mood is anxious.        Cognition and Memory: Cognition and memory normal.     Comments: Mildly anxious  Occ tearful  Mentally sharp Talkative           Assessment & Plan:   Problem List Items Addressed This Visit       Cardiovascular and Mediastinum   Essential hypertension (Chronic)    Pt c/o weak spells (one fall) mid day and was seen in ER with reassuring w/u , bp was high but there was a ? As to whether low at home or orthostatic  Possible she may have amlodpine side effect because skipping it for 2 days helped  BP: 136/80  Has not taken lasix today  Takes hydralazine 10 mg bid  Amlodipine is generally 10 mg in am   Adv to hold amlodpine again and report back next wk with bp readings (may consider 5 mg in pm if bp is up a lot) Reviewed correct way to check bp           Other   Depression with anxiety    Stressors continue and pt is lonely  Reviewed stressors/ coping techniques/symptoms/ support sources/ tx options and side effects in detail today  Enc her to consider moving in with family /friend or having someone move in with her  May try and visit sister with dementia more often  Due to mid day weak feeling-will move her sertraline 100 mg to bed time  Pleasant and mentally sharp today      Weight loss    Wt is up to 135 lb today- very reassuring      Generalized weakness - Primary    Pt experiences a weak spell (no dizziness, just weak) mid day after taking am  meds despite regular meals  Seen in ER on 1/20  Reviewed hospital records, lab results and studies in detail  -re assuring w/u except for elevated bp likely due to anxiety  Disc stressors, as well as diet and fluid intake  She improved after holding amlodipine for  2 d  ? As to whether she may get hypotensive or orthostatic at home (she did fall last wk but thinks balance is fairly good) Will try holding amlodipine with bp monitoring at home and update next week (consider 5 mg in evening if needed) Would like to also try holding omeprazole to see if she needs it  Discussed imp of self care Wonder if PT may help in future when feeling better  F/u in 1-2 mo       Neuropathic pain of hand, left    Still very bothersome She does not tolerate gabapentin

## 2021-10-21 NOTE — Assessment & Plan Note (Signed)
Wt is up to 135 lb today- very reassuring

## 2021-10-21 NOTE — Assessment & Plan Note (Signed)
Pt c/o weak spells (one fall) mid day and was seen in ER with reassuring w/u , bp was high but there was a ? As to whether low at home or orthostatic  Possible she may have amlodpine side effect because skipping it for 2 days helped  BP: 136/80  Has not taken lasix today  Takes hydralazine 10 mg bid  Amlodipine is generally 10 mg in am   Adv to hold amlodpine again and report back next wk with bp readings (may consider 5 mg in pm if bp is up a lot) Reviewed correct way to check bp

## 2021-10-21 NOTE — Assessment & Plan Note (Signed)
Still very bothersome She does not tolerate gabapentin

## 2021-10-21 NOTE — Patient Instructions (Addendum)
Hold the amlodipine (put it aside) for now  Let me know how your blood pressure is next week-if it goes up too much we can try starting back a 1/2 dose in the evening   Check your blood pressure 1 to 2 times daily when relaxed  Move your sertraline to bed time   Hold the omeprazole (stomach pill) - if you have a lot of heartburn or stomach trouble off of it let me know   Eat regularly  Drink more water  Change position slowly   Try to get out when you can, visit sister and friends and family   Follow up with me in 1-2 months back at [facility]   If your weakness worsens or does not improve or any new symptoms let me know

## 2021-10-22 ENCOUNTER — Telehealth: Payer: Self-pay | Admitting: *Deleted

## 2021-10-22 ENCOUNTER — Ambulatory Visit (INDEPENDENT_AMBULATORY_CARE_PROVIDER_SITE_OTHER): Payer: Medicare HMO | Admitting: Family Medicine

## 2021-10-22 ENCOUNTER — Encounter: Payer: Self-pay | Admitting: Family Medicine

## 2021-10-22 VITALS — BP 130/70 | HR 88 | Temp 97.3°F | Ht 60.0 in | Wt 133.0 lb

## 2021-10-22 DIAGNOSIS — I10 Essential (primary) hypertension: Secondary | ICD-10-CM

## 2021-10-22 DIAGNOSIS — R69 Illness, unspecified: Secondary | ICD-10-CM | POA: Diagnosis not present

## 2021-10-22 DIAGNOSIS — F418 Other specified anxiety disorders: Secondary | ICD-10-CM | POA: Diagnosis not present

## 2021-10-22 NOTE — Assessment & Plan Note (Signed)
More anxious lately in setting of elevated BP Checking bp makes her more anxious which elevates bp more (I suspect) Will hold off on home monitoring for now and schedule f/u in a week  Taking sertraline 100 mg at night  Encouraged more socialization and activity/she is trying to  Will disc further at f/u

## 2021-10-22 NOTE — Telephone Encounter (Signed)
PLEASE NOTE: All timestamps contained within this report are represented as [place] Standard Time. CONFIDENTIALTY NOTICE: This fax transmission is intended only for the addressee. It contains information that is legally privileged, confidential or otherwise protected from use or disclosure. If you are not the intended recipient, you are strictly prohibited from reviewing, disclosing, copying using or disseminating any of this information or taking any action in reliance on or regarding this information. If you have received this fax in error, please notify us immediately by telephone so that we can arrange for its return to [place]. Phone: [phone], Toll-Free: [phone], Fax: [phone] Page: 1 of 2 Call Id: [id number] [place] RECORD AccessNurse Patient Name: MORGIN [facility] Gender: Female DOB: 1929-02-09 Age: 88 Y 23 M 3 D Return Phone Number: [phone] (Primary) Address: [place]  [address] Client [facility] Night - Client Client Site [employer] Provider [employer], Roque Lias - MD Contact Type Call Who Is Calling Patient / Member / Family / Caregiver Call Type Triage / Clinical Relationship To Patient Self Return Phone Number [phone] (Primary) Chief Complaint BLOOD PRESSURE HIGH - Systolic (top number) [phone] or greater Reason for Call Symptomatic / Request for [place] states she was in the office for her elevated blood pressure and her blood pressure is 210/97. Caller states she wanted to know if she needs to take another blood pressure medication since she was told not to one of her medication. Translation No Nurse Assessment Nurse: Volanda Napoleon, RN, Wells Guiles Date/Time Eilene Ghazi Time): 10/22/2021 12:07:00 AM Confirm and document reason for call. If symptomatic, describe symptoms. ---Caller states that she was seen  in ED for high blood pressure and dr. took patient off her amlodipine. Caller states that her BP is 210/97, No symptoms associated. Does the patient have any new or worsening symptoms? ---Yes Will a triage be completed? ---Yes Related visit to physician within the last 2 weeks? ---Yes Does the PT have any chronic conditions? (i.e. diabetes, asthma, this includes High risk factors for pregnancy, etc.) ---Yes List chronic conditions. ---HTN, GERD, Is this a behavioral health or substance abuse call? ---No Guidelines Guideline Title Affirmed Question Affirmed Notes Nurse Date/Time (Eastern Time) Blood Pressure - High [7] Systolic BP >= 353 OR Diastolic >= 299 AND [2] having NO cardiac or neurologic symptoms Jori Moll 10/22/2021 12:09:39 AM PLEASE NOTE: All timestamps contained within this report are represented as [place] Standard Time. CONFIDENTIALTY NOTICE: This fax transmission is intended only for the addressee. It contains information that is legally privileged, confidential or otherwise protected from use or disclosure. If you are not the intended recipient, you are strictly prohibited from reviewing, disclosing, copying using or disseminating any of this information or taking any action in reliance on or regarding this information. If you have received this fax in error, please notify us immediately by telephone so that we can arrange for its return to [place]. Phone: [phone], Toll-Free: [phone], Fax: [phone] Page: 2 of 2 Call Id: [id number] [place]. Time Eilene Ghazi Time) Disposition Final User 10/22/2021 12:05:47 AM Send to Urgent Queue Josephine Cables 10/22/2021 12:13:17 AM Paged On Call back to [facility], RN, Wells Guiles 10/22/2021 12:36:25 AM Paged On Call back to [facility], [place], Wells Guiles 10/22/2021 12:53:57 AM Paged On Call back to [facility], [place], Wells Guiles 10/22/2021 12:11:38 AM See HCP within 4 Hours (or PCP triage) Yes Volanda Napoleon, RN,  Romualdo Bolk Disagree/Comply Comply Caller Understands Yes PreDisposition Call Doctor Care Advice Given Per Guideline SEE HCP (OR PCP TRIAGE) WITHIN 4 HOURS: CALL BACK IF: * Weakness or numbness of the face, arm or leg on one side of the body occurs * Difficulty walking, difficulty talking, or severe headache occurs * Chest pain or difficulty breathing occurs * You become worse Comments User: Viviann Spare, RN Date/Time (Eastern Time): 10/22/2021 12:15:40 AM On call MD has mailbox thats full, unable to leave message with call back # Referrals REFERRED TO PCP OFFICE Paging DoctorName Phone DateTime Result/ Outcome Message Type Notes Thersa Salt- MD [phone] 10/22/2021 12:13:17 AM Called on Call provider - No message left Doctor Paged Thersa Salt- MD [phone] 10/22/2021 12:36:25 AM Called on Call provider - No message left Doctor Paged Thersa Salt- MD [phone] 10/22/2021 12:53:57 AM Called on Call provider - No message left Doctor Paged Thersa Salt- MD 10/22/2021 1:31:36 AM Unable to Reach on call - Max Attempts Message Result

## 2021-10-22 NOTE — Telephone Encounter (Signed)
Pt was seen in office as planned

## 2021-10-22 NOTE — Assessment & Plan Note (Signed)
BP went up to 210/97 last night  Pt notes she was anxious before and then worse after checking it and wonders if this added to it  Was going to previously hold amlodipine due to feeling weak after taking it  She did take last night and now bp is ok and she feels fine BP: 130/70  Pulse is nl at 88 and nl exam  Will plan to continue taking amlodipine 10 mg at night (instead of during the day) Also inst to stop checking bp at home since the idea is making her more anxious and readings may be higher Continue  Lasix 20 mg daily  Hydralazine 10 mg bid  F/u 1 wk   Of note in past cannot take ace, arb, hctz , metoprolol and carvedilol unfortunately  Has seen cardiology in past for HTN Difficult for her to tolerate some medications Goal is to prevent weak spells

## 2021-10-22 NOTE — Telephone Encounter (Signed)
Patient scheduled today 10/22/21 with Dr. Glori Bickers at 10:30 am.

## 2021-10-22 NOTE — Progress Notes (Signed)
Subjective:    Patient ID: Linda Robinson, female    DOB: [birth year], 88 y.o.   MRN: [id number]  This visit occurred during the SARS-CoV-2 public health emergency.  Safety protocols were in place, including screening questions prior to the visit, additional usage of staff PPE, and extensive cleaning of exam room while observing appropriate contact time as indicated for disinfecting solutions.   HPI Pt presents for f/u of elevated bp   Wt Readings from Last 3 Encounters:  10/22/21 133 lb (60.3 kg)  10/21/21 135 lb (61.2 kg)  10/17/21 129 lb (58.5 kg)   25.97 kg/m   Seen yesterday with c/o weakness mid day after taking am medicines   This improved prior on days she held her amlodipine  (10 mg daily and prev taking in am)  We choose to hold it for a week to see how she does  Bp went up sharply and she called today to be seen   Last night up to 210/97 Then she went ahead and took an amlodipine    This am she broke down and took amlodipine  BP Readings from Last 3 Encounters:  10/22/21 130/70  10/21/21 136/80  10/17/21 (!) 187/93   Pulse Readings from Last 3 Encounters:  10/22/21 88  10/21/21 77  10/17/21 68    Hydralazine 10 mg bid  Lasix 20 mg daily   In past did not tolerate clonidine patch  In past ace caused inc K and Cr Losartan also caused inc K Hctz made her feel bad Metoprolol caused bradycardia Carvedilol was not tolerated/felt bad   She has seen cardiology (Dr Fletcher Anon) in 2018 for difficult to control bp in setting of medication intolerances  Also for cp which resolved Nl w/u  Lab Results  Component Value Date   CREATININE 0.96 10/17/2021   BUN 25 (H) 10/17/2021   NA 142 10/17/2021   K 3.5 10/17/2021   CL 102 10/17/2021   CO2 29 10/17/2021   Patient Active Problem List   Diagnosis Date Noted   Lower abdominal pain 07/23/2021   Frequency of urination 07/23/2021   Diverticulosis 07/23/2021   Cervical radicular pain (left) 09/12/2020   Cervical  disc disorder with radiculopathy of cervical region 09/12/2020   Cervical spondylosis 09/12/2020   Cervical fusion syndrome (C4-T1 ACDF) 09/12/2020   Neuropathic pain of hand, left 09/12/2020   Chronic pain syndrome 09/12/2020   Failed neck syndrome 07/30/2020   Left hand pain 07/30/2020   Generalized weakness 05/01/2020   Grief reaction 11/17/2019   Cervical stenosis of spinal canal 11/17/2019   Hearing loss 11/17/2019   Poor balance 06/12/2019   Screening mammogram, encounter for 05/23/2018   Substernal chest pain 08/16/2017   Atherosclerosis of aorta ([place]) 03/09/2017   Weight loss 02/23/2017   Routine general medical examination at a health care facility 07/05/2015   Estrogen deficiency 07/05/2015   Prediabetes 01/01/2015   Lumbar spinal stenosis 11/13/2014   Vertigo 10/05/2014   Risk for falls 06/27/2014   Low back pain on right side with sciatica 07/24/2013   Edema 04/26/2013   Encounter for Medicare annual wellness exam 04/11/2013   GERD (gastroesophageal reflux disease)    Osteopenia 01/30/2009   CONSTIPATION 11/01/2007   Depression with anxiety 02/14/2007   Hyperlipidemia LDL goal <130 02/04/2007   Gout 02/04/2007   Essential hypertension 02/04/2007   DIVERTICULOSIS, COLON 02/04/2007   Renal insufficiency 02/04/2007   OSTEOARTHRITIS 02/04/2007   DEGENERATIVE [place] DISEASE 02/04/2007   Past Medical History:  Diagnosis Date   Ankylosis of lumbar spine    Basal cell carcinoma    removed   Cervical spine pain    Chest pain    a. 02/2017 MV: EF 54%, small, distal anterior wall/apical infacrt (likely misregistration), no ischemia-->Low risk; b. 02/2017 Echo: EF 55-60%, mild MR, PASP 71mmHg.   Chronic back pain    stenosis   Constipation    takes Colace daily   Depression    takes Zoloft daily   Diverticulosis of colon    GERD (gastroesophageal reflux disease)    atypical chest pain   Gout    hx of;was on Allopurinol but taken off 6 wks ago by medical MD    History of diverticulitis of colon    HLD (hyperlipidemia)    takes Pravastatin daily   HTN (hypertension)    takes Amlodipine and carvedilol   Lumbar spondylosis with myelopathy    Numbness    OA (osteoarthritis)    right knee   Osteopenia    takes Calcium and Vit D daily   Scoliosis    Vertigo    takes Antivert daily as needed   Past Surgical History:  Procedure Laterality Date   APPENDECTOMY  at age 75   Deuel Right 04/2006   CHOLECYSTECTOMY  1970   COLONOSCOPY     KNEE SURGERY Right    Medial meniscus tear   LUMBAR LAMINECTOMY/DECOMPRESSION MICRODISCECTOMY Bilateral 11/13/2014   Procedure: Lumbar one-two Bilateral laminectomy and foraminotomy, Left Lumbar one-two microdiscectomy ;  Surgeon: Faythe Ghee, MD;  Location: [facility];  Service: Neurosurgery;  Laterality: Bilateral;   NECK SURGERY  [phone]   x 3; fusions   PARTIAL COLECTOMY  09/1999   d/t diverticulitis   TUBAL LIGATION     Social History   Tobacco Use   Smoking status: Never   Smokeless tobacco: Never  Vaping Use   Vaping Use: Never used  Substance Use Topics   Alcohol use: Never    Alcohol/week: 0.0 standard drinks   Drug use: Never   Family History  Problem Relation Age of Onset   Cancer Father        throat and bladder   Diabetes Father    Hypertension Father    Heart attack Father    Stroke Mother    Alzheimer's disease Mother    Hypertension Mother    Allergies  Allergen Reactions   Ace Inhibitors     REACTION: increased K+, increased creat.   Bactrim [Sulfamethoxazole-Trimethoprim]     rash   Cephalosporins     REACTION: reaction not known   Colesevelam     REACTION: constipation   Hctz [Hydrochlorothiazide]     Makes her feel "bad"   Losartan     Increased K   Lovastatin     REACTION: doesn't work   Lyrica [Pregabalin]     Felt bad    Raloxifene     REACTION: cramps   Rosuvastatin     REACTION: myalgia   Simvastatin     REACTION:  myalgia   Current Outpatient Medications on File Prior to Visit  Medication Sig Dispense Refill   amLODipine (NORVASC) 10 MG tablet Take 10 mg by mouth at bedtime.     aspirin EC 81 MG tablet Take 1 tablet (81 mg total) by mouth daily. 90 tablet 3   Calcium Carbonate-Vitamin D 500-125 MG-UNIT TABS Take by mouth.     Cranberry 250 MG  CAPS Take 1 capsule by mouth daily.     docusate sodium (COLACE) 100 MG capsule Take 1-2 capsules (100-200 mg total) by mouth 2 (two) times daily. 100 mg in the morning and 200 mg at night 180 capsule 1   famotidine (PEPCID) 20 MG tablet TAKE 1 TABLET TWICE A DAY 180 tablet 3   fish oil-omega-3 fatty acids 1000 MG capsule Take 1 g by mouth at bedtime.     furosemide (LASIX) 20 MG tablet TAKE 1 TABLET DAILY AS NEEDED FOR SWELLING 90 tablet 1   hydrALAZINE (APRESOLINE) 10 MG tablet Take 1 tablet (10 mg total) by mouth in the morning and at bedtime. 180 tablet 2   meclizine (ANTIVERT) 25 MG tablet Take 25 mg by mouth 4 (four) times daily as needed for dizziness.     pravastatin (PRAVACHOL) 20 MG tablet TAKE 1 TABLET DAILY 90 tablet 3   sertraline (ZOLOFT) 100 MG tablet TAKE 1 TABLET DAILY (Patient taking differently: at bedtime.) 90 tablet 3   Tetrahydrozoline HCl (EYE DROPS OP) Apply 1-2 drops to eye as needed (for dry eye).     No current facility-administered medications on file prior to visit.       Review of Systems  Constitutional:  Positive for fatigue. Negative for activity change, appetite change, fever and unexpected weight change.  HENT:  Negative for congestion, ear pain, rhinorrhea, sinus pressure and sore throat.   Eyes:  Negative for pain, redness and visual disturbance.  Respiratory:  Negative for cough, shortness of breath and wheezing.   Cardiovascular:  Negative for chest pain and palpitations.  Gastrointestinal:  Negative for abdominal pain, blood in stool, constipation and diarrhea.  Endocrine: Negative for polydipsia and polyuria.   Genitourinary:  Negative for dysuria, frequency and urgency.  Musculoskeletal:  Negative for arthralgias, back pain and myalgias.  Skin:  Negative for pallor and rash.  Allergic/Immunologic: Negative for environmental allergies.  Neurological:  Negative for dizziness, syncope and headaches.       Not weak right now  Had some generalized weak feeling earlier   Hematological:  Negative for adenopathy. Does not bruise/bleed easily.  Psychiatric/Behavioral:  Negative for decreased concentration and dysphoric mood. The patient is nervous/anxious.       Objective:   Physical Exam Constitutional:      General: She is not in acute distress.    Appearance: Normal appearance. She is normal weight. She is not ill-appearing or diaphoretic.  HENT:     Ears:     Comments: Hearing impaired Eyes:     General: No scleral icterus.    Conjunctiva/sclera: Conjunctivae normal.     Pupils: Pupils are equal, round, and reactive to light.  Neck:     Vascular: No carotid bruit.  Cardiovascular:     Rate and Rhythm: Normal rate and regular rhythm.     Heart sounds: Normal heart sounds. No murmur heard. Pulmonary:     Effort: Pulmonary effort is normal. No respiratory distress.     Breath sounds: Normal breath sounds. No stridor. No wheezing, rhonchi or rales.  Abdominal:     General: Abdomen is flat.  Musculoskeletal:     Cervical back: Normal range of motion and neck supple.     Right lower leg: No edema.     Left lower leg: No edema.  Lymphadenopathy:     Cervical: No cervical adenopathy.  Skin:    Coloration: Skin is not pale.     Findings: No erythema.  Neurological:  Mental Status: She is alert.     Cranial Nerves: No cranial nerve deficit.  Psychiatric:        Mood and Affect: Mood is anxious.        Cognition and Memory: Cognition and memory normal.     Comments: Anxious but relieved to have bp down  Good insight  Talks candidly about stressors and symptoms  Mentally sharp              Assessment & Plan:   Problem List Items Addressed This Visit       Cardiovascular and Mediastinum   Essential hypertension - Primary (Chronic)    BP went up to 210/97 last night  Pt notes she was anxious before and then worse after checking it and wonders if this added to it  Was going to previously hold amlodipine due to feeling weak after taking it  She did take last night and now bp is ok and she feels fine BP: 130/70  Pulse is nl at 88 and nl exam  Will plan to continue taking amlodipine 10 mg at night (instead of during the day) Also inst to stop checking bp at home since the idea is making her more anxious and readings may be higher Continue  Lasix 20 mg daily  Hydralazine 10 mg bid  F/u 1 wk   Of note in past cannot take ace, arb, hctz , metoprolol and carvedilol unfortunately  Has seen cardiology in past for HTN Difficult for her to tolerate some medications Goal is to prevent weak spells      Relevant Medications   amLODipine (NORVASC) 10 MG tablet     Other   Depression with anxiety    More anxious lately in setting of elevated BP Checking bp makes her more anxious which elevates bp more (I suspect) Will hold off on home monitoring for now and schedule f/u in a week  Taking sertraline 100 mg at night  Encouraged more socialization and activity/she is trying to  Will disc further at f/u

## 2021-10-22 NOTE — Patient Instructions (Signed)
Add back amlodipine 10 mg but take it at bedtime (instead of during the day)  Keep [place] posted with how you feel   If you feel poorly or weak let us know   Don't check blood pressure at home any more for now-put the machine away  I think the anxiety of checking it makes it go up more   Follow up in a week

## 2021-10-22 NOTE — Telephone Encounter (Signed)
PLEASE NOTE: All timestamps contained within this report are represented as [place] Standard Time. CONFIDENTIALTY NOTICE: This fax transmission is intended only for the addressee. It contains information that is legally privileged, confidential or otherwise protected from use or disclosure. If you are not the intended recipient, you are strictly prohibited from reviewing, disclosing, copying using or disseminating any of this information or taking any action in reliance on or regarding this information. If you have received this fax in error, please notify us immediately by telephone so that we can arrange for its return to [place]. Phone: [phone], Toll-Free: [phone], Fax: [phone] Page: 1 of 2 Call Id: [id number] Martin Day - Client TELEPHONE ADVICE RECORD AccessNurse Patient Name: Linda Robinson [facility] Gender: Female DOB: 1929/09/20 Age: 88 Y 57 M 3 D Return Phone Number: [phone] (Primary), [phone] (Secondary) Address[place]  [address] Client Gueydan Day - Client Client Site Shafer Provider Glori Bickers, Roque Lias - MD Contact Type Call Who Is Calling Patient / Member / Family / Caregiver Call Type Triage / Clinical Caller Name Merit Gadsby Relationship To Patient Son Return Phone Number [phone] (Primary) Chief Complaint OVERDOSE took too much medication at once Reason for Call Symptomatic / Request for Health Information Initial Comment Caller transferred by the office. Caller states his mother's blood pressure is 210/105 but it has gone down. Additional Comment Her blood pressure is now 185/84. She took amlodipine at 2am and the other one at 715am. Translation No Nurse Assessment Nurse: Raenette Rover, RN, Zella Ball Date/Time (Eastern Time): 10/22/2021 8:20:18 AM Confirm and document reason for call. If symptomatic, describe symptoms. ---Her blood pressure is now  185/84. She took amlodipine at 2am and the other one at 715am. Dosage of amlodipine 10 mg and takes it once daily . Went to hospital last Friday.( Saw Dr Dorothea Ogle yesterday and told to hold Amlodipine.) also takes apresoline 10 mg bid, but she took it anyway due to htn, 179/82 20 min ago. Does the patient have any new or worsening symptoms? ---Yes Will a triage be completed? ---Yes Related visit to physician within the last 2 weeks? ---Yes Does the PT have any chronic conditions? (i.e. diabetes, asthma, this includes High risk factors for pregnancy, etc.) ---Yes List chronic conditions. ---htn, Is this a behavioral health or substance abuse call? ---No Guidelines Guideline Title Affirmed Question Affirmed Notes Nurse Date/Time (Eastern Time) Blood Pressure - High [1] Taking BP medications AND [2] feels is having side effects (e.g., Raenette Rover, RN, Zella Ball 10/22/2021 8:25:43 AM PLEASE NOTE: All timestamps contained within this report are represented as [place] Standard Time. CONFIDENTIALTY NOTICE: This fax transmission is intended only for the addressee. It contains information that is legally privileged, confidential or otherwise protected from use or disclosure. If you are not the intended recipient, you are strictly prohibited from reviewing, disclosing, copying using or disseminating any of this information or taking any action in reliance on or regarding this information. If you have received this fax in error, please notify us immediately by telephone so that we can arrange for its return to [place]. Phone: [phone], Toll-Free: [phone], Fax: [phone] Page: 2 of 2 Call Id: [id number] Guidelines Guideline Title Affirmed Question Affirmed Notes Nurse Date/Time Eilene Ghazi Time) impotence, cough, dizzy upon standing) Disp. Time Eilene Ghazi Time) Disposition Final User 10/22/2021 8:19:13 AM Send to Urgent Queue Achilles Dunk 10/22/2021 8:37:21 AM See HCP within 4 Hours (or PCP  triage) Yes Raenette Rover, RN, Zella Ball  Disposition Overriden: SEE PCP WITHIN 3 DAYS Override Reason: Patients symptoms need a higher level of care Caller Disagree/Comply Comply Caller Understands Yes PreDisposition InappropriateToAsk Care Advice Given Per Guideline SEE HCP (OR PCP TRIAGE) WITHIN 4 HOURS: * UCC: Some UCCs can manage patients who are stable and have less serious symptoms (e.g., minor illnesses and injuries). The triager must know the [facility] capabilities before sending a patient there. If unsure, call ahead. CARE ADVICE given per High Blood Pressure (Adult) guideline. * You become worse Comments User: Wilson Singer, RN Date/Time Eilene Ghazi Time): 10/22/2021 8:31:41 AM 153/75 at 7:30 am User: Wilson Singer, RN Date/Time Eilene Ghazi Time): 10/22/2021 8:41:03 AM Her son Richardson Landry had towal the phone next door to the patient nad bp down to 153/70's and symptoms are that it wore her out to go to the kitchen to eat a bowl of cereal. Caller states but im also 74 yesars old. Increaed to be seen withint 4 hours due to taking meds she has been taken off of but maily due to uncontrolled htn. Caller states she was only taking me off of it for a few days to see how I would do. Called back line and spoke with [place] and apt made for 10 am this morning within our recommended time frame. Caller refused ER suggestion earlier when I believed the bp was 184, After I informed to go to er they took it again and determined it had come down to the 150's over 70's so reevaluated symtoms and upgraded to be seen within the next 4 hours. Referrals Warm transfer to backline

## 2021-10-29 ENCOUNTER — Encounter: Payer: Self-pay | Admitting: Family Medicine

## 2021-10-29 ENCOUNTER — Other Ambulatory Visit: Payer: Self-pay

## 2021-10-29 ENCOUNTER — Ambulatory Visit (INDEPENDENT_AMBULATORY_CARE_PROVIDER_SITE_OTHER): Payer: Medicare HMO | Admitting: Family Medicine

## 2021-10-29 VITALS — BP 131/60 | HR 79 | Temp 96.7°F | Ht 60.0 in | Wt 135.4 lb

## 2021-10-29 DIAGNOSIS — I10 Essential (primary) hypertension: Secondary | ICD-10-CM

## 2021-10-29 DIAGNOSIS — M170 Bilateral primary osteoarthritis of knee: Secondary | ICD-10-CM

## 2021-10-29 NOTE — Patient Instructions (Addendum)
Blood pressure is great   Continue current medicines  Glad you are doing better   Don't check your blood pressure, you do not need to   Take care of yourself   You do not have to follow up in march if you don't need to  Otherwise we could schedule an annual visit in the summer   Voltaren (diclofenac) gel may be helpful with arthritis and safer than ibuprofen (1% is over the counter) Tylenol is ok as directed also

## 2021-10-29 NOTE — Assessment & Plan Note (Signed)
bp in fair control at this time  BP Readings from Last 1 Encounters:  10/29/21 131/60   No changes needed Most recent labs reviewed  Disc lifstyle change with low sodium diet and exercise  Doing better dosing the amlodipine 10 mg at night -no more weak spells  Plan to continue lasix 20 mg daily and hydralazine 10 mg bid  inst her to not check bp at home as it was making her anxious

## 2021-10-29 NOTE — Assessment & Plan Note (Signed)
Pt is looking for suggestions  Uses tylenol   I think voltaren gel topically would be safer than otc ibuprofen in light of age and HTN   Recommend 1% otc to use prn

## 2021-10-29 NOTE — Progress Notes (Signed)
Subjective:    Patient ID: Linda Robinson, female    DOB: September 09, 1929, 88 y.o.   MRN: [id number]  This visit occurred during the SARS-CoV-2 public health emergency.  Safety protocols were in place, including screening questions prior to the visit, additional usage of staff PPE, and extensive cleaning of exam room while observing appropriate contact time as indicated for disinfecting solutions.   HPI Pt presents for f/u of HTN   Wt Readings from Last 3 Encounters:  10/29/21 135 lb 6 oz (61.4 kg)  10/22/21 133 lb (60.3 kg)  10/21/21 135 lb (61.2 kg)   26.44 kg/m   HTN  Last visit we tried to manage her amlodipine which at 10 mg may have caused weakness/fatigue   Holding it caused greatly inc bp So we decided to keep it and dose at night   bp is stable today  No cp or palpitations or headaches or edema  No side effects to medicines  BP Readings from Last 3 Encounters:  10/29/21 131/60  10/22/21 130/70  10/21/21 136/80    Also takes  Lasix 20 mg daily  Hydralazine 10 mg bid   Inst her to stop checking bp since that was making her anxious   Pulse Readings from Last 3 Encounters:  10/29/21 79  10/22/21 88  10/21/21 77   Feels better  No more weak spells   No longer anxious about blood pressure    She has bee intol to ace, arb, hctz, metoprolol and carvedilol in the paste  Has also seen cardiology    Lab Results  Component Value Date   CREATININE 0.96 10/17/2021   BUN 25 (H) 10/17/2021   NA 142 10/17/2021   K 3.5 10/17/2021   CL 102 10/17/2021   CO2 29 10/17/2021   GFR 33.4 Lab Results  Component Value Date   CHOL 224 (H) 06/04/2021   HDL 66.80 06/04/2021   LDLCALC 130 (H) 06/04/2021   LDLDIRECT 135.4 04/11/2013   TRIG 139.0 06/04/2021   CHOLHDL 3 06/04/2021  Pravastatin 20 mg daily  Fair diet   Lab Results  Component Value Date   HGBA1C 5.8 06/04/2021   Patient Active Problem List   Diagnosis Date Noted   Lower abdominal pain 07/23/2021    Frequency of urination 07/23/2021   Diverticulosis 07/23/2021   Cervical radicular pain (left) 09/12/2020   Cervical disc disorder with radiculopathy of cervical region 09/12/2020   Cervical spondylosis 09/12/2020   Cervical fusion syndrome (C4-T1 ACDF) 09/12/2020   Neuropathic pain of hand, left 09/12/2020   Chronic pain syndrome 09/12/2020   Failed neck syndrome 07/30/2020   Left hand pain 07/30/2020   Generalized weakness 05/01/2020   Grief reaction 11/17/2019   Cervical stenosis of spinal canal 11/17/2019   Hearing loss 11/17/2019   Poor balance 06/12/2019   Screening mammogram, encounter for 05/23/2018   Substernal chest pain 08/16/2017   Atherosclerosis of aorta ([place]) 03/09/2017   Weight loss 02/23/2017   Routine general medical examination at a health care facility 07/05/2015   Estrogen deficiency 07/05/2015   Prediabetes 01/01/2015   Lumbar spinal stenosis 11/13/2014   Vertigo 10/05/2014   Risk for falls 06/27/2014   Low back pain on right side with sciatica 07/24/2013   Edema 04/26/2013   Encounter for Medicare annual wellness exam 04/11/2013   GERD (gastroesophageal reflux disease)    Osteopenia 01/30/2009   CONSTIPATION 11/01/2007   Depression with anxiety 02/14/2007   Hyperlipidemia LDL goal <130 02/04/2007   Gout  02/04/2007   Essential hypertension 02/04/2007   DIVERTICULOSIS, COLON 02/04/2007   Renal insufficiency 02/04/2007   OSTEOARTHRITIS 02/04/2007   DEGENERATIVE [place] DISEASE 02/04/2007   Past Medical History:  Diagnosis Date   Ankylosis of lumbar spine    Basal cell carcinoma    removed   Cervical spine pain    Chest pain    a. 02/2017 MV: EF 54%, small, distal anterior wall/apical infacrt (likely misregistration), no ischemia-->Low risk; b. 02/2017 Echo: EF 55-60%, mild MR, PASP 12mmHg.   Chronic back pain    stenosis   Constipation    takes Colace daily   Depression    takes Zoloft daily   Diverticulosis of colon    GERD (gastroesophageal  reflux disease)    atypical chest pain   Gout    hx of;was on Allopurinol but taken off 6 wks ago by medical MD   History of diverticulitis of colon    HLD (hyperlipidemia)    takes Pravastatin daily   HTN (hypertension)    takes Amlodipine and carvedilol   Lumbar spondylosis with myelopathy    Numbness    OA (osteoarthritis)    right knee   Osteopenia    takes Calcium and Vit D daily   Scoliosis    Vertigo    takes Antivert daily as needed   Past Surgical History:  Procedure Laterality Date   APPENDECTOMY  at age 38   [place] Right 04/2006   CHOLECYSTECTOMY  1970   COLONOSCOPY     KNEE SURGERY Right    Medial meniscus tear   LUMBAR LAMINECTOMY/DECOMPRESSION MICRODISCECTOMY Bilateral 11/13/2014   Procedure: Lumbar one-two Bilateral laminectomy and foraminotomy, Left Lumbar one-two microdiscectomy ;  Surgeon: Faythe Ghee, MD;  Location: [facility];  Service: Neurosurgery;  Laterality: Bilateral;   NECK SURGERY  [phone]   x 3; fusions   PARTIAL COLECTOMY  09/1999   d/t diverticulitis   TUBAL LIGATION     Social History   Tobacco Use   Smoking status: Never   Smokeless tobacco: Never  Vaping Use   Vaping Use: Never used  Substance Use Topics   Alcohol use: Never    Alcohol/week: 0.0 standard drinks   Drug use: Never   Family History  Problem Relation Age of Onset   Cancer Father        throat and bladder   Diabetes Father    Hypertension Father    Heart attack Father    Stroke Mother    Alzheimer's disease Mother    Hypertension Mother    Allergies  Allergen Reactions   Ace Inhibitors     REACTION: increased K+, increased creat.   Bactrim [Sulfamethoxazole-Trimethoprim]     rash   Cephalosporins     REACTION: reaction not known   Colesevelam     REACTION: constipation   Hctz [Hydrochlorothiazide]     Makes her feel "bad"   Losartan     Increased K   Lovastatin     REACTION: doesn't work   Lyrica [Pregabalin]      Felt bad    Raloxifene     REACTION: cramps   Rosuvastatin     REACTION: myalgia   Simvastatin     REACTION: myalgia   Current Outpatient Medications on File Prior to Visit  Medication Sig Dispense Refill   amLODipine (NORVASC) 10 MG tablet Take 10 mg by mouth at bedtime.     aspirin EC 81  MG tablet Take 1 tablet (81 mg total) by mouth daily. 90 tablet 3   Calcium Carbonate-Vitamin D 500-125 MG-UNIT TABS Take by mouth.     Cranberry 250 MG CAPS Take 1 capsule by mouth daily.     docusate sodium (COLACE) 100 MG capsule Take 1-2 capsules (100-200 mg total) by mouth 2 (two) times daily. 100 mg in the morning and 200 mg at night 180 capsule 1   famotidine (PEPCID) 20 MG tablet TAKE 1 TABLET TWICE A DAY 180 tablet 3   fish oil-omega-3 fatty acids 1000 MG capsule Take 1 g by mouth at bedtime.     furosemide (LASIX) 20 MG tablet TAKE 1 TABLET DAILY AS NEEDED FOR SWELLING 90 tablet 1   hydrALAZINE (APRESOLINE) 10 MG tablet Take 1 tablet (10 mg total) by mouth in the morning and at bedtime. 180 tablet 2   meclizine (ANTIVERT) 25 MG tablet Take 25 mg by mouth 4 (four) times daily as needed for dizziness.     pravastatin (PRAVACHOL) 20 MG tablet TAKE 1 TABLET DAILY 90 tablet 3   sertraline (ZOLOFT) 100 MG tablet TAKE 1 TABLET DAILY (Patient taking differently: at bedtime.) 90 tablet 3   Tetrahydrozoline HCl (EYE DROPS OP) Apply 1-2 drops to eye as needed (for dry eye).     No current facility-administered medications on file prior to visit.     Review of Systems  Constitutional:  Negative for activity change, appetite change, fatigue, fever and unexpected weight change.  HENT:  Negative for congestion, ear pain, rhinorrhea, sinus pressure and sore throat.   Eyes:  Negative for pain, redness and visual disturbance.  Respiratory:  Negative for cough, shortness of breath and wheezing.   Cardiovascular:  Negative for chest pain and palpitations.  Gastrointestinal:  Negative for abdominal pain,  blood in stool, constipation and diarrhea.  Endocrine: Negative for polydipsia and polyuria.  Genitourinary:  Negative for dysuria, frequency and urgency.  Musculoskeletal:  Positive for arthralgias. Negative for back pain and myalgias.  Skin:  Negative for pallor and rash.  Allergic/Immunologic: Negative for environmental allergies.  Neurological:  Negative for dizziness, syncope and headaches.  Hematological:  Negative for adenopathy. Does not bruise/bleed easily.  Psychiatric/Behavioral:  Negative for decreased concentration and dysphoric mood. The patient is nervous/anxious.       Objective:   Physical Exam Constitutional:      General: She is not in acute distress.    Appearance: Normal appearance. She is normal weight. She is not ill-appearing or diaphoretic.  HENT:     Head: Normocephalic and atraumatic.  Eyes:     General: No scleral icterus.    Conjunctiva/sclera: Conjunctivae normal.     Pupils: Pupils are equal, round, and reactive to light.  Neck:     Vascular: No carotid bruit.  Cardiovascular:     Rate and Rhythm: Normal rate and regular rhythm.     Heart sounds: Normal heart sounds.  Pulmonary:     Effort: Pulmonary effort is normal. No respiratory distress.     Breath sounds: Normal breath sounds. No wheezing.  Musculoskeletal:     Cervical back: Normal range of motion and neck supple.     Right lower leg: No edema.     Left lower leg: No edema.     Comments: OA joint changes in knees   Baseline atrophy in L hand  Lymphadenopathy:     Cervical: No cervical adenopathy.  Skin:    Coloration: Skin is not pale.  Findings: No erythema or rash.  Neurological:     Mental Status: She is alert.     Deep Tendon Reflexes: Reflexes normal.     Comments: Mild hand tremor  Psychiatric:        Mood and Affect: Mood normal.          Assessment & Plan:   Problem List Items Addressed This Visit       Cardiovascular and Mediastinum   Essential hypertension  - Primary (Chronic)    bp in fair control at this time  BP Readings from Last 1 Encounters:  10/29/21 131/60  No changes needed Most recent labs reviewed  Disc lifstyle change with low sodium diet and exercise  Doing better dosing the amlodipine 10 mg at night -no more weak spells  Plan to continue lasix 20 mg daily and hydralazine 10 mg bid  inst her to not check bp at home as it was making her anxious         Musculoskeletal and Integument   Osteoarthritis of both knees    Pt is looking for suggestions  Uses tylenol   I think voltaren gel topically would be safer than otc ibuprofen in light of age and HTN   Recommend 1% otc to use prn

## 2021-11-04 DIAGNOSIS — H5213 Myopia, bilateral: Secondary | ICD-10-CM | POA: Diagnosis not present

## 2021-11-04 DIAGNOSIS — Z0100 Encounter for examination of eyes and vision without abnormal findings: Secondary | ICD-10-CM | POA: Diagnosis not present

## 2021-11-24 ENCOUNTER — Ambulatory Visit: Payer: Medicare HMO | Admitting: Family Medicine

## 2021-12-18 ENCOUNTER — Ambulatory Visit (INDEPENDENT_AMBULATORY_CARE_PROVIDER_SITE_OTHER): Payer: Medicare HMO | Admitting: Family Medicine

## 2021-12-18 ENCOUNTER — Other Ambulatory Visit: Payer: Self-pay

## 2021-12-18 ENCOUNTER — Encounter: Payer: Self-pay | Admitting: Family Medicine

## 2021-12-18 VITALS — BP 136/68 | HR 79 | Temp 97.4°F | Ht 60.0 in | Wt 134.2 lb

## 2021-12-18 DIAGNOSIS — I700 Atherosclerosis of aorta: Secondary | ICD-10-CM | POA: Diagnosis not present

## 2021-12-18 DIAGNOSIS — R7303 Prediabetes: Secondary | ICD-10-CM

## 2021-12-18 DIAGNOSIS — I10 Essential (primary) hypertension: Secondary | ICD-10-CM | POA: Diagnosis not present

## 2021-12-18 DIAGNOSIS — E785 Hyperlipidemia, unspecified: Secondary | ICD-10-CM | POA: Diagnosis not present

## 2021-12-18 DIAGNOSIS — F418 Other specified anxiety disorders: Secondary | ICD-10-CM

## 2021-12-18 DIAGNOSIS — R69 Illness, unspecified: Secondary | ICD-10-CM | POA: Diagnosis not present

## 2021-12-18 NOTE — Patient Instructions (Addendum)
Take care of yourself  ? ?Blood pressure is great  ?No change in medicines  ?Try to eat regular meals  ? ?Try over the counter magnesium 250 to 500 mg in the evening  ?It helps relax and may help pain  ?It can cause diarrhea so be careful with it  ? ?Also melatonin (over the counter as directed)  ?It may help sleep also  ? ?Please talk to your friends and family about your stress and how you feel  ?It helps  ?

## 2021-12-18 NOTE — Assessment & Plan Note (Signed)
Stable at last check ?Lab Results  ?Component Value Date  ? HGBA1C 5.8 06/04/2021  ? ? ?

## 2021-12-18 NOTE — Assessment & Plan Note (Signed)
Struggling with more anxiety in the setting of increased family stressors and chronic pain ?Having significant trouble sleeping ?Discussed trial of magnesium over-the-counter 250 to 500 mg at bedtime which may help relax her and also help with constipation ?Also discussed over-the-counter melatonin to help with sleep ?Would avoid more sedating things given age ?Encouraged patient to continue Zoloft 100 mg at bedtime ?

## 2021-12-18 NOTE — Assessment & Plan Note (Signed)
Blood pressure is well controlled, and cholesterol is controlled with pravastatin 20 mg daily ?We will continue to monitor ?

## 2021-12-18 NOTE — Assessment & Plan Note (Signed)
bp in fair control at this time  ?BP Readings from Last 1 Encounters:  ?12/18/21 136/68  ? ?No changes needed ?Most recent labs reviewed  ?Disc lifstyle change with low sodium diet and exercise  ?Plan to continue amlodipine 10 mg at bedtime ?Lasix 20 mg daily ?Hydralazine 10 mg twice daily ?Patient no longer has dizzy and weak spells, tolerates medicines well and no longer checks blood pressure at home ?

## 2021-12-18 NOTE — Progress Notes (Signed)
? ?Subjective:  ? ? Patient ID: Linda Robinson, female    DOB: 1928-10-21, 88 y.o.   MRN: [id number] ? ?This visit occurred during the SARS-CoV-2 public health emergency.  Safety protocols were in place, including screening questions prior to the visit, additional usage of staff PPE, and extensive cleaning of exam room while observing appropriate contact time as indicated for disinfecting solutions.  ? ?HPI ?Pt presents for f/u of HTN ? ?Wt Readings from Last 3 Encounters:  ?12/18/21 134 lb 4 oz (60.9 kg)  ?10/29/21 135 lb 6 oz (61.4 kg)  ?10/22/21 133 lb (60.3 kg)  ? ?26.22 kg/m? ?Feeling ok  ?Hard to get around  ? ?Knees and hand still bother her  ?Heat helps  ? ?HTN ?bp is stable today  ?No cp or palpitations or headaches or edema  ?No side effects to medicines  ?BP Readings from Last 3 Encounters:  ?12/18/21 136/68  ?10/29/21 131/60  ?10/22/21 130/70  ?   ?Amlodipine 10 mg at night now (day time gave her weak spells) ?Lasix 20 mg daily  ?Hydralazine 10 mg bid  ? ?Not taking bp at home  ? ? ?Lots of stress-esp after loosing husband  ?Family issues  ?Does try to talk to friends and family  ?Tries to crochet if hard on her hands  ? ? ? ?Lab Results  ?Component Value Date  ? CREATININE 0.96 10/17/2021  ? BUN 25 (H) 10/17/2021  ? NA 142 10/17/2021  ? K 3.5 10/17/2021  ? CL 102 10/17/2021  ? CO2 29 10/17/2021  ? ?Lab Results  ?Component Value Date  ? TSH 3.442 10/17/2021  ? ?Lab Results  ?Component Value Date  ? CHOL 224 (H) 06/04/2021  ? HDL 66.80 06/04/2021  ? LDLCALC 130 (H) 06/04/2021  ? LDLDIRECT 135.4 04/11/2013  ? TRIG 139.0 06/04/2021  ? CHOLHDL 3 06/04/2021  ? ?Pravastatin 20 mg daily  ? ?Prediabetes ?Lab Results  ?Component Value Date  ? HGBA1C 5.8 06/04/2021  ? ? ?Patient Active Problem List  ? Diagnosis Date Noted  ? Osteoarthritis of both knees 10/29/2021  ? Lower abdominal pain 07/23/2021  ? Frequency of urination 07/23/2021  ? Diverticulosis 07/23/2021  ? Cervical radicular pain (left) 09/12/2020  ?  Cervical disc disorder with radiculopathy of cervical region 09/12/2020  ? Cervical spondylosis 09/12/2020  ? Cervical fusion syndrome (C4-T1 ACDF) 09/12/2020  ? Neuropathic pain of hand, left 09/12/2020  ? Chronic pain syndrome 09/12/2020  ? Failed neck syndrome 07/30/2020  ? Left hand pain 07/30/2020  ? Generalized weakness 05/01/2020  ? Grief reaction 11/17/2019  ? Cervical stenosis of spinal canal 11/17/2019  ? Hearing loss 11/17/2019  ? Poor balance 06/12/2019  ? Screening mammogram, encounter for 05/23/2018  ? Substernal chest pain 08/16/2017  ? Atherosclerosis of aorta ([place]) 03/09/2017  ? Weight loss 02/23/2017  ? Routine general medical examination at a health care facility 07/05/2015  ? Estrogen deficiency 07/05/2015  ? Prediabetes 01/01/2015  ? Lumbar spinal stenosis 11/13/2014  ? Vertigo 10/05/2014  ? Risk for falls 06/27/2014  ? Low back pain on right side with sciatica 07/24/2013  ? Edema 04/26/2013  ? Encounter for Medicare annual wellness exam 04/11/2013  ? GERD (gastroesophageal reflux disease)   ? Osteopenia 01/30/2009  ? CONSTIPATION 11/01/2007  ? Depression with anxiety 02/14/2007  ? Hyperlipidemia LDL goal <130 02/04/2007  ? Gout 02/04/2007  ? Essential hypertension 02/04/2007  ? DIVERTICULOSIS, COLON 02/04/2007  ? Renal insufficiency 02/04/2007  ? OSTEOARTHRITIS 02/04/2007  ?  [place] DISEASE 02/04/2007  ? ?Past Medical History:  ?Diagnosis Date  ? Ankylosis of lumbar spine   ? Basal cell carcinoma   ? removed  ? Cervical spine pain   ? Chest pain   ? a. 02/2017 MV: EF 54%, small, distal anterior wall/apical infacrt (likely misregistration), no ischemia-->Low risk; b. 02/2017 Echo: EF 55-60%, mild MR, PASP 32mHg.  ? Chronic back pain   ? stenosis  ? Constipation   ? takes Colace daily  ? Depression   ? takes Zoloft daily  ? Diverticulosis of colon   ? GERD (gastroesophageal reflux disease)   ? atypical chest pain  ? Gout   ? hx of;was on Allopurinol but taken off 6 wks ago by medical MD   ? History of diverticulitis of colon   ? HLD (hyperlipidemia)   ? takes Pravastatin daily  ? HTN (hypertension)   ? takes Amlodipine and carvedilol  ? Lumbar spondylosis with myelopathy   ? Numbness   ? OA (osteoarthritis)   ? right knee  ? Osteopenia   ? takes Calcium and Vit D daily  ? Scoliosis   ? Vertigo   ? takes Antivert daily as needed  ? ?Past Surgical History:  ?Procedure Laterality Date  ? APPENDECTOMY  at age 88 ? BACK SURGERY    ? CATARACT EXTRACTION Right 04/2006  ? CHOLECYSTECTOMY  1970  ? COLONOSCOPY    ? KNEE SURGERY Right   ? Medial meniscus tear  ? LUMBAR LAMINECTOMY/DECOMPRESSION MICRODISCECTOMY Bilateral 11/13/2014  ? Procedure: Lumbar one-two Bilateral laminectomy and foraminotomy, Left Lumbar one-two microdiscectomy ;  Surgeon: RFaythe Ghee MD;  [facility];  Service: Neurosurgery;  Laterality: Bilateral;  ? NECK SURGERY  [phone] ? x 3; fusions  ? PARTIAL COLECTOMY  09/1999  ? d/t diverticulitis  ? TUBAL LIGATION    ? ?Social History  ? ?Tobacco Use  ? Smoking status: Never  ? Smokeless tobacco: Never  ?Vaping Use  ? Vaping Use: Never used  ?Substance Use Topics  ? Alcohol use: Never  ?  Alcohol/week: 0.0 standard drinks  ? Drug use: Never  ? ?Family History  ?Problem Relation Age of Onset  ? Cancer Father   ?     throat and bladder  ? Diabetes Father   ? Hypertension Father   ? Heart attack Father   ? Stroke Mother   ? Alzheimer's disease Mother   ? Hypertension Mother   ? ?Allergies  ?Allergen Reactions  ? Ace Inhibitors   ?  REACTION: increased K+, increased creat.  ? Bactrim [Sulfamethoxazole-Trimethoprim]   ?  rash  ? Cephalosporins   ?  REACTION: reaction not known  ? Colesevelam   ?  REACTION: constipation  ? Hctz [Hydrochlorothiazide]   ?  Makes her feel "bad"  ? Losartan   ?  Increased K  ? Lovastatin   ?  REACTION: doesn't work  ? Lyrica [Pregabalin]   ?  Felt bad   ? Raloxifene   ?  REACTION: cramps  ? Rosuvastatin   ?  REACTION: myalgia  ? Simvastatin   ?   REACTION: myalgia  ? ?Current Outpatient Medications on File Prior to Visit  ?Medication Sig Dispense Refill  ? amLODipine (NORVASC) 10 MG tablet Take 10 mg by mouth at bedtime.    ? aspirin EC 81 MG tablet Take 1 tablet (81 mg total) by mouth daily. 90 tablet 3  ? Calcium Carbonate-Vitamin D 500-125 MG-UNIT TABS  Take by mouth.    ? Cranberry 250 MG CAPS Take 1 capsule by mouth daily.    ? docusate sodium (COLACE) 100 MG capsule Take 1-2 capsules (100-200 mg total) by mouth 2 (two) times daily. 100 mg in the morning and 200 mg at night 180 capsule 1  ? famotidine (PEPCID) 20 MG tablet TAKE 1 TABLET TWICE A DAY 180 tablet 3  ? fish oil-omega-3 fatty acids 1000 MG capsule Take 1 g by mouth at bedtime.    ? furosemide (LASIX) 20 MG tablet TAKE 1 TABLET DAILY AS NEEDED FOR SWELLING 90 tablet 1  ? hydrALAZINE (APRESOLINE) 10 MG tablet Take 1 tablet (10 mg total) by mouth in the morning and at bedtime. 180 tablet 2  ? meclizine (ANTIVERT) 25 MG tablet Take 25 mg by mouth 4 (four) times daily as needed for dizziness.    ? pravastatin (PRAVACHOL) 20 MG tablet TAKE 1 TABLET DAILY 90 tablet 3  ? sertraline (ZOLOFT) 100 MG tablet TAKE 1 TABLET DAILY (Patient taking differently: at bedtime.) 90 tablet 3  ? Tetrahydrozoline HCl (EYE DROPS OP) Apply 1-2 drops to eye as needed (for dry eye).    ? ?No current facility-administered medications on file prior to visit.  ?  ?Review of Systems  ?Constitutional:  Positive for fatigue. Negative for activity change, appetite change, fever and unexpected weight change.  ?HENT:  Negative for congestion, ear pain, rhinorrhea, sinus pressure and sore throat.   ?Eyes:  Negative for pain, redness and visual disturbance.  ?Respiratory:  Negative for cough, shortness of breath and wheezing.   ?Cardiovascular:  Negative for chest pain and palpitations.  ?Gastrointestinal:  Negative for abdominal pain, blood in stool, constipation and diarrhea.  ?Endocrine: Negative for polydipsia and polyuria.   ?Genitourinary:  Negative for dysuria, frequency and urgency.  ?Musculoskeletal:  Positive for arthralgias, back pain and gait problem. Negative for myalgias.  ?Skin:  Negative for pallor and rash.  ?Allergic/Immun

## 2021-12-18 NOTE — Assessment & Plan Note (Signed)
Disc goals for lipids and reasons to control them ?Rev last labs with pt ?Rev low sat fat diet in detail ?Continues pravastatin 20 mg daily which she tolerates  ?

## 2022-01-21 ENCOUNTER — Other Ambulatory Visit: Payer: Self-pay | Admitting: Family Medicine

## 2022-01-22 ENCOUNTER — Telehealth: Payer: Self-pay

## 2022-01-22 NOTE — Telephone Encounter (Signed)
Linda Robinson was scheduled for an AWVS on 12/31/2021. When I tried to check the patient in, I was was unable to proceed because of a block on the account. I advised Amy and Mandy. They had to contact IT but IT was unable to correct it either. I did do the visit on paper but I have been unable to document it. I just wanted to make sure that it is documented that she did have the AWV. Thank you. ?

## 2022-01-22 NOTE — Telephone Encounter (Signed)
Thank you :)

## 2022-02-03 ENCOUNTER — Encounter: Payer: Self-pay | Admitting: Family Medicine

## 2022-02-03 ENCOUNTER — Ambulatory Visit (INDEPENDENT_AMBULATORY_CARE_PROVIDER_SITE_OTHER): Payer: Medicare HMO | Admitting: Family Medicine

## 2022-02-03 VITALS — BP 140/66 | HR 67 | Temp 97.6°F | Ht 60.0 in | Wt 135.0 lb

## 2022-02-03 DIAGNOSIS — R7303 Prediabetes: Secondary | ICD-10-CM

## 2022-02-03 DIAGNOSIS — R531 Weakness: Secondary | ICD-10-CM | POA: Diagnosis not present

## 2022-02-03 DIAGNOSIS — I10 Essential (primary) hypertension: Secondary | ICD-10-CM

## 2022-02-03 DIAGNOSIS — D72829 Elevated white blood cell count, unspecified: Secondary | ICD-10-CM | POA: Diagnosis not present

## 2022-02-03 DIAGNOSIS — F418 Other specified anxiety disorders: Secondary | ICD-10-CM

## 2022-02-03 DIAGNOSIS — R69 Illness, unspecified: Secondary | ICD-10-CM | POA: Diagnosis not present

## 2022-02-03 DIAGNOSIS — K5900 Constipation, unspecified: Secondary | ICD-10-CM

## 2022-02-03 LAB — COMPREHENSIVE METABOLIC PANEL
ALT: 10 U/L (ref 0–35)
AST: 17 U/L (ref 0–37)
Albumin: 4.5 g/dL (ref 3.5–5.2)
Alkaline Phosphatase: 62 U/L (ref 39–117)
BUN: 28 mg/dL — ABNORMAL HIGH (ref 6–23)
CO2: 33 mEq/L — ABNORMAL HIGH (ref 19–32)
Calcium: 10 mg/dL (ref 8.4–10.5)
Chloride: 100 mEq/L (ref 96–112)
Creatinine, Ser: 1.19 mg/dL (ref 0.40–1.20)
GFR: 40.13 mL/min — ABNORMAL LOW (ref >60.00)
Glucose, Bld: 88 mg/dL (ref 70–99)
Potassium: 5 mEq/L (ref 3.5–5.1)
Sodium: 141 mEq/L (ref 135–145)
Total Bilirubin: 0.5 mg/dL (ref 0.2–1.2)
Total Protein: 6.9 g/dL (ref 6.0–8.3)

## 2022-02-03 LAB — HEMOGLOBIN A1C: Hgb A1c MFr Bld: 5.5 % (ref 4.6–6.5)

## 2022-02-03 LAB — TSH: TSH: 2.91 u[IU]/mL (ref 0.35–5.50)

## 2022-02-03 LAB — IRON: Iron: 87 ug/dL (ref 42–145)

## 2022-02-03 LAB — FERRITIN: Ferritin: 33.2 ng/mL (ref 10.0–291.0)

## 2022-02-03 MED ORDER — SERTRALINE HCL 100 MG PO TABS: 150.0000 mg | ORAL_TABLET | Freq: Every day | ORAL | 3 refills | Status: DC

## 2022-02-03 NOTE — Assessment & Plan Note (Signed)
Worse with family stressors and chronic pain  ?Reviewed stressors/ coping techniques/symptoms/ support sources/ tx options and side effects in detail today  ? ?Will see if low dose mag helps anx  ?Inc sertraline to 150 mg daily watching closely for side effects  ?Discussed expectations of SSRI medication including time to effectiveness and mechanism of action, also poss of side effects (early and late)- including mental fuzziness, weight or appetite change, nausea and poss of worse dep or anxiety (even suicidal thoughts)  Pt voiced understanding and will stop med and update if this occurs   ?Declines counseling ref ?F/u 1 mo ?

## 2022-02-03 NOTE — Assessment & Plan Note (Signed)
a1c ordered  ?Eating more regularly may help her spells  ?Disc regular meals and snacks with protein  ?

## 2022-02-03 NOTE — Progress Notes (Signed)
? ?Subjective:  ? ? Patient ID: Linda Robinson, female    DOB: [birth year], 88 y.o.   MRN: [id number] ? ?HPI ?Pt presents for fatigue /weakness and depressed mood ? ?Wt Readings from Last 3 Encounters:  ?02/03/22 135 lb (61.2 kg)  ?12/18/21 134 lb 4 oz (60.9 kg)  ?10/29/21 135 lb 6 oz (61.4 kg)  ? ?26.37 kg/m? ? ?Has times of weakness -randomly  ?Not as bad as the past (it did go away for a while)  ?When she ate a cookie it got better last time  ? ?Lost balance once between beds but no injury  ?Has not fallen due to the weak spells  ?Has not hit her head ? ?Eats 3 meals per day  ? ?Am : cereal/ coffee  ? ?MWF-church brings meals / stuff to put in the oven  ?Home cooked meal  ? ?Eats meat with her meals  ?Some nuts and pb  ?Has dairy  ?Cheese  ?Some beans  ? ?H/o depression and anxiety  ?Last visit disc worse anxiety in setting of inc family stressors and chronic pain  ?Trouble sleeping ?Disc trial of magnesium for this and constipation (she takes one daily -unsure which dose)  ?Stools are more loose  ?Also disc melatonin  ? ?Still struggling  ? ?L hand hurts/frustrated  ?She has tried to crochet  ? ? ?Zoloft 100 mg at bedtime  ? ?Last labs ?Lab Results  ?Component Value Date  ? CREATININE 0.96 10/17/2021  ? BUN 25 (H) 10/17/2021  ? NA 142 10/17/2021  ? K 3.5 10/17/2021  ? CL 102 10/17/2021  ? CO2 29 10/17/2021  ?GFR 33.4 in the fall  ?Lab Results  ?Component Value Date  ? WBC 10.6 (H) 10/17/2021  ? HGB 14.7 10/17/2021  ? HCT 44.5 10/17/2021  ? MCV 87.6 10/17/2021  ? PLT 318 10/17/2021  ? ?Lab Results  ?Component Value Date  ? ALT 10 07/23/2021  ? AST 20 07/23/2021  ? ALKPHOS 67 07/23/2021  ? BILITOT 0.6 07/23/2021  ? ?Lab Results  ?Component Value Date  ? TSH 3.442 10/17/2021  ? ?Lab Results  ?Component Value Date  ? HGBA1C 5.8 06/04/2021  ? ?Urine cx neg in the fall  ? ?Lab Results  ?Component Value Date  ? [id number] 02/23/2017  ? ? ? ?Patient Active Problem List  ? Diagnosis Date Noted  ? Osteoarthritis of  both knees 10/29/2021  ? Lower abdominal pain 07/23/2021  ? Frequency of urination 07/23/2021  ? Diverticulosis 07/23/2021  ? Cervical radicular pain (left) 09/12/2020  ? Cervical disc disorder with radiculopathy of cervical region 09/12/2020  ? Cervical spondylosis 09/12/2020  ? Cervical fusion syndrome (C4-T1 ACDF) 09/12/2020  ? Neuropathic pain of hand, left 09/12/2020  ? Chronic pain syndrome 09/12/2020  ? Failed neck syndrome 07/30/2020  ? Left hand pain 07/30/2020  ? Weakness 05/01/2020  ? Grief reaction 11/17/2019  ? Cervical stenosis of spinal canal 11/17/2019  ? Hearing loss 11/17/2019  ? Poor balance 06/12/2019  ? Screening mammogram, encounter for 05/23/2018  ? Substernal chest pain 08/16/2017  ? Atherosclerosis of aorta ([place]) 03/09/2017  ? Weight loss 02/23/2017  ? Routine general medical examination at a health care facility 07/05/2015  ? Estrogen deficiency 07/05/2015  ? Prediabetes 01/01/2015  ? Lumbar spinal stenosis 11/13/2014  ? Vertigo 10/05/2014  ? Leukocytosis 07/16/2014  ? Risk for falls 06/27/2014  ? Low back pain on right side with sciatica 07/24/2013  ? Edema 04/26/2013  ? Encounter  for Medicare annual wellness exam 04/11/2013  ? GERD (gastroesophageal reflux disease)   ? Osteopenia 01/30/2009  ? Constipation 11/01/2007  ? Depression with anxiety 02/14/2007  ? Hyperlipidemia LDL goal <130 02/04/2007  ? Gout 02/04/2007  ? Essential hypertension 02/04/2007  ? DIVERTICULOSIS, COLON 02/04/2007  ? Renal insufficiency 02/04/2007  ? OSTEOARTHRITIS 02/04/2007  ? [place] DISEASE 02/04/2007  ? ?Past Medical History:  ?Diagnosis Date  ? Ankylosis of lumbar spine   ? Basal cell carcinoma   ? removed  ? Cervical spine pain   ? Chest pain   ? a. 02/2017 MV: EF 54%, small, distal anterior wall/apical infacrt (likely misregistration), no ischemia-->Low risk; b. 02/2017 Echo: EF 55-60%, mild MR, PASP 53mHg.  ? Chronic back pain   ? stenosis  ? Constipation   ? takes Colace daily  ? Depression   ?  takes Zoloft daily  ? Diverticulosis of colon   ? GERD (gastroesophageal reflux disease)   ? atypical chest pain  ? Gout   ? hx of;was on Allopurinol but taken off 6 wks ago by medical MD  ? History of diverticulitis of colon   ? HLD (hyperlipidemia)   ? takes Pravastatin daily  ? HTN (hypertension)   ? takes Amlodipine and carvedilol  ? Lumbar spondylosis with myelopathy   ? Numbness   ? OA (osteoarthritis)   ? right knee  ? Osteopenia   ? takes Calcium and Vit D daily  ? Scoliosis   ? Vertigo   ? takes Antivert daily as needed  ? ?Past Surgical History:  ?Procedure Laterality Date  ? APPENDECTOMY  at age 88 ? BACK SURGERY    ? CATARACT EXTRACTION Right 04/2006  ? CHOLECYSTECTOMY  1970  ? COLONOSCOPY    ? KNEE SURGERY Right   ? Medial meniscus tear  ? LUMBAR LAMINECTOMY/DECOMPRESSION MICRODISCECTOMY Bilateral 11/13/2014  ? Procedure: Lumbar one-two Bilateral laminectomy and foraminotomy, Left Lumbar one-two microdiscectomy ;  Surgeon: RFaythe Ghee MD;  [facility];  Service: Neurosurgery;  Laterality: Bilateral;  ? NECK SURGERY  [phone] ? x 3; fusions  ? PARTIAL COLECTOMY  09/1999  ? d/t diverticulitis  ? TUBAL LIGATION    ? ?Social History  ? ?Tobacco Use  ? Smoking status: Never  ? Smokeless tobacco: Never  ?Vaping Use  ? Vaping Use: Never used  ?Substance Use Topics  ? Alcohol use: Never  ?  Alcohol/week: 0.0 standard drinks  ? Drug use: Never  ? ?Family History  ?Problem Relation Age of Onset  ? Cancer Father   ?     throat and bladder  ? Diabetes Father   ? Hypertension Father   ? Heart attack Father   ? Stroke Mother   ? Alzheimer's disease Mother   ? Hypertension Mother   ? ?Allergies  ?Allergen Reactions  ? Ace Inhibitors   ?  REACTION: increased K+, increased creat.  ? Bactrim [Sulfamethoxazole-Trimethoprim]   ?  rash  ? Cephalosporins   ?  REACTION: reaction not known  ? Colesevelam   ?  REACTION: constipation  ? Hctz [Hydrochlorothiazide]   ?  Makes her feel "bad"  ? Losartan   ?   Increased K  ? Lovastatin   ?  REACTION: doesn't work  ? Lyrica [Pregabalin]   ?  Felt bad   ? Raloxifene   ?  REACTION: cramps  ? Rosuvastatin   ?  REACTION: myalgia  ? Simvastatin   ?  REACTION:  myalgia  ? ?Current Outpatient Medications on File Prior to Visit  ?Medication Sig Dispense Refill  ? amLODipine (NORVASC) 10 MG tablet Take 10 mg by mouth at bedtime.    ? aspirin EC 81 MG tablet Take 1 tablet (81 mg total) by mouth daily. 90 tablet 3  ? Calcium Carbonate-Vitamin D 500-125 MG-UNIT TABS Take by mouth.    ? Cranberry 250 MG CAPS Take 1 capsule by mouth daily.    ? docusate sodium (COLACE) 100 MG capsule Take 1-2 capsules (100-200 mg total) by mouth 2 (two) times daily. 100 mg in the morning and 200 mg at night 180 capsule 1  ? famotidine (PEPCID) 20 MG tablet TAKE 1 TABLET TWICE A DAY 180 tablet 3  ? fish oil-omega-3 fatty acids 1000 MG capsule Take 1 g by mouth at bedtime.    ? furosemide (LASIX) 20 MG tablet TAKE 1 TABLET DAILY AS     NEEDED FOR SWELLING 90 tablet 1  ? hydrALAZINE (APRESOLINE) 10 MG tablet Take 1 tablet (10 mg total) by mouth in the morning and at bedtime. 180 tablet 2  ? meclizine (ANTIVERT) 25 MG tablet Take 25 mg by mouth 4 (four) times daily as needed for dizziness.    ? pravastatin (PRAVACHOL) 20 MG tablet TAKE 1 TABLET DAILY 90 tablet 3  ? Tetrahydrozoline HCl (EYE DROPS OP) Apply 1-2 drops to eye as needed (for dry eye).    ? ?No current facility-administered medications on file prior to visit.  ?  ? ?Review of Systems  ?Constitutional:  Positive for fatigue. Negative for activity change, appetite change, fever and unexpected weight change.  ?HENT:  Negative for congestion, ear pain, rhinorrhea, sinus pressure and sore throat.   ?Eyes:  Negative for pain, redness and visual disturbance.  ?Respiratory:  Negative for cough, shortness of breath and wheezing.   ?Cardiovascular:  Negative for chest pain and palpitations.  ?Gastrointestinal:  Negative for abdominal pain, blood in stool,  constipation and diarrhea.  ?Endocrine: Negative for polydipsia and polyuria.  ?Genitourinary:  Negative for dysuria, frequency and urgency.  ?Musculoskeletal:  Negative for arthralgias, back pain and myalgias.

## 2022-02-03 NOTE — Assessment & Plan Note (Signed)
MOM is too strong  ?Good fluid intake ?Enc her to try magnesium 250 mg daily and see if this helps (also for anxiety)  ?Will let us know  ?If diarrhea will have to stop ?Encouraged use of miralax as well ?

## 2022-02-03 NOTE — Assessment & Plan Note (Signed)
bp in fair control at this time  ?BP Readings from Last 1 Encounters:  ?02/03/22 140/66  ? ?No changes needed ?Most recent labs reviewed  ?Disc lifstyle change with low sodium diet and exercise  ?Amlodipine 10 mg at bedtime ?Lasix 20 mg daily  ?hydralaine 10 mg gid ? ?Now having weak spells again, unsure if related  ? ?

## 2022-02-03 NOTE — Patient Instructions (Addendum)
Get 3 meals per day with protein  ?Also snacks with protein  ? ?You need some extra protein in am /breakfast  ? ? ?Nuts  ?Nut butters  ?Meat ?Fish ?Dairy including milk and cheese  ?Beans (dried beans)  ? ?Take frequent breaks  ?Don't stand for more than 10 minutes at a time  ? ? ? ?Magnesium and milk of magnesia can cause loose stool and also help with anxiety  ? ?Stop the milk of magnesia and just take magnesium 250 mg daily  ?Does not matter what time  ?If stool is too loose with magnesium 250 then stop it  ? ?For anxiety go up on the generic zoloft (sertraline) to 1 1/2 pills once daily in the evening  ?Follow up here in about 4 weeks  ?If you have side effects or if weak spells get worse then let us know earlier  ? ?Let's check some blood today to make sure the weak spells are coming from anything  ? ?

## 2022-02-03 NOTE — Assessment & Plan Note (Addendum)
Episodic spells of weak feeling (no falls) resolved with change in bp medicines, but have now returned ?Usually after standing 5-10 minutes  ?Some anxiety  ?One spell resolved when eating a cookie  ?Has had a work up in the past  ?Neg MRI brain (just atrophy) and CT since 2020  ? ?bp and exam-reassuring  ?Is anxious today but not severely  ?Lab ordered ?? Consider PT  ?Will inc sertraline carefully for anxiety  ?F/u 1 month  ?

## 2022-02-03 NOTE — Assessment & Plan Note (Addendum)
Re check today  ?Some fatigue  ?Path rev ordered  ?No signs of infection  ? ?Addendum: ?Path rev notes lymphocytosis, some atyp lymphs  ?Unsure if this could be linked to weak spells  ?Ref done to hematology ?

## 2022-02-04 LAB — CBC WITH DIFFERENTIAL/PLATELET
Absolute Monocytes: 792 cells/uL (ref 200–950)
Basophils Absolute: 44 cells/uL (ref 0–200)
Basophils Relative: 0.4 %
Eosinophils Absolute: 121 cells/uL (ref 15–500)
Eosinophils Relative: 1.1 %
HCT: 43.7 % (ref 35.0–45.0)
Hemoglobin: 14.6 g/dL (ref 11.7–15.5)
Lymphs Abs: 4752 cells/uL — ABNORMAL HIGH (ref 850–3900)
MCH: 30 pg (ref 27.0–33.0)
MCHC: 33.4 g/dL (ref 32.0–36.0)
MCV: 89.7 fL (ref 80.0–100.0)
MPV: 10.8 fL (ref 7.5–12.5)
Monocytes Relative: 7.2 %
Neutro Abs: 5291 cells/uL (ref 1500–7800)
Neutrophils Relative %: 48.1 %
Platelets: 305 10*3/uL (ref 140–400)
RBC: 4.87 10*6/uL (ref 3.80–5.10)
RDW: 12.8 % (ref 11.0–15.0)
Total Lymphocyte: 43.2 %
WBC: 11 10*3/uL — ABNORMAL HIGH (ref 3.8–10.8)

## 2022-02-04 LAB — PATHOLOGIST SMEAR REVIEW

## 2022-02-08 NOTE — Addendum Note (Signed)
Addended by: Loura Pardon A on: 02/08/2022 01:26 PM ? ? Modules accepted: Orders ? ?

## 2022-02-12 ENCOUNTER — Inpatient Hospital Stay: Payer: Medicare HMO | Attending: Oncology | Admitting: Oncology

## 2022-02-12 ENCOUNTER — Encounter: Payer: Self-pay | Admitting: Oncology

## 2022-02-12 ENCOUNTER — Inpatient Hospital Stay: Payer: Medicare HMO

## 2022-02-12 VITALS — BP 149/63 | HR 65 | Temp 96.5°F | Wt 136.0 lb

## 2022-02-12 DIAGNOSIS — D72820 Lymphocytosis (symptomatic): Secondary | ICD-10-CM

## 2022-02-12 DIAGNOSIS — Z79899 Other long term (current) drug therapy: Secondary | ICD-10-CM

## 2022-02-12 DIAGNOSIS — Z7982 Long term (current) use of aspirin: Secondary | ICD-10-CM

## 2022-02-12 LAB — CBC WITH DIFFERENTIAL/PLATELET
Abs Immature Granulocytes: 0.03 10*3/uL (ref 0.00–0.07)
Basophils Absolute: 0 10*3/uL (ref 0.0–0.1)
Basophils Relative: 0 %
Eosinophils Absolute: 0.1 10*3/uL (ref 0.0–0.5)
Eosinophils Relative: 1 %
HCT: 42.8 % (ref 36.0–46.0)
Hemoglobin: 14 g/dL (ref 12.0–15.0)
Immature Granulocytes: 0 %
Lymphocytes Relative: 43 %
Lymphs Abs: 4.1 10*3/uL — ABNORMAL HIGH (ref 0.7–4.0)
MCH: 29.3 pg (ref 26.0–34.0)
MCHC: 32.7 g/dL (ref 30.0–36.0)
MCV: 89.5 fL (ref 80.0–100.0)
Monocytes Absolute: 0.8 10*3/uL (ref 0.1–1.0)
Monocytes Relative: 8 %
Neutro Abs: 4.6 10*3/uL (ref 1.7–7.7)
Neutrophils Relative %: 48 %
Platelets: 278 10*3/uL (ref 150–400)
RBC: 4.78 MIL/uL (ref 3.87–5.11)
RDW: 12.7 % (ref 11.5–15.5)
WBC: 9.6 10*3/uL (ref 4.0–10.5)
nRBC: 0 % (ref 0.0–0.2)

## 2022-02-12 LAB — LACTATE DEHYDROGENASE: LDH: 168 U/L (ref 98–192)

## 2022-02-12 NOTE — Progress Notes (Signed)
Hematology/Oncology Consult note [phone] [phone]         Patient Care Team: Tower, Wynelle Fanny, MD as PCP - General Starling Manns, MD as Consulting Physician (Orthopedic Surgery)  REFERRING PROVIDER: Tower, Wynelle Fanny, MD  CHIEF COMPLAINTS/REASON FOR VISIT:  Evaluation of leukocytosis  HISTORY OF PRESENTING ILLNESS:   Linda Robinson is a  88 y.o.  female with PMH listed below was seen in consultation at the request of  Tower, Wynelle Fanny, MD  for evaluation of leukocytosis  Patient denies unintentional weight loss, night sweats, fever.  She lives by himself.  She has good performance status for her age. + Chronic hearing loss, occasionally she feels tired.  Reviewed patient's previous lab work.  Patient has had a chronic leukocytosis, predominantly lymphocytosis, dating back at least 2010.  Patient denies smoking, chronic steroid use.  Review of Systems  Constitutional:  Negative for appetite change, chills, fatigue and fever.  HENT:   Positive for hearing loss. Negative for voice change.   Eyes:  Negative for eye problems.  Respiratory:  Negative for chest tightness and cough.   Cardiovascular:  Negative for chest pain.  Gastrointestinal:  Negative for abdominal distention, abdominal pain and blood in stool.  Endocrine: Negative for hot flashes.  Genitourinary:  Negative for difficulty urinating and frequency.   Musculoskeletal:  Negative for arthralgias.  Skin:  Negative for itching and rash.  Neurological:  Negative for extremity weakness.  Hematological:  Negative for adenopathy.  Psychiatric/Behavioral:  Negative for confusion.    MEDICAL HISTORY:  Past Medical History:  Diagnosis Date   Ankylosis of lumbar spine    Basal cell carcinoma    removed   Cervical spine pain    Chest pain    a. 02/2017 MV: EF 54%, small, distal anterior wall/apical infacrt (likely misregistration), no ischemia-->Low risk; b. 02/2017 Echo: EF 55-60%, mild MR, PASP  53mHg.   Chronic back pain    stenosis   Constipation    takes Colace daily   Depression    takes Zoloft daily   Diverticulosis of colon    GERD (gastroesophageal reflux disease)    atypical chest pain   Gout    hx of;was on Allopurinol but taken off 6 wks ago by medical MD   History of diverticulitis of colon    HLD (hyperlipidemia)    takes Pravastatin daily   HTN (hypertension)    takes Amlodipine and carvedilol   Lumbar spondylosis with myelopathy    Numbness    OA (osteoarthritis)    right knee   Osteopenia    takes Calcium and Vit D daily   Scoliosis    Vertigo    takes Antivert daily as needed    SURGICAL HISTORY: Past Surgical History:  Procedure Laterality Date   APPENDECTOMY  at age 89  BACK SURGERY     CATARACT EXTRACTION Right 04/2006   CHOLECYSTECTOMY  1970   COLONOSCOPY     KNEE SURGERY Right    Medial meniscus tear   LUMBAR LAMINECTOMY/DECOMPRESSION MICRODISCECTOMY Bilateral 11/13/2014   Procedure: Lumbar one-two Bilateral laminectomy and foraminotomy, Left Lumbar one-two microdiscectomy ;  Surgeon: RFaythe Ghee MD;  Location: [facility];  Service: Neurosurgery;  Laterality: Bilateral;   NECK SURGERY  [phone]  x 3; fusions   PARTIAL COLECTOMY  09/1999   d/t diverticulitis   TUBAL LIGATION      SOCIAL HISTORY: Social History   Socioeconomic History   Marital status:  Widowed    Spouse name: Not on file   Number of children: 3   Years of education: 14   Highest education level: Associate degree: occupational, Hotel manager, or vocational program  Occupational History   Occupation: Retired  Tobacco Use   Smoking status: Never   Smokeless tobacco: Never  Scientific laboratory technician Use: Never used  Substance and Sexual Activity   Alcohol use: Never    Alcohol/week: 0.0 standard drinks   Drug use: Never   Sexual activity: Not Currently    Birth control/protection: Post-menopausal  Other Topics Concern   Not on file  Social History Narrative    Lives alone   Coffee 1/2 c   Social Determinants of Health   Financial Resource Strain: Not on file  Food Insecurity: Not on file  Transportation Needs: Not on file  Physical Activity: Not on file  Stress: Not on file  Social Connections: Not on file  Intimate Partner Violence: Not on file    FAMILY HISTORY: Family History  Problem Relation Age of Onset   Cancer Father        throat and bladder   Diabetes Father    Hypertension Father    Heart attack Father    Stroke Mother    Alzheimer's disease Mother    Hypertension Mother     ALLERGIES:  is allergic to ace inhibitors, bactrim [sulfamethoxazole-trimethoprim], cephalosporins, colesevelam, hctz [hydrochlorothiazide], losartan, lovastatin, lyrica [pregabalin], raloxifene, rosuvastatin, and simvastatin.  MEDICATIONS:  Current Outpatient Medications  Medication Sig Dispense Refill   amLODipine (NORVASC) 10 MG tablet Take 10 mg by mouth at bedtime.     aspirin EC 81 MG tablet Take 1 tablet (81 mg total) by mouth daily. 90 tablet 3   Calcium Carbonate-Vitamin D 500-125 MG-UNIT TABS Take by mouth.     Cranberry 250 MG CAPS Take 1 capsule by mouth daily.     docusate sodium (COLACE) 100 MG capsule Take 1-2 capsules (100-200 mg total) by mouth 2 (two) times daily. 100 mg in the morning and 200 mg at night 180 capsule 1   famotidine (PEPCID) 20 MG tablet TAKE 1 TABLET TWICE A DAY 180 tablet 3   fish oil-omega-3 fatty acids 1000 MG capsule Take 1 g by mouth at bedtime.     furosemide (LASIX) 20 MG tablet TAKE 1 TABLET DAILY AS     NEEDED FOR SWELLING 90 tablet 1   hydrALAZINE (APRESOLINE) 10 MG tablet Take 1 tablet (10 mg total) by mouth in the morning and at bedtime. 180 tablet 2   meclizine (ANTIVERT) 25 MG tablet Take 25 mg by mouth 4 (four) times daily as needed for dizziness.     pravastatin (PRAVACHOL) 20 MG tablet TAKE 1 TABLET DAILY 90 tablet 3   sertraline (ZOLOFT) 100 MG tablet Take 1.5 tablets (150 mg total) by mouth  daily. 135 tablet 3   Tetrahydrozoline HCl (EYE DROPS OP) Apply 1-2 drops to eye as needed (for dry eye).     No current facility-administered medications for this visit.     PHYSICAL EXAMINATION:  Vitals:   02/12/22 1456  BP: (!) 149/63  Pulse: 65  Temp: (!) 96.5 F (35.8 C)   Filed Weights   02/12/22 1456  Weight: 136 lb (61.7 kg)    Physical Exam Constitutional:      General: She is not in acute distress.    Comments:   Elderly frail appearance female  HENT:     Head: Normocephalic and atraumatic.  Eyes:     General: No scleral icterus. Cardiovascular:     Rate and Rhythm: Normal rate and regular rhythm.     Heart sounds: Normal heart sounds.  Pulmonary:     Effort: Pulmonary effort is normal. No respiratory distress.     Breath sounds: No wheezing.  Abdominal:     General: Bowel sounds are normal. There is no distension.     Palpations: Abdomen is soft.  Musculoskeletal:        General: No deformity. Normal range of motion.     Cervical back: Normal range of motion and neck supple.  Skin:    General: Skin is warm and dry.     Findings: No erythema or rash.  Neurological:     Mental Status: She is alert and oriented to person, place, and time. Mental status is at baseline.     Cranial Nerves: No cranial nerve deficit.     Coordination: Coordination normal.  Psychiatric:        Mood and Affect: Mood normal.    LABORATORY DATA:  I have reviewed the data as listed Lab Results  Component Value Date   WBC 9.6 02/12/2022   HGB 14.0 02/12/2022   HCT 42.8 02/12/2022   MCV 89.5 02/12/2022   PLT 278 02/12/2022   Recent Labs    06/04/21 1026 07/23/21 1516 10/17/21 1235 02/03/22 1126  NA 142 142 142 141  K 3.8 4.5 3.5 5.0  CL 100 99 102 100  CO2 32 34* 29 33*  GLUCOSE 86 108* 97 88  BUN 18 26* 25* 28*  CREATININE 1.04 1.39* 0.96 1.19  CALCIUM 9.4 10.0 9.6 10.0  GFRNONAA  --   --  56*  --   PROT 6.5 7.2  --  6.9  ALBUMIN 4.3 4.7  --  4.5  AST 17 20   --  17  ALT 10 10  --  10  ALKPHOS 59 67  --  62  BILITOT 0.5 0.6  --  0.5   Iron/TIBC/Ferritin/ %Sat    Component Value Date/Time   IRON 87 02/03/2022 1126   FERRITIN 33.2 02/03/2022 1126      RADIOGRAPHIC STUDIES: I have personally reviewed the radiological images as listed and agreed with the findings in the report. No results found.    ASSESSMENT & PLAN:  1. Lymphocytosis    #Lymphocytosis, reactive versus secondary to Malignant hematology disorders. Previous smear results were reviewed.There is atypical lymphocytes.  Suspected CLL Check CBC, peripheral flowCytology, LDH. Patient has no constitutional symptoms.  Orders Placed This Encounter  Procedures   CBC with Differential/Platelet    Standing Status:   Future    Number of Occurrences:   1    Standing Expiration Date:   02/13/2023   Lactate dehydrogenase    Standing Status:   Future    Number of Occurrences:   1    Standing Expiration Date:   02/13/2023   Flow cytometry panel-leukemia/lymphoma work-up    Standing Status:   Future    Number of Occurrences:   1    Standing Expiration Date:   02/13/2023    All questions were answered. The patient knows to call the clinic with any problems questions or concerns.  cc Tower, Wynelle Fanny, MD    Return of visit: 3 to 4 weeks to review results. Thank you for this kind referral and the opportunity to participate in the care of this patient. A copy of today's note is routed  to referring provider   Earlie Server, MD, PhD [facility] Hematology Oncology 02/12/2022

## 2022-02-16 LAB — COMP PANEL: LEUKEMIA/LYMPHOMA

## 2022-02-17 ENCOUNTER — Encounter: Payer: Medicare HMO | Admitting: Oncology

## 2022-02-17 ENCOUNTER — Other Ambulatory Visit: Payer: Medicare HMO

## 2022-02-20 ENCOUNTER — Other Ambulatory Visit: Payer: Self-pay | Admitting: Family Medicine

## 2022-03-05 ENCOUNTER — Encounter: Payer: Self-pay | Admitting: Oncology

## 2022-03-05 ENCOUNTER — Inpatient Hospital Stay: Payer: Medicare HMO | Attending: Oncology | Admitting: Oncology

## 2022-03-05 VITALS — BP 154/74 | HR 72 | Temp 97.1°F | Wt 136.0 lb

## 2022-03-05 DIAGNOSIS — D72820 Lymphocytosis (symptomatic): Secondary | ICD-10-CM | POA: Diagnosis not present

## 2022-03-05 NOTE — Progress Notes (Signed)
Hematology/Oncology Consult note [phone] [phone]         Patient Care Team: Tower, Wynelle Fanny, MD as PCP - General Starling Manns, MD as Consulting Physician (Orthopedic Surgery)  REFERRING PROVIDER: Tower, Wynelle Fanny, MD  CHIEF COMPLAINTS/REASON FOR VISIT:  Evaluation of leukocytosis  HISTORY OF PRESENTING ILLNESS:   Linda Robinson is a  88 y.o.  female with PMH listed below was seen in consultation at the request of  Tower, Wynelle Fanny, MD  for evaluation of leukocytosis  Patient denies unintentional weight loss, night sweats, fever.  She lives by himself.  She has good performance status for her age. + Chronic hearing loss, occasionally she feels tired.  Reviewed patient's previous lab work.  Patient has had a chronic leukocytosis, predominantly lymphocytosis, dating back at least 2010.  Patient denies smoking, chronic steroid use.  INTERVAL HISTORY Linda Robinson is a 88 y.o. female who has above history reviewed by me today presents for follow up visit for leukocytosis Patient has no new complaints.  Patient has chronic arthralgia.  She present to discuss results.   Review of Systems  Constitutional:  Negative for appetite change, chills, fatigue and fever.  HENT:   Positive for hearing loss. Negative for voice change.   Eyes:  Negative for eye problems.  Respiratory:  Negative for chest tightness and cough.   Cardiovascular:  Negative for chest pain.  Gastrointestinal:  Negative for abdominal distention, abdominal pain and blood in stool.  Endocrine: Negative for hot flashes.  Genitourinary:  Negative for difficulty urinating and frequency.   Musculoskeletal:  Positive for arthralgias.  Skin:  Negative for itching and rash.  Neurological:  Negative for extremity weakness.  Hematological:  Negative for adenopathy.  Psychiatric/Behavioral:  Negative for confusion.     MEDICAL HISTORY:  Past Medical History:  Diagnosis Date   Ankylosis of  lumbar spine    Basal cell carcinoma    removed   Cervical spine pain    Chest pain    a. 02/2017 MV: EF 54%, small, distal anterior wall/apical infacrt (likely misregistration), no ischemia-->Low risk; b. 02/2017 Echo: EF 55-60%, mild MR, PASP 73mHg.   Chronic back pain    stenosis   Constipation    takes Colace daily   Depression    takes Zoloft daily   Diverticulosis of colon    GERD (gastroesophageal reflux disease)    atypical chest pain   Gout    hx of;was on Allopurinol but taken off 6 wks ago by medical MD   History of diverticulitis of colon    HLD (hyperlipidemia)    takes Pravastatin daily   HTN (hypertension)    takes Amlodipine and carvedilol   Lumbar spondylosis with myelopathy    Numbness    OA (osteoarthritis)    right knee   Osteopenia    takes Calcium and Vit D daily   Scoliosis    Vertigo    takes Antivert daily as needed    SURGICAL HISTORY: Past Surgical History:  Procedure Laterality Date   APPENDECTOMY  at age 88  BACK SURGERY     CATARACT EXTRACTION Right 04/2006   CHOLECYSTECTOMY  1970   COLONOSCOPY     KNEE SURGERY Right    Medial meniscus tear   LUMBAR LAMINECTOMY/DECOMPRESSION MICRODISCECTOMY Bilateral 11/13/2014   Procedure: Lumbar one-two Bilateral laminectomy and foraminotomy, Left Lumbar one-two microdiscectomy ;  Surgeon: RFaythe Ghee MD;  Location: [facility];  Service: Neurosurgery;  Laterality: Bilateral;   NECK SURGERY  [phone]   x 3; fusions   PARTIAL COLECTOMY  09/1999   d/t diverticulitis   TUBAL LIGATION      SOCIAL HISTORY: Social History   Socioeconomic History   Marital status: Widowed    Spouse name: Not on file   Number of children: 3   Years of education: 14   Highest education level: Associate degree: occupational, Hotel manager, or vocational program  Occupational History   Occupation: Retired  Tobacco Use   Smoking status: Never   Smokeless tobacco: Never  Vaping Use   Vaping Use: Never used   Substance and Sexual Activity   Alcohol use: Never    Alcohol/week: 0.0 standard drinks of alcohol   Drug use: Never   Sexual activity: Not Currently    Birth control/protection: Post-menopausal  Other Topics Concern   Not on file  Social History Narrative   Lives alone   Coffee 1/2 c   Social Determinants of Health   Financial Resource Strain: Not on file  Food Insecurity: Not on file  Transportation Needs: Not on file  Physical Activity: Inactive (05/26/2019)   Exercise Vital Sign    Days of Exercise per Week: 0 days    Minutes of Exercise per Session: 0 min  Stress: Not on file  Social Connections: Not on file  Intimate Partner Violence: Not on file    FAMILY HISTORY: Family History  Problem Relation Age of Onset   Cancer Father        throat and bladder   Diabetes Father    Hypertension Father    Heart attack Father    Stroke Mother    Alzheimer's disease Mother    Hypertension Mother     ALLERGIES:  is allergic to ace inhibitors, bactrim [sulfamethoxazole-trimethoprim], cephalosporins, colesevelam, hctz [hydrochlorothiazide], losartan, lovastatin, lyrica [pregabalin], raloxifene, rosuvastatin, and simvastatin.  MEDICATIONS:  Current Outpatient Medications  Medication Sig Dispense Refill   amLODipine (NORVASC) 10 MG tablet Take 10 mg by mouth at bedtime.     aspirin EC 81 MG tablet Take 1 tablet (81 mg total) by mouth daily. 90 tablet 3   Calcium Carbonate-Vitamin D 500-125 MG-UNIT TABS Take by mouth.     Cranberry 250 MG CAPS Take 1 capsule by mouth daily.     docusate sodium (COLACE) 100 MG capsule Take 1-2 capsules (100-200 mg total) by mouth 2 (two) times daily. 100 mg in the morning and 200 mg at night 180 capsule 1   famotidine (PEPCID) 20 MG tablet TAKE 1 TABLET TWICE A DAY 180 tablet 3   fish oil-omega-3 fatty acids 1000 MG capsule Take 1 g by mouth at bedtime.     furosemide (LASIX) 20 MG tablet TAKE 1 TABLET DAILY AS     NEEDED FOR SWELLING 90 tablet  1   hydrALAZINE (APRESOLINE) 10 MG tablet Take 1 tablet (10 mg total) by mouth in the morning and at bedtime. 180 tablet 2   meclizine (ANTIVERT) 25 MG tablet Take 25 mg by mouth 4 (four) times daily as needed for dizziness.     pravastatin (PRAVACHOL) 20 MG tablet TAKE 1 TABLET DAILY 90 tablet 3   sertraline (ZOLOFT) 100 MG tablet Take 1.5 tablets (150 mg total) by mouth daily. 135 tablet 3   Tetrahydrozoline HCl (EYE DROPS OP) Apply 1-2 drops to eye as needed (for dry eye).     No current facility-administered medications for this visit.     PHYSICAL EXAMINATION:  Vitals:  03/05/22 1428  BP: (!) 154/74  Pulse: 72  Temp: (!) 97.1 F (36.2 C)   Filed Weights   03/05/22 1428  Weight: 136 lb (61.7 kg)    Physical Exam Constitutional:      General: She is not in acute distress.    Comments:   Elderly frail appearance female  HENT:     Head: Normocephalic and atraumatic.  Eyes:     General: No scleral icterus. Cardiovascular:     Rate and Rhythm: Normal rate and regular rhythm.     Heart sounds: Normal heart sounds.  Pulmonary:     Effort: Pulmonary effort is normal. No respiratory distress.     Breath sounds: No wheezing.  Abdominal:     General: Bowel sounds are normal. There is no distension.     Palpations: Abdomen is soft.  Musculoskeletal:        General: No deformity. Normal range of motion.     Cervical back: Normal range of motion and neck supple.  Skin:    General: Skin is warm and dry.     Findings: No erythema or rash.  Neurological:     Mental Status: She is alert and oriented to person, place, and time. Mental status is at baseline.     Cranial Nerves: No cranial nerve deficit.     Coordination: Coordination normal.  Psychiatric:        Mood and Affect: Mood normal.     LABORATORY DATA:  I have reviewed the data as listed Lab Results  Component Value Date   WBC 9.6 02/12/2022   HGB 14.0 02/12/2022   HCT 42.8 02/12/2022   MCV 89.5 02/12/2022    PLT 278 02/12/2022   Recent Labs    06/04/21 1026 07/23/21 1516 10/17/21 1235 02/03/22 1126  NA 142 142 142 141  K 3.8 4.5 3.5 5.0  CL 100 99 102 100  CO2 32 34* 29 33*  GLUCOSE 86 108* 97 88  BUN 18 26* 25* 28*  CREATININE 1.04 1.39* 0.96 1.19  CALCIUM 9.4 10.0 9.6 10.0  GFRNONAA  --   --  56*  --   PROT 6.5 7.2  --  6.9  ALBUMIN 4.3 4.7  --  4.5  AST 17 20  --  17  ALT 10 10  --  10  ALKPHOS 59 67  --  62  BILITOT 0.5 0.6  --  0.5    Iron/TIBC/Ferritin/ %Sat    Component Value Date/Time   IRON 87 02/03/2022 1126   FERRITIN 33.2 02/03/2022 1126      RADIOGRAPHIC STUDIES: I have personally reviewed the radiological images as listed and agreed with the findings in the report. No results found.    ASSESSMENT & PLAN:  1. Lymphocytosis    #Lymphocytosis, Labs reviewed and discussed with patient. CBC showed no leukocytosis, she has increased absolute lymphocyte count. Flow cytometry showed no diagnostic immunophenotypic abnormality.  Absolute lymphocytosis is due to an absolute increase in CD4+ T cells and CD8+ T cells.  Increase in multiple lymphocyte subsets often suggest a reactive lymphocytosis.  Patient also does not have any constitutional symptoms I recommend observation Lymphocytosis likely reactive, could be related to chronic inflammation secondary to arthritis.  She has arthralgia.  I encourage patient to continue follow-up with primary care for further discussion. Patient will not need to follow-up actively with hematology at this point.  She may reestablish care in the future if needed.  Patient agrees with the plan.  All questions were answered. The patient knows to call the clinic with any problems questions or concerns.  cc Tower, Wynelle Fanny, MD   Earlie Server, MD, PhD [facility] Hematology Oncology 03/05/2022

## 2022-03-06 DIAGNOSIS — R42 Dizziness and giddiness: Secondary | ICD-10-CM | POA: Diagnosis not present

## 2022-03-06 DIAGNOSIS — R55 Syncope and collapse: Secondary | ICD-10-CM | POA: Diagnosis not present

## 2022-03-06 DIAGNOSIS — R531 Weakness: Secondary | ICD-10-CM | POA: Diagnosis not present

## 2022-03-10 ENCOUNTER — Ambulatory Visit (INDEPENDENT_AMBULATORY_CARE_PROVIDER_SITE_OTHER): Payer: Medicare HMO | Admitting: Family Medicine

## 2022-03-10 ENCOUNTER — Encounter: Payer: Self-pay | Admitting: Family Medicine

## 2022-03-10 VITALS — BP 128/78 | HR 73 | Temp 97.9°F | Resp 16 | Ht 60.0 in | Wt 134.1 lb

## 2022-03-10 DIAGNOSIS — K5900 Constipation, unspecified: Secondary | ICD-10-CM

## 2022-03-10 DIAGNOSIS — R531 Weakness: Secondary | ICD-10-CM

## 2022-03-10 DIAGNOSIS — D72829 Elevated white blood cell count, unspecified: Secondary | ICD-10-CM

## 2022-03-10 DIAGNOSIS — R69 Illness, unspecified: Secondary | ICD-10-CM | POA: Diagnosis not present

## 2022-03-10 DIAGNOSIS — F418 Other specified anxiety disorders: Secondary | ICD-10-CM | POA: Diagnosis not present

## 2022-03-10 DIAGNOSIS — R296 Repeated falls: Secondary | ICD-10-CM | POA: Insufficient documentation

## 2022-03-10 NOTE — Progress Notes (Signed)
Subjective:    Patient ID: Linda Robinson, female    DOB: 1929-09-18, 88 y.o.   MRN: [id number]  HPI Pt presents for f/u of HTN and episodes of weakness and depression with anxiety   Wt Readings from Last 3 Encounters:  03/10/22 134 lb 2 oz (60.8 kg)  03/05/22 136 lb (61.7 kg)  02/12/22 136 lb (61.7 kg)   26.19 kg/m  Appetite is ok/ 3 meals daily   HTN bp in fair control at this time  BP Readings from Last 3 Encounters:  03/10/22 128/78  03/05/22 (!) 154/74  02/12/22 (!) 149/63    No changes needed Most recent labs reviewed  Disc lifstyle change with low sodium diet and exercise    Amlodipine 10 mg daily  Lasix 20 mg daily  Hydralazine 10 mg bid (does not tolerate tid)   Lab Results  Component Value Date   CREATININE 1.19 02/03/2022   BUN 28 (H) 02/03/2022   NA 141 02/03/2022   K 5.0 02/03/2022   CL 100 02/03/2022   CO2 33 (H) 02/03/2022  GFR 40.13 Fluid intake  Really trying to drink fluids    Water and tea and coffee   Last visit we inc sertraline for anxiety/depression and stress reaction with chronic pain  Thinks that has helped/less anxious  100 in am and then 50 at bedtime/prefers this  Sleeping better   Was sent to hematology for leukocytosis  Had reassuring w/u with nl flow cytometry  (not CLL currently) Has lymphocytosis, with rec to obs/likely reactive to her arthritis No f/u recommended /just prn    Lab Results  Component Value Date   WBC 9.6 02/12/2022   HGB 14.0 02/12/2022   HCT 42.8 02/12/2022   MCV 89.5 02/12/2022   PLT 278 02/12/2022     Discussed chronic spells of weakness and encouraged to take frequent breaks Has had falls  She went to the grocery store Friday  She felt weak and fell in the parking lot and fell   (could not get a handicapped spot and it was too far) EMS came and she declined ER  EKG was normal and no injuries  That was the first one in a few weeks   She does NOT want to leave her home Son lives next  door /is always there /he is retired   Bp was 102 /70  Does not want to go to cardiology  Thinks this always happens after she exerts herself too much   Son does her [place] daughter helps out  [place] brings meals 3 days per week    Constipation  Recommended trial of low dose daily magnesium for constipation and anxiety  Does not think the magnesium has helped much  Still does a dose of MOM as needed  Cannot go without it   Prediabetes Lab Results  Component Value Date   HGBA1C 5.5 02/03/2022     Lab Results  Component Value Date   ALT 10 02/03/2022   AST 17 02/03/2022   ALKPHOS 62 02/03/2022   BILITOT 0.5 02/03/2022   Lab Results  Component Value Date   TSH 2.91 02/03/2022   Patient Active Problem List   Diagnosis Date Noted   Falls frequently 03/10/2022   Osteoarthritis of both knees 10/29/2021   Lower abdominal pain 07/23/2021   Frequency of urination 07/23/2021   Diverticulosis 07/23/2021   Cervical radicular pain (left) 09/12/2020   Cervical disc disorder with radiculopathy of cervical region 09/12/2020  Cervical spondylosis 09/12/2020   Cervical fusion syndrome (C4-T1 ACDF) 09/12/2020   Neuropathic pain of hand, left 09/12/2020   Chronic pain syndrome 09/12/2020   Failed neck syndrome 07/30/2020   Left hand pain 07/30/2020   Weakness 05/01/2020   Grief reaction 11/17/2019   Cervical stenosis of spinal canal 11/17/2019   Hearing loss 11/17/2019   Poor balance 06/12/2019   Screening mammogram, encounter for 05/23/2018   Substernal chest pain 08/16/2017   Atherosclerosis of aorta ([place]) 03/09/2017   Weight loss 02/23/2017   Routine general medical examination at a health care facility 07/05/2015   Estrogen deficiency 07/05/2015   Prediabetes 01/01/2015   Lumbar spinal stenosis 11/13/2014   Vertigo 10/05/2014   Leukocytosis 07/16/2014   Risk for falls 06/27/2014   Low back pain on right side with sciatica 07/24/2013   Edema 04/26/2013    Encounter for Medicare annual wellness exam 04/11/2013   Lymphocytosis 01/05/2012   GERD (gastroesophageal reflux disease)    Osteopenia 01/30/2009   Constipation 11/01/2007   Depression with anxiety 02/14/2007   Hyperlipidemia LDL goal <130 02/04/2007   Gout 02/04/2007   Essential hypertension 02/04/2007   DIVERTICULOSIS, COLON 02/04/2007   Renal insufficiency 02/04/2007   OSTEOARTHRITIS 02/04/2007   DEGENERATIVE [place] DISEASE 02/04/2007   Past Medical History:  Diagnosis Date   Ankylosis of lumbar spine    Basal cell carcinoma    removed   Cervical spine pain    Chest pain    a. 02/2017 MV: EF 54%, small, distal anterior wall/apical infacrt (likely misregistration), no ischemia-->Low risk; b. 02/2017 Echo: EF 55-60%, mild MR, PASP 73mHg.   Chronic back pain    stenosis   Constipation    takes Colace daily   Depression    takes Zoloft daily   Diverticulosis of colon    GERD (gastroesophageal reflux disease)    atypical chest pain   Gout    hx of;was on Allopurinol but taken off 6 wks ago by medical MD   History of diverticulitis of colon    HLD (hyperlipidemia)    takes Pravastatin daily   HTN (hypertension)    takes Amlodipine and carvedilol   Lumbar spondylosis with myelopathy    Numbness    OA (osteoarthritis)    right knee   Osteopenia    takes Calcium and Vit D daily   Scoliosis    Vertigo    takes Antivert daily as needed   Past Surgical History:  Procedure Laterality Date   [place] 88  [place] [facility] [facility]   CHOLECYSTECTOMY  1970   COLONOSCOPY     KNEE SURGERY Right    Medial meniscus tear   LUMBAR LAMINECTOMY/DECOMPRESSION MICRODISCECTOMY Bilateral 11/13/2014   Procedure: Lumbar one-two Bilateral laminectomy and foraminotomy, Left Lumbar one-two microdiscectomy ;  Surgeon: RFaythe Ghee MD;  Location: [facility];  Service: Neurosurgery;  Laterality: Bilateral;   NECK SURGERY  [phone]  x 3; fusions    PARTIAL COLECTOMY  09/1999   d/t diverticulitis   TUBAL LIGATION     Social History   Tobacco Use   Smoking status: Never   Smokeless tobacco: Never  Vaping Use   Vaping Use: Never used  Substance Use Topics   Alcohol use: Never    Alcohol/week: 0.0 standard drinks of alcohol   Drug use: Never   Family History  Problem Relation Age of Onset   Cancer Father  throat and bladder   Diabetes Father    Hypertension Father    Heart attack Father    Stroke Mother    Alzheimer's disease Mother    Hypertension Mother    Allergies  Allergen Reactions   Ace Inhibitors     REACTION: increased K+, increased creat.   Bactrim [Sulfamethoxazole-Trimethoprim]     rash   Cephalosporins     REACTION: reaction not known   Colesevelam     REACTION: constipation   Hctz [Hydrochlorothiazide]     Makes her feel "bad"   Losartan     Increased K   Lovastatin     REACTION: doesn't work   Lyrica [Pregabalin]     Felt bad    Raloxifene     REACTION: cramps   Rosuvastatin     REACTION: myalgia   Simvastatin     REACTION: myalgia   Current Outpatient Medications on File Prior to Visit  Medication Sig Dispense Refill   amLODipine (NORVASC) 10 MG tablet Take 10 mg by mouth at bedtime.     aspirin EC 81 MG tablet Take 1 tablet (81 mg total) by mouth daily. 90 tablet 3   Calcium Carbonate-Vitamin D 500-125 MG-UNIT TABS Take by mouth.     Cranberry 250 MG CAPS Take 1 capsule by mouth daily.     docusate sodium (COLACE) 100 MG capsule Take 1-2 capsules (100-200 mg total) by mouth 2 (two) times daily. 100 mg in the morning and 200 mg at night 180 capsule 1   famotidine (PEPCID) 20 MG tablet TAKE 1 TABLET TWICE A DAY 180 tablet 3   fish oil-omega-3 fatty acids 1000 MG capsule Take 1 g by mouth at bedtime.     furosemide (LASIX) 20 MG tablet TAKE 1 TABLET DAILY AS     NEEDED FOR SWELLING 90 tablet 1   hydrALAZINE (APRESOLINE) 10 MG tablet Take 1 tablet (10 mg total) by mouth in the  morning and at bedtime. 180 tablet 2   meclizine (ANTIVERT) 25 MG tablet Take 25 mg by mouth 4 (four) times daily as needed for dizziness.     pravastatin (PRAVACHOL) 20 MG tablet TAKE 1 TABLET DAILY 90 tablet 3   sertraline (ZOLOFT) 100 MG tablet Take 1.5 tablets (150 mg total) by mouth daily. 135 tablet 3   Tetrahydrozoline HCl (EYE DROPS OP) Apply 1-2 drops to eye as needed (for dry eye).     No current facility-administered medications on file prior to visit.      Review of Systems  Constitutional:  Positive for fatigue. Negative for activity change, appetite change, fever and unexpected weight change.  HENT:  Negative for congestion, rhinorrhea, sore throat and trouble swallowing.   Eyes:  Negative for pain, redness, itching and visual disturbance.  Respiratory:  Negative for cough, chest tightness, shortness of breath and wheezing.   Cardiovascular:  Negative for chest pain and palpitations.  Gastrointestinal:  Positive for constipation. Negative for abdominal distention, abdominal pain, anal bleeding, blood in stool, diarrhea and nausea.  Endocrine: Negative for cold intolerance, heat intolerance, polydipsia and polyuria.  Genitourinary:  Negative for difficulty urinating, dysuria, frequency and urgency.  Musculoskeletal:  Negative for arthralgias, joint swelling and myalgias.  Skin:  Negative for pallor and rash.  Neurological:  Positive for weakness. Negative for dizziness, tremors, numbness and headaches.       Gen weakness  Focal in L hand due to injury -baseline   Hematological:  Negative for adenopathy. Does not bruise/bleed  easily.  Psychiatric/Behavioral:  Negative for decreased concentration and dysphoric mood. The patient is not nervous/anxious.        Mood is better       Objective:   Physical Exam Constitutional:      General: She is not in acute distress.    Appearance: Normal appearance. She is well-developed and normal weight. She is not ill-appearing or  diaphoretic.  HENT:     Head: Normocephalic and atraumatic.  Eyes:     Conjunctiva/sclera: Conjunctivae normal.     Pupils: Pupils are equal, round, and reactive to light.  Neck:     Thyroid: No thyromegaly.     Vascular: No carotid bruit or JVD.  Cardiovascular:     Rate and Rhythm: Normal rate and regular rhythm.     Heart sounds: Normal heart sounds.     No gallop.  Pulmonary:     Effort: Pulmonary effort is normal. No respiratory distress.     Breath sounds: Normal breath sounds. No wheezing or rales.  Abdominal:     General: There is no distension or abdominal bruit.     Palpations: Abdomen is soft.  Musculoskeletal:     Cervical back: Normal range of motion and neck supple.     Right lower leg: No edema.     Left lower leg: No edema.     Comments: Kyphosis   Atrophy of L hand with fair grip  Lymphadenopathy:     Cervical: No cervical adenopathy.  Skin:    General: Skin is warm and dry.     Coloration: Skin is not pale.     Findings: No rash.  Neurological:     Mental Status: She is alert.     Coordination: Coordination normal.     Deep Tendon Reflexes: Reflexes are normal and symmetric. Reflexes normal.     Comments: Unsteady gait  Uses cane on L and hangs on to things with R hand   Baseline L hand weakness   Generally weak Struggles to get out of chair unassisted   Psychiatric:        Mood and Affect: Mood normal.     Comments: Improved mood today  Less anx           Assessment & Plan:   Problem List Items Addressed This Visit       Other   Constipation    No improvement with daily magnesium Pt continues to use MOM prn instead       Depression with anxiety    Improved with inc in sertraline to 150 mg daily  She takes 100 am, 50 pm and says that works best Reviewed stressors/ coping techniques/symptoms/ support sources/ tx options and side effects in detail today       Falls frequently - Primary    Recent "weak spells" are always  triggered by exhaustion  Poor conditioning and baseline weak in L hand  No cardiac symptoms and bp is stable Disc plan to use walker ALL the time/not cane  Do not walk long distances re: has to have handicapped parking Take breaks when tired  HH referral for PT to inc strength and help with fall prev        Leukocytosis    Saw hematology  Thought to be reactive  Lymphocytosis Will monitor       Weakness    Acute on chronic  Spells occur only with exhaustion now and cause falls   HH PT ordered

## 2022-03-10 NOTE — Assessment & Plan Note (Signed)
No improvement with daily magnesium Pt continues to use MOM prn instead

## 2022-03-10 NOTE — Patient Instructions (Addendum)
I think you need to use the walker instead of the cane    You need to take frequent breaks when you are working and walking  If there is no handicapped spot available - then it is better to go at a different time   Use the walker!   I will place an order for home physical therapy  If you don't hear from them in 2 weeks let me know  If you fall again let us know   If you are interested in the new shingles vaccine (Shingrix) - call your local pharmacy to check on coverage and availability  If affordable, get on a wait list at your pharmacy to get the vaccine.

## 2022-03-10 NOTE — Assessment & Plan Note (Signed)
Improved with inc in sertraline to 150 mg daily  She takes 100 am, 50 pm and says that works best Reviewed stressors/ coping techniques/symptoms/ support sources/ tx options and side effects in detail today

## 2022-03-10 NOTE — Assessment & Plan Note (Signed)
Recent "weak spells" are always triggered by exhaustion  Poor conditioning and baseline weak in L hand  No cardiac symptoms and bp is stable Disc plan to use walker ALL the time/not cane  Do not walk long distances re: has to have handicapped parking Take breaks when tired  [facility] referral for PT to inc strength and help with fall prev

## 2022-03-10 NOTE — Assessment & Plan Note (Signed)
Acute on chronic  Spells occur only with exhaustion now and cause falls   HH PT ordered

## 2022-03-10 NOTE — Assessment & Plan Note (Signed)
Saw hematology  Thought to be reactive  Lymphocytosis Will monitor

## 2022-03-23 ENCOUNTER — Telehealth: Payer: Self-pay

## 2022-03-23 DIAGNOSIS — R2681 Unsteadiness on feet: Secondary | ICD-10-CM | POA: Diagnosis not present

## 2022-03-23 DIAGNOSIS — M1712 Unilateral primary osteoarthritis, left knee: Secondary | ICD-10-CM | POA: Diagnosis not present

## 2022-03-23 DIAGNOSIS — I700 Atherosclerosis of aorta: Secondary | ICD-10-CM | POA: Diagnosis not present

## 2022-03-23 DIAGNOSIS — M1711 Unilateral primary osteoarthritis, right knee: Secondary | ICD-10-CM | POA: Diagnosis not present

## 2022-03-23 DIAGNOSIS — I10 Essential (primary) hypertension: Secondary | ICD-10-CM | POA: Diagnosis not present

## 2022-03-23 DIAGNOSIS — R69 Illness, unspecified: Secondary | ICD-10-CM | POA: Diagnosis not present

## 2022-03-23 DIAGNOSIS — R296 Repeated falls: Secondary | ICD-10-CM | POA: Diagnosis not present

## 2022-03-23 DIAGNOSIS — K219 Gastro-esophageal reflux disease without esophagitis: Secondary | ICD-10-CM | POA: Diagnosis not present

## 2022-03-23 DIAGNOSIS — M6281 Muscle weakness (generalized): Secondary | ICD-10-CM | POA: Diagnosis not present

## 2022-03-24 ENCOUNTER — Encounter: Payer: Self-pay | Admitting: Family Medicine

## 2022-03-24 NOTE — Telephone Encounter (Signed)
Please ok that verbal order Thanks  

## 2022-03-24 NOTE — Telephone Encounter (Signed)
Called and spoke with the [facility] , gave her the verbal okay for PT

## 2022-04-01 ENCOUNTER — Encounter: Payer: Self-pay | Admitting: Family Medicine

## 2022-04-06 NOTE — Telephone Encounter (Signed)
Spoke to patient by telephone and appointment scheduled with Dr. Glori Bickers 04/14/22 at 9:30. Medication was updated.

## 2022-04-06 NOTE — Telephone Encounter (Signed)
Cc pcp as fyi.

## 2022-04-14 ENCOUNTER — Ambulatory Visit (INDEPENDENT_AMBULATORY_CARE_PROVIDER_SITE_OTHER): Payer: Medicare HMO | Admitting: Family Medicine

## 2022-04-14 ENCOUNTER — Encounter: Payer: Self-pay | Admitting: Family Medicine

## 2022-04-14 VITALS — BP 138/74 | HR 70 | Ht 60.0 in | Wt 134.0 lb

## 2022-04-14 DIAGNOSIS — N289 Disorder of kidney and ureter, unspecified: Secondary | ICD-10-CM | POA: Diagnosis not present

## 2022-04-14 DIAGNOSIS — R634 Abnormal weight loss: Secondary | ICD-10-CM

## 2022-04-14 DIAGNOSIS — R69 Illness, unspecified: Secondary | ICD-10-CM | POA: Diagnosis not present

## 2022-04-14 DIAGNOSIS — R609 Edema, unspecified: Secondary | ICD-10-CM | POA: Diagnosis not present

## 2022-04-14 DIAGNOSIS — F418 Other specified anxiety disorders: Secondary | ICD-10-CM

## 2022-04-14 DIAGNOSIS — R531 Weakness: Secondary | ICD-10-CM | POA: Diagnosis not present

## 2022-04-14 DIAGNOSIS — I10 Essential (primary) hypertension: Secondary | ICD-10-CM

## 2022-04-14 LAB — BASIC METABOLIC PANEL
BUN: 30 mg/dL — ABNORMAL HIGH (ref 6–23)
CO2: 27 mEq/L (ref 19–32)
Calcium: 9.7 mg/dL (ref 8.4–10.5)
Chloride: 100 mEq/L (ref 96–112)
Creatinine, Ser: 1.07 mg/dL (ref 0.40–1.20)
GFR: 45.52 mL/min — ABNORMAL LOW (ref >60.00)
Glucose, Bld: 129 mg/dL — ABNORMAL HIGH (ref 70–99)
Potassium: 4.4 mEq/L (ref 3.5–5.1)
Sodium: 138 mEq/L (ref 135–145)

## 2022-04-14 MED ORDER — FAMOTIDINE 20 MG PO TABS: 20.0000 mg | ORAL_TABLET | Freq: Every day | ORAL | 0 refills | Status: DC

## 2022-04-14 NOTE — Assessment & Plan Note (Signed)
Doing well with sertraline 150 mg daily  Mood was good today  Less anxious

## 2022-04-14 NOTE — Progress Notes (Signed)
Subjective:    Patient ID: Linda Robinson, female    DOB: 1929-04-03, 88 y.o.   MRN: [id number]  HPI Pt presents for f/u of HTN   Wt Readings from Last 3 Encounters:  04/14/22 134 lb (60.8 kg)  03/10/22 134 lb 2 oz (60.8 kg)  03/05/22 136 lb (61.7 kg)   26.17 kg/m  Wt at home varies  Holding fluid pill   Feels about the same  Less weakness  She has to be careful about not standing for prolonged periods of time    Bp at home  Avg 120s to 140s over 70s Once over 150  Pulse usually 60s , one at 57    HTN  bp is stable today  No cp or palpitations or headaches or edema  No side effects to medicines  BP Readings from Last 3 Encounters:  04/14/22 138/74  03/10/22 128/78  03/05/22 (!) 154/74     Amlodipine 10 mg daily at bedtime Lasix 20 mg daily - holding this since late June  Wt is stable and not swelling so she does not need it  Wants to keep on her list  Hydralazine 10 mg bid  Pulse Readings from Last 3 Encounters:  04/14/22 70  03/10/22 73  03/05/22 72    Lab Results  Component Value Date   CREATININE 1.19 02/03/2022   BUN 28 (H) 02/03/2022   NA 141 02/03/2022   K 5.0 02/03/2022   CL 100 02/03/2022   CO2 33 (H) 02/03/2022   GFR 40.1   Takes pepcid just at night  That works well   Doing her PT   Drinking more water for kidneys    Patient Active Problem List   Diagnosis Date Noted   Falls frequently 03/10/2022   Osteoarthritis of both knees 10/29/2021   Lower abdominal pain 07/23/2021   Diverticulosis 07/23/2021   Cervical radicular pain (left) 09/12/2020   Cervical disc disorder with radiculopathy of cervical region 09/12/2020   Cervical spondylosis 09/12/2020   Cervical fusion syndrome (C4-T1 ACDF) 09/12/2020   Neuropathic pain of hand, left 09/12/2020   Chronic pain syndrome 09/12/2020   Failed neck syndrome 07/30/2020   Left hand pain 07/30/2020   Weakness 05/01/2020   Grief reaction 11/17/2019   Cervical stenosis of spinal  canal 11/17/2019   Hearing loss 11/17/2019   Poor balance 06/12/2019   Screening mammogram, encounter for 05/23/2018   Substernal chest pain 08/16/2017   Atherosclerosis of aorta ([place]) 03/09/2017   Weight loss 02/23/2017   Routine general medical examination at a health care facility 07/05/2015   Estrogen deficiency 07/05/2015   Prediabetes 01/01/2015   Lumbar spinal stenosis 11/13/2014   History of vertigo 10/05/2014   Leukocytosis 07/16/2014   Risk for falls 06/27/2014   Low back pain on right side with sciatica 07/24/2013   Edema 04/26/2013   Encounter for Medicare annual wellness exam 04/11/2013   Lymphocytosis 01/05/2012   GERD (gastroesophageal reflux disease)    Osteopenia 01/30/2009   Constipation 11/01/2007   Depression with anxiety 02/14/2007   Hyperlipidemia LDL goal <130 02/04/2007   Gout 02/04/2007   Essential hypertension 02/04/2007   DIVERTICULOSIS, COLON 02/04/2007   Renal insufficiency 02/04/2007   OSTEOARTHRITIS 02/04/2007   DEGENERATIVE [place] DISEASE 02/04/2007   Past Medical History:  Diagnosis Date   Ankylosis of lumbar spine    Basal cell carcinoma    removed   Cervical spine pain    Chest pain    a. 02/2017 MV:  EF 54%, small, distal anterior wall/apical infacrt (likely misregistration), no ischemia-->Low risk; b. 02/2017 Echo: EF 55-60%, mild MR, PASP 8mHg.   Chronic back pain    stenosis   Constipation    takes Colace daily   Depression    takes Zoloft daily   Diverticulosis of colon    GERD (gastroesophageal reflux disease)    atypical chest pain   Gout    hx of;was on Allopurinol but taken off 6 wks ago by medical MD   History of diverticulitis of colon    HLD (hyperlipidemia)    takes Pravastatin daily   HTN (hypertension)    takes Amlodipine and carvedilol   Lumbar spondylosis with myelopathy    Numbness    OA (osteoarthritis)    right knee   Osteopenia    takes Calcium and Vit D daily   Scoliosis    Vertigo    takes Antivert  daily as needed   Past Surgical History:  Procedure Laterality Date   APPENDECTOMY  at age 88  [place] 04/2006   CHOLECYSTECTOMY  1970   COLONOSCOPY     KNEE SURGERY Right    Medial meniscus tear   LUMBAR LAMINECTOMY/DECOMPRESSION MICRODISCECTOMY Bilateral 11/13/2014   Procedure: Lumbar one-two Bilateral laminectomy and foraminotomy, Left Lumbar one-two microdiscectomy ;  Surgeon: RFaythe Ghee MD;  Location: [facility];  Service: Neurosurgery;  Laterality: Bilateral;   NECK SURGERY  [phone]  x 3; fusions   PARTIAL COLECTOMY  09/1999   d/t diverticulitis   TUBAL LIGATION     Social History   Tobacco Use   Smoking status: Never   Smokeless tobacco: Never  Vaping Use   Vaping Use: Never used  Substance Use Topics   Alcohol use: Never    Alcohol/week: 0.0 standard drinks of alcohol   Drug use: Never   Family History  Problem Relation Age of Onset   Cancer Father        throat and bladder   Diabetes Father    Hypertension Father    Heart attack Father    Stroke Mother    Alzheimer's disease Mother    Hypertension Mother    Allergies  Allergen Reactions   Ace Inhibitors     REACTION: increased K+, increased creat.   Bactrim [Sulfamethoxazole-Trimethoprim]     rash   Cephalosporins     REACTION: reaction not known   Colesevelam     REACTION: constipation   Hctz [Hydrochlorothiazide]     Makes her feel "bad"   Losartan     Increased K   Lovastatin     REACTION: doesn't work   Lyrica [Pregabalin]     Felt bad    Raloxifene     REACTION: cramps   Rosuvastatin     REACTION: myalgia   Simvastatin     REACTION: myalgia   Current Outpatient Medications on File Prior to Visit  Medication Sig Dispense Refill   amLODipine (NORVASC) 10 MG tablet Take 10 mg by mouth at bedtime.     aspirin EC 81 MG tablet Take 1 tablet (81 mg total) by mouth daily. 90 tablet 3   Calcium Carbonate-Vitamin D 500-125 MG-UNIT TABS Take by  mouth.     Cranberry 250 MG CAPS Take 1 capsule by mouth daily.     docusate sodium (COLACE) 100 MG capsule Take 1-2 capsules (100-200 mg total) by mouth 2 (two) times daily. 100 mg in  the morning and 200 mg at night 180 capsule 1   fish oil-omega-3 fatty acids 1000 MG capsule Take 1 g by mouth at bedtime.     hydrALAZINE (APRESOLINE) 10 MG tablet Take 1 tablet (10 mg total) by mouth in the morning and at bedtime. 180 tablet 2   meclizine (ANTIVERT) 25 MG tablet Take 25 mg by mouth 4 (four) times daily as needed for dizziness.     pravastatin (PRAVACHOL) 20 MG tablet TAKE 1 TABLET DAILY 90 tablet 3   sertraline (ZOLOFT) 100 MG tablet Take 1.5 tablets (150 mg total) by mouth daily. 135 tablet 3   Tetrahydrozoline HCl (EYE DROPS OP) Apply 1-2 drops to eye as needed (for dry eye).     furosemide (LASIX) 20 MG tablet TAKE 1 TABLET DAILY AS     NEEDED FOR SWELLING (Patient not taking: Reported on 04/14/2022) 90 tablet 1   No current facility-administered medications on file prior to visit.    Review of Systems  Constitutional:  Negative for activity change, appetite change, fatigue, fever and unexpected weight change.  HENT:  Negative for congestion, ear pain, rhinorrhea, sinus pressure and sore throat.   Eyes:  Negative for pain, redness and visual disturbance.  Respiratory:  Negative for cough, shortness of breath and wheezing.   Cardiovascular:  Negative for chest pain and palpitations.  Gastrointestinal:  Negative for abdominal pain, blood in stool, constipation and diarrhea.  Endocrine: Negative for polydipsia and polyuria.  Genitourinary:  Negative for dysuria, frequency and urgency.  Musculoskeletal:  Positive for arthralgias and back pain. Negative for myalgias.  Skin:  Negative for pallor and rash.  Allergic/Immunologic: Negative for environmental allergies.  Neurological:  Positive for numbness. Negative for dizziness, syncope and headaches.       No recent weak spells Doing better   Avoids standing for long periods  Baseline hand numbness  Hematological:  Negative for adenopathy. Does not bruise/bleed easily.  Psychiatric/Behavioral:  Negative for decreased concentration and dysphoric mood. The patient is not nervous/anxious.        Objective:   Physical Exam Constitutional:      General: She is not in acute distress.    Appearance: Normal appearance. She is well-developed and normal weight. She is not ill-appearing or diaphoretic.  HENT:     Head: Normocephalic and atraumatic.     Mouth/Throat:     Mouth: Mucous membranes are moist.  Eyes:     Conjunctiva/sclera: Conjunctivae normal.     Pupils: Pupils are equal, round, and reactive to light.  Neck:     Thyroid: No thyromegaly.     Vascular: No carotid bruit or JVD.  Cardiovascular:     Rate and Rhythm: Normal rate and regular rhythm.     Heart sounds: Normal heart sounds.     No gallop.  Pulmonary:     Effort: Pulmonary effort is normal. No respiratory distress.     Breath sounds: Normal breath sounds. No wheezing or rales.  Abdominal:     General: There is no distension or abdominal bruit.     Palpations: Abdomen is soft.     Tenderness: There is no abdominal tenderness.  Musculoskeletal:     Cervical back: Normal range of motion and neck supple.     Right lower leg: No edema.     Left lower leg: No edema.  Lymphadenopathy:     Cervical: No cervical adenopathy.  Skin:    General: Skin is warm and dry.  Coloration: Skin is not pale.     Findings: No erythema or rash.  Neurological:     Mental Status: She is alert.     Coordination: Coordination normal.     Deep Tendon Reflexes: Reflexes are normal and symmetric. Reflexes normal.  Psychiatric:        Mood and Affect: Mood normal.     Comments: Less anxious  Talkative  Cheerful            Assessment & Plan:   Problem List Items Addressed This Visit       Cardiovascular and Mediastinum   Essential hypertension - Primary  (Chronic)    Doing fairly well recently  Has not needed lasix for swelling since late June  bp in fair control at this time  BP Readings from Last 1 Encounters:  04/14/22 138/74   No changes needed Most recent labs reviewed  Disc lifstyle change with low sodium diet and exercise  Will keep lasix on hand if needed Continue  Amlodipine 10 mg qhs Hydralazine 10 mg bid Pulse is stable No hypotension       Relevant Orders   Basic metabolic panel     Genitourinary   Renal insufficiency    Now holding lasix/has not needed it  Making effort to drink more water bp is controlled  bmet ordered  Last gfr was 40.1        Relevant Orders   Basic metabolic panel     Other   Depression with anxiety    Doing well with sertraline 150 mg daily  Mood was good today  Less anxious       Edema    No problems lately  No lasix since late June and has not needed it  bmet ordered Nl exam      Weakness    Ok if does not stand for prolonged periods Doing better      Weight loss    Wt has been stable

## 2022-04-14 NOTE — Assessment & Plan Note (Signed)
Now holding lasix/has not needed it  Making effort to drink more water bp is controlled  bmet ordered  Last gfr was 40.1

## 2022-04-14 NOTE — Assessment & Plan Note (Signed)
No problems lately  No lasix since late June and has not needed it  bmet ordered Nl exam

## 2022-04-14 NOTE — Assessment & Plan Note (Signed)
Wt has been stable

## 2022-04-14 NOTE — Assessment & Plan Note (Signed)
Ok if does not stand for prolonged periods Doing better

## 2022-04-14 NOTE — Patient Instructions (Signed)
Continue your physical therapy  If something bothers your back, stop and let your therapist know  Stay active (mind and body)   Take lasix only if you need it  It is ok to take pepcid only at night   Lab today

## 2022-04-14 NOTE — Assessment & Plan Note (Signed)
Doing fairly well recently  Has not needed lasix for swelling since late June  bp in fair control at this time  BP Readings from Last 1 Encounters:  04/14/22 138/74   No changes needed Most recent labs reviewed  Disc lifstyle change with low sodium diet and exercise  Will keep lasix on hand if needed Continue  Amlodipine 10 mg qhs Hydralazine 10 mg bid Pulse is stable No hypotension

## 2022-04-21 ENCOUNTER — Other Ambulatory Visit: Payer: Self-pay | Admitting: Family Medicine

## 2022-05-06 ENCOUNTER — Other Ambulatory Visit: Payer: Self-pay | Admitting: Family Medicine

## 2022-05-28 ENCOUNTER — Other Ambulatory Visit: Payer: Self-pay | Admitting: Family Medicine

## 2022-05-28 DIAGNOSIS — Z85828 Personal history of other malignant neoplasm of skin: Secondary | ICD-10-CM | POA: Diagnosis not present

## 2022-05-28 DIAGNOSIS — Z7982 Long term (current) use of aspirin: Secondary | ICD-10-CM | POA: Diagnosis not present

## 2022-05-28 DIAGNOSIS — F419 Anxiety disorder, unspecified: Secondary | ICD-10-CM | POA: Diagnosis not present

## 2022-05-28 DIAGNOSIS — R32 Unspecified urinary incontinence: Secondary | ICD-10-CM | POA: Diagnosis not present

## 2022-05-28 DIAGNOSIS — K219 Gastro-esophageal reflux disease without esophagitis: Secondary | ICD-10-CM | POA: Diagnosis not present

## 2022-05-28 DIAGNOSIS — R69 Illness, unspecified: Secondary | ICD-10-CM | POA: Diagnosis not present

## 2022-05-28 DIAGNOSIS — E785 Hyperlipidemia, unspecified: Secondary | ICD-10-CM | POA: Diagnosis not present

## 2022-05-28 DIAGNOSIS — I10 Essential (primary) hypertension: Secondary | ICD-10-CM | POA: Diagnosis not present

## 2022-05-28 DIAGNOSIS — G8929 Other chronic pain: Secondary | ICD-10-CM | POA: Diagnosis not present

## 2022-05-28 DIAGNOSIS — Z882 Allergy status to sulfonamides status: Secondary | ICD-10-CM | POA: Diagnosis not present

## 2022-06-11 ENCOUNTER — Telehealth: Payer: Self-pay | Admitting: Family Medicine

## 2022-06-11 ENCOUNTER — Other Ambulatory Visit: Payer: Self-pay

## 2022-06-11 ENCOUNTER — Encounter (HOSPITAL_BASED_OUTPATIENT_CLINIC_OR_DEPARTMENT_OTHER): Payer: Self-pay

## 2022-06-11 ENCOUNTER — Emergency Department (HOSPITAL_BASED_OUTPATIENT_CLINIC_OR_DEPARTMENT_OTHER)
Admission: EM | Admit: 2022-06-11 | Discharge: 2022-06-11 | Disposition: A | Discharge status: Home / Self Care | Payer: Medicare HMO | Attending: Emergency Medicine | Admitting: Emergency Medicine

## 2022-06-11 ENCOUNTER — Ambulatory Visit: Payer: Self-pay

## 2022-06-11 DIAGNOSIS — R531 Weakness: Secondary | ICD-10-CM | POA: Insufficient documentation

## 2022-06-11 DIAGNOSIS — I10 Essential (primary) hypertension: Secondary | ICD-10-CM | POA: Insufficient documentation

## 2022-06-11 DIAGNOSIS — Z85828 Personal history of other malignant neoplasm of skin: Secondary | ICD-10-CM | POA: Insufficient documentation

## 2022-06-11 DIAGNOSIS — Z7982 Long term (current) use of aspirin: Secondary | ICD-10-CM | POA: Insufficient documentation

## 2022-06-11 DIAGNOSIS — Z79899 Other long term (current) drug therapy: Secondary | ICD-10-CM | POA: Diagnosis not present

## 2022-06-11 DIAGNOSIS — E860 Dehydration: Secondary | ICD-10-CM

## 2022-06-11 LAB — URINALYSIS, ROUTINE W REFLEX MICROSCOPIC
Bilirubin Urine: NEGATIVE
Glucose, UA: NEGATIVE mg/dL
Hgb urine dipstick: NEGATIVE
Ketones, ur: NEGATIVE mg/dL
Leukocytes,Ua: NEGATIVE
Nitrite: NEGATIVE
Specific Gravity, Urine: 1.017 (ref 1.005–1.030)
pH: 5.5 (ref 5.0–8.0)

## 2022-06-11 LAB — CBC
HCT: 44.3 % (ref 36.0–46.0)
Hemoglobin: 14.5 g/dL (ref 12.0–15.0)
MCH: 28.7 pg (ref 26.0–34.0)
MCHC: 32.7 g/dL (ref 30.0–36.0)
MCV: 87.7 fL (ref 80.0–100.0)
Platelets: 331 10*3/uL (ref 150–400)
RBC: 5.05 MIL/uL (ref 3.87–5.11)
RDW: 13.3 % (ref 11.5–15.5)
WBC: 10.7 10*3/uL — ABNORMAL HIGH (ref 4.0–10.5)
nRBC: 0 % (ref 0.0–0.2)

## 2022-06-11 LAB — BASIC METABOLIC PANEL
Anion gap: 8 (ref 5–15)
BUN: 28 mg/dL — ABNORMAL HIGH (ref 8–23)
CO2: 30 mmol/L (ref 22–32)
Calcium: 10.1 mg/dL (ref 8.9–10.3)
Chloride: 101 mmol/L (ref 98–111)
Creatinine, Ser: 1.25 mg/dL — ABNORMAL HIGH (ref 0.44–1.00)
GFR, Estimated: 41 mL/min — ABNORMAL LOW (ref >60)
Glucose, Bld: 102 mg/dL — ABNORMAL HIGH (ref 70–99)
Potassium: 5.4 mmol/L — ABNORMAL HIGH (ref 3.5–5.1)
Sodium: 139 mmol/L (ref 135–145)

## 2022-06-11 MED ORDER — SODIUM CHLORIDE 0.9 % IV BOLUS
500.0000 mL | Freq: Once | INTRAVENOUS | Status: AC
  Administered 2022-06-11: 500 mL via INTRAVENOUS

## 2022-06-11 NOTE — ED Provider Notes (Signed)
[facility] Provider Note   CSN: [id number] Arrival date & time: 06/11/22  1458     History  Chief Complaint  Patient presents with   Weakness    Linda Robinson is a 88 y.o. female.   Weakness Patient presents with generalized weakness.  Feels weak after he stands for a while.  Often around 10 to 15 minutes.  Does not happen every time however.  Has had this going for years.  Is had previous work-ups.  No clear cause of been found.  No chest pain.  No trouble breathing.  Another episode today.  Called the doctor and was planning to go to the office.  Could not get her in.  [facility] urgent care urgent care said she needed to go to the ER.  Feeling better now.  No fevers or chills.  Good oral intake.    Past Medical History:  Diagnosis Date   Ankylosis of lumbar spine    Basal cell carcinoma    removed   Cervical spine pain    Chest pain    a. 02/2017 MV: EF 54%, small, distal anterior wall/apical infacrt (likely misregistration), no ischemia-->Low risk; b. 02/2017 Echo: EF 55-60%, mild MR, PASP 101mHg.   Chronic back pain    stenosis   Constipation    takes Colace daily   Depression    takes Zoloft daily   Diverticulosis of colon    GERD (gastroesophageal reflux disease)    atypical chest pain   Gout    hx of;was on Allopurinol but taken off 6 wks ago by medical MD   History of diverticulitis of colon    HLD (hyperlipidemia)    takes Pravastatin daily   HTN (hypertension)    takes Amlodipine and carvedilol   Lumbar spondylosis with myelopathy    Numbness    OA (osteoarthritis)    right knee   Osteopenia    takes Calcium and Vit D daily   Scoliosis    Vertigo    takes Antivert daily as needed    Home Medications Prior to Admission medications   Medication Sig Start Date End Date Taking? Authorizing Provider  amLODipine (NORVASC) 10 MG tablet TAKE 1 TABLET DAILY 05/28/22   Tower, MWynelle Fanny MD  aspirin EC 81 MG tablet Take 1 tablet  (81 mg total) by mouth daily. 03/01/17   AWellington Hampshire MD  Calcium Carbonate-Vitamin D 500-125 MG-UNIT TABS Take by mouth.    [provider]  Cranberry 250 MG CAPS Take 1 capsule by mouth daily.    [provider]  docusate sodium (COLACE) 100 MG capsule Take 1-2 capsules (100-200 mg total) by mouth 2 (two) times daily. 100 mg in the morning and 200 mg at night 12/28/19   Tower, [place], MD  famotidine (PEPCID) 20 MG tablet TAKE 1 TABLET TWICE A DAY 05/06/22   Tower, MWynelle Fanny MD  fish oil-omega-3 fatty acids 1000 MG capsule Take 1 g by mouth at bedtime.    [provider]  furosemide (LASIX) 20 MG tablet TAKE 1 TABLET DAILY AS     NEEDED FOR SWELLING Patient not taking: Reported on 04/14/2022 01/21/22   Tower, MWynelle Fanny MD  hydrALAZINE (APRESOLINE) 10 MG tablet TAKE 1 TABLET IN THE       MORNING AND AT BEDTIME 04/21/22   Tower, MWynelle Fanny MD  meclizine (ANTIVERT) 25 MG tablet Take 25 mg by mouth 4 (four) times daily as needed for dizziness.  [provider]  pravastatin (PRAVACHOL) 20 MG tablet TAKE 1 TABLET DAILY 05/28/22   Tower, Wynelle Fanny, MD  sertraline (ZOLOFT) 100 MG tablet Take 1.5 tablets (150 mg total) by mouth daily. 02/03/22   Tower, Wynelle Fanny, MD  Tetrahydrozoline HCl (EYE DROPS OP) Apply 1-2 drops to eye as needed (for dry eye).    [provider]      Allergies    Ace inhibitors, Bactrim [sulfamethoxazole-trimethoprim], Cephalosporins, Colesevelam, Hctz [hydrochlorothiazide], Losartan, Lovastatin, Lyrica [pregabalin], Raloxifene, Rosuvastatin, and Simvastatin    Review of Systems   Review of Systems  Neurological:  Positive for weakness.    Physical Exam Updated Vital Signs BP (!) 184/150   Pulse 86   Temp 98.5 F (36.9 C)   Resp 17   Ht 5' (1.524 m)   Wt 60.8 kg   SpO2 97%   BMI 26.18 kg/m  Physical Exam Vitals and nursing note reviewed.  Cardiovascular:     Rate and Rhythm: Regular rhythm.  Pulmonary:     Breath sounds: No  wheezing or rhonchi.  Abdominal:     Tenderness: There is no abdominal tenderness.  Musculoskeletal:     Cervical back: Neck supple.  Skin:    Capillary Refill: Capillary refill takes less than 2 seconds.  Neurological:     Mental Status: She is alert and oriented to person, place, and time.     ED Results / Procedures / Treatments   Labs (all labs ordered are listed, but only abnormal results are displayed) Labs Reviewed  BASIC METABOLIC PANEL - Abnormal; Notable for the following components:      Result Value   Potassium 5.4 (*)    Glucose, Bld 102 (*)    BUN 28 (*)    Creatinine, Ser 1.25 (*)    GFR, Estimated 41 (*)    All other components within normal limits  CBC - Abnormal; Notable for the following components:   WBC 10.7 (*)    All other components within normal limits  URINALYSIS, ROUTINE W REFLEX MICROSCOPIC - Abnormal; Notable for the following components:   Protein, ur TRACE (*)    All other components within normal limits    EKG None  Radiology No results found.  Procedures Procedures    Medications Ordered in ED Medications  sodium chloride 0.9 % bolus 500 mL (500 mLs Intravenous New Bag/Given 06/11/22 1635)    ED Course/ Medical Decision Making/ A&P                           Medical Decision Making Amount and/or Complexity of Data Reviewed Labs: ordered.   Patient with episodic generalized weakness.  Has been going for years.  Today is not really different than the other prior episodes.  Reviewed PCPs notes.  Has had some rather extensive work-up.  No fevers.  Has had a leukocytosis in the past and is seen hematology for it.  TSH has been normal.  We will check basic blood work and orthostatic vital signs.  Creatinine is mildly increased from before.  We will give fluid bolus.  Urine does not show infection.  Feeling better after fluid.  Not orthostatic.  Will discharge home with outpatient follow-up.  Appears to be somewhat chronic issues with  orthostasis after standing for around 10 minutes.        Final Clinical Impression(s) / ED Diagnoses Final diagnoses:  Weakness  Dehydration    Rx / DC Orders  ED Discharge Orders     None         Davonna Belling, MD 06/11/22 1734

## 2022-06-11 NOTE — Telephone Encounter (Signed)
Patient son Linda Robinson called back in and stated his mom needed an appointment with Linda Robinson. He stated her BP was 179/72 and she was experiencing some lightheadedness. Informed patient son I will have to get her over to access, but he stated she already spoke to them and she refused to call 9-1-1 and don't want to go to the ER. She only wants to see Linda Robinson.

## 2022-06-11 NOTE — ED Triage Notes (Signed)
Patient here POV from Home.  Endorses Weakness Recurrent for a Few Years. States it is not Constant but occurs when the Patient performs ADLs.  No Chest Pain. No SOB. No N/V/D. No Fevers.   NAD Noted during Triage. A&Ox4. GCS 15. Ambulatory with [facility].

## 2022-06-11 NOTE — Telephone Encounter (Signed)
Pt called [facility] and said UC had called pt and advised her to go to ED. Pt does not want to have to wait at ED. I spoke with pt; pt said that her symptoms have been going on for awhile but pt is feeling lightheaded and BP was up this morning and when pt moves around she does get lightheaded. I spoke with pt and said that several medical personal has advised pt that she needs to be seen today and pt said that she will go to [facility] to be seen instead of ED. I advised pt that would be fine. Pt asked me to call Richardson Landry and he will take her. I spoke with Richardson Landry who lives next door to pt and he will take pt now to [facility]. Sending note to Dr Glori Bickers who is out of office and [place] CMA.

## 2022-06-11 NOTE — Telephone Encounter (Signed)
I spoke with pt son Linda Robinson signed) and he said pt has not missed taking BP meds, when pt moves around has lightheadedness and BP is elevated today.BP 172/79. No irreg heart beat, NO CP, SOB or H/A. Pt is not having swelling. Linda Robinson said pt only wants to see Dr Glori Bickers in office. Dr Glori Bickers is out of office this afternoon and does not have available appts on 06/12/22 in AM and then is out of office. Linda Robinson said pt will not go to ED. I scheduled pt an appt this afternoon at 3:30 at [facility] with UC and ED precautions and Linda Robinson voiced understanding and said he would get pt ready and go to UC this afternoon.sending note to Dr Glori Bickers and Chunky CMA.   [place] Day - Client TELEPHONE ADVICE RECORD AccessNurse Patient Name: Linda Robinson Gender: Female DOB: 07-07-1929 Age: 88 Y 3 M 23 D Return Phone Number: [phone] (Primary) Address: [place]  [address] Client [facility] [place] Day - Client Client Site [facility], [place] - MD Contact Type Call Who Is Calling Patient / Member / Family / Caregiver Call Type Triage / Clinical Relationship To Patient Self Return Phone Number [phone] (Primary) Chief Complaint Weakness, Generalized Reason for Call Symptomatic / Request for [place] has a patient on the phone who experiencing weakness the few days, her blood pressure has been running in the 160/90, and she said when she's laying down she gets up and she is on blood pressure medication and she is taking it. Translation No Nurse Assessment Nurse: [name], RN, Ria Comment Date/Time (Eastern Time): 06/11/2022 12:49:22 PM Confirm and document reason for call. If symptomatic, describe symptoms. ---Callers states her current BP is 160/81. States when she stands up to try to do anything she starts feeling weak and has to sit down. No  fever. Does the patient have any new or worsening symptoms? ---Yes Will a triage be completed? ---Yes Related visit to physician within the last 2 weeks? ---No Does the PT have any chronic conditions? (i.e. diabetes, asthma, this includes High risk factors for pregnancy, etc.) ---Yes List chronic conditions. ---high cholesterol, HTN, and stomach issues- acid reflux Is this a behavioral health or substance abuse call? ---No Guidelines Guideline Title Affirmed Question Affirmed Notes Nurse Date/Time (Eastern Time) Weakness (Generalized) and Fatigue [1] SEVERE weakness (i.e., unable to walk or barely able to walk, requires support) AND [2] new-onset or worsening Weiss-Hilton, RN, Ria Comment 06/11/2022 12:51:27 PM PLEASE NOTE: All timestamps contained within this report are represented as [place] Standard Time. CONFIDENTIALTY NOTICE: This fax transmission is intended only for the addressee. It contains information that is legally privileged, confidential or otherwise protected from use or disclosure. If you are not the intended recipient, you are strictly prohibited from reviewing, disclosing, copying using or disseminating any of this information or taking any action in reliance on or regarding this information. If you have received this fax in error, please notify us immediately by telephone so that we can arrange for its return to [place]. Phone: [phone], Toll-Free: [phone], Fax: [phone] Page: 2 of 2 Call Id: [id number] [place]. Time Eilene Ghazi Time) Disposition Final User 06/11/2022 12:54:58 PM Call EMS 911 Now Yes Weiss-Hilton, RN, Ria Comment 06/11/2022 12:57:15 PM 911 Outcome Documentation Weiss-Hilton, RN, Ria Comment Reason: Caller stated she doesn't want to call 911 for financial reasons and would rather get an appt at the office. 06/11/2022  1:08:21 PM Send To RN Personal Weiss-Hilton, RN, Ria Comment Final Disposition 06/11/2022 12:54:58 PM Call EMS 911 Now Yes Weiss-Hilton, RN,  Ivonne Andrew Disagree/Comply Disagree Caller Understands Yes PreDisposition InappropriateToAsk Care Advice Given Per Guideline CALL EMS 911 NOW: * Immediate medical attention is needed. You need to hang up and call 911 (or an ambulance). * Triager Discretion: I'll call you back in a few minutes to be sure you were able to reach them. CARE ADVICE given per Weakness and Fatigue (Adult) guideline. Comments User: Carolan Clines, RN Date/Time Eilene Ghazi Time): 06/11/2022 1:00:13 PM RN attempted to reach the back line several times consecutively and each time it rang for 40 seconds and disconnected with a fast busy signal. User: Carolan Clines, RN Date/Time Eilene Ghazi Time): 06/11/2022 1:05:38 PM RN attempted reach someone via office number after being unable to reach someone via back line and disconnected due to long hold time. User: Carolan Clines, RN Date/Time Eilene Ghazi Time): 06/11/2022 1:08:09 PM RN attempted again to reach the back line again and it rang for 40 seconds and disconnected with fast busy signal User: Carolan Clines, RN Date/Time Eilene Ghazi Time): 06/11/2022 1:20:30 PM RN spoke with the backline and was told the caller's son called on her behalf and caller is speaking to an office nurse now

## 2022-06-11 NOTE — ED Notes (Signed)
Patient verbalizes understanding of discharge instructions. Opportunity for questioning and answers were provided. Patient discharged from ED.  °

## 2022-06-11 NOTE — Telephone Encounter (Signed)
Feeling weak, bp elevated last few days, am bp's in 160s/90s  Transferred call to access nurse

## 2022-06-11 NOTE — Telephone Encounter (Signed)
Aware, will watch for correspondence  

## 2022-06-12 ENCOUNTER — Ambulatory Visit: Payer: Medicare HMO | Admitting: Family Medicine

## 2022-06-16 ENCOUNTER — Telehealth: Payer: Self-pay

## 2022-06-16 NOTE — Progress Notes (Signed)
    Chronic Care Management Pharmacy Assistant   Name: Linda Robinson  MRN: [id number] DOB: [birth year]  Reason for Encounter: Non-CCM [facility] Follow-Up)   Medications: Outpatient Encounter Medications as of 06/16/2022  Medication Sig   amLODipine (NORVASC) 10 MG tablet TAKE 1 TABLET DAILY   aspirin EC 81 MG tablet Take 1 tablet (81 mg total) by mouth daily.   Calcium Carbonate-Vitamin D 500-125 MG-UNIT TABS Take by mouth.   Cranberry 250 MG CAPS Take 1 capsule by mouth daily.   docusate sodium (COLACE) 100 MG capsule Take 1-2 capsules (100-200 mg total) by mouth 2 (two) times daily. 100 mg in the morning and 200 mg at night   famotidine (PEPCID) 20 MG tablet TAKE 1 TABLET TWICE A DAY   fish oil-omega-3 fatty acids 1000 MG capsule Take 1 g by mouth at bedtime.   furosemide (LASIX) 20 MG tablet TAKE 1 TABLET DAILY AS     NEEDED FOR SWELLING (Patient not taking: Reported on 04/14/2022)   hydrALAZINE (APRESOLINE) 10 MG tablet TAKE 1 TABLET IN THE       MORNING AND AT BEDTIME   meclizine (ANTIVERT) 25 MG tablet Take 25 mg by mouth 4 (four) times daily as needed for dizziness.   pravastatin (PRAVACHOL) 20 MG tablet TAKE 1 TABLET DAILY   sertraline (ZOLOFT) 100 MG tablet Take 1.5 tablets (150 mg total) by mouth daily.   Tetrahydrozoline HCl (EYE DROPS OP) Apply 1-2 drops to eye as needed (for dry eye).   No facility-administered encounter medications on file as of 06/16/2022.   Reviewed hospital notes for details of recent visit. Patient has been contacted by Transitions of Care team: No  Admitted to the ED on 06/11/2022. Discharge date was 06/11/2022.  Discharged from [facility].   Discharge diagnosis (Principal Problem): Weakness and dehydration Patient was discharged to Home  Brief summary of hospital course: Patient with episodic generalized weakness.  Has been going for years. Today is not really different than the other prior episodes.  Reviewed PCPs notes.   Has had some rather extensive work-up.  No fevers.  Has had a leukocytosis in the past and is seen hematology for it.  TSH has been normal.  We will check basic blood work and orthostatic vital signs.   Creatinine is mildly increased from before.  We will give fluid bolus.  Urine does not show infection. Feeling better after fluid.  Not orthostatic.  Will discharge home with outpatient follow-up.  Appears to be somewhat chronic issues with orthostasis after standing for around 10 minutes.  No medication changes  Medications that remain the same after Hospital Discharge:??  -All other medications will remain the same.    Next CCM appt: Non-CCM  Other upcoming appts: PCP appointment on 06/22/2022 for Hospital F/U  Charlene Brooke, PharmD notified and will determine if action is needed.  Charlene Brooke, CPP notified  Marijean Niemann, [place] Clinical Pharmacy Assistant [phone]

## 2022-06-22 ENCOUNTER — Encounter: Payer: Self-pay | Admitting: Family Medicine

## 2022-06-22 ENCOUNTER — Ambulatory Visit (INDEPENDENT_AMBULATORY_CARE_PROVIDER_SITE_OTHER): Payer: Medicare HMO | Admitting: Family Medicine

## 2022-06-22 VITALS — BP 140/60 | HR 66 | Temp 97.7°F | Ht 60.0 in | Wt 134.5 lb

## 2022-06-22 DIAGNOSIS — R531 Weakness: Secondary | ICD-10-CM

## 2022-06-22 DIAGNOSIS — N289 Disorder of kidney and ureter, unspecified: Secondary | ICD-10-CM

## 2022-06-22 DIAGNOSIS — Z23 Encounter for immunization: Secondary | ICD-10-CM

## 2022-06-22 DIAGNOSIS — I10 Essential (primary) hypertension: Secondary | ICD-10-CM

## 2022-06-22 DIAGNOSIS — F418 Other specified anxiety disorders: Secondary | ICD-10-CM | POA: Diagnosis not present

## 2022-06-22 DIAGNOSIS — R69 Illness, unspecified: Secondary | ICD-10-CM | POA: Diagnosis not present

## 2022-06-22 LAB — BASIC METABOLIC PANEL
BUN: 32 mg/dL — ABNORMAL HIGH (ref 6–23)
CO2: 30 mEq/L (ref 19–32)
Calcium: 9.8 mg/dL (ref 8.4–10.5)
Chloride: 100 mEq/L (ref 96–112)
Creatinine, Ser: 1.26 mg/dL — ABNORMAL HIGH (ref 0.40–1.20)
GFR: 37.37 mL/min — ABNORMAL LOW (ref >60.00)
Glucose, Bld: 103 mg/dL — ABNORMAL HIGH (ref 70–99)
Potassium: 5.3 mEq/L — ABNORMAL HIGH (ref 3.5–5.1)
Sodium: 140 mEq/L (ref 135–145)

## 2022-06-22 NOTE — Patient Instructions (Addendum)
Try and increase your fluid intake   Aim for 64 oz of fluid daily- mostly water  No need to add salt to the water or other fluid    Take care of yourself !   Don't stand for more than 5-10 minutes without taking a bread  Labs today to re check kidney numbers Flu shot today

## 2022-06-22 NOTE — Progress Notes (Signed)
Subjective:    Patient ID: Linda Robinson, female    DOB: [birth year], 88 y.o.   MRN: [id number]  HPI Pt presents for f/u of HTN   Wt Readings from Last 3 Encounters:  06/22/22 134 lb 8 oz (61 kg)  06/11/22 134 lb 0.6 oz (60.8 kg)  04/14/22 134 lb (60.8 kg)   26.27 kg/m  Tying to eat regularly and get some snacks now  Drinking more also   Pt presented to ER on 9/14 for b/o weakness that occ with standing over 10-15 mintus  Intermittent/ not all the time  Noted she has been w/u in the past for it  No fever or constitutional symptoms   Labs were fairly stable with exception of Cr that did go up She was tx with fluid bolus  Ua clear  Felt better after fluids No orthostasis   Results for orders placed or performed during the hospital encounter of 62/69/48  Basic metabolic panel  Result Value Ref Range   Sodium 139 135 - 145 mmol/L   Potassium 5.4 (H) 3.5 - 5.1 mmol/L   Chloride 101 98 - 111 mmol/L   CO2 30 22 - 32 mmol/L   Glucose, Bld 102 (H) 70 - 99 mg/dL   BUN 28 (H) 8 - 23 mg/dL   Creatinine, Ser 1.25 (H) 0.44 - 1.00 mg/dL   Calcium 10.1 8.9 - 10.3 mg/dL   GFR, Estimated 41 (L) >60 mL/min   Anion gap 8 5 - 15  CBC  Result Value Ref Range   WBC 10.7 (H) 4.0 - 10.5 K/uL   RBC 5.05 3.87 - 5.11 MIL/uL   Hemoglobin 14.5 12.0 - 15.0 g/dL   HCT 44.3 36.0 - 46.0 %   MCV 87.7 80.0 - 100.0 fL   MCH 28.7 26.0 - 34.0 pg   MCHC 32.7 30.0 - 36.0 g/dL   RDW 13.3 11.5 - 15.5 %   Platelets 331 150 - 400 K/uL   nRBC 0.0 0.0 - 0.2 %  Urinalysis, Routine w reflex microscopic Urine, Clean Catch  Result Value Ref Range   Color, Urine YELLOW YELLOW   APPearance CLEAR CLEAR   Specific Gravity, Urine 1.017 1.005 - 1.030   pH 5.5 5.0 - 8.0   Glucose, UA NEGATIVE NEGATIVE mg/dL   Hgb urine dipstick NEGATIVE NEGATIVE   Bilirubin Urine NEGATIVE NEGATIVE   Ketones, ur NEGATIVE NEGATIVE mg/dL   Protein, ur TRACE (A) NEGATIVE mg/dL   Nitrite NEGATIVE NEGATIVE   Leukocytes,Ua  NEGATIVE NEGATIVE     No more spells since then   Family bought her some gatorade   Eating regular  Tries not to stand over 10 minutes at a time    bp is stable today  No cp or palpitations or headaches or edema  No side effects to medicines  BP Readings from Last 3 Encounters:  06/22/22 (!) 144/68  06/11/22 (!) 161/60  04/14/22 138/74     Amlodipine 10 mg QHS Hydralazine 10 mg bid   Pulse Readings from Last 3 Encounters:  06/22/22 66  06/11/22 86  04/14/22 70    Lab Results  Component Value Date   HGBA1C 5.5 02/03/2022   Church brings her meals so she does not have to cook much   Patient Active Problem List   Diagnosis Date Noted   Falls frequently 03/10/2022   Osteoarthritis of both knees 10/29/2021   Lower abdominal pain 07/23/2021   Diverticulosis 07/23/2021   Cervical radicular pain (left)  09/12/2020   Cervical disc disorder with radiculopathy of cervical region 09/12/2020   Cervical spondylosis 09/12/2020   Cervical fusion syndrome (C4-T1 ACDF) 09/12/2020   Neuropathic pain of hand, left 09/12/2020   Chronic pain syndrome 09/12/2020   Failed neck syndrome 07/30/2020   Left hand pain 07/30/2020   Weakness 05/01/2020   Grief reaction 11/17/2019   Cervical stenosis of spinal canal 11/17/2019   Hearing loss 11/17/2019   Poor balance 06/12/2019   Screening mammogram, encounter for 05/23/2018   Substernal chest pain 08/16/2017   Atherosclerosis of aorta ([place]) 03/09/2017   Weight loss 02/23/2017   Routine general medical examination at a health care facility 07/05/2015   Estrogen deficiency 07/05/2015   Prediabetes 01/01/2015   Lumbar spinal stenosis 11/13/2014   History of vertigo 10/05/2014   Leukocytosis 07/16/2014   Risk for falls 06/27/2014   Low back pain on right side with sciatica 07/24/2013   Edema 04/26/2013   Encounter for Medicare annual wellness exam 04/11/2013   Lymphocytosis 01/05/2012   GERD (gastroesophageal reflux disease)     Osteopenia 01/30/2009   Constipation 11/01/2007   Depression with anxiety 02/14/2007   Hyperlipidemia LDL goal <130 02/04/2007   Gout 02/04/2007   Essential hypertension 02/04/2007   DIVERTICULOSIS, COLON 02/04/2007   Renal insufficiency 02/04/2007   OSTEOARTHRITIS 02/04/2007   DEGENERATIVE [place] DISEASE 02/04/2007   Past Medical History:  Diagnosis Date   Ankylosis of lumbar spine    Basal cell carcinoma    removed   Cervical spine pain    Chest pain    a. 02/2017 MV: EF 54%, small, distal anterior wall/apical infacrt (likely misregistration), no ischemia-->Low risk; b. 02/2017 Echo: EF 55-60%, mild MR, PASP 77mHg.   Chronic back pain    stenosis   Constipation    takes Colace daily   Depression    takes Zoloft daily   Diverticulosis of colon    GERD (gastroesophageal reflux disease)    atypical chest pain   Gout    hx of;was on Allopurinol but taken off 6 wks ago by medical MD   History of diverticulitis of colon    HLD (hyperlipidemia)    takes Pravastatin daily   HTN (hypertension)    takes Amlodipine and carvedilol   Lumbar spondylosis with myelopathy    Numbness    OA (osteoarthritis)    right knee   Osteopenia    takes Calcium and Vit D daily   Scoliosis    Vertigo    takes Antivert daily as needed   Past Surgical History:  Procedure Laterality Date   APPENDECTOMY  at age 88  [place] 04/2006   CHOLECYSTECTOMY  1970   COLONOSCOPY     KNEE SURGERY Right    Medial meniscus tear   LUMBAR LAMINECTOMY/DECOMPRESSION MICRODISCECTOMY Bilateral 11/13/2014   Procedure: Lumbar one-two Bilateral laminectomy and foraminotomy, Left Lumbar one-two microdiscectomy ;  Surgeon: RFaythe Ghee MD;  Location: [facility];  Service: Neurosurgery;  Laterality: Bilateral;   NECK SURGERY  [phone]  x 3; fusions   PARTIAL COLECTOMY  09/1999   d/t diverticulitis   TUBAL LIGATION     Social History   Tobacco Use   Smoking status: Never    Smokeless tobacco: Never  Vaping Use   Vaping Use: Never used  Substance Use Topics   Alcohol use: Never    Alcohol/week: 0.0 standard drinks of alcohol   Drug use: Never  Family History  Problem Relation Age of Onset   Cancer Father        throat and bladder   Diabetes Father    Hypertension Father    Heart attack Father    Stroke Mother    Alzheimer's disease Mother    Hypertension Mother    Allergies  Allergen Reactions   Ace Inhibitors     REACTION: increased K+, increased creat.   Bactrim [Sulfamethoxazole-Trimethoprim]     rash   Cephalosporins     REACTION: reaction not known   Colesevelam     REACTION: constipation   Hctz [Hydrochlorothiazide]     Makes her feel "bad"   Losartan     Increased K   Lovastatin     REACTION: doesn't work   Lyrica [Pregabalin]     Felt bad    Raloxifene     REACTION: cramps   Rosuvastatin     REACTION: myalgia   Simvastatin     REACTION: myalgia   Current Outpatient Medications on File Prior to Visit  Medication Sig Dispense Refill   amLODipine (NORVASC) 10 MG tablet TAKE 1 TABLET DAILY 90 tablet 1   aspirin EC 81 MG tablet Take 1 tablet (81 mg total) by mouth daily. 90 tablet 3   Calcium Carbonate-Vitamin D 500-125 MG-UNIT TABS Take by mouth.     Cranberry 250 MG CAPS Take 1 capsule by mouth daily.     docusate sodium (COLACE) 100 MG capsule Take 1-2 capsules (100-200 mg total) by mouth 2 (two) times daily. 100 mg in the morning and 200 mg at night 180 capsule 1   famotidine (PEPCID) 20 MG tablet TAKE 1 TABLET TWICE A DAY 180 tablet 3   fish oil-omega-3 fatty acids 1000 MG capsule Take 1 g by mouth at bedtime.     hydrALAZINE (APRESOLINE) 10 MG tablet TAKE 1 TABLET IN THE       MORNING AND AT BEDTIME 180 tablet 2   meclizine (ANTIVERT) 25 MG tablet Take 25 mg by mouth 4 (four) times daily as needed for dizziness.     pravastatin (PRAVACHOL) 20 MG tablet TAKE 1 TABLET DAILY 90 tablet 1   sertraline (ZOLOFT) 100 MG tablet  Take 1.5 tablets (150 mg total) by mouth daily. 135 tablet 3   Tetrahydrozoline HCl (EYE DROPS OP) Apply 1-2 drops to eye as needed (for dry eye).     No current facility-administered medications on file prior to visit.     Review of Systems  Constitutional:  Positive for fatigue. Negative for activity change, appetite change, fever and unexpected weight change.  HENT:  Negative for congestion, ear pain, rhinorrhea, sinus pressure and sore throat.   Eyes:  Negative for pain, redness and visual disturbance.  Respiratory:  Negative for cough, shortness of breath and wheezing.   Cardiovascular:  Negative for chest pain and palpitations.  Gastrointestinal:  Negative for abdominal pain, blood in stool, constipation and diarrhea.  Endocrine: Negative for polydipsia and polyuria.  Genitourinary:  Negative for dysuria, frequency and urgency.  Musculoskeletal:  Negative for arthralgias, back pain and myalgias.  Skin:  Negative for pallor and rash.  Allergic/Immunologic: Negative for environmental allergies.  Neurological:  Positive for weakness. Negative for dizziness, syncope, light-headedness and headaches.  Hematological:  Negative for adenopathy. Does not bruise/bleed easily.  Psychiatric/Behavioral:  Negative for decreased concentration and dysphoric mood. The patient is not nervous/anxious.        Objective:   Physical Exam Constitutional:  General: She is not in acute distress.    Appearance: Normal appearance. She is well-developed and normal weight. She is not ill-appearing or diaphoretic.  HENT:     Head: Normocephalic and atraumatic.     Mouth/Throat:     Mouth: Mucous membranes are moist.  Eyes:     General: No scleral icterus.    Conjunctiva/sclera: Conjunctivae normal.     Pupils: Pupils are equal, round, and reactive to light.  Neck:     Thyroid: No thyromegaly.     Vascular: No carotid bruit or JVD.  Cardiovascular:     Rate and Rhythm: Normal rate and regular  rhythm.     Heart sounds: Normal heart sounds.     No gallop.  Pulmonary:     Effort: Pulmonary effort is normal. No respiratory distress.     Breath sounds: Normal breath sounds. No wheezing or rales.  Abdominal:     General: There is no distension or abdominal bruit.     Palpations: Abdomen is soft.  Musculoskeletal:     Cervical back: Normal range of motion and neck supple.     Right lower leg: No edema.     Left lower leg: No edema.  Lymphadenopathy:     Cervical: No cervical adenopathy.  Skin:    General: Skin is warm and dry.     Coloration: Skin is not pale.     Findings: No rash.  Neurological:     Mental Status: She is alert.     Coordination: Coordination normal.     Deep Tendon Reflexes: Reflexes are normal and symmetric. Reflexes normal.  Psychiatric:        Mood and Affect: Mood normal.     Comments: Mildly anxious  Pleasant            Assessment & Plan:   Problem List Items Addressed This Visit       Cardiovascular and Mediastinum   Essential hypertension (Chronic)    Fairly stable bp No orthostasis to explain weak episodes bp in fair control at this time  BP Readings from Last 1 Encounters:  06/22/22 (!) 140/60   No changes needed Most recent labs reviewed  Disc lifstyle change with low sodium diet and exercise  Plan to continue  Amlodipine 10 mg qhs Hydralazine 10 mg bid       Relevant Orders   Basic metabolic panel     Genitourinary   Renal insufficiency    bmet today  She was dehydrated for last ER visit   Reviewed hospital records, lab results and studies in detail   Reassuring overall  Disc plan to inc her fluid intake       Relevant Orders   Basic metabolic panel     Other   Depression with anxiety    Pt thinks anxiety is stable Does get more anx with weak episodes  She does want to continue the zoloft 150 mg daily and tolerates it well  Disc coping techniques Needs to give herself permission to take breaks and not to  push      Weakness - Primary    Episodic w/o change  Seen in ER Reviewed hospital records, lab results and studies in detail  Reassuring w/u  This occurs with standing over 10 minutes  Not currently orthostatic  Will try to hydrate more for prevention Doing well today      Relevant Orders   Basic metabolic panel   Other Visit Diagnoses  Need for influenza vaccination       Relevant Orders   Flu Vaccine QUAD High Dose(Fluad) (Completed)

## 2022-06-22 NOTE — Assessment & Plan Note (Signed)
Fairly stable bp No orthostasis to explain weak episodes bp in fair control at this time  BP Readings from Last 1 Encounters:  06/22/22 (!) 140/60   No changes needed Most recent labs reviewed  Disc lifstyle change with low sodium diet and exercise  Plan to continue  Amlodipine 10 mg qhs Hydralazine 10 mg bid

## 2022-06-22 NOTE — Assessment & Plan Note (Signed)
Episodic w/o change  Seen in ER Reviewed hospital records, lab results and studies in detail  Reassuring w/u  This occurs with standing over 10 minutes  Not currently orthostatic  Will try to hydrate more for prevention Doing well today

## 2022-06-22 NOTE — Assessment & Plan Note (Signed)
Pt thinks anxiety is stable Does get more anx with weak episodes  She does want to continue the zoloft 150 mg daily and tolerates it well  Disc coping techniques Needs to give herself permission to take breaks and not to push

## 2022-06-22 NOTE — Assessment & Plan Note (Signed)
bmet today  She was dehydrated for last ER visit   Reviewed hospital records, lab results and studies in detail   Reassuring overall  Disc plan to inc her fluid intake

## 2022-07-01 ENCOUNTER — Telehealth: Payer: Self-pay | Admitting: Family Medicine

## 2022-07-01 DIAGNOSIS — N289 Disorder of kidney and ureter, unspecified: Secondary | ICD-10-CM

## 2022-07-01 DIAGNOSIS — I10 Essential (primary) hypertension: Secondary | ICD-10-CM

## 2022-07-01 NOTE — Telephone Encounter (Signed)
-----   Message from Ellamae Sia sent at 06/24/2022  3:14 PM EDT ----- Regarding: Lab orders for Thursday, 10.5.23 Lab order, thanks

## 2022-07-02 ENCOUNTER — Encounter: Payer: Self-pay | Admitting: Family Medicine

## 2022-07-02 ENCOUNTER — Other Ambulatory Visit (INDEPENDENT_AMBULATORY_CARE_PROVIDER_SITE_OTHER): Payer: Medicare HMO

## 2022-07-02 DIAGNOSIS — I10 Essential (primary) hypertension: Secondary | ICD-10-CM

## 2022-07-02 DIAGNOSIS — N289 Disorder of kidney and ureter, unspecified: Secondary | ICD-10-CM | POA: Diagnosis not present

## 2022-07-02 LAB — BASIC METABOLIC PANEL
BUN: 24 mg/dL — ABNORMAL HIGH (ref 6–23)
CO2: 32 mEq/L (ref 19–32)
Calcium: 9.9 mg/dL (ref 8.4–10.5)
Chloride: 98 mEq/L (ref 96–112)
Creatinine, Ser: 1.29 mg/dL — ABNORMAL HIGH (ref 0.40–1.20)
GFR: 36.32 mL/min — ABNORMAL LOW (ref >60.00)
Glucose, Bld: 151 mg/dL — ABNORMAL HIGH (ref 70–99)
Potassium: 5.5 mEq/L — ABNORMAL HIGH (ref 3.5–5.1)
Sodium: 139 mEq/L (ref 135–145)

## 2022-07-03 ENCOUNTER — Ambulatory Visit (INDEPENDENT_AMBULATORY_CARE_PROVIDER_SITE_OTHER): Payer: Medicare HMO | Admitting: Family Medicine

## 2022-07-03 ENCOUNTER — Encounter: Payer: Self-pay | Admitting: Family Medicine

## 2022-07-03 ENCOUNTER — Telehealth: Payer: Self-pay | Admitting: Family Medicine

## 2022-07-03 VITALS — BP 140/65 | HR 71 | Temp 97.9°F | Ht 60.0 in | Wt 135.6 lb

## 2022-07-03 DIAGNOSIS — N1832 Chronic kidney disease, stage 3b: Secondary | ICD-10-CM | POA: Diagnosis not present

## 2022-07-03 DIAGNOSIS — E875 Hyperkalemia: Secondary | ICD-10-CM | POA: Insufficient documentation

## 2022-07-03 DIAGNOSIS — N3000 Acute cystitis without hematuria: Secondary | ICD-10-CM | POA: Diagnosis not present

## 2022-07-03 DIAGNOSIS — I10 Essential (primary) hypertension: Secondary | ICD-10-CM

## 2022-07-03 DIAGNOSIS — R300 Dysuria: Secondary | ICD-10-CM

## 2022-07-03 DIAGNOSIS — R3989 Other symptoms and signs involving the genitourinary system: Secondary | ICD-10-CM | POA: Insufficient documentation

## 2022-07-03 LAB — POC URINALSYSI DIPSTICK (AUTOMATED)
Bilirubin, UA: NEGATIVE
Blood, UA: NEGATIVE
Glucose, UA: NEGATIVE
Ketones, UA: NEGATIVE
Nitrite, UA: NEGATIVE
Protein, UA: POSITIVE — AB
Spec Grav, UA: 1.025 (ref 1.010–1.025)
Urobilinogen, UA: 0.2 E.U./dL
pH, UA: 6 (ref 5.0–8.0)

## 2022-07-03 MED ORDER — CIPROFLOXACIN HCL 250 MG PO TABS: 250.0000 mg | ORAL_TABLET | Freq: Every day | ORAL | 0 refills | Status: AC

## 2022-07-03 NOTE — Telephone Encounter (Signed)
Patient called in and stated that she had been messaging with Dr. Glori Bickers on [facility] in regards to her kidneys and bladder infection. She stated that last night and this morning she was experiencing lower stomach pain and when she went to the bathroom the pain went away for a little bit, and started back up some time after. She states that she believe she do have a bladder infection.

## 2022-07-03 NOTE — Assessment & Plan Note (Signed)
With frequency but not dysuria  Drinking more water ua with sm leuk Will tx with 3 d of renal dose cipro pend cx

## 2022-07-03 NOTE — Progress Notes (Signed)
Subjective:    Patient ID: Linda Robinson, female    DOB: 27-Nov-1928, 88 y.o.   MRN: [id number]  HPI Pt presents for f/u of renal labs and urinary changes  Wt Readings from Last 3 Encounters:  07/03/22 135 lb 9.6 oz (61.5 kg)  06/22/22 134 lb 8 oz (61 kg)  06/11/22 134 lb 0.6 oz (60.8 kg)   26.48 kg/m   Lab Results  Component Value Date   CREATININE 1.29 (H) 07/02/2022   BUN 24 (H) 07/02/2022   NA 139 07/02/2022   K 5.5 No hemolysis seen (H) 07/02/2022   CL 98 07/02/2022   CO2 32 07/02/2022   GFR of 36.3 This is down from 41 Was 69 in January   Now she has some urinary symptoms  Has bladder pain when she has to go  Does not burn to urinate  No odor  No pink color or blood   More frequently when she is drinking more  Drinking more makes it harder to eat    No nsaids except for 81 mg asa daily  Takes amlodipine 10 mg daily for HTN   BP Readings from Last 3 Encounters:  07/03/22 (!) 140/65  06/22/22 (!) 140/60  06/11/22 (!) 161/60   Always lower at home  Hydralazine 10 mg bid Amlodipine 10 mg qhs  No longer having weak spells Feels kind of weak all the time   Lab Results  Component Value Date   HGBA1C 5.5 02/03/2022   Results for orders placed or performed in visit on 07/03/22  POCT Urinalysis Dipstick (Automated)  Result Value Ref Range   Color, UA Dark Yellow    Clarity, UA Clear    Glucose, UA Negative Negative   Bilirubin, UA Negative    Ketones, UA Negative    Spec Grav, UA 1.025 1.010 - 1.025   Blood, UA Negatie    pH, UA 6.0 5.0 - 8.0   Protein, UA Positive (A) Negative   Urobilinogen, UA 0.2 0.2 or 1.0 E.U./dL   Nitrite, UA Negative    Leukocytes, UA Small (1+) (A) Negative    Patient Active Problem List   Diagnosis Date Noted   Bladder pain 07/03/2022   Hyperkalemia 07/03/2022   Falls frequently 03/10/2022   Osteoarthritis of both knees 10/29/2021   Lower abdominal pain 07/23/2021   Diverticulosis 07/23/2021   Cervical  radicular pain (left) 09/12/2020   Cervical disc disorder with radiculopathy of cervical region 09/12/2020   Cervical spondylosis 09/12/2020   Cervical fusion syndrome (C4-T1 ACDF) 09/12/2020   Neuropathic pain of hand, left 09/12/2020   Chronic pain syndrome 09/12/2020   Failed neck syndrome 07/30/2020   Left hand pain 07/30/2020   Weakness 05/01/2020   Grief reaction 11/17/2019   Cervical stenosis of spinal canal 11/17/2019   Hearing loss 11/17/2019   Poor balance 06/12/2019   UTI (urinary tract infection) 01/25/2019   Screening mammogram, encounter for 05/23/2018   Substernal chest pain 08/16/2017   Atherosclerosis of aorta ([place]) 03/09/2017   Weight loss 02/23/2017   Routine general medical examination at a health care facility 07/05/2015   Estrogen deficiency 07/05/2015   Prediabetes 01/01/2015   Lumbar spinal stenosis 11/13/2014   History of vertigo 10/05/2014   Leukocytosis 07/16/2014   Risk for falls 06/27/2014   Low back pain on right side with sciatica 07/24/2013   Edema 04/26/2013   Encounter for Medicare annual wellness exam 04/11/2013   Lymphocytosis 01/05/2012   GERD (gastroesophageal reflux disease)  Osteopenia 01/30/2009   Constipation 11/01/2007   Depression with anxiety 02/14/2007   Hyperlipidemia LDL goal <130 02/04/2007   Gout 02/04/2007   Essential hypertension 02/04/2007   DIVERTICULOSIS, COLON 02/04/2007   Stage 3b chronic kidney disease (CKD) ([place]) 02/04/2007   OSTEOARTHRITIS 02/04/2007   DEGENERATIVE Marcus DISEASE 02/04/2007   Past Medical History:  Diagnosis Date   Ankylosis of lumbar spine    Basal cell carcinoma    removed   Cervical spine pain    Chest pain    a. 02/2017 MV: EF 54%, small, distal anterior wall/apical infacrt (likely misregistration), no ischemia-->Low risk; b. 02/2017 Echo: EF 55-60%, mild MR, PASP 6mHg.   Chronic back pain    stenosis   Constipation    takes Colace daily   Depression    takes Zoloft daily    Diverticulosis of colon    GERD (gastroesophageal reflux disease)    atypical chest pain   Gout    hx of;was on Allopurinol but taken off 6 wks ago by medical MD   History of diverticulitis of colon    HLD (hyperlipidemia)    takes Pravastatin daily   HTN (hypertension)    takes Amlodipine and carvedilol   Lumbar spondylosis with myelopathy    Numbness    OA (osteoarthritis)    right knee   Osteopenia    takes Calcium and Vit D daily   Scoliosis    Vertigo    takes Antivert daily as needed   Past Surgical History:  Procedure Laterality Date   APPENDECTOMY  at age 88  [place] 04[place]   CHOLECYSTECTOMY  1970   COLONOSCOPY     KNEE SURGERY Right    Medial meniscus tear   LUMBAR LAMINECTOMY/DECOMPRESSION MICRODISCECTOMY Bilateral 11/13/2014   Procedure: Lumbar one-two Bilateral laminectomy and foraminotomy, Left Lumbar one-two microdiscectomy ;  Surgeon: RFaythe Ghee MD;  Location: [facility];  Service: Neurosurgery;  Laterality: Bilateral;   NECK SURGERY  [phone]  x 3; fusions   PARTIAL COLECTOMY  09/1999   d/t diverticulitis   TUBAL LIGATION     Social History   Tobacco Use   Smoking status: Never   Smokeless tobacco: Never  Vaping Use   Vaping Use: Never used  Substance Use Topics   Alcohol use: Never    Alcohol/week: 0.0 standard drinks of alcohol   Drug use: Never   Family History  Problem Relation Age of Onset   Cancer Father        throat and bladder   Diabetes Father    Hypertension Father    Heart attack Father    Stroke Mother    Alzheimer's disease Mother    Hypertension Mother    Allergies  Allergen Reactions   Ace Inhibitors     REACTION: increased K+, increased creat.   Bactrim [Sulfamethoxazole-Trimethoprim]     rash   Cephalosporins     REACTION: reaction not known   Colesevelam     REACTION: constipation   Hctz [Hydrochlorothiazide]     Makes her feel "bad"   Losartan     Increased K    Lovastatin     REACTION: doesn't work   Lyrica [Pregabalin]     Felt bad    Raloxifene     REACTION: cramps   Rosuvastatin     REACTION: myalgia   Simvastatin     REACTION: myalgia   Current Outpatient Medications on File  Prior to Visit  Medication Sig Dispense Refill   amLODipine (NORVASC) 10 MG tablet TAKE 1 TABLET DAILY 90 tablet 1   aspirin EC 81 MG tablet Take 1 tablet (81 mg total) by mouth daily. 90 tablet 3   Calcium Carbonate-Vitamin D 500-125 MG-UNIT TABS Take by mouth.     Cranberry 250 MG CAPS Take 1 capsule by mouth daily.     docusate sodium (COLACE) 100 MG capsule Take 1-2 capsules (100-200 mg total) by mouth 2 (two) times daily. 100 mg in the morning and 200 mg at night 180 capsule 1   famotidine (PEPCID) 20 MG tablet TAKE 1 TABLET TWICE A DAY 180 tablet 3   fish oil-omega-3 fatty acids 1000 MG capsule Take 1 g by mouth at bedtime.     hydrALAZINE (APRESOLINE) 10 MG tablet TAKE 1 TABLET IN THE       MORNING AND AT BEDTIME 180 tablet 2   meclizine (ANTIVERT) 25 MG tablet Take 25 mg by mouth 4 (four) times daily as needed for dizziness.     pravastatin (PRAVACHOL) 20 MG tablet TAKE 1 TABLET DAILY 90 tablet 1   sertraline (ZOLOFT) 100 MG tablet Take 1.5 tablets (150 mg total) by mouth daily. 135 tablet 3   Tetrahydrozoline HCl (EYE DROPS OP) Apply 1-2 drops to eye as needed (for dry eye).     No current facility-administered medications on file prior to visit.      Review of Systems  Constitutional:  Negative for activity change, appetite change, fatigue, fever and unexpected weight change.  HENT:  Negative for congestion, ear pain, rhinorrhea, sinus pressure and sore throat.   Eyes:  Negative for pain, redness and visual disturbance.  Respiratory:  Negative for cough, shortness of breath and wheezing.   Cardiovascular:  Negative for chest pain and palpitations.  Gastrointestinal:  Negative for abdominal pain, blood in stool, constipation and diarrhea.  Endocrine:  Negative for polydipsia and polyuria.  Genitourinary:  Positive for frequency and pelvic pain. Negative for decreased urine volume, difficulty urinating, dysuria, flank pain, hematuria, urgency, vaginal discharge and vaginal pain.  Musculoskeletal:  Negative for arthralgias, back pain and myalgias.  Skin:  Negative for pallor and rash.  Allergic/Immunologic: Negative for environmental allergies.  Neurological:  Positive for weakness. Negative for dizziness, syncope and headaches.  Hematological:  Negative for adenopathy. Does not bruise/bleed easily.  Psychiatric/Behavioral:  Negative for decreased concentration and dysphoric mood. The patient is not nervous/anxious.        Objective:   Physical Exam Constitutional:      General: She is not in acute distress.    Appearance: Normal appearance. She is well-developed and normal weight. She is not ill-appearing or diaphoretic.  HENT:     Head: Normocephalic and atraumatic.  Eyes:     General: No scleral icterus.    Conjunctiva/sclera: Conjunctivae normal.     Pupils: Pupils are equal, round, and reactive to light.  Neck:     Thyroid: No thyromegaly.     Vascular: No carotid bruit or JVD.  Cardiovascular:     Rate and Rhythm: Normal rate and regular rhythm.     Heart sounds: Normal heart sounds.     No gallop.  Pulmonary:     Effort: Pulmonary effort is normal. No respiratory distress.     Breath sounds: Normal breath sounds. No wheezing or rales.  Abdominal:     General: Abdomen is flat. Bowel sounds are normal. There is no distension or abdominal bruit.  Palpations: Abdomen is soft. There is no hepatomegaly, splenomegaly, mass or pulsatile mass.     Tenderness: There is abdominal tenderness in the suprapubic area. There is no right CVA tenderness, left CVA tenderness, guarding or rebound. Negative signs include Murphy's sign.  Musculoskeletal:     Cervical back: Normal range of motion and neck supple.     Right lower leg: No  edema.     Left lower leg: No edema.  Lymphadenopathy:     Cervical: No cervical adenopathy.  Skin:    General: Skin is warm and dry.     Coloration: Skin is not pale.     Findings: No rash.  Neurological:     Mental Status: She is alert.     Coordination: Coordination normal.     Deep Tendon Reflexes: Reflexes are normal and symmetric. Reflexes normal.  Psychiatric:        Mood and Affect: Mood normal.           Assessment & Plan:   Problem List Items Addressed This Visit       Cardiovascular and Mediastinum   Essential hypertension (Chronic)    bp in fair control at this time  BP Readings from Last 1 Encounters:  07/03/22 (!) 140/65  No changes needed Most recent labs reviewed  Disc lifstyle change with low sodium diet and exercise          Genitourinary   Stage 3b chronic kidney disease (CKD) ([place]) - Primary    Recent GFR 36.3  This has been coming down (58 in [place]) Drinking more water Mildly high K last 2 draws as well  No K containing supplements   Now having some bladder pain and ua has sm leuk Will tx for uti, get cx and adv  Then plan next labs  If K higher will need to tx and consider nephrology referral        UTI (urinary tract infection)    Bladder pain when she needs to urinate Small leuk Working on water intake  bp is stable  Cr is down   tx with cipro 250 renal dose 2 d Disc poss side eff Cannot take bactrim or cephalosporins  Pend cx        Relevant Orders   Urine Culture     Other   Bladder pain    With frequency but not dysuria  Drinking more water ua with sm leuk Will tx with 3 d of renal dose cipro pend cx       Hyperkalemia    Mild in setting of CKD 5.5 No k containing products or change in diet May be due to renal insuff  Currently tx uti  Then plan next labs If this goes up further will need tx and likely nephrology ref        Other Visit Diagnoses     Dysuria       Relevant Orders   POCT Urinalysis  Dipstick (Automated) (Completed)

## 2022-07-03 NOTE — Patient Instructions (Addendum)
Drink your fluids    Avoid vitamins with potassium   Monitor your blood pressure  Continue your current medicines   Take the low dose cipro for uti once daily for 3 days  (with food)  We will call you Monday about your urine culture   Then we will make a plan for next kidney labs   We need to watch the kidney numbers and potassium  If your bladder symptoms worsen or fail to improve let me know

## 2022-07-03 NOTE — Telephone Encounter (Signed)
No appts avail today-please have her bring a sample for ua Thanks

## 2022-07-03 NOTE — Assessment & Plan Note (Signed)
Mild in setting of CKD 5.5 No k containing products or change in diet May be due to renal insuff  Currently tx uti  Then plan next labs If this goes up further will need tx and likely nephrology ref

## 2022-07-03 NOTE — Assessment & Plan Note (Signed)
bp in fair control at this time  BP Readings from Last 1 Encounters:  07/03/22 (!) 140/65   No changes needed Most recent labs reviewed  Disc lifstyle change with low sodium diet and exercise

## 2022-07-03 NOTE — Assessment & Plan Note (Signed)
Bladder pain when she needs to urinate Small leuk Working on water intake  bp is stable  Cr is down   tx with cipro 250 renal dose 2 d Disc poss side eff Cannot take bactrim or cephalosporins  Pend cx

## 2022-07-03 NOTE — Assessment & Plan Note (Signed)
Recent GFR 36.3  This has been coming down (56 in [place]) Drinking more water Mildly high K last 2 draws as well  No K containing supplements   Now having some bladder pain and ua has sm leuk Will tx for uti, get cx and adv  Then plan next labs  If K higher will need to tx and consider nephrology referral

## 2022-07-03 NOTE — Telephone Encounter (Signed)
PCP had a cancellation so f/u appt scheduled at 4pm today

## 2022-07-04 LAB — URINE CULTURE
MICRO NUMBER:: 14018706
SPECIMEN QUALITY:: ADEQUATE

## 2022-07-05 ENCOUNTER — Encounter: Payer: Self-pay | Admitting: Family Medicine

## 2022-07-07 ENCOUNTER — Other Ambulatory Visit (INDEPENDENT_AMBULATORY_CARE_PROVIDER_SITE_OTHER): Payer: Medicare HMO

## 2022-07-07 DIAGNOSIS — E875 Hyperkalemia: Secondary | ICD-10-CM | POA: Diagnosis not present

## 2022-07-07 DIAGNOSIS — N1832 Chronic kidney disease, stage 3b: Secondary | ICD-10-CM | POA: Diagnosis not present

## 2022-07-07 DIAGNOSIS — N3000 Acute cystitis without hematuria: Secondary | ICD-10-CM

## 2022-07-07 LAB — POC URINALSYSI DIPSTICK (AUTOMATED)
Bilirubin, UA: NEGATIVE
Blood, UA: NEGATIVE
Glucose, UA: NEGATIVE
Ketones, UA: NEGATIVE
Leukocytes, UA: NEGATIVE
Nitrite, UA: NEGATIVE
Protein, UA: POSITIVE — AB
Spec Grav, UA: 1.015 (ref 1.010–1.025)
Urobilinogen, UA: 0.2 E.U./dL
pH, UA: 6 (ref 5.0–8.0)

## 2022-07-07 LAB — BASIC METABOLIC PANEL
BUN: 23 mg/dL (ref 6–23)
CO2: 29 mEq/L (ref 19–32)
Calcium: 9.6 mg/dL (ref 8.4–10.5)
Chloride: 100 mEq/L (ref 96–112)
Creatinine, Ser: 1.05 mg/dL (ref 0.40–1.20)
GFR: 46.49 mL/min — ABNORMAL LOW (ref >60.00)
Glucose, Bld: 89 mg/dL (ref 70–99)
Potassium: 5.3 mEq/L — ABNORMAL HIGH (ref 3.5–5.1)
Sodium: 139 mEq/L (ref 135–145)

## 2022-07-07 NOTE — Addendum Note (Signed)
Addended by: Loura Pardon A on: 07/07/2022 08:58 AM   Modules accepted: Orders

## 2022-07-07 NOTE — Telephone Encounter (Signed)
Pt called in this am and has scheduled a lab appt

## 2022-07-07 NOTE — Telephone Encounter (Signed)
Pt called stating she spoke to [employer] via MyChart & was told to schedule labs, pt is coming in today, 07/07/22. Pt responded to Tower's question, as to how was her bladder symptoms. Pt states she isn't in as much pain she was in before. Call back # [phone]

## 2022-07-07 NOTE — Telephone Encounter (Signed)
I ordered labs Also another ua if she is able to give a sample (if she cannot it is ok)

## 2022-07-08 ENCOUNTER — Telehealth: Payer: Self-pay | Admitting: *Deleted

## 2022-07-08 ENCOUNTER — Encounter: Payer: Self-pay | Admitting: Family Medicine

## 2022-07-08 DIAGNOSIS — N1832 Chronic kidney disease, stage 3b: Secondary | ICD-10-CM

## 2022-07-08 NOTE — Telephone Encounter (Signed)
I put the referral in  Let us know if you don't hear in 1-2 weeks

## 2022-07-08 NOTE — Telephone Encounter (Signed)
Pt sent a mychart message to PCP saying:  Dr. Glori Bickers, I would like to see a doctor in [place] if you have one In mind.  I was hoping that wouldn't be necessary, but I will do whatever you think is best.     Thanks for your help.                                              Linda Robinson

## 2022-07-14 ENCOUNTER — Encounter: Payer: Self-pay | Admitting: Family Medicine

## 2022-07-14 ENCOUNTER — Ambulatory Visit (INDEPENDENT_AMBULATORY_CARE_PROVIDER_SITE_OTHER): Payer: Medicare HMO | Admitting: Family Medicine

## 2022-07-14 ENCOUNTER — Encounter: Payer: Self-pay | Admitting: *Deleted

## 2022-07-14 ENCOUNTER — Telehealth: Payer: Self-pay | Admitting: Family Medicine

## 2022-07-14 VITALS — BP 140/80 | HR 61 | Temp 97.8°F | Ht 60.0 in | Wt 134.5 lb

## 2022-07-14 DIAGNOSIS — N1832 Chronic kidney disease, stage 3b: Secondary | ICD-10-CM

## 2022-07-14 DIAGNOSIS — I10 Essential (primary) hypertension: Secondary | ICD-10-CM | POA: Diagnosis not present

## 2022-07-14 DIAGNOSIS — Z87440 Personal history of urinary (tract) infections: Secondary | ICD-10-CM

## 2022-07-14 LAB — POCT UA - MICROSCOPIC ONLY
Bacteria, U Microscopic: 0
Casts, Ur, LPF, POC: 0
Crystals, Ur, HPF, POC: 0
RBC, Urine, Miroscopic: 0 (ref 0–2)
Yeast, UA: 0

## 2022-07-14 LAB — POC URINALSYSI DIPSTICK (AUTOMATED)
Bilirubin, UA: NEGATIVE
Blood, UA: NEGATIVE
Glucose, UA: NEGATIVE
Ketones, UA: NEGATIVE
Nitrite, UA: NEGATIVE
Protein, UA: POSITIVE — AB
Spec Grav, UA: 1.015 (ref 1.010–1.025)
Urobilinogen, UA: 0.2 E.U./dL
pH, UA: 6 (ref 5.0–8.0)

## 2022-07-14 LAB — BASIC METABOLIC PANEL
BUN: 26 mg/dL — ABNORMAL HIGH (ref 6–23)
CO2: 30 mEq/L (ref 19–32)
Calcium: 9.7 mg/dL (ref 8.4–10.5)
Chloride: 99 mEq/L (ref 96–112)
Creatinine, Ser: 1.07 mg/dL (ref 0.40–1.20)
GFR: 45.44 mL/min — ABNORMAL LOW (ref >60.00)
Glucose, Bld: 89 mg/dL (ref 70–99)
Potassium: 4.7 mEq/L (ref 3.5–5.1)
Sodium: 139 mEq/L (ref 135–145)

## 2022-07-14 NOTE — Assessment & Plan Note (Signed)
bp in fair control at this time / stable  BP Readings from Last 1 Encounters:  07/14/22 (!) 140/80  intol of many medications  No changes needed Most recent labs reviewed  Disc lifstyle change with low sodium diet and exercise  Plan to continue amlodipie '10mg'$  daily  Hydralazine 10 mg bid  Lab today

## 2022-07-14 NOTE — Telephone Encounter (Signed)
Tanisha from [facility] called and stated if patient need a renal artery duplex or regular ultrasound. Call back number [phone].

## 2022-07-14 NOTE — Telephone Encounter (Signed)
Tanisha notified of Dr. Marliss Coots comments

## 2022-07-14 NOTE — Progress Notes (Signed)
Subjective:    Patient ID: Linda Robinson, female    DOB: [birth year], 88 y.o.   MRN: [id number]  HPI Pt presents for f/u of CKD  Wt Readings from Last 3 Encounters:  07/14/22 134 lb 8 oz (61 kg)  07/03/22 135 lb 9.6 oz (61.5 kg)  06/22/22 134 lb 8 oz (61 kg)   26.27 kg/m  A little more weak today  Energy level is up and down     Lab Results  Component Value Date   CREATININE 1.05 07/07/2022   BUN 23 07/07/2022   NA 139 07/07/2022   K 5.3 (H) 07/07/2022   CL 100 07/07/2022   CO2 29 07/07/2022   GFR 46.49 -this was improved   Really drinking water  Watching her labels for potassium   No urinary symptoms Just baseline urgency   Stays constipated baseline   Some protein in urine  Was tx for a uti and the culture came back contaminated   Was referred to nephrology    Results for orders placed or performed in visit on 07/14/22  POCT Urinalysis Dipstick (Automated)  Result Value Ref Range   Color, UA Yellow    Clarity, UA Clear    Glucose, UA Negative Negative   Bilirubin, UA Negative    Ketones, UA Negative    Spec Grav, UA 1.015 1.010 - 1.025   Blood, UA Negative    pH, UA 6.0 5.0 - 8.0   Protein, UA Positive (A) Negative   Urobilinogen, UA 0.2 0.2 or 1.0 E.U./dL   Nitrite, UA Negative    Leukocytes, UA Small (1+) (A) Negative  POCT UA - Microscopic Only  Result Value Ref Range   WBC, Ur, HPF, POC 0-1 0 - 5   RBC, Urine, Miroscopic 0 0 - 2   Bacteria, U Microscopic 0 None - Trace   Mucus, UA few    Epithelial cells, urine per micros few    Crystals, Ur, HPF, POC 0    Casts, Ur, LPF, POC 0    Yeast, UA 0      HTN BP Readings from Last 3 Encounters:  07/14/22 (!) 140/80  07/03/22 (!) 140/65  06/22/22 (!) 140/60    Pulse Readings from Last 3 Encounters:  07/14/22 61  07/03/22 71  06/22/22 66   Amlodipine 10 mg daily  Hydralazine 10 mg bid  Intolerant of a lot of medications for bp incl ace and arb  Prone to inc K   Patient Active  Problem List   Diagnosis Date Noted   Bladder pain 07/03/2022   Hyperkalemia 07/03/2022   Falls frequently 03/10/2022   Osteoarthritis of both knees 10/29/2021   Lower abdominal pain 07/23/2021   Diverticulosis 07/23/2021   Cervical radicular pain (left) 09/12/2020   Cervical disc disorder with radiculopathy of cervical region 09/12/2020   Cervical spondylosis 09/12/2020   Cervical fusion syndrome (C4-T1 ACDF) 09/12/2020   Neuropathic pain of hand, left 09/12/2020   Chronic pain syndrome 09/12/2020   Failed neck syndrome 07/30/2020   Left hand pain 07/30/2020   Weakness 05/01/2020   Grief reaction 11/17/2019   Cervical stenosis of spinal canal 11/17/2019   Hearing loss 11/17/2019   Poor balance 06/12/2019   UTI (urinary tract infection) 01/25/2019   Screening mammogram, encounter for 05/23/2018   Substernal chest pain 08/16/2017   Atherosclerosis of aorta ([place]) 03/09/2017   Weight loss 02/23/2017   Routine general medical examination at a health care facility 07/05/2015   Estrogen  deficiency 07/05/2015   Prediabetes 01/01/2015   Lumbar spinal stenosis 11/13/2014   History of vertigo 10/05/2014   Leukocytosis 07/16/2014   Risk for falls 06/27/2014   Low back pain on right side with sciatica 07/24/2013   Edema 04/26/2013   Encounter for Medicare annual wellness exam 04/11/2013   Lymphocytosis 01/05/2012   GERD (gastroesophageal reflux disease)    Osteopenia 01/30/2009   Constipation 11/01/2007   Depression with anxiety 02/14/2007   Hyperlipidemia LDL goal <130 02/04/2007   Gout 02/04/2007   Essential hypertension 02/04/2007   DIVERTICULOSIS, COLON 02/04/2007   Stage 3b chronic kidney disease (CKD) ([place]) 02/04/2007   OSTEOARTHRITIS 02/04/2007   DEGENERATIVE [place] DISEASE 02/04/2007   Past Medical History:  Diagnosis Date   Ankylosis of lumbar spine    Basal cell carcinoma    removed   Cervical spine pain    Chest pain    a. 02/2017 MV: EF 54%, small, distal anterior  wall/apical infacrt (likely misregistration), no ischemia-->Low risk; b. 02/2017 Echo: EF 55-60%, mild MR, PASP 51mHg.   Chronic back pain    stenosis   Constipation    takes Colace daily   Depression    takes Zoloft daily   Diverticulosis of colon    GERD (gastroesophageal reflux disease)    atypical chest pain   Gout    hx of;was on Allopurinol but taken off 6 wks ago by medical MD   History of diverticulitis of colon    HLD (hyperlipidemia)    takes Pravastatin daily   HTN (hypertension)    takes Amlodipine and carvedilol   Lumbar spondylosis with myelopathy    Numbness    OA (osteoarthritis)    right knee   Osteopenia    takes Calcium and Vit D daily   Scoliosis    Vertigo    takes Antivert daily as needed   Past Surgical History:  Procedure Laterality Date   APPENDECTOMY  at age 88  BBrushRight 04/2006   CHOLECYSTECTOMY  1970   COLONOSCOPY     KNEE SURGERY Right    Medial meniscus tear   LUMBAR LAMINECTOMY/DECOMPRESSION MICRODISCECTOMY Bilateral 11/13/2014   Procedure: Lumbar one-two Bilateral laminectomy and foraminotomy, Left Lumbar one-two microdiscectomy ;  Surgeon: RFaythe Ghee MD;  Location: [facility];  Service: Neurosurgery;  Laterality: Bilateral;   NECK SURGERY  [phone]  x 3; fusions   PARTIAL COLECTOMY  09/1999   d/t diverticulitis   TUBAL LIGATION     Social History   Tobacco Use   Smoking status: Never   Smokeless tobacco: Never  Vaping Use   Vaping Use: Never used  Substance Use Topics   Alcohol use: Never    Alcohol/week: 0.0 standard drinks of alcohol   Drug use: Never   Family History  Problem Relation Age of Onset   Cancer Father        throat and bladder   Diabetes Father    Hypertension Father    Heart attack Father    Stroke Mother    Alzheimer's disease Mother    Hypertension Mother    Allergies  Allergen Reactions   Ace Inhibitors     REACTION: increased K+, increased creat.    Bactrim [Sulfamethoxazole-Trimethoprim]     rash   Cephalosporins     REACTION: reaction not known   Colesevelam     REACTION: constipation   Hctz [Hydrochlorothiazide]     Makes her feel "  bad"   Losartan     Increased K   Lovastatin     REACTION: doesn't work   Lyrica [Pregabalin]     Felt bad    Raloxifene     REACTION: cramps   Rosuvastatin     REACTION: myalgia   Simvastatin     REACTION: myalgia   Current Outpatient Medications on File Prior to Visit  Medication Sig Dispense Refill   amLODipine (NORVASC) 10 MG tablet TAKE 1 TABLET DAILY 90 tablet 1   aspirin EC 81 MG tablet Take 1 tablet (81 mg total) by mouth daily. 90 tablet 3   Calcium Carbonate-Vitamin D 500-125 MG-UNIT TABS Take by mouth.     Cranberry 250 MG CAPS Take 1 capsule by mouth daily.     docusate sodium (COLACE) 100 MG capsule Take 1-2 capsules (100-200 mg total) by mouth 2 (two) times daily. 100 mg in the morning and 200 mg at night 180 capsule 1   famotidine (PEPCID) 20 MG tablet TAKE 1 TABLET TWICE A DAY 180 tablet 3   fish oil-omega-3 fatty acids 1000 MG capsule Take 1 g by mouth at bedtime.     hydrALAZINE (APRESOLINE) 10 MG tablet TAKE 1 TABLET IN THE       MORNING AND AT BEDTIME 180 tablet 2   meclizine (ANTIVERT) 25 MG tablet Take 25 mg by mouth 4 (four) times daily as needed for dizziness.     pravastatin (PRAVACHOL) 20 MG tablet TAKE 1 TABLET DAILY 90 tablet 1   sertraline (ZOLOFT) 100 MG tablet Take 1.5 tablets (150 mg total) by mouth daily. 135 tablet 3   Tetrahydrozoline HCl (EYE DROPS OP) Apply 1-2 drops to eye as needed (for dry eye).     No current facility-administered medications on file prior to visit.    Review of Systems  Constitutional:  Positive for fatigue. Negative for activity change, appetite change, fever and unexpected weight change.  HENT:  Negative for congestion, ear pain, rhinorrhea, sinus pressure and sore throat.   Eyes:  Negative for pain, redness and visual  disturbance.  Respiratory:  Negative for cough, shortness of breath and wheezing.   Cardiovascular:  Negative for chest pain and palpitations.  Gastrointestinal:  Negative for abdominal pain, blood in stool, constipation and diarrhea.  Endocrine: Negative for polydipsia and polyuria.  Genitourinary:  Negative for dysuria, frequency, hematuria, pelvic pain and urgency.       No change in frequency   Musculoskeletal:  Negative for arthralgias, back pain and myalgias.  Skin:  Negative for pallor and rash.  Allergic/Immunologic: Negative for environmental allergies.  Neurological:  Negative for dizziness, syncope and headaches.       Less episodic weakness   Hematological:  Negative for adenopathy. Does not bruise/bleed easily.  Psychiatric/Behavioral:  Negative for decreased concentration and dysphoric mood. The patient is not nervous/anxious.        Objective:   Physical Exam Constitutional:      General: She is not in acute distress.    Appearance: Normal appearance. She is well-developed and normal weight. She is not ill-appearing or diaphoretic.  HENT:     Head: Normocephalic and atraumatic.  Eyes:     General: No scleral icterus.    Conjunctiva/sclera: Conjunctivae normal.     Pupils: Pupils are equal, round, and reactive to light.  Neck:     Thyroid: No thyromegaly.     Vascular: No carotid bruit or JVD.  Cardiovascular:     Rate and  Rhythm: Normal rate and regular rhythm.     Heart sounds: Normal heart sounds.     No gallop.  Pulmonary:     Effort: Pulmonary effort is normal. No respiratory distress.     Breath sounds: Normal breath sounds. No wheezing or rales.  Abdominal:     General: There is no distension or abdominal bruit.     Palpations: Abdomen is soft. There is no mass.     Tenderness: There is no abdominal tenderness. There is no right CVA tenderness, left CVA tenderness, guarding or rebound.  Musculoskeletal:     Cervical back: Normal range of motion and neck  supple.     Right lower leg: No edema.     Left lower leg: No edema.  Lymphadenopathy:     Cervical: No cervical adenopathy.  Skin:    General: Skin is warm and dry.     Coloration: Skin is not pale.     Findings: No rash.  Neurological:     Mental Status: She is alert.     Coordination: Coordination normal.     Deep Tendon Reflexes: Reflexes are normal and symmetric. Reflexes normal.  Psychiatric:        Mood and Affect: Mood normal.           Assessment & Plan:   Problem List Items Addressed This Visit       Cardiovascular and Mediastinum   Essential hypertension (Chronic)    bp in fair control at this time / stable  BP Readings from Last 1 Encounters:  07/14/22 (!) 140/80  intol of many medications  No changes needed Most recent labs reviewed  Disc lifstyle change with low sodium diet and exercise  Plan to continue amlodipie '10mg'$  daily  Hydralazine 10 mg bid  Lab today      Relevant Orders   Basic metabolic panel   US Renal     Genitourinary   Stage 3b chronic kidney disease (CKD) ([place]) - Primary    Last labs improved with GFR of 46.4 K has been mildly elevated (no ace or arb or nsaid)  Enc to avoid supplements with K  Re check today   Will need to treat if this gets higher  Pend bmet today  Referral was done to nephrology-pending appt  Renal US ordered today  ua with protein  Last urine cx was contaminated No bladder c/o except baseline overactive bladder      Relevant Orders   Basic metabolic panel   US Renal   POCT UA - Microscopic Only (Completed)   Other Visit Diagnoses     History of UTI       Relevant Orders   POCT Urinalysis Dipstick (Automated) (Completed)   POCT UA - Microscopic Only (Completed)

## 2022-07-14 NOTE — Patient Instructions (Signed)
Labs today for kidney function and potassium  Your urine still has some protein   Watch your blood pressure at home if you can   While we are waiting for the kidney doctor referral  I will place an order for an ultrasound of the kidneys in [place] should get a call in the next 1-2 weeks  If not, let us know  Keep drinking lots of fluids

## 2022-07-14 NOTE — Assessment & Plan Note (Signed)
Last labs improved with GFR of 46.4 K has been mildly elevated (no ace or arb or nsaid)  Enc to avoid supplements with K  Re check today   Will need to treat if this gets higher  Pend bmet today  Referral was done to nephrology-pending appt  Renal US ordered today  ua with protein  Last urine cx was contaminated No bladder c/o except baseline overactive bladder

## 2022-07-14 NOTE — Telephone Encounter (Signed)
A regular ultrasound Thanks!

## 2022-07-21 ENCOUNTER — Ambulatory Visit
Admission: RE | Admit: 2022-07-21 | Discharge: 2022-07-21 | Disposition: A | Discharge status: Home / Self Care | Payer: Medicare HMO | Source: Ambulatory Visit | Attending: Family Medicine | Admitting: Family Medicine

## 2022-07-21 DIAGNOSIS — N1832 Chronic kidney disease, stage 3b: Secondary | ICD-10-CM

## 2022-07-21 DIAGNOSIS — I10 Essential (primary) hypertension: Secondary | ICD-10-CM

## 2022-07-21 DIAGNOSIS — N2889 Other specified disorders of kidney and ureter: Secondary | ICD-10-CM | POA: Diagnosis not present

## 2022-07-29 DIAGNOSIS — I10 Essential (primary) hypertension: Secondary | ICD-10-CM | POA: Diagnosis not present

## 2022-07-29 DIAGNOSIS — R8290 Unspecified abnormal findings in urine: Secondary | ICD-10-CM | POA: Diagnosis not present

## 2022-07-29 DIAGNOSIS — I129 Hypertensive chronic kidney disease with stage 1 through stage 4 chronic kidney disease, or unspecified chronic kidney disease: Secondary | ICD-10-CM | POA: Diagnosis not present

## 2022-07-29 DIAGNOSIS — R809 Proteinuria, unspecified: Secondary | ICD-10-CM | POA: Diagnosis not present

## 2022-08-05 ENCOUNTER — Encounter: Payer: Self-pay | Admitting: Family Medicine

## 2022-08-24 ENCOUNTER — Telehealth: Payer: Self-pay | Admitting: Family Medicine

## 2022-08-24 NOTE — Telephone Encounter (Signed)
Patient notified, placed letter up front with reception. She will come by this week to pick it up.

## 2022-08-24 NOTE — Telephone Encounter (Signed)
Pt called stating her mail box is across the street from her house & in order for the post office to move her mail box to her side of the street, her pcp would have to write a letter. Pt states it is a safety issue for her to cross the street in a wheelchair. Pt mentioned her mail box in within a curve & she could possibly get hurt trying to cross the street to check mail. Call back # [phone]

## 2022-08-24 NOTE — Telephone Encounter (Signed)
Letter is done and in IN box.

## 2022-10-12 ENCOUNTER — Ambulatory Visit (INDEPENDENT_AMBULATORY_CARE_PROVIDER_SITE_OTHER): Payer: Medicare HMO | Admitting: Family Medicine

## 2022-10-12 ENCOUNTER — Encounter: Payer: Self-pay | Admitting: Family Medicine

## 2022-10-12 VITALS — BP 136/68 | HR 61 | Temp 97.7°F | Ht 60.0 in | Wt 134.0 lb

## 2022-10-12 DIAGNOSIS — M25562 Pain in left knee: Secondary | ICD-10-CM

## 2022-10-12 DIAGNOSIS — R69 Illness, unspecified: Secondary | ICD-10-CM | POA: Diagnosis not present

## 2022-10-12 DIAGNOSIS — N1832 Chronic kidney disease, stage 3b: Secondary | ICD-10-CM

## 2022-10-12 DIAGNOSIS — M25569 Pain in unspecified knee: Secondary | ICD-10-CM | POA: Insufficient documentation

## 2022-10-12 DIAGNOSIS — G8929 Other chronic pain: Secondary | ICD-10-CM | POA: Diagnosis not present

## 2022-10-12 DIAGNOSIS — M25561 Pain in right knee: Secondary | ICD-10-CM | POA: Diagnosis not present

## 2022-10-12 DIAGNOSIS — F418 Other specified anxiety disorders: Secondary | ICD-10-CM | POA: Diagnosis not present

## 2022-10-12 DIAGNOSIS — R531 Weakness: Secondary | ICD-10-CM | POA: Diagnosis not present

## 2022-10-12 DIAGNOSIS — I10 Essential (primary) hypertension: Secondary | ICD-10-CM | POA: Diagnosis not present

## 2022-10-12 MED ORDER — SERTRALINE HCL 100 MG PO TABS: 150.0000 mg | ORAL_TABLET | Freq: Every day | ORAL | 3 refills | Status: DC

## 2022-10-12 NOTE — Patient Instructions (Addendum)
Please get back on the zoloft 150 mg daily (you can take one in the am and 1/2 pill at night but it does not matter what time you take them  If you feel worse (anxious or depressed) please let us know   Take care of yourself    Once you feel better I want you to get the shingles vaccine at a pharmacy    I want to get you an appt with Dr Lorelei Pont   Drink fluids Try and eat regular meals   In the future if you every have trouble with refills of anything please let us know!

## 2022-10-12 NOTE — Assessment & Plan Note (Signed)
I suepect today this may be linked to anxiety/stopping ssri  Generalized  Able to walk w/o assist

## 2022-10-12 NOTE — Progress Notes (Signed)
Subjective:    Patient ID: Linda Robinson, female    DOB: 02/02/1929, 89 y.o.   MRN: [id number]  HPI Pt present for f/u of HTN and chronic medical problems Anxiety   Knee pain and stiffness when she gets up   Wt Readings from Last 3 Encounters:  10/12/22 134 lb (60.8 kg)  07/14/22 134 lb 8 oz (61 kg)  07/03/22 135 lb 9.6 oz (61.5 kg)   26.17 kg/m   HTN bp is stable today  No cp or palpitations or headaches or edema  No side effects to medicines  BP Readings from Last 3 Encounters:  10/12/22 136/68  07/14/22 (!) 140/80  07/03/22 (!) 140/65    Pulse Readings from Last 3 Encounters:  10/12/22 61  07/14/22 61  07/03/22 71    Amlodipine 10  Hydralazine 10  Lab Results  Component Value Date   CREATININE 1.07 07/14/2022   BUN 26 (H) 07/14/2022   NA 139 07/14/2022   K 4.7 07/14/2022   CL 99 07/14/2022   CO2 30 07/14/2022   Lab Results  Component Value Date   HGBA1C 5.5 02/03/2022    Depression/anxiety  Could not get her zoloft from the pharmacy for a while Feels bad  Very weak and nervous especially in am   A lot of stress Her great grand daughter is unexpectedly pregnant  Hard time since losing her husband   Her left continues to bother her Uses a heating pad   Support - friend   Tired and weak when she feels anxious  Not suicidal at all  Son lives next door and he is great supprot   Knees hurt more when she tries to get up  Stiff and sore  Worse in the R -now it is both    Patient Active Problem List   Diagnosis Date Noted   Knee pain 10/12/2022   Bladder pain 07/03/2022   Hyperkalemia 07/03/2022   Falls frequently 03/10/2022   Osteoarthritis of both knees 10/29/2021   Lower abdominal pain 07/23/2021   Diverticulosis 07/23/2021   Cervical radicular pain (left) 09/12/2020   Cervical disc disorder with radiculopathy of cervical region 09/12/2020   Cervical spondylosis 09/12/2020   Cervical fusion syndrome (C4-T1 ACDF) 09/12/2020    Neuropathic pain of hand, left 09/12/2020   Chronic pain syndrome 09/12/2020   Failed neck syndrome 07/30/2020   Left hand pain 07/30/2020   Weakness 05/01/2020   Grief reaction 11/17/2019   Cervical stenosis of spinal canal 11/17/2019   Hearing loss 11/17/2019   Poor balance 06/12/2019   Screening mammogram, encounter for 05/23/2018   Substernal chest pain 08/16/2017   Atherosclerosis of aorta ([place]) 03/09/2017   Weight loss 02/23/2017   Routine general medical examination at a health care facility 07/05/2015   Estrogen deficiency 07/05/2015   Prediabetes 01/01/2015   Lumbar spinal stenosis 11/13/2014   History of vertigo 10/05/2014   Leukocytosis 07/16/2014   Risk for falls 06/27/2014   Low back pain on right side with sciatica 07/24/2013   Edema 04/26/2013   Encounter for Medicare annual wellness exam 04/11/2013   Lymphocytosis 01/05/2012   GERD (gastroesophageal reflux disease)    Osteopenia 01/30/2009   Constipation 11/01/2007   Depression with anxiety 02/14/2007   Hyperlipidemia LDL goal <130 02/04/2007   Gout 02/04/2007   Essential hypertension 02/04/2007   DIVERTICULOSIS, COLON 02/04/2007   Stage 3b chronic kidney disease (CKD) ([place]) 02/04/2007   OSTEOARTHRITIS 02/04/2007   DEGENERATIVE [place] DISEASE 02/04/2007  Past Medical History:  Diagnosis Date   Ankylosis of lumbar spine    Basal cell carcinoma    removed   Cervical spine pain    Chest pain    a. 02/2017 MV: EF 54%, small, distal anterior wall/apical infacrt (likely misregistration), no ischemia-->Low risk; b. 02/2017 Echo: EF 55-60%, mild MR, PASP 30mHg.   Chronic back pain    stenosis   Constipation    takes Colace daily   Depression    takes Zoloft daily   Diverticulosis of colon    GERD (gastroesophageal reflux disease)    atypical chest pain   Gout    hx of;was on Allopurinol but taken off 6 wks ago by medical MD   History of diverticulitis of colon    HLD (hyperlipidemia)    takes Pravastatin  daily   HTN (hypertension)    takes Amlodipine and carvedilol   Lumbar spondylosis with myelopathy    Numbness    OA (osteoarthritis)    right knee   Osteopenia    takes Calcium and Vit D daily   Scoliosis    Vertigo    takes Antivert daily as needed   Past Surgical History:  Procedure Laterality Date   APPENDECTOMY  at age 89  [place] 04/2006   CHOLECYSTECTOMY  1970   COLONOSCOPY     KNEE SURGERY Right    Medial meniscus tear   LUMBAR LAMINECTOMY/DECOMPRESSION MICRODISCECTOMY Bilateral 11/13/2014   Procedure: Lumbar one-two Bilateral laminectomy and foraminotomy, Left Lumbar one-two microdiscectomy ;  Surgeon: RFaythe Ghee MD;  Location: [facility];  Service: Neurosurgery;  Laterality: Bilateral;   NECK SURGERY  [phone]  x 3; fusions   PARTIAL COLECTOMY  09/1999   d/t diverticulitis   TUBAL LIGATION     Social History   Tobacco Use   Smoking status: Never   Smokeless tobacco: Never  Vaping Use   Vaping Use: Never used  Substance Use Topics   Alcohol use: Never    Alcohol/week: 0.0 standard drinks of alcohol   Drug use: Never   Family History  Problem Relation Age of Onset   Cancer Father        throat and bladder   Diabetes Father    Hypertension Father    Heart attack Father    Stroke Mother    Alzheimer's disease Mother    Hypertension Mother    Allergies  Allergen Reactions   Ace Inhibitors     REACTION: increased K+, increased creat.   Bactrim [Sulfamethoxazole-Trimethoprim]     rash   Cephalosporins     REACTION: reaction not known   Colesevelam     REACTION: constipation   Hctz [Hydrochlorothiazide]     Makes her feel "bad"   Losartan     Increased K   Lovastatin     REACTION: doesn't work   Lyrica [Pregabalin]     Felt bad    Raloxifene     REACTION: cramps   Rosuvastatin     REACTION: myalgia   Simvastatin     REACTION: myalgia   Current Outpatient Medications on File Prior to Visit   Medication Sig Dispense Refill   amLODipine (NORVASC) 10 MG tablet TAKE 1 TABLET DAILY 90 tablet 1   aspirin EC 81 MG tablet Take 1 tablet (81 mg total) by mouth daily. 90 tablet 3   Calcium Carbonate-Vitamin D 500-125 MG-UNIT TABS Take by mouth.  Cranberry 250 MG CAPS Take 1 capsule by mouth daily.     docusate sodium (COLACE) 100 MG capsule Take 1-2 capsules (100-200 mg total) by mouth 2 (two) times daily. 100 mg in the morning and 200 mg at night 180 capsule 1   famotidine (PEPCID) 20 MG tablet TAKE 1 TABLET TWICE A DAY 180 tablet 3   fish oil-omega-3 fatty acids 1000 MG capsule Take 1 g by mouth at bedtime.     hydrALAZINE (APRESOLINE) 10 MG tablet TAKE 1 TABLET IN THE       MORNING AND AT BEDTIME 180 tablet 2   meclizine (ANTIVERT) 25 MG tablet Take 25 mg by mouth 4 (four) times daily as needed for dizziness.     pravastatin (PRAVACHOL) 20 MG tablet TAKE 1 TABLET DAILY 90 tablet 1   Tetrahydrozoline HCl (EYE DROPS OP) Apply 1-2 drops to eye as needed (for dry eye).     No current facility-administered medications on file prior to visit.    Review of Systems  Constitutional:  Positive for fatigue. Negative for activity change, appetite change, fever and unexpected weight change.  HENT:  Negative for congestion, ear pain, rhinorrhea, sinus pressure and sore throat.   Eyes:  Negative for pain, redness and visual disturbance.  Respiratory:  Negative for cough, shortness of breath and wheezing.   Cardiovascular:  Negative for chest pain and palpitations.  Gastrointestinal:  Negative for abdominal pain, blood in stool, constipation and diarrhea.  Endocrine: Negative for polydipsia and polyuria.  Genitourinary:  Negative for dysuria, frequency and urgency.  Musculoskeletal:  Positive for arthralgias. Negative for back pain and myalgias.  Skin:  Negative for pallor and rash.  Allergic/Immunologic: Negative for environmental allergies.  Neurological:  Negative for dizziness, syncope and  headaches.       Feels weak all over when she is anxious and jittery  Hematological:  Negative for adenopathy. Does not bruise/bleed easily.  Psychiatric/Behavioral:  Positive for sleep disturbance. Negative for decreased concentration, dysphoric mood, self-injury and suicidal ideas. The patient is nervous/anxious.        Objective:   Physical Exam Constitutional:      General: She is not in acute distress.    Appearance: Normal appearance. She is well-developed. She is obese. She is not ill-appearing or diaphoretic.  HENT:     Head: Normocephalic and atraumatic.     Mouth/Throat:     Mouth: Mucous membranes are moist.  Eyes:     General: No scleral icterus.       Right eye: No discharge.        Left eye: No discharge.     Conjunctiva/sclera: Conjunctivae normal.     Pupils: Pupils are equal, round, and reactive to light.  Neck:     Thyroid: No thyromegaly.     Vascular: No carotid bruit or JVD.  Cardiovascular:     Rate and Rhythm: Normal rate and regular rhythm.     Heart sounds: Normal heart sounds.     No gallop.  Pulmonary:     Effort: Pulmonary effort is normal. No respiratory distress.     Breath sounds: Normal breath sounds. No wheezing or rales.  Abdominal:     General: There is no distension or abdominal bruit.     Palpations: Abdomen is soft.  Musculoskeletal:     Cervical back: Normal range of motion and neck supple. No tenderness.     Right lower leg: No edema.     Left lower leg: No  edema.     Comments: Atrophy in L hand baseline  Some crepitus bilat knees Medial joint line tenderness    Lymphadenopathy:     Cervical: No cervical adenopathy.  Skin:    General: Skin is warm and dry.     Coloration: Skin is not pale.     Findings: No rash.  Neurological:     Mental Status: She is alert.     Cranial Nerves: No cranial nerve deficit.     Coordination: Coordination normal.     Deep Tendon Reflexes: Reflexes are normal and symmetric. Reflexes normal.      Comments: Mild hand tramor in setting of anxiety No focal weakness besides baseline L hand  Psychiatric:        Attention and Perception: Attention normal.        Mood and Affect: Mood is anxious. Affect is not tearful.        Speech: Speech normal.        Cognition and Memory: Cognition normal.           Assessment & Plan:   Problem List Items Addressed This Visit       Cardiovascular and Mediastinum   Essential hypertension (Chronic)    bp in fair control at this time / stable  BP Readings from Last 1 Encounters:  10/12/22 136/68  intol of many medications  No changes needed Most recent labs reviewed  Disc lifstyle change with low sodium diet and exercise  Plan to continue amlodipie '10mg'$  daily  Hydralazine 10 mg bid          Genitourinary   Stage 3b chronic kidney disease (CKD) ([facility])    Last GFR was 45.4  Reassuring  Enc her to keep working on fluid intake         Other   Depression with anxiety - Primary    Anxiety is much worse off of sertraline after initiall imp wit h150 mg daily Stopped abruptly for ? Reason (per pt may be a pharmacy issue) Re started this  Expect improvement  Anxiety makes her feel more weak as well   Plan to re start (she pref 100 in am and 50 in pm) F/u 2-3 weeks  Enc her to talk to friends/family when she can  She declines counseling      Relevant Medications   sertraline (ZOLOFT) 100 MG tablet   Knee pain    More bilat knee pain and stiffness Harder to get up  Uses a cane  Interested in injections if she is a candidate  Likely OA Would like to see Dr Lorelei Pont- appt scheduled       Weakness    I suepect today this may be linked to anxiety/stopping ssri  Generalized  Able to walk w/o assist

## 2022-10-12 NOTE — Assessment & Plan Note (Signed)
More bilat knee pain and stiffness Harder to get up  Uses a cane  Interested in injections if she is a candidate  Likely OA Would like to see Dr Lorelei Pont- appt scheduled

## 2022-10-12 NOTE — Assessment & Plan Note (Signed)
Anxiety is much worse off of sertraline after initiall imp wit h150 mg daily Stopped abruptly for ? Reason (per pt may be a pharmacy issue) Re started this  Expect improvement  Anxiety makes her feel more weak as well   Plan to re start (she pref 100 in am and 50 in pm) F/u 2-3 weeks  Enc her to talk to friends/family when she can  She declines counseling

## 2022-10-12 NOTE — Assessment & Plan Note (Signed)
bp in fair control at this time / stable  BP Readings from Last 1 Encounters:  10/12/22 136/68  intol of many medications  No changes needed Most recent labs reviewed  Disc lifstyle change with low sodium diet and exercise  Plan to continue amlodipie '10mg'$  daily  Hydralazine 10 mg bid

## 2022-10-12 NOTE — Assessment & Plan Note (Signed)
Last GFR was 45.4  Reassuring  Enc her to keep working on fluid intake

## 2022-10-27 ENCOUNTER — Ambulatory Visit: Payer: Medicare HMO | Admitting: Family Medicine

## 2022-10-29 ENCOUNTER — Ambulatory Visit (INDEPENDENT_AMBULATORY_CARE_PROVIDER_SITE_OTHER): Payer: Medicare HMO | Admitting: Family Medicine

## 2022-10-29 ENCOUNTER — Encounter: Payer: Self-pay | Admitting: Family Medicine

## 2022-10-29 VITALS — BP 124/61 | HR 70 | Temp 97.8°F | Ht 60.0 in | Wt 133.0 lb

## 2022-10-29 DIAGNOSIS — F418 Other specified anxiety disorders: Secondary | ICD-10-CM

## 2022-10-29 DIAGNOSIS — I10 Essential (primary) hypertension: Secondary | ICD-10-CM | POA: Diagnosis not present

## 2022-10-29 DIAGNOSIS — R69 Illness, unspecified: Secondary | ICD-10-CM | POA: Diagnosis not present

## 2022-10-29 MED ORDER — SERTRALINE HCL 100 MG PO TABS: 100.0000 mg | ORAL_TABLET | Freq: Two times a day (BID) | ORAL | 3 refills | Status: DC

## 2022-10-29 NOTE — Patient Instructions (Addendum)
I placed a referral for some counseling (do do from home)  I think this would help If you don't get a call in 1-2 weeks notify me   Increase your zoloft (sertraline) to 100 mg twice daily  If any side effects or problems or if you feel worse let me know   Take care of yourself  Blood pressure looks good today   Follow up in about 3 months for an annual exam with labs prior

## 2022-10-29 NOTE — Assessment & Plan Note (Signed)
bp in fair control at this time / stable   (better on 2nd check when more calm) BP Readings from Last 1 Encounters:  10/29/22 124/61  intol of many medications  No changes needed Most recent labs reviewed  Disc lifstyle change with low sodium diet and exercise  Plan to continue amlodipie '10mg'$  daily  Hydralazine 10 mg bid

## 2022-10-29 NOTE — Assessment & Plan Note (Signed)
Some improvement since getting back on zoloft  Pt desires inc in dose- we can go up to max of 100 mg bid  She has not had any side eff or low sodium Reviewed stressors/ coping techniques/symptoms/ support sources/ tx options and side effects in detail today Disc stressors and worries in detail today  Very lonely but does reach out to people Feels isolated Is very pre occupied with her grandchildren and problems they have  I strongly enc her to continue counseling (would have to be at home)  She is open to that Referral done If [facility] does now work out with insurance may have to look elsewhere  No SI  Will f/u 3 mo for annual exam or earlier if needed

## 2022-10-29 NOTE — Progress Notes (Signed)
Subjective:    Patient ID: Linda Robinson, female    DOB: 18-Dec-1928, 89 y.o.   MRN: [id number]  HPI Pt presents for f/u of mood/anxiety   Wt Readings from Last 3 Encounters:  10/29/22 133 lb (60.3 kg)  10/12/22 134 lb (60.8 kg)  07/14/22 134 lb 8 oz (61 kg)   25.97 kg/m  Vitals:   10/29/22 1048  BP: (!) 152/70  Pulse: 70  Temp: 97.8 F (36.6 C)  SpO2: 97%    Last visit anxiety was much worse off sertraline (had stopped after a pharmacy issue)   Sertraline 50 mg  Takes 100 in am and 50 mg in pm  She declined counseling   Some improvement  Tolerates well  Was doing better   Then found out her grandson may have a liver diagnosis  It has made her very anxious  Feels so alone   Talks to sister in law  She broke her hip so has not been around   Has friends who she talks to   HTN bp is stable today  No cp or palpitations or headaches or edema  No side effects to medicines  BP Readings from Last 3 Encounters:  10/29/22 124/61  10/12/22 136/68  07/14/22 (!) 140/80     Amlodipine 10 mg daily Hydralazine 10 mg bid   GFR 45.4  Lab Results  Component Value Date   CREATININE 1.07 07/14/2022   BUN 26 (H) 07/14/2022   NA 139 07/14/2022   K 4.7 07/14/2022   CL 99 07/14/2022   CO2 30 07/14/2022   Patient Active Problem List   Diagnosis Date Noted   Knee pain 10/12/2022   Bladder pain 07/03/2022   Hyperkalemia 07/03/2022   Falls frequently 03/10/2022   Osteoarthritis of both knees 10/29/2021   Lower abdominal pain 07/23/2021   Diverticulosis 07/23/2021   Cervical radicular pain (left) 09/12/2020   Cervical disc disorder with radiculopathy of cervical region 09/12/2020   Cervical spondylosis 09/12/2020   Cervical fusion syndrome (C4-T1 ACDF) 09/12/2020   Neuropathic pain of hand, left 09/12/2020   Chronic pain syndrome 09/12/2020   Failed neck syndrome 07/30/2020   Left hand pain 07/30/2020   Weakness 05/01/2020   Grief reaction 11/17/2019    Cervical stenosis of spinal canal 11/17/2019   Hearing loss 11/17/2019   Poor balance 06/12/2019   Screening mammogram, encounter for 05/23/2018   Substernal chest pain 08/16/2017   Atherosclerosis of aorta ([place]) 03/09/2017   Weight loss 02/23/2017   Routine general medical examination at a health care facility 07/05/2015   Estrogen deficiency 07/05/2015   Prediabetes 01/01/2015   Lumbar spinal stenosis 11/13/2014   History of vertigo 10/05/2014   Leukocytosis 07/16/2014   Risk for falls 06/27/2014   Low back pain on right side with sciatica 07/24/2013   Edema 04/26/2013   Encounter for Medicare annual wellness exam 04/11/2013   Lymphocytosis 01/05/2012   GERD (gastroesophageal reflux disease)    Osteopenia 01/30/2009   Constipation 11/01/2007   Depression with anxiety 02/14/2007   Hyperlipidemia LDL goal <130 02/04/2007   Gout 02/04/2007   Essential hypertension 02/04/2007   DIVERTICULOSIS, COLON 02/04/2007   Stage 3b chronic kidney disease (CKD) ([place]) 02/04/2007   OSTEOARTHRITIS 02/04/2007   DEGENERATIVE [place] DISEASE 02/04/2007   Past Medical History:  Diagnosis Date   Ankylosis of lumbar spine    Basal cell carcinoma    removed   Cervical spine pain    Chest pain    a.  02/2017 MV: EF 54%, small, distal anterior wall/apical infacrt (likely misregistration), no ischemia-->Low risk; b. 02/2017 Echo: EF 55-60%, mild MR, PASP 81mHg.   Chronic back pain    stenosis   Constipation    takes Colace daily   Depression    takes Zoloft daily   Diverticulosis of colon    GERD (gastroesophageal reflux disease)    atypical chest pain   Gout    hx of;was on Allopurinol but taken off 6 wks ago by medical MD   History of diverticulitis of colon    HLD (hyperlipidemia)    takes Pravastatin daily   HTN (hypertension)    takes Amlodipine and carvedilol   Lumbar spondylosis with myelopathy    Numbness    OA (osteoarthritis)    right knee   Osteopenia    takes Calcium and Vit D  daily   Scoliosis    Vertigo    takes Antivert daily as needed   Past Surgical History:  Procedure Laterality Date   [place] 89  [place]   CHOLECYSTECTOMY  1970   COLONOSCOPY     KNEE SURGERY Right    Medial meniscus tear   LUMBAR LAMINECTOMY/DECOMPRESSION MICRODISCECTOMY Bilateral 11/13/2014   Procedure: Lumbar one-two Bilateral laminectomy and foraminotomy, Left Lumbar one-two microdiscectomy ;  Surgeon: RFaythe Ghee MD;  Location: [facility];  Service: Neurosurgery;  Laterality: Bilateral;   NECK SURGERY  [id number]  x 3; fusions   PARTIAL COLECTOMY  09/1999   d/t diverticulitis   TUBAL LIGATION     Social History   Tobacco Use   Smoking status: Never   Smokeless tobacco: Never  Vaping Use   Vaping Use: Never used  Substance Use Topics   Alcohol use: Never    Alcohol/week: 0.0 standard drinks of alcohol   Drug use: Never   Family History  Problem Relation Age of Onset   Cancer Father        throat and bladder   Diabetes Father    Hypertension Father    Heart attack Father    Stroke Mother    Alzheimer's disease Mother    Hypertension Mother    Allergies  Allergen Reactions   Ace Inhibitors     REACTION: increased K+, increased creat.   Bactrim [Sulfamethoxazole-Trimethoprim]     rash   Cephalosporins     REACTION: reaction not known   Colesevelam     REACTION: constipation   Hctz [Hydrochlorothiazide]     Makes her feel "bad"   Losartan     Increased K   Lovastatin     REACTION: doesn't work   Lyrica [Pregabalin]     Felt bad    Raloxifene     REACTION: cramps   Rosuvastatin     REACTION: myalgia   Simvastatin     REACTION: myalgia   Current Outpatient Medications on File Prior to Visit  Medication Sig Dispense Refill   amLODipine (NORVASC) 10 MG tablet TAKE 1 TABLET DAILY 90 tablet 1   aspirin EC 81 MG tablet Take 1 tablet (81 mg total) by mouth daily. 90 tablet 3   Calcium  Carbonate-Vitamin D 500-125 MG-UNIT TABS Take by mouth.     Cranberry 250 MG CAPS Take 1 capsule by mouth daily.     docusate sodium (COLACE) 100 MG capsule Take 1-2 capsules (100-200 mg total) by mouth 2 (two) times daily. 100 mg in  the morning and 200 mg at night 180 capsule 1   famotidine (PEPCID) 20 MG tablet TAKE 1 TABLET TWICE A DAY 180 tablet 3   fish oil-omega-3 fatty acids 1000 MG capsule Take 1 g by mouth at bedtime.     hydrALAZINE (APRESOLINE) 10 MG tablet TAKE 1 TABLET IN THE       MORNING AND AT BEDTIME 180 tablet 2   meclizine (ANTIVERT) 25 MG tablet Take 25 mg by mouth 4 (four) times daily as needed for dizziness.     pravastatin (PRAVACHOL) 20 MG tablet TAKE 1 TABLET DAILY 90 tablet 1   Tetrahydrozoline HCl (EYE DROPS OP) Apply 1-2 drops to eye as needed (for dry eye).     No current facility-administered medications on file prior to visit.     Review of Systems  Constitutional:  Positive for fatigue. Negative for activity change, appetite change, fever and unexpected weight change.  HENT:  Negative for congestion, ear pain, rhinorrhea, sinus pressure and sore throat.   Eyes:  Negative for pain, redness and visual disturbance.  Respiratory:  Negative for cough, shortness of breath and wheezing.   Cardiovascular:  Negative for chest pain and palpitations.  Gastrointestinal:  Negative for abdominal pain, blood in stool, constipation and diarrhea.  Endocrine: Negative for polydipsia and polyuria.  Genitourinary:  Negative for dysuria, frequency and urgency.  Musculoskeletal:  Negative for arthralgias, back pain and myalgias.  Skin:  Negative for pallor and rash.  Allergic/Immunologic: Negative for environmental allergies.  Neurological:  Negative for dizziness, syncope and headaches.  Hematological:  Negative for adenopathy. Does not bruise/bleed easily.  Psychiatric/Behavioral:  Positive for dysphoric mood and sleep disturbance. Negative for agitation, confusion, decreased  concentration and suicidal ideas. The patient is nervous/anxious.        Objective:   Physical Exam Constitutional:      General: She is not in acute distress.    Appearance: Normal appearance. She is well-developed and normal weight. She is not ill-appearing.  HENT:     Head: Normocephalic and atraumatic.  Eyes:     Conjunctiva/sclera: Conjunctivae normal.     Pupils: Pupils are equal, round, and reactive to light.  Neck:     Thyroid: No thyromegaly.     Vascular: No carotid bruit or JVD.  Cardiovascular:     Rate and Rhythm: Normal rate and regular rhythm.     Heart sounds: Normal heart sounds.     No gallop.  Pulmonary:     Effort: Pulmonary effort is normal. No respiratory distress.     Breath sounds: Normal breath sounds. No wheezing or rales.  Abdominal:     General: There is no distension or abdominal bruit.     Palpations: Abdomen is soft.  Musculoskeletal:     Cervical back: Normal range of motion and neck supple.     Right lower leg: No edema.     Left lower leg: No edema.  Lymphadenopathy:     Cervical: No cervical adenopathy.  Skin:    General: Skin is warm and dry.     Coloration: Skin is not pale.     Findings: No rash.  Neurological:     Mental Status: She is alert.     Coordination: Coordination normal.     Deep Tendon Reflexes: Reflexes are normal and symmetric. Reflexes normal.  Psychiatric:        Attention and Perception: Attention normal.        Mood and Affect: Mood is anxious  and depressed. Affect is not tearful.        Speech: Speech normal.        Behavior: Behavior normal.        Thought Content: Thought content is not paranoid. Thought content does not include suicidal ideation.        Cognition and Memory: Memory normal.     Comments: Candidly discusses symptoms and stressors             Assessment & Plan:   Problem List Items Addressed This Visit       Cardiovascular and Mediastinum   Essential hypertension (Chronic)    bp  in fair control at this time / stable   (better on 2nd check when more calm) BP Readings from Last 1 Encounters:  10/29/22 124/61  intol of many medications  No changes needed Most recent labs reviewed  Disc lifstyle change with low sodium diet and exercise  Plan to continue amlodipie '10mg'$  daily  Hydralazine 10 mg bid          Other   Depression with anxiety - Primary    Some improvement since getting back on zoloft  Pt desires inc in dose- we can go up to max of 100 mg bid  She has not had any side eff or low sodium Reviewed stressors/ coping techniques/symptoms/ support sources/ tx options and side effects in detail today Disc stressors and worries in detail today  Very lonely but does reach out to people Feels isolated Is very pre occupied with her grandchildren and problems they have  I strongly enc her to continue counseling (would have to be at home)  She is open to that Referral done If [facility] does now work out with insurance may have to look elsewhere  No SI  Will f/u 3 mo for annual exam or earlier if needed       Relevant Medications   sertraline (ZOLOFT) 100 MG tablet   Other Relevant Orders   Ambulatory referral to Psychology

## 2022-10-30 ENCOUNTER — Telehealth: Payer: Self-pay | Admitting: Family Medicine

## 2022-10-30 NOTE — Telephone Encounter (Signed)
Patient called and stated needs telephone treatment and need PA for insurance.

## 2022-10-30 NOTE — Telephone Encounter (Signed)
She discuss this with referral dpt regarding referral, will route to that dpt.

## 2022-10-30 NOTE — Telephone Encounter (Signed)
Per referral notes,   [facility]:  Linda Robinson  10/30/22   09:23:46 AM  Comments   Called patient on 2-2, patient states if insurance does not cover 100% she can not proceed. Patient ask for a call back on 2-5 after inquiring w/ insurance.    It sounds like her insurance is requiring Referral Authorization. I will work on this to see what I can find out.

## 2022-11-09 ENCOUNTER — Telehealth: Payer: Self-pay | Admitting: Family Medicine

## 2022-11-09 NOTE — Telephone Encounter (Signed)
So glad to hear that I still think counseling in the future would help in the long run so let me know if /when she becomes interested in the future

## 2022-11-09 NOTE — Telephone Encounter (Signed)
Patient called in to let Dr Glori Bickers know that now that she is doing much better now that she has doubled hersertraline (ZOLOFT) 100 MG tablet  medication.She received the information for behavioral health but she has decide to hold off on seeing them.

## 2022-11-24 ENCOUNTER — Other Ambulatory Visit: Payer: Self-pay | Admitting: Family Medicine

## 2022-12-17 NOTE — Telephone Encounter (Signed)
Patient declined referral    Linda Robinson  11/11/2407:21:51 AM  Note: closing referral    Spoke with your patient on 2-13, patient states she was prescribed new medication that "seems to be working" and does not need an appointment, therefore we are closing the referral.    If your patient contacts [place], we will reopen the referral and get them scheduled in our next opening.

## 2022-12-29 ENCOUNTER — Telehealth: Payer: Self-pay | Admitting: Family Medicine

## 2022-12-29 NOTE — Telephone Encounter (Signed)
Contacted Linda Robinson to schedule their annual wellness visit. Appointment made for 01/05/2023.  [place] Direct Dial: [phone]

## 2022-12-30 DIAGNOSIS — H52223 Regular astigmatism, bilateral: Secondary | ICD-10-CM | POA: Diagnosis not present

## 2022-12-30 DIAGNOSIS — Z0100 Encounter for examination of eyes and vision without abnormal findings: Secondary | ICD-10-CM | POA: Diagnosis not present

## 2023-01-05 ENCOUNTER — Ambulatory Visit (INDEPENDENT_AMBULATORY_CARE_PROVIDER_SITE_OTHER): Payer: Medicare HMO

## 2023-01-05 VITALS — Ht 60.0 in | Wt 132.0 lb

## 2023-01-05 DIAGNOSIS — Z0000 Encounter for general adult medical examination without abnormal findings: Secondary | ICD-10-CM | POA: Diagnosis not present

## 2023-01-05 NOTE — Patient Instructions (Signed)
[name] , Thank you for taking time to come for your Medicare Wellness Visit. I appreciate your ongoing commitment to your health goals. Please review the following plan we discussed and let me know if I can assist you in the future.   These are the goals we discussed:  Goals      Increase water intake     Starting 05/16/2018, I will continue to drink at least 6-8 glasses of water daily.      Patient Stated     05/26/2019, wants to stop urinary leakage     Patient Stated     06/05/2020, I will maintain and continue medications as prescribed.      Patient Stated     No new goals        This is a list of the screening recommended for you and due dates:  Health Maintenance  Topic Date Due   Zoster (Shingles) Vaccine (1 of 2) Never done   COVID-19 Vaccine (3 - Moderna risk series) 02/18/2020   DTaP/Tdap/Td vaccine (3 - Tdap) 02/09/2022   Mammogram  06/12/2030*   Flu Shot  04/29/2023   Medicare Annual Wellness Visit  01/05/2024   Pneumonia Vaccine  Completed   DEXA scan (bone density measurement)  Completed   HPV Vaccine  Aged Out  *Topic was postponed. The date shown is not the original due date.    Advanced directives: Advance directive discussed with you today. Even though you declined this today, please call our office should you change your mind, and we can give you the proper paperwork for you to fill out.   Conditions/risks identified: Aim for 30 minutes of exercise or brisk walking, 6-8 glasses of water, and 5 servings of fruits and vegetables each day.   Next appointment: Follow up in one year for your annual wellness visit 01/06/24 @ 2:00 televisit   Preventive Care 65 Years and Older, Female Preventive care refers to lifestyle choices and visits with your health care provider that can promote health and wellness. What does preventive care include? A yearly physical exam. This is also called an annual well check. Dental exams once or twice a year. Routine eye  exams. Ask your health care provider how often you should have your eyes checked. Personal lifestyle choices, including: Daily care of your teeth and gums. Regular physical activity. Eating a healthy diet. Avoiding tobacco and drug use. Limiting alcohol use. Practicing safe sex. Taking low-dose aspirin every day. Taking vitamin and mineral supplements as recommended by your health care provider. What happens during an annual well check? The services and screenings done by your health care provider during your annual well check will depend on your age, overall health, lifestyle risk factors, and family history of disease. Counseling  Your health care provider may ask you questions about your: Alcohol use. Tobacco use. Drug use. Emotional well-being. Home and relationship well-being. Sexual activity. Eating habits. History of falls. Memory and ability to understand (cognition). Work and work Astronomer. Reproductive health. Screening  You may have the following tests or measurements: Height, weight, and BMI. Blood pressure. Lipid and cholesterol levels. These may be checked every 5 years, or more frequently if you are over 84 years old. Skin check. Lung cancer screening. You may have this screening every year starting at age 76 if you have a 30-pack-year history of smoking and currently smoke or have quit within the past 15 years. Fecal occult blood test (FOBT) of the stool. You may have this  test every year starting at age 73. Flexible sigmoidoscopy or colonoscopy. You may have a sigmoidoscopy every 5 years or a colonoscopy every 10 years starting at age 32. Hepatitis C blood test. Hepatitis B blood test. Sexually transmitted disease (STD) testing. Diabetes screening. This is done by checking your blood sugar (glucose) after you have not eaten for a while (fasting). You may have this done every 1-3 years. Bone density scan. This is done to screen for osteoporosis. You may have  this done starting at age 26. Mammogram. This may be done every 1-2 years. Talk to your health care provider about how often you should have regular mammograms. Talk with your health care provider about your test results, treatment options, and if necessary, the need for more tests. Vaccines  Your health care provider may recommend certain vaccines, such as: Influenza vaccine. This is recommended every year. Tetanus, diphtheria, and acellular pertussis (Tdap, Td) vaccine. You may need a Td booster every 10 years. Zoster vaccine. You may need this after age 40. Pneumococcal 13-valent conjugate (PCV13) vaccine. One dose is recommended after age 43. Pneumococcal polysaccharide (PPSV23) vaccine. One dose is recommended after age 75. Talk to your health care provider about which screenings and vaccines you need and how often you need them. This information is not intended to replace advice given to you by your health care provider. Make sure you discuss any questions you have with your health care provider. Document Released: 10/11/2015 Document Revised: 06/03/2016 Document Reviewed: 07/16/2015 Elsevier Interactive Patient Education  2017 [place] Prevention in the Home Falls can cause injuries. They can happen to people of all ages. There are many things you can do to make your home safe and to help prevent falls. What can I do on the outside of my home? Regularly fix the edges of walkways and driveways and fix any cracks. Remove anything that might make you trip as you walk through a door, such as a raised step or threshold. Trim any bushes or trees on the path to your home. Use bright outdoor lighting. Clear any walking paths of anything that might make someone trip, such as rocks or tools. Regularly check to see if handrails are loose or broken. Make sure that both sides of any steps have handrails. Any raised decks and porches should have guardrails on the edges. Have any leaves,  snow, or ice cleared regularly. Use sand or salt on walking paths during winter. Clean up any spills in your garage right away. This includes oil or grease spills. What can I do in the bathroom? Use night lights. Install grab bars by the toilet and in the tub and shower. Do not use towel bars as grab bars. Use non-skid mats or decals in the tub or shower. If you need to sit down in the shower, use a plastic, non-slip stool. Keep the floor dry. Clean up any water that spills on the floor as soon as it happens. Remove soap buildup in the tub or shower regularly. Attach bath mats securely with double-sided non-slip rug tape. Do not have throw rugs and other things on the floor that can make you trip. What can I do in the bedroom? Use night lights. Make sure that you have a light by your bed that is easy to reach. Do not use any sheets or blankets that are too big for your bed. They should not hang down onto the floor. Have a firm chair that has side arms. You can  use this for support while you get dressed. Do not have throw rugs and other things on the floor that can make you trip. What can I do in the kitchen? Clean up any spills right away. Avoid walking on wet floors. Keep items that you use a lot in easy-to-reach places. If you need to reach something above you, use a strong step stool that has a grab bar. Keep electrical cords out of the way. Do not use floor polish or wax that makes floors slippery. If you must use wax, use non-skid floor wax. Do not have throw rugs and other things on the floor that can make you trip. What can I do with my stairs? Do not leave any items on the stairs. Make sure that there are handrails on both sides of the stairs and use them. Fix handrails that are broken or loose. Make sure that handrails are as long as the stairways. Check any carpeting to make sure that it is firmly attached to the stairs. Fix any carpet that is loose or worn. Avoid having throw  rugs at the top or bottom of the stairs. If you do have throw rugs, attach them to the floor with carpet tape. Make sure that you have a light switch at the top of the stairs and the bottom of the stairs. If you do not have them, ask someone to add them for you. What else can I do to help prevent falls? Wear shoes that: Do not have high heels. Have rubber bottoms. Are comfortable and fit you well. Are closed at the toe. Do not wear sandals. If you use a stepladder: Make sure that it is fully opened. Do not climb a closed stepladder. Make sure that both sides of the stepladder are locked into place. Ask someone to hold it for you, if possible. Clearly mark and make sure that you can see: Any grab bars or handrails. First and last steps. Where the edge of each step is. Use tools that help you move around (mobility aids) if they are needed. These include: Canes. Walkers. Scooters. Crutches. Turn on the lights when you go into a dark area. Replace any light bulbs as soon as they burn out. Set up your furniture so you have a clear path. Avoid moving your furniture around. If any of your floors are uneven, fix them. If there are any pets around you, be aware of where they are. Review your medicines with your doctor. Some medicines can make you feel dizzy. This can increase your chance of falling. Ask your doctor what other things that you can do to help prevent falls. This information is not intended to replace advice given to you by your health care provider. Make sure you discuss any questions you have with your health care provider. Document Released: 07/11/2009 Document Revised: 02/20/2016 Document Reviewed: 10/19/2014 Elsevier Interactive Patient Education  2017 [employer].

## 2023-01-05 NOTE — Progress Notes (Signed)
I connected with  Linda JuneEvelyn A Beets on 01/05/23 by a audio enabled telemedicine application and verified that I am speaking with the correct person using two identifiers.  Patient Location: Home  Provider Location: Office/Clinic  I discussed the limitations of evaluation and management by telemedicine. The patient expressed understanding and agreed to proceed.  Subjective:   Linda Robinson is a 89 y.o. female who presents for Medicare Annual (Subsequent) preventive examination.  Review of Systems      Cardiac Risk Factors include: advanced age (>2755men, 42>65 women);hypertension;sedentary lifestyle     Objective:    Today's Vitals   01/05/23 1401  Weight: 132 lb (59.9 kg)  Height: 5' (1.524 m)   Body mass index is 25.78 kg/m.     01/05/2023    2:10 PM 06/11/2022    3:21 PM 03/05/2022    2:25 PM 02/12/2022    2:52 PM 10/17/2021   12:03 PM 09/12/2020    9:48 AM 06/05/2020   12:05 PM  Advanced Directives  Does Patient Have a Medical Advance Directive? No No Yes Yes Yes No No  Type of Advance Directive     Healthcare Power of Attorney    Would patient like information on creating a medical advance directive? No - Patient declined No - Patient declined    No - Patient declined No - Patient declined    Current Medications (verified) Outpatient Encounter Medications as of 01/05/2023  Medication Sig   amLODipine (NORVASC) 10 MG tablet TAKE 1 TABLET DAILY   aspirin EC 81 MG tablet Take 1 tablet (81 mg total) by mouth daily.   Calcium Carbonate-Vitamin D 500-125 MG-UNIT TABS Take by mouth.   Cranberry 250 MG CAPS Take 1 capsule by mouth daily.   docusate sodium (COLACE) 100 MG capsule Take 1-2 capsules (100-200 mg total) by mouth 2 (two) times daily. 100 mg in the morning and 200 mg at night   famotidine (PEPCID) 20 MG tablet TAKE 1 TABLET TWICE A DAY   fish oil-omega-3 fatty acids 1000 MG capsule Take 1 g by mouth at bedtime.   hydrALAZINE (APRESOLINE) 10 MG tablet TAKE 1 TABLET IN THE        MORNING AND AT BEDTIME   meclizine (ANTIVERT) 25 MG tablet Take 25 mg by mouth 4 (four) times daily as needed for dizziness.   pravastatin (PRAVACHOL) 20 MG tablet TAKE 1 TABLET DAILY   sertraline (ZOLOFT) 100 MG tablet Take 1 tablet (100 mg total) by mouth in the morning and at bedtime.   Tetrahydrozoline HCl (EYE DROPS OP) Apply 1-2 drops to eye as needed (for dry eye).   No facility-administered encounter medications on file as of 01/05/2023.    Allergies (verified) Ace inhibitors, Bactrim [sulfamethoxazole-trimethoprim], Cephalosporins, Colesevelam, Hctz [hydrochlorothiazide], Losartan, Lovastatin, Lyrica [pregabalin], Raloxifene, Rosuvastatin, and Simvastatin   History: Past Medical History:  Diagnosis Date   Ankylosis of lumbar spine    Basal cell carcinoma    removed   Cervical spine pain    Chest pain    a. 02/2017 MV: EF 54%, small, distal anterior wall/apical infacrt (likely misregistration), no ischemia-->Low risk; b. 02/2017 Echo: EF 55-60%, mild MR, PASP 35mmHg.   Chronic back pain    stenosis   Constipation    takes Colace daily   Depression    takes Zoloft daily   Diverticulosis of colon    GERD (gastroesophageal reflux disease)    atypical chest pain   Gout    hx of;was on Allopurinol but  taken off 6 wks ago by medical MD   History of diverticulitis of colon    HLD (hyperlipidemia)    takes Pravastatin daily   HTN (hypertension)    takes Amlodipine and carvedilol   Lumbar spondylosis with myelopathy    Numbness    OA (osteoarthritis)    right knee   Osteopenia    takes Calcium and Vit D daily   Scoliosis    Vertigo    takes Antivert daily as needed   Past Surgical History:  Procedure Laterality Date   APPENDECTOMY  at age 22   BACK SURGERY     CATARACT EXTRACTION Right 04/2006   CHOLECYSTECTOMY  1970   COLONOSCOPY     KNEE SURGERY Right    Medial meniscus tear   LUMBAR LAMINECTOMY/DECOMPRESSION MICRODISCECTOMY Bilateral 11/13/2014   Procedure:  Lumbar one-two Bilateral laminectomy and foraminotomy, Left Lumbar one-two microdiscectomy ;  Surgeon: Reinaldo Meeker, MD;  Location: [facility];  Service: Neurosurgery;  Laterality: Bilateral;   NECK SURGERY  [phone]   x 3; fusions   PARTIAL COLECTOMY  09/1999   d/t diverticulitis   TUBAL LIGATION     Family History  Problem Relation Age of Onset   Cancer Father        throat and bladder   Diabetes Father    Hypertension Father    Heart attack Father    Stroke Mother    Alzheimer's disease Mother    Hypertension Mother    Social History   Socioeconomic History   Marital status: Widowed    Spouse name: Not on file   Number of children: 3   Years of education: 14   Highest education level: Associate degree: occupational, Scientist, product/process development, or vocational program  Occupational History   Occupation: Retired  Tobacco Use   Smoking status: Never   Smokeless tobacco: Never  Vaping Use   Vaping Use: Never used  Substance and Sexual Activity   Alcohol use: Never    Alcohol/week: 0.0 standard drinks of alcohol   Drug use: Never   Sexual activity: Not Currently    Birth control/protection: Post-menopausal  Other Topics Concern   Not on file  Social History Narrative   Lives alone   Coffee 1/2 c   Social Determinants of Health   Financial Resource Strain: Low Risk  (01/05/2023)   Overall Financial Resource Strain (CARDIA)    Difficulty of Paying Living Expenses: Not hard at all  Food Insecurity: No Food Insecurity (01/05/2023)   Hunger Vital Sign    Worried About Running Out of Food in the Last Year: Never true    Ran Out of Food in the Last Year: Never true  Transportation Needs: No Transportation Needs (01/05/2023)   PRAPARE - Administrator, Civil Service (Medical): No    Lack of Transportation (Non-Medical): No  Physical Activity: Inactive (01/05/2023)   Exercise Vital Sign    Days of Exercise per Week: 0 days    Minutes of Exercise per Session: 0 min  Stress: No  Stress Concern Present (01/05/2023)   [employer] of Occupational Health - Occupational Stress Questionnaire    Feeling of Stress : Not at all  Social Connections: Socially Isolated (01/05/2023)   Social Connection and Isolation Panel [NHANES]    Frequency of Communication with Friends and Family: More than three times a week    Frequency of Social Gatherings with Friends and Family: More than three times a week    Attends Religious  Services: Never    Active Member of Clubs or Organizations: No    Attends Banker Meetings: Never    Marital Status: Widowed    Tobacco Counseling Counseling given: Not Answered   Clinical Intake:  Pre-visit preparation completed: Yes  Pain : No/denies pain     Nutritional Risks: None Diabetes: No  How often do you need to have someone help you when you read instructions, pamphlets, or other written materials from your doctor or pharmacy?: 1 - Never  Diabetic? no  Interpreter Needed?: No  Information entered by :: C.McMulen LPN   Activities of Daily Living    01/05/2023    2:12 PM  In your present state of health, do you have any difficulty performing the following activities:  Hearing? 0  Vision? 0  Difficulty concentrating or making decisions? 0  Walking or climbing stairs? 1  Comment uses walker  Dressing or bathing? 0  Doing errands, shopping? 0  Preparing Food and eating ? N  Using the Toilet? N  In the past six months, have you accidently leaked urine? Y  Comment occasional when has to go to the bathroom, wears pad  Do you have problems with loss of bowel control? N  Managing your Medications? N  Managing your Finances? N  Housekeeping or managing your Housekeeping? N    Patient Care Team: Tower, Audrie Gallus, MD as PCP - General Patricia Nettle, MD as Consulting Physician (Orthopedic Surgery)  Indicate any recent Medical Services you may have received from other than [facility] providers in the past year (date may be  approximate).     Assessment:   This is a routine wellness examination for Linda Robinson.  Hearing/Vision screen Hearing Screening - Comments:: No aids Vision Screening - Comments:: Glasses - Richardson  Dietary issues and exercise activities discussed: Current Exercise Habits: The patient does not participate in regular exercise at present, Exercise limited by: None identified   Goals Addressed             This Visit's Progress    Patient Stated       No new goals       Depression Screen    01/05/2023    2:09 PM 10/29/2022   10:55 AM 10/12/2022   11:24 AM 02/03/2022   11:40 AM 10/22/2021   10:26 AM 06/04/2021   10:00 AM 06/05/2020   12:06 PM  PHQ 2/9 Scores  PHQ - 2 Score 0 2 2 5  0 0 0  PHQ- 9 Score  5 8 12  0 0 0    Fall Risk    01/05/2023    2:11 PM 10/29/2022   10:55 AM 10/12/2022   11:24 AM 04/14/2022    9:24 AM 03/10/2022   11:06 AM  Fall Risk   Falls in the past year? 1 1 0 0 1  Number falls in past yr: 0 0 0  1  Injury with Fall? 0 0 0  1  Risk for fall due to : Impaired balance/gait History of fall(s) No Fall Risks    Follow up Falls evaluation completed;Education provided;Falls prevention discussed Falls evaluation completed Falls evaluation completed Falls evaluation completed     FALL RISK PREVENTION PERTAINING TO THE HOME:  Any stairs in or around the home? No  If so, are there any without handrails? No  Home free of loose throw rugs in walkways, pet beds, electrical cords, etc? Yes  Adequate lighting in your home to reduce risk of falls? Yes  ASSISTIVE DEVICES UTILIZED TO PREVENT FALLS:  Life alert? No  Use of a cane, walker or w/c? Yes  Grab bars in the bathroom? Yes  Shower chair or bench in shower? Yes  Elevated toilet seat or a handicapped toilet? Yes    Cognitive Function:    06/05/2020   12:08 PM 05/26/2019   11:28 AM 05/16/2018   10:58 AM 05/11/2017   12:10 PM 07/07/2016    9:30 AM  MMSE - Mini Mental State Exam  Not completed: Refused       Orientation to time  4 5 5 5   Orientation to Place  5 5 5 5   Registration  3 3 3 3   Attention/ Calculation  5 0 0 0  Recall  2 3 3 3   Language- name 2 objects  0 0 0 0  Language- repeat  1 1 1 1   Language- follow 3 step command  0 3 3 3   Language- read & follow direction  0 0 0 0  Write a sentence  0 0 0 0  Copy design  0 0 0 0  Total score  20 20 20 20         01/05/2023    2:14 PM  6CIT Screen  What Year? 0 points  What month? 0 points  What time? 0 points  Count back from 20 0 points  Months in reverse 0 points  Repeat phrase 0 points  Total Score 0 points    Immunizations Immunization History  Administered Date(s) Administered   Fluad Quad(high Dose 65+) 06/22/2022   Influenza Split 07/23/2011, 07/01/2017   Influenza Whole 08/11/2004, 07/08/2007, 07/09/2008, 06/19/2009, 07/08/2010, 07/12/2020   Influenza, High Dose Seasonal PF 07/31/2015, 06/30/2016, 07/01/2017, 06/13/2018, 05/15/2019, 07/27/2021   Influenza-Unspecified 06/28/2013, 08/05/2015   Moderna Sars-Covid-2 Vaccination 12/24/2019, 01/21/2020   PPD Test 10/03/2015   Pneumococcal Conjugate-13 06/27/2014   Pneumococcal Polysaccharide-23 02/10/2012   Td 08/28/1997, 02/10/2012    TDAP status: Due, Education has been provided regarding the importance of this vaccine. Advised may receive this vaccine at local pharmacy or Health Dept. Aware to provide a copy of the vaccination record if obtained from local pharmacy or Health Dept. Verbalized acceptance and understanding.  Flu Vaccine status: Up to date  Pneumococcal vaccine status: Up to date  Covid-19 vaccine status: Information provided on how to obtain vaccines.   Qualifies for Shingles Vaccine? Yes   Zostavax completed No   Shingrix Completed?: No.    Education has been provided regarding the importance of this vaccine. Patient has been advised to call insurance company to determine out of pocket expense if they have not yet received this vaccine. Advised  may also receive vaccine at local pharmacy or Health Dept. Verbalized acceptance and understanding.  Screening Tests Health Maintenance  Topic Date Due   Zoster Vaccines- Shingrix (1 of 2) Never done   COVID-19 Vaccine (3 - Moderna risk series) 02/18/2020   DTaP/Tdap/Td (3 - Tdap) 02/09/2022   MAMMOGRAM  06/12/2030 (Originally 06/09/2019)   INFLUENZA VACCINE  04/29/2023   Medicare Annual Wellness (AWV)  01/05/2024   Pneumonia Vaccine 27+ Years old  Completed   DEXA SCAN  Completed   HPV VACCINES  Aged Out    Health Maintenance  Health Maintenance Due  Topic Date Due   Zoster Vaccines- Shingrix (1 of 2) Never done   COVID-19 Vaccine (3 - Moderna risk series) 02/18/2020   DTaP/Tdap/Td (3 - Tdap) 02/09/2022    Colorectal cancer screening: No longer required.  Mammogram status: No longer required due to age.  Bone Scan - Declined  Lung Cancer Screening: (Low Dose CT Chest recommended if Age 75-80 years, 30 pack-year currently smoking OR have quit w/in 15years.) does not qualify.   Lung Cancer Screening Referral: no  Additional Screening:  Hepatitis C Screening: does not qualify; Completed no  Vision Screening: Recommended annual ophthalmology exams for early detection of glaucoma and other disorders of the eye. Is the patient up to date with their annual eye exam?  Yes  Who is the provider or what is the name of the office in which the patient attends annual eye exams? [facility] If pt is not established with a provider, would they like to be referred to a provider to establish care? No .   Dental Screening: Recommended annual dental exams for proper oral hygiene  Community Resource Referral / Chronic Care Management: CRR required this visit?  No   CCM required this visit?  No      Plan:     I have personally reviewed and noted the following in the patient's chart:   Medical and social history Use of alcohol, tobacco or illicit drugs  Current medications and  supplements including opioid prescriptions. Patient is not currently taking opioid prescriptions. Functional ability and status Nutritional status Physical activity Advanced directives List of other physicians Hospitalizations, surgeries, and ER visits in previous 12 months Vitals Screenings to include cognitive, depression, and falls Referrals and appointments  In addition, I have reviewed and discussed with patient certain preventive protocols, quality metrics, and best practice recommendations. A written personalized care plan for preventive services as well as general preventive health recommendations were provided to patient.     Maryan Puls, LPN   10/03/1094   Nurse Notes: none

## 2023-01-09 ENCOUNTER — Other Ambulatory Visit: Payer: Self-pay | Admitting: Family Medicine

## 2023-01-25 ENCOUNTER — Telehealth: Payer: Self-pay | Admitting: Family Medicine

## 2023-01-25 DIAGNOSIS — I10 Essential (primary) hypertension: Secondary | ICD-10-CM

## 2023-01-25 DIAGNOSIS — R7303 Prediabetes: Secondary | ICD-10-CM

## 2023-01-25 DIAGNOSIS — E785 Hyperlipidemia, unspecified: Secondary | ICD-10-CM

## 2023-01-25 DIAGNOSIS — M1A9XX0 Chronic gout, unspecified, without tophus (tophi): Secondary | ICD-10-CM

## 2023-01-25 NOTE — Telephone Encounter (Signed)
-----   Message from Alvina Chou sent at 01/05/2023 12:44 PM EDT ----- Regarding: Lab orders for Tuesday, 4.30.24 Patient is scheduled for CPX labs, please order future labs, Thanks , Camelia Eng

## 2023-01-26 ENCOUNTER — Other Ambulatory Visit (INDEPENDENT_AMBULATORY_CARE_PROVIDER_SITE_OTHER): Payer: Medicare HMO

## 2023-01-26 DIAGNOSIS — R7303 Prediabetes: Secondary | ICD-10-CM

## 2023-01-26 DIAGNOSIS — M1A9XX0 Chronic gout, unspecified, without tophus (tophi): Secondary | ICD-10-CM | POA: Diagnosis not present

## 2023-01-26 DIAGNOSIS — I10 Essential (primary) hypertension: Secondary | ICD-10-CM | POA: Diagnosis not present

## 2023-01-26 DIAGNOSIS — E785 Hyperlipidemia, unspecified: Secondary | ICD-10-CM

## 2023-01-26 LAB — CBC WITH DIFFERENTIAL/PLATELET
Basophils Absolute: 0 10*3/uL (ref 0.0–0.1)
Basophils Relative: 0.2 % (ref 0.0–3.0)
Eosinophils Absolute: 0.1 10*3/uL (ref 0.0–0.7)
Eosinophils Relative: 1.4 % (ref 0.0–5.0)
HCT: 43.3 % (ref 36.0–46.0)
Hemoglobin: 14.5 g/dL (ref 12.0–15.0)
Lymphocytes Relative: 43.7 % (ref 12.0–46.0)
Lymphs Abs: 4.8 10*3/uL — ABNORMAL HIGH (ref 0.7–4.0)
MCHC: 33.4 g/dL (ref 30.0–36.0)
MCV: 88.9 fl (ref 78.0–100.0)
Monocytes Absolute: 0.8 10*3/uL (ref 0.1–1.0)
Monocytes Relative: 7.3 % (ref 3.0–12.0)
Neutro Abs: 5.2 10*3/uL (ref 1.4–7.7)
Neutrophils Relative %: 47.4 % (ref 43.0–77.0)
Platelets: 280 10*3/uL (ref 150.0–400.0)
RBC: 4.87 Mil/uL (ref 3.87–5.11)
RDW: 13.8 % (ref 11.5–15.5)
WBC: 10.9 10*3/uL — ABNORMAL HIGH (ref 4.0–10.5)

## 2023-01-26 LAB — LIPID PANEL
Cholesterol: 203 mg/dL — ABNORMAL HIGH (ref 0–200)
HDL: 68.5 mg/dL (ref >39.00)
LDL Cholesterol: 119 mg/dL — ABNORMAL HIGH (ref 0–99)
NonHDL: 134.54
Total CHOL/HDL Ratio: 3
Triglycerides: 79 mg/dL (ref 0.0–149.0)
VLDL: 15.8 mg/dL (ref 0.0–40.0)

## 2023-01-26 LAB — COMPREHENSIVE METABOLIC PANEL
ALT: 8 U/L (ref 0–35)
AST: 18 U/L (ref 0–37)
Albumin: 4.3 g/dL (ref 3.5–5.2)
Alkaline Phosphatase: 56 U/L (ref 39–117)
BUN: 21 mg/dL (ref 6–23)
CO2: 32 mEq/L (ref 19–32)
Calcium: 9.5 mg/dL (ref 8.4–10.5)
Chloride: 100 mEq/L (ref 96–112)
Creatinine, Ser: 1.07 mg/dL (ref 0.40–1.20)
GFR: 45.27 mL/min — ABNORMAL LOW (ref >60.00)
Glucose, Bld: 88 mg/dL (ref 70–99)
Potassium: 4.9 mEq/L (ref 3.5–5.1)
Sodium: 141 mEq/L (ref 135–145)
Total Bilirubin: 0.6 mg/dL (ref 0.2–1.2)
Total Protein: 6.5 g/dL (ref 6.0–8.3)

## 2023-01-26 LAB — TSH: TSH: 2.41 u[IU]/mL (ref 0.35–5.50)

## 2023-01-26 LAB — HEMOGLOBIN A1C: Hgb A1c MFr Bld: 5.6 % (ref 4.6–6.5)

## 2023-01-26 LAB — URIC ACID: Uric Acid, Serum: 4.4 mg/dL (ref 2.4–7.0)

## 2023-01-27 ENCOUNTER — Encounter: Payer: Medicare HMO | Admitting: Family Medicine

## 2023-02-02 ENCOUNTER — Encounter: Payer: Self-pay | Admitting: Family Medicine

## 2023-02-02 ENCOUNTER — Ambulatory Visit (INDEPENDENT_AMBULATORY_CARE_PROVIDER_SITE_OTHER): Payer: Medicare HMO | Admitting: Family Medicine

## 2023-02-02 VITALS — BP 141/80 | HR 59 | Temp 97.6°F | Ht 61.25 in | Wt 132.2 lb

## 2023-02-02 DIAGNOSIS — R531 Weakness: Secondary | ICD-10-CM | POA: Diagnosis not present

## 2023-02-02 DIAGNOSIS — R7303 Prediabetes: Secondary | ICD-10-CM | POA: Diagnosis not present

## 2023-02-02 DIAGNOSIS — I700 Atherosclerosis of aorta: Secondary | ICD-10-CM

## 2023-02-02 DIAGNOSIS — M8589 Other specified disorders of bone density and structure, multiple sites: Secondary | ICD-10-CM | POA: Diagnosis not present

## 2023-02-02 DIAGNOSIS — I10 Essential (primary) hypertension: Secondary | ICD-10-CM | POA: Diagnosis not present

## 2023-02-02 DIAGNOSIS — M1A9XX0 Chronic gout, unspecified, without tophus (tophi): Secondary | ICD-10-CM

## 2023-02-02 DIAGNOSIS — F418 Other specified anxiety disorders: Secondary | ICD-10-CM | POA: Diagnosis not present

## 2023-02-02 DIAGNOSIS — Z0000 Encounter for general adult medical examination without abnormal findings: Secondary | ICD-10-CM

## 2023-02-02 DIAGNOSIS — E785 Hyperlipidemia, unspecified: Secondary | ICD-10-CM

## 2023-02-02 DIAGNOSIS — D72820 Lymphocytosis (symptomatic): Secondary | ICD-10-CM | POA: Diagnosis not present

## 2023-02-02 DIAGNOSIS — N1832 Chronic kidney disease, stage 3b: Secondary | ICD-10-CM | POA: Diagnosis not present

## 2023-02-02 NOTE — Progress Notes (Signed)
Subjective:    Patient ID: Linda Robinson, female    DOB: 17-Oct-1928, 89 y.o.   MRN: [id number]  HPI Here for health maintenance exam and to review chronic medical problems    Wt Readings from Last 3 Encounters:  02/02/23 132 lb 4 oz (60 kg)  01/05/23 132 lb (59.9 kg)  10/29/22 133 lb (60.3 kg)   24.78 kg/m Vitals:   02/02/23 1132  BP: (!) 158/96  Pulse: (!) 59  Temp: 97.6 F (36.4 C)  SpO2: 98%   Not doing much these days Tries to take care of herself    Immunization History  Administered Date(s) Administered   Fluad Quad(high Dose 65+) 06/22/2022   Influenza Split 07/23/2011, 07/01/2017   Influenza Whole 08/11/2004, 07/08/2007, 07/09/2008, 06/19/2009, 07/08/2010, 07/12/2020   Influenza, High Dose Seasonal PF 07/31/2015, 06/30/2016, 07/01/2017, 06/13/2018, 05/15/2019, 07/27/2021   Influenza-Unspecified 06/28/2013, 08/05/2015   Moderna Sars-Covid-2 Vaccination 12/24/2019, 01/21/2020   PPD Test 10/03/2015   Pneumococcal Conjugate-13 06/27/2014   Pneumococcal Polysaccharide-23 02/10/2012   Td 08/28/1997, 02/10/2012   Health Maintenance Due  Topic Date Due   DTaP/Tdap/Td (3 - Tdap) 02/09/2022   Declines shigrix in past- may want to get in the future  Tetanus shot   Mammogram -declines due to age Self breast exam -no lumps   Out aged colon screening   Dexa  10/16 osteopenia (declines another dexa) Falls- not since last visit  Fractures-none  Supplements -takes vit D and ca  Exercise : not much due to chronic knee and back pain  Walks a bit with cane and walker   H/o chronic weak spells - have worked up ? Due to anx or medicine  Last weak spell was better after eating pie / sugar helps   Mood- anxiety  Takes zoloft 100 mg bid      02/02/2023   11:37 AM 01/05/2023    2:09 PM 10/29/2022   10:55 AM 10/12/2022   11:24 AM 02/03/2022   11:40 AM  Depression screen PHQ 2/9  Decreased Interest 0 0 1 2 3   Down, Depressed, Hopeless 1 0 1 0 2  PHQ - 2 Score 1 0 2 2  5   Altered sleeping 0  0 0 1  Tired, decreased energy 1  3 3 3   Change in appetite 1  0 0   Feeling bad or failure about yourself  0  0 0 0  Trouble concentrating 0  0 0 0  Moving slowly or fidgety/restless 3  0 3 3  Suicidal thoughts 0  0 0 0  PHQ-9 Score 6  5 8 12   Difficult doing work/chores Not difficult at all  Not difficult at all  Somewhat difficult     Some stress- grandchild had a 5 lb baby She worries   HTN bp is stable today  No cp or palpitations or headaches or edema  No side effects to medicines  BP Readings from Last 3 Encounters:  02/02/23 (!) 141/80  10/29/22 124/61  10/12/22 136/68     Amlodipine 10 mg daily  Hydralazine 10 mg bid  Last metabolic panel Lab Results  Component Value Date   GLUCOSE 88 01/26/2023   NA 141 01/26/2023   K 4.9 01/26/2023   CL 100 01/26/2023   CO2 32 01/26/2023   BUN 21 01/26/2023   CREATININE 1.07 01/26/2023   GFRNONAA 41 (L) 06/11/2022   CALCIUM 9.5 01/26/2023   PHOS 3.0 09/15/2013   PROT 6.5 01/26/2023   ALBUMIN 4.3  01/26/2023   BILITOT 0.6 01/26/2023   ALKPHOS 56 01/26/2023   AST 18 01/26/2023   ALT 8 01/26/2023   ANIONGAP 8 06/11/2022   GFR 45.27  Lymphocytosis Lab Results  Component Value Date   WBC 10.9 (H) 01/26/2023   HGB 14.5 01/26/2023   HCT 43.3 01/26/2023   MCV 88.9 01/26/2023   PLT 280.0 01/26/2023      Hyperlipidemia Lab Results  Component Value Date   CHOL 203 (H) 01/26/2023   CHOL 224 (H) 06/04/2021   CHOL 199 06/05/2020   Lab Results  Component Value Date   HDL 68.50 01/26/2023   HDL 66.80 06/04/2021   HDL 68.60 06/05/2020   Lab Results  Component Value Date   LDLCALC 119 (H) 01/26/2023   LDLCALC 130 (H) 06/04/2021   LDLCALC 108 (H) 06/05/2020   Lab Results  Component Value Date   TRIG 79.0 01/26/2023   TRIG 139.0 06/04/2021   TRIG 114.0 06/05/2020   Lab Results  Component Value Date   CHOLHDL 3 01/26/2023   CHOLHDL 3 06/04/2021   CHOLHDL 3 06/05/2020   Lab  Results  Component Value Date   LDLDIRECT 135.4 04/11/2013   LDLDIRECT 134.0 02/03/2012   LDLDIRECT 150.8 10/30/2010   Pravastatin 20 mg daily -this is what she can tolerate   Gout Lab Results  Component Value Date   LABURIC 4.4 01/26/2023  No flares  Prediabetes Lab Results  Component Value Date   HGBA1C 5.6 01/26/2023     Patient Active Problem List   Diagnosis Date Noted   Knee pain 10/12/2022   Bladder pain 07/03/2022   Falls frequently 03/10/2022   Osteoarthritis of both knees 10/29/2021   Lower abdominal pain 07/23/2021   Diverticulosis 07/23/2021   Cervical radicular pain (left) 09/12/2020   Cervical disc disorder with radiculopathy of cervical region 09/12/2020   Cervical spondylosis 09/12/2020   Cervical fusion syndrome (C4-T1 ACDF) 09/12/2020   Neuropathic pain of hand, left 09/12/2020   Chronic pain syndrome 09/12/2020   Failed neck syndrome 07/30/2020   Left hand pain 07/30/2020   Weakness 05/01/2020   Grief reaction 11/17/2019   Cervical stenosis of spinal canal 11/17/2019   Hearing loss 11/17/2019   Poor balance 06/12/2019   Screening mammogram, encounter for 05/23/2018   Substernal chest pain 08/16/2017   Atherosclerosis of aorta (HCC) 03/09/2017   Weight loss 02/23/2017   Routine general medical examination at a health care facility 07/05/2015   Estrogen deficiency 07/05/2015   Prediabetes 01/01/2015   Lumbar spinal stenosis 11/13/2014   History of vertigo 10/05/2014   Leukocytosis 07/16/2014   Risk for falls 06/27/2014   Low back pain on right side with sciatica 07/24/2013   Edema 04/26/2013   Encounter for Medicare annual wellness exam 04/11/2013   Lymphocytosis 01/05/2012   GERD (gastroesophageal reflux disease)    Osteopenia 01/30/2009   Constipation 11/01/2007   Depression with anxiety 02/14/2007   Hyperlipidemia LDL goal <130 02/04/2007   Gout 02/04/2007   Essential hypertension 02/04/2007   DIVERTICULOSIS, COLON 02/04/2007   Stage  3b chronic kidney disease (CKD) (HCC) 02/04/2007   OSTEOARTHRITIS 02/04/2007   DEGENERATIVE DISC DISEASE 02/04/2007   Past Medical History:  Diagnosis Date   Ankylosis of lumbar spine    Basal cell carcinoma    removed   Cervical spine pain    Chest pain    a. 02/2017 MV: EF 54%, small, distal anterior wall/apical infacrt (likely misregistration), no ischemia-->Low risk; b. 02/2017 Echo: EF 55-60%, mild  MR, PASP .   Chronic back pain    stenosis   Constipation    takes Colace daily   Depression    takes Zoloft daily   Diverticulosis of colon    GERD (gastroesophageal reflux disease)    atypical chest pain   Gout    hx of;was on Allopurinol but taken off 6 wks ago by medical MD   History of diverticulitis of colon    HLD (hyperlipidemia)    takes Pravastatin daily   HTN (hypertension)    takes Amlodipine and carvedilol   Lumbar spondylosis with myelopathy    Numbness    OA (osteoarthritis)    right knee   Osteopenia    takes Calcium and Vit D daily   Scoliosis    Vertigo    takes Antivert daily as needed   Past Surgical History:  Procedure Laterality Date   APPENDECTOMY  at age 65   BACK SURGERY     CATARACT EXTRACTION Right 04/2006   CHOLECYSTECTOMY  1970   COLONOSCOPY     KNEE SURGERY Right    Medial meniscus tear   LUMBAR LAMINECTOMY/DECOMPRESSION MICRODISCECTOMY Bilateral 11/13/2014   Procedure: Lumbar one-two Bilateral laminectomy and foraminotomy, Left Lumbar one-two microdiscectomy ;  Surgeon: Reinaldo Meeker, MD;  Location: [facility];  Service: Neurosurgery;  Laterality: Bilateral;   NECK SURGERY  [phone]   x 3; fusions   PARTIAL COLECTOMY  09/1999   d/t diverticulitis   TUBAL LIGATION     Social History   Tobacco Use   Smoking status: Never   Smokeless tobacco: Never  Vaping Use   Vaping Use: Never used  Substance Use Topics   Alcohol use: Never    Alcohol/week: 0.0 standard drinks of alcohol   Drug use: Never   Family History  Problem  Relation Age of Onset   Cancer Father        throat and bladder   Diabetes Father    Hypertension Father    Heart attack Father    Stroke Mother    Alzheimer's disease Mother    Hypertension Mother    Allergies  Allergen Reactions   Ace Inhibitors     REACTION: increased K+, increased creat.   Bactrim [Sulfamethoxazole-Trimethoprim]     rash   Cephalosporins     REACTION: reaction not known   Colesevelam     REACTION: constipation   Hctz [Hydrochlorothiazide]     Makes her feel "bad"   Losartan     Increased K   Lovastatin     REACTION: doesn't work   Lyrica [Pregabalin]     Felt bad    Raloxifene     REACTION: cramps   Rosuvastatin     REACTION: myalgia   Simvastatin     REACTION: myalgia   Current Outpatient Medications on File Prior to Visit  Medication Sig Dispense Refill   amLODipine (NORVASC) 10 MG tablet TAKE 1 TABLET DAILY 90 tablet 0   aspirin EC 81 MG tablet Take 1 tablet (81 mg total) by mouth daily. 90 tablet 3   Calcium Carbonate-Vitamin D 500-125 MG-UNIT TABS Take by mouth.     Cranberry 250 MG CAPS Take 1 capsule by mouth daily.     docusate sodium (COLACE) 100 MG capsule Take 1-2 capsules (100-200 mg total) by mouth 2 (two) times daily. 100 mg in the morning and 200 mg at night 180 capsule 1   famotidine (PEPCID) 20 MG tablet TAKE 1 TABLET TWICE  A DAY 180 tablet 3   fish oil-omega-3 fatty acids 1000 MG capsule Take 1 g by mouth at bedtime.     hydrALAZINE (APRESOLINE) 10 MG tablet Take 1 tablet (10 mg total) by mouth in the morning and at bedtime. 180 tablet 0   meclizine (ANTIVERT) 25 MG tablet Take 25 mg by mouth 4 (four) times daily as needed for dizziness.     pravastatin (PRAVACHOL) 20 MG tablet TAKE 1 TABLET DAILY 90 tablet 0   sertraline (ZOLOFT) 100 MG tablet Take 1 tablet (100 mg total) by mouth in the morning and at bedtime. 180 tablet 3   Tetrahydrozoline HCl (EYE DROPS OP) Apply 1-2 drops to eye as needed (for dry eye).     No current  facility-administered medications on file prior to visit.     Review of Systems  Constitutional:  Positive for fatigue. Negative for activity change, appetite change, fever and unexpected weight change.  HENT:  Negative for congestion, ear pain, rhinorrhea, sinus pressure and sore throat.   Eyes:  Negative for pain, redness and visual disturbance.  Respiratory:  Negative for cough, shortness of breath and wheezing.   Cardiovascular:  Negative for chest pain and palpitations.  Gastrointestinal:  Negative for abdominal pain, blood in stool, constipation and diarrhea.  Endocrine: Negative for polydipsia and polyuria.  Genitourinary:  Negative for dysuria, frequency and urgency.  Musculoskeletal:  Negative for arthralgias, back pain and myalgias.  Skin:  Negative for pallor and rash.  Allergic/Immunologic: Negative for environmental allergies.  Neurological:  Positive for weakness. Negative for dizziness, syncope and headaches.       Occ spells of overall weakness  L hand weakness is baseline  Hematological:  Negative for adenopathy. Does not bruise/bleed easily.  Psychiatric/Behavioral:  Negative for decreased concentration and dysphoric mood. The patient is not nervous/anxious.        Objective:   Physical Exam Constitutional:      General: She is not in acute distress.    Appearance: Normal appearance. She is well-developed and normal weight. She is not ill-appearing or diaphoretic.  HENT:     Head: Normocephalic and atraumatic.     Right Ear: Tympanic membrane, ear canal and external ear normal.     Left Ear: Tympanic membrane, ear canal and external ear normal.     Ears:     Comments: Very HOH    Nose: Nose normal. No congestion.     Mouth/Throat:     Mouth: Mucous membranes are moist.     Pharynx: Oropharynx is clear. No posterior oropharyngeal erythema.  Eyes:     General: No scleral icterus.    Extraocular Movements: Extraocular movements intact.     Conjunctiva/sclera:  Conjunctivae normal.     Pupils: Pupils are equal, round, and reactive to light.  Neck:     Thyroid: No thyromegaly.     Vascular: No carotid bruit or JVD.  Cardiovascular:     Rate and Rhythm: Normal rate and regular rhythm.     Pulses: Normal pulses.     Heart sounds: Normal heart sounds.     No gallop.  Pulmonary:     Effort: Pulmonary effort is normal. No respiratory distress.     Breath sounds: Normal breath sounds. No wheezing.     Comments: Good air exch Chest:     Chest wall: No tenderness.  Abdominal:     General: Bowel sounds are normal. There is no distension or abdominal bruit.  Palpations: Abdomen is soft. There is no mass.     Tenderness: There is no abdominal tenderness.     Hernia: No hernia is present.  Genitourinary:    Comments: Declines breast exam   Musculoskeletal:        General: No tenderness. Normal range of motion.     Cervical back: Normal range of motion and neck supple. No rigidity. No muscular tenderness.     Right lower leg: No edema.     Left lower leg: No edema.     Comments: mild kyphosis   Atrophic change of L hand baseline with weakness    Lymphadenopathy:     Cervical: No cervical adenopathy.  Skin:    General: Skin is warm and dry.     Coloration: Skin is not pale.     Findings: No erythema or rash.  Neurological:     Mental Status: She is alert. Mental status is at baseline.     Cranial Nerves: No cranial nerve deficit.     Motor: No abnormal muscle tone.     Coordination: Coordination normal.     Gait: Gait normal.     Deep Tendon Reflexes: Reflexes are normal and symmetric. Reflexes normal.     Comments: L hand baseline weak  Psychiatric:        Mood and Affect: Mood is anxious.        Cognition and Memory: Cognition and memory normal.     Comments: Candidly discusses symptoms and stressors             Assessment & Plan:   Problem List Items Addressed This Visit       Cardiovascular and Mediastinum    Essential hypertension (Chronic)    bp in fair control at this time / stable   (better on 2nd check when more calm) BP Readings from Last 1 Encounters:  02/02/23 (!) 141/80  intol of many medications  No changes needed Most recent labs reviewed  Disc lifstyle change with low sodium diet and exercise  Plan to continue amlodipie 10mg  daily  Hydralazine 10 mg bid        Atherosclerosis of aorta (HCC)    Stable No symptoms Working on cholesterol with max dose tolerated of pravastatin Also BP control           Musculoskeletal and Integument   Osteopenia    Declines another dexa (last 2016) Pt declines another dexa and not interested right now in another fosamax course Last dexa in 2016 showed improvement  No falls/fx  Exercise is limited-going her PT         Genitourinary   Stage 3b chronic kidney disease (CKD) (HCC)    GFR stable at 45.7 Encouraged strongly to drink more fluids Avoids nsaids No uti symptoms         Other   Hyperlipidemia LDL goal <130 (Chronic)    Disc goals for lipids and reasons to control them Rev last labs with pt Rev low sat fat diet in detail Continues pravastatin 20 mg daily which she tolerates  LDL is down slt to 119      Depression with anxiety    Still worries a lot about family  Overall slt worse Will continue sertraline 100 mg bid which helps some Not interested in counseling      Gout    No flares Stable Uric acid in normal range Lab Results  Component Value Date   LABURIC 4.4 01/26/2023  Will continue to  watch Encouraged good hydration       Lymphocytosis    Stable/mild Wbc 10.9 No clinical changes  Saw hematology/ last note reviewed  Thought to be reactive      Prediabetes    Lab Results  Component Value Date   HGBA1C 5.6 01/26/2023  disc imp of low glycemic diet and wt loss to prevent DM2  Did encouraged her to eat more regularly with protein to avoid weak spells      Routine general medical  examination at a health care facility - Primary    Reviewed health habits including diet and exercise and skin cancer prevention Reviewed appropriate screening tests for age  Also reviewed health mt list, fam hx and immunization status , as well as social and family history   See HPI Labs reviewed and ordered Discussed shingrix-is iinterested Also interested in tetanus booster at pharmacy  Declines breast or colon cancer screening due to age  PHQis 56 - fairly stable in setting of worry and stress/ rev this  Recommended hearing aides for safety-pt declines for now        Weakness    stable Continues to have "weak spells"  Now poss happening after not eating  Discussed need for small frequent meals with protein and need for more fluids Will continue to follow

## 2023-02-02 NOTE — Assessment & Plan Note (Signed)
stable Continues to have "weak spells"  Now poss happening after not eating  Discussed need for small frequent meals with protein and need for more fluids Will continue to follow

## 2023-02-02 NOTE — Assessment & Plan Note (Signed)
Lab Results  Component Value Date   HGBA1C 5.6 01/26/2023   disc imp of low glycemic diet and wt loss to prevent DM2  Did encouraged her to eat more regularly with protein to avoid weak spells

## 2023-02-02 NOTE — Patient Instructions (Addendum)
To prevent the weak spells  I want you to eat either a small meal or snack with protein every 3 hours through the day  Also aim for at least 60 oz of fluids per day   Nuts and nut butters are great protein  Cheese , cottage cheese , dairy products have good protein  Almond milk or oat milk or soy milk are good also  Beans are good also (pintos or black beans)   If you are interested in the new shingles vaccine (Shingrix) - call your local pharmacy to check on coverage and availability  If affordable, get on a wait list at your pharmacy to get the vaccine.    You are due for a tetanus shot - you can get that at the pharmacy (separate that from the shingles shot by at least a month )     No change in medicines   Keep watching your blood pressure

## 2023-02-02 NOTE — Assessment & Plan Note (Signed)
Stable No symptoms Working on cholesterol with max dose tolerated of pravastatin Also BP control

## 2023-02-02 NOTE — Assessment & Plan Note (Signed)
Stable/mild Wbc 10.9 No clinical changes  Saw hematology/ last note reviewed  Thought to be reactive

## 2023-02-02 NOTE — Assessment & Plan Note (Signed)
Declines another dexa (last 2016) Pt declines another dexa and not interested right now in another fosamax course Last dexa in 2016 showed improvement  No falls/fx  Exercise is limited-going her PT

## 2023-02-02 NOTE — Assessment & Plan Note (Signed)
GFR stable at 45.7 Encouraged strongly to drink more fluids Avoids nsaids No uti symptoms

## 2023-02-02 NOTE — Assessment & Plan Note (Signed)
Still worries a lot about family  Overall slt worse Will continue sertraline 100 mg bid which helps some Not interested in counseling

## 2023-02-02 NOTE — Assessment & Plan Note (Addendum)
Reviewed health habits including diet and exercise and skin cancer prevention Reviewed appropriate screening tests for age  Also reviewed health mt list, fam hx and immunization status , as well as social and family history   See HPI Labs reviewed and ordered Discussed shingrix-is iinterested Also interested in tetanus booster at pharmacy  Declines breast or colon cancer screening due to age  PHQis 40 - fairly stable in setting of worry and stress/ rev this  Recommended hearing aides for safety-pt declines for now

## 2023-02-02 NOTE — Assessment & Plan Note (Signed)
bp in fair control at this time / stable   (better on 2nd check when more calm) BP Readings from Last 1 Encounters:  02/02/23 (!) 141/80  intol of many medications  No changes needed Most recent labs reviewed  Disc lifstyle change with low sodium diet and exercise  Plan to continue amlodipie 10mg  daily  Hydralazine 10 mg bid

## 2023-02-02 NOTE — Assessment & Plan Note (Signed)
Disc goals for lipids and reasons to control them Rev last labs with pt Rev low sat fat diet in detail Continues pravastatin 20 mg daily which she tolerates  LDL is down slt to 119

## 2023-02-02 NOTE — Assessment & Plan Note (Signed)
No flares Stable Uric acid in normal range Lab Results  Component Value Date   LABURIC 4.4 01/26/2023   Will continue to watch Encouraged good hydration

## 2023-02-08 ENCOUNTER — Telehealth: Payer: Self-pay | Admitting: Family Medicine

## 2023-02-08 NOTE — Telephone Encounter (Signed)
She is prone to spells of weakness and labile bp  Please check in with her today and see how she is feeling Thanks

## 2023-02-08 NOTE — Telephone Encounter (Signed)
[facility] Day - Client TELEPHONE ADVICE RECORD [facility] Patient Name First: [facility] Last: [place] Gender: Female DOB: 11-30-1928 Age: 89 Y 11 M 21 D Return Phone Number: [phone] (Primary) Address: [place]/ [place]/ [place]  [address] Client  [facility] Day - Client Client Site  [facility] - Day Provider Milinda Antis, Idamae Schuller - MD Contact Type Call Who Is Calling Patient / Member / Family / Caregiver Call Type Triage / Clinical Relationship To Patient Self Return Phone Number [phone] (Primary) Chief Complaint Weakness, Generalized Reason for Call Symptomatic / Request for Health Information Initial Comment Caller states her BP is 176/75 and she is having weak spells. Translation No  Nurse Assessment Nurse: Stefano Gaul, RN, Dwana Curd Date/Time (Eastern Time): 02/08/2023 10:17:08 AM Confirm and document reason for call. If symptomatic, describe symptoms. ---Caller states her BP is 176/75 and 166/86. yesterday she was weak and BP was 206/96. BP was 146/86 later in the day. She is not weak today. Does the patient have any new or worsening symptoms? ---Yes Will a triage be completed? ---Yes Related visit to physician within the last 2 weeks? ---Yes Does the PT have any chronic conditions? (i.e. diabetes, asthma, this includes High risk factors for pregnancy, etc.) ---Yes List chronic conditions. ---HTN; prediabetes; Is this a behavioral health or substance abuse call? ---No    Guidelines Guideline Title Affirmed Question Affirmed Notes Nurse Date/Time (Eastern Time) Blood Pressure - High Systolic BP >= 160 OR Diastolic >= 100 Stringer, RN, Vera 02/08/2023 10:20:16 AM Disp. Time Lamount Cohen Time) Disposition Final User 02/08/2023 10:25:17 AM See PCP within 24 Hours Yes Stefano Gaul, RN, Dwana Curd Final Disposition 02/08/2023 10:25:17 AM See PCP within 24 Hours Yes Stefano Gaul, RN, Dwana Curd PLEASE NOTE: All  timestamps contained within this report are represented as [place] Standard Time. CONFIDENTIALTY NOTICE: This fax transmission is intended only for the addressee. It contains information that is legally privileged, confidential or otherwise protected from use or disclosure. If you are not the intended recipient, you are strictly prohibited from reviewing, disclosing, copying using or disseminating any of this information or taking any action in reliance on or regarding this information. If you have received this fax in error, please notify us immediately by telephone so that we can arrange for its return to [place]. Phone: [phone], Toll-Free: [phone], Fax: [phone] Page: 2 of 2 Call Id: [id number] Disposition Overriden: SEE PCP WITHIN 3 DAYS Override Reason: Patient's symptoms need a higher level of care Caller Disagree/Comply Disagree Caller Understands Yes PreDisposition Call Doctor Care Advice Given Per Guideline SEE PCP WITHIN 24 HOURS: * IF OFFICE WILL BE OPEN: You need to be examined within the next 24 hours. Call your doctor (or NP/PA) when the office opens and make an appointment. CALL BACK IF: * Weakness or numbness of the face, arm or leg on one side of the body occurs * Difficulty walking, difficulty talking, or severe headache occurs * Chest pain or difficulty breathing occurs * You become worse CARE ADVICE given per High Blood Pressure (Adult) guideline. Comments User: Art Buff, RN Date/Time Lamount Cohen Time): 02/08/2023 10:24:21 AM pt does not want to make appt at this time but would like call back from office about increasing her BP medication. Please call pt back User: Art Buff, RN Date/Time Lamount Cohen Time): 02/08/2023 10:24:59 AM triage outcome upgraded to see physician within 24 hrs as pt had weak spell yesterday with increased BP Referrals GO TO FACILITY REFUSED

## 2023-02-08 NOTE — Telephone Encounter (Signed)
Those are not generally in stroke range  Have her follow up for appt and bring her cuff to see if it is accurate and how it is here  We have had a hard time regulating her blood pressure   Thanks

## 2023-02-08 NOTE — Telephone Encounter (Signed)
FYI: This call has been transferred to Access Nurse. Once the result note has been entered staff can address the message at that time.  Patient called in with the following symptoms:  Red Word:elevated blood pressure Patient called in and stated that her blood pressure yesterday was 206/96 and she was weak. Today her blood pressure is 176/75 and she stated that she has weak spells.   Please advise at [facility] [phone]  Message is routed to Provider Pool and [facility] Triage

## 2023-02-08 NOTE — Telephone Encounter (Signed)
Called pt and she said she feels fine she has no weakness or any other sxs but she is very concerned about her BP. Pt said  it was166/86 this am, 177/85 later in the morning, 153/77 at 12:00 pm, and 166/83 at 3:35 pm.  Pt is afraid that she will have a stroke if her BP stays this high and not sure what to do.  Please advise

## 2023-02-08 NOTE — Telephone Encounter (Signed)
Pt notified of Dr. Royden Purl comments and verbalized understanding, f/u appt scheduled

## 2023-02-09 ENCOUNTER — Ambulatory Visit (INDEPENDENT_AMBULATORY_CARE_PROVIDER_SITE_OTHER): Payer: Medicare HMO | Admitting: Family Medicine

## 2023-02-09 ENCOUNTER — Encounter: Payer: Self-pay | Admitting: Family Medicine

## 2023-02-09 VITALS — BP 149/62 | HR 79 | Temp 97.7°F | Ht 61.25 in | Wt 134.2 lb

## 2023-02-09 DIAGNOSIS — N1832 Chronic kidney disease, stage 3b: Secondary | ICD-10-CM

## 2023-02-09 DIAGNOSIS — F418 Other specified anxiety disorders: Secondary | ICD-10-CM

## 2023-02-09 DIAGNOSIS — I10 Essential (primary) hypertension: Secondary | ICD-10-CM | POA: Diagnosis not present

## 2023-02-09 DIAGNOSIS — R531 Weakness: Secondary | ICD-10-CM

## 2023-02-09 NOTE — Progress Notes (Signed)
Subjective:    Patient ID: Linda Robinson, female    DOB: 11-Apr-1929, 89 y.o.   MRN: [id number]  HPI Pt presents for f/u of HTN /  labile bp  Wt Readings from Last 3 Encounters:  02/09/23 134 lb 4 oz (60.9 kg)  02/02/23 132 lb 4 oz (60 kg)  01/05/23 132 lb (59.9 kg)   25.16 kg/m  Vitals:   02/09/23 1356  BP: (!) 164/68  Pulse: 79  Temp: 97.7 F (36.5 C)  SpO2: 95%      She is prone to spells of weakness and labile bp  Have changed medications through the years with no absolute cause found   Anxiety has been thought to play a role   Now weak when she stands Does not walk well  The L hand does not work well   Trying to eat more regularly to help as well - that helps the spells of weakness also   Anxiety is fair  Still taking sertraline 100 mg bid   Has family close by  This is helpful but she gets lonely  She talks to friends on the phone    Amlodipine 10 mg daily Hydralazine 10 mg bid  (cannot tolerate tid)  Intolerant to  Ace (in c K and cr) Losartan (increased K) Hctz (felt horrible)  Metoprolol  Carvedilol  Clonidine   Has seen cardiology in past for HTN    Did not tolerate tid hydralazine   Lab Results  Component Value Date   CREATININE 1.07 01/26/2023   BUN 21 01/26/2023   NA 141 01/26/2023   K 4.9 01/26/2023   CL 100 01/26/2023   CO2 32 01/26/2023   gFR 45.2   Patient Active Problem List   Diagnosis Date Noted   Knee pain 10/12/2022   Bladder pain 07/03/2022   Falls frequently 03/10/2022   Osteoarthritis of both knees 10/29/2021   Lower abdominal pain 07/23/2021   Diverticulosis 07/23/2021   Cervical radicular pain (left) 09/12/2020   Cervical disc disorder with radiculopathy of cervical region 09/12/2020   Cervical spondylosis 09/12/2020   Cervical fusion syndrome (C4-T1 ACDF) 09/12/2020   Neuropathic pain of hand, left 09/12/2020   Chronic pain syndrome 09/12/2020   Failed neck syndrome 07/30/2020   Left hand pain  07/30/2020   Weakness 05/01/2020   Grief reaction 11/17/2019   Cervical stenosis of spinal canal 11/17/2019   Hearing loss 11/17/2019   Poor balance 06/12/2019   Screening mammogram, encounter for 05/23/2018   Substernal chest pain 08/16/2017   Atherosclerosis of aorta (HCC) 03/09/2017   Weight loss 02/23/2017   Routine general medical examination at a health care facility 07/05/2015   Estrogen deficiency 07/05/2015   Prediabetes 01/01/2015   Lumbar spinal stenosis 11/13/2014   History of vertigo 10/05/2014   Leukocytosis 07/16/2014   Risk for falls 06/27/2014   Low back pain on right side with sciatica 07/24/2013   Edema 04/26/2013   Encounter for Medicare annual wellness exam 04/11/2013   Lymphocytosis 01/05/2012   GERD (gastroesophageal reflux disease)    Osteopenia 01/30/2009   Constipation 11/01/2007   Depression with anxiety 02/14/2007   Hyperlipidemia LDL goal <130 02/04/2007   Gout 02/04/2007   Essential hypertension 02/04/2007   DIVERTICULOSIS, COLON 02/04/2007   Stage 3b chronic kidney disease (CKD) (HCC) 02/04/2007   OSTEOARTHRITIS 02/04/2007   DEGENERATIVE DISC DISEASE 02/04/2007   Past Medical History:  Diagnosis Date   Ankylosis of lumbar spine    Basal cell carcinoma  removed   Cervical spine pain    Chest pain    a. 02/2017 MV: EF 54%, small, distal anterior wall/apical infacrt (likely misregistration), no ischemia-->Low risk; b. 02/2017 Echo: EF 55-60%, mild MR, PASP .   Chronic back pain    stenosis   Constipation    takes Colace daily   Depression    takes Zoloft daily   Diverticulosis of colon    GERD (gastroesophageal reflux disease)    atypical chest pain   Gout    hx of;was on Allopurinol but taken off 6 wks ago by medical MD   History of diverticulitis of colon    HLD (hyperlipidemia)    takes Pravastatin daily   HTN (hypertension)    takes Amlodipine and carvedilol   Lumbar spondylosis with myelopathy    Numbness    OA  (osteoarthritis)    right knee   Osteopenia    takes Calcium and Vit D daily   Scoliosis    Vertigo    takes Antivert daily as needed   Past Surgical History:  Procedure Laterality Date   APPENDECTOMY  at age 76   BACK SURGERY     CATARACT EXTRACTION Right 04/2006   CHOLECYSTECTOMY  1970   COLONOSCOPY     KNEE SURGERY Right    Medial meniscus tear   LUMBAR LAMINECTOMY/DECOMPRESSION MICRODISCECTOMY Bilateral 11/13/2014   Procedure: Lumbar one-two Bilateral laminectomy and foraminotomy, Left Lumbar one-two microdiscectomy ;  Surgeon: Reinaldo Meeker, MD;  Location: [facility];  Service: Neurosurgery;  Laterality: Bilateral;   NECK SURGERY  [phone]   x 3; fusions   PARTIAL COLECTOMY  09/1999   d/t diverticulitis   TUBAL LIGATION     Social History   Tobacco Use   Smoking status: Never   Smokeless tobacco: Never  Vaping Use   Vaping Use: Never used  Substance Use Topics   Alcohol use: Never    Alcohol/week: 0.0 standard drinks of alcohol   Drug use: Never   Family History  Problem Relation Age of Onset   Cancer Father        throat and bladder   Diabetes Father    Hypertension Father    Heart attack Father    Stroke Mother    Alzheimer's disease Mother    Hypertension Mother    Allergies  Allergen Reactions   Ace Inhibitors     REACTION: increased K+, increased creat.   Bactrim [Sulfamethoxazole-Trimethoprim]     rash   Cephalosporins     REACTION: reaction not known   Colesevelam     REACTION: constipation   Hctz [Hydrochlorothiazide]     Makes her feel "bad"   Losartan     Increased K   Lovastatin     REACTION: doesn't work   Lyrica [Pregabalin]     Felt bad    Raloxifene     REACTION: cramps   Rosuvastatin     REACTION: myalgia   Simvastatin     REACTION: myalgia   Current Outpatient Medications on File Prior to Visit  Medication Sig Dispense Refill   amLODipine (NORVASC) 10 MG tablet TAKE 1 TABLET DAILY 90 tablet 0   aspirin EC 81 MG tablet  Take 1 tablet (81 mg total) by mouth daily. 90 tablet 3   Calcium Carbonate-Vitamin D 500-125 MG-UNIT TABS Take by mouth.     Cranberry 250 MG CAPS Take 1 capsule by mouth daily.     docusate sodium (COLACE) 100 MG capsule  Take 1-2 capsules (100-200 mg total) by mouth 2 (two) times daily. 100 mg in the morning and 200 mg at night 180 capsule 1   famotidine (PEPCID) 20 MG tablet TAKE 1 TABLET TWICE A DAY 180 tablet 3   fish oil-omega-3 fatty acids 1000 MG capsule Take 1 g by mouth at bedtime.     hydrALAZINE (APRESOLINE) 10 MG tablet Take 1 tablet (10 mg total) by mouth in the morning and at bedtime. 180 tablet 0   meclizine (ANTIVERT) 25 MG tablet Take 25 mg by mouth 4 (four) times daily as needed for dizziness.     pravastatin (PRAVACHOL) 20 MG tablet TAKE 1 TABLET DAILY 90 tablet 0   sertraline (ZOLOFT) 100 MG tablet Take 1 tablet (100 mg total) by mouth in the morning and at bedtime. 180 tablet 3   Tetrahydrozoline HCl (EYE DROPS OP) Apply 1-2 drops to eye as needed (for dry eye).     No current facility-administered medications on file prior to visit.     Review of Systems  Constitutional:  Positive for fatigue. Negative for activity change, appetite change, fever and unexpected weight change.  HENT:  Negative for congestion, ear pain, rhinorrhea, sinus pressure and sore throat.   Eyes:  Negative for pain, redness and visual disturbance.  Respiratory:  Negative for cough, shortness of breath and wheezing.   Cardiovascular:  Negative for chest pain and palpitations.  Gastrointestinal:  Negative for abdominal pain, blood in stool, constipation and diarrhea.  Endocrine: Negative for polydipsia and polyuria.  Genitourinary:  Negative for dysuria, frequency and urgency.  Musculoskeletal:  Negative for arthralgias, back pain and myalgias.  Skin:  Negative for pallor and rash.  Allergic/Immunologic: Negative for environmental allergies.  Neurological:  Positive for weakness and numbness.  Negative for dizziness, tremors, seizures, syncope, speech difficulty and headaches.       Numbness /tingling is baseline in L hand   Generally weak all over   Hematological:  Negative for adenopathy. Does not bruise/bleed easily.  Psychiatric/Behavioral:  Negative for decreased concentration and dysphoric mood. The patient is nervous/anxious.        Objective:   Physical Exam Constitutional:      General: She is not in acute distress.    Appearance: Normal appearance. She is well-developed and normal weight. She is not ill-appearing or diaphoretic.  HENT:     Head: Normocephalic and atraumatic.     Mouth/Throat:     Mouth: Mucous membranes are moist.  Eyes:     General: No scleral icterus.    Conjunctiva/sclera: Conjunctivae normal.     Pupils: Pupils are equal, round, and reactive to light.  Neck:     Thyroid: No thyromegaly.     Vascular: No carotid bruit or JVD.  Cardiovascular:     Rate and Rhythm: Normal rate and regular rhythm.     Heart sounds: Normal heart sounds.     No gallop.  Pulmonary:     Effort: Pulmonary effort is normal. No respiratory distress.     Breath sounds: Normal breath sounds. No stridor. No wheezing, rhonchi or rales.  Abdominal:     General: There is no distension or abdominal bruit.     Palpations: Abdomen is soft.  Musculoskeletal:     Cervical back: Normal range of motion and neck supple.     Right lower leg: No edema.     Left lower leg: No edema.  Lymphadenopathy:     Cervical: No cervical adenopathy.  Skin:  General: Skin is warm and dry.     Coloration: Skin is not pale.     Findings: No rash.  Neurological:     Mental Status: She is alert.     Coordination: Coordination normal.     Deep Tendon Reflexes: Reflexes are normal and symmetric. Reflexes normal.     Comments: Baseline weak grip in L hand  Needs help standing from chair    Psychiatric:        Mood and Affect: Mood is anxious.     Comments: Candidly discusses  symptoms and stressors             Assessment & Plan:   Problem List Items Addressed This Visit       Cardiovascular and Mediastinum   Essential hypertension - Primary (Chronic)    Bp has been elevated/ more at home than here  Also mild CKD BP: (!) 149/62    Taking hydralazine 10 mg bid (did not tolerate tid) and amlodipine 10 mg   Ace and arb increase her K in the past Intol of hctz, metoprolol, carvedilol and clonidine Has seen cardiology in the past for bp control - still struggles Instructed to stop checking at home-anxiety falsely elevates Ref to pharmacy services for help      Relevant Orders   AMB Referral to Pharmacy Medication Management     Genitourinary   Stage 3b chronic kidney disease (CKD) (HCC)    Stable Working on bp control Avoids nsaids  Better fluid intake lately  GFR 45.2         Other   Depression with anxiety    While sertraline has helped some of her anxiety she still worries and gets very nervous about her bp  Reviewed stressors/ coping techniques/symptoms/ support sources/ tx options and side effects in detail today Have recommended she stop checking bp at home due to it causing anxiety  Would like her to see a counselor in the future  Physical problems limiting activity add to this  Also loneliness-discussed increase in socialization on the phone if possible  She would rather not move to a retirement community          Weakness    Generalized weakness (episodic weakness has improved with better eating pattern and fluids) Pt is unsteady and afraid to walk for exercise  Also had baseline weakness in L hand for cervical dz  Is interested in getting stronger safely  Ref for home health PT to see how they may be able to help with exercise plan keeping her chronic problems in mind        Relevant Orders   Ambulatory referral to Home Health

## 2023-02-09 NOTE — Assessment & Plan Note (Signed)
Generalized weakness (episodic weakness has improved with better eating pattern and fluids) Pt is unsteady and afraid to walk for exercise  Also had baseline weakness in L hand for cervical dz  Is interested in getting stronger safely  Ref for home health PT to see how they may be able to help with exercise plan keeping her chronic problems in mind

## 2023-02-09 NOTE — Assessment & Plan Note (Signed)
Bp has been elevated/ more at home than here  Also mild CKD BP: (!) 149/62    Taking hydralazine 10 mg bid (did not tolerate tid) and amlodipine 10 mg   Ace and arb increase her K in the past Intol of hctz, metoprolol, carvedilol and clonidine Has seen cardiology in the past for bp control - still struggles Instructed to stop checking at home-anxiety falsely elevates Ref to pharmacy services for help

## 2023-02-09 NOTE — Patient Instructions (Addendum)
You can check out some chair exercise programs  A pedaler would help also - can do that sitting  Some light weights or exercise bands (if you can use them with your left hand)   There are ways to exercise without standing   Stop checking blood pressure at home for now  I will refer you to our pharmacy program- I put the referral in  Please let us know if you don't hear in 1-2 weeks    I will also refer for home health physical therapy to get a therapist to your house to see what kind of exercise could get you stronger safely   In the meantime  Keep eating regularly and drinking fluids Keep in close contact with your family  Socialize whenever you can = by phone is fine

## 2023-02-09 NOTE — Assessment & Plan Note (Signed)
Stable Working on bp control Avoids nsaids  Better fluid intake lately  GFR 45.2

## 2023-02-09 NOTE — Assessment & Plan Note (Signed)
While sertraline has helped some of her anxiety she still worries and gets very nervous about her bp  Reviewed stressors/ coping techniques/symptoms/ support sources/ tx options and side effects in detail today Have recommended she stop checking bp at home due to it causing anxiety  Would like her to see a counselor in the future  Physical problems limiting activity add to this  Also loneliness-discussed increase in socialization on the phone if possible  She would rather not move to a retirement community

## 2023-02-11 ENCOUNTER — Telehealth: Payer: Self-pay

## 2023-02-11 DIAGNOSIS — N1832 Chronic kidney disease, stage 3b: Secondary | ICD-10-CM | POA: Diagnosis not present

## 2023-02-11 DIAGNOSIS — M109 Gout, unspecified: Secondary | ICD-10-CM | POA: Diagnosis not present

## 2023-02-11 DIAGNOSIS — M48061 Spinal stenosis, lumbar region without neurogenic claudication: Secondary | ICD-10-CM | POA: Diagnosis not present

## 2023-02-11 DIAGNOSIS — Z7982 Long term (current) use of aspirin: Secondary | ICD-10-CM | POA: Diagnosis not present

## 2023-02-11 DIAGNOSIS — I10 Essential (primary) hypertension: Secondary | ICD-10-CM | POA: Diagnosis not present

## 2023-02-11 DIAGNOSIS — I129 Hypertensive chronic kidney disease with stage 1 through stage 4 chronic kidney disease, or unspecified chronic kidney disease: Secondary | ICD-10-CM | POA: Diagnosis not present

## 2023-02-11 DIAGNOSIS — I700 Atherosclerosis of aorta: Secondary | ICD-10-CM | POA: Diagnosis not present

## 2023-02-11 DIAGNOSIS — Z9181 History of falling: Secondary | ICD-10-CM | POA: Diagnosis not present

## 2023-02-11 DIAGNOSIS — F418 Other specified anxiety disorders: Secondary | ICD-10-CM | POA: Diagnosis not present

## 2023-02-11 NOTE — Progress Notes (Signed)
   Care Guide Note  02/11/2023 Name: FLORIA ROWLEE MRN: [id number] DOB: 01-04-1929  Referred by: Tower, Audrie Gallus, MD Reason for referral : Care Coordination (Outreach to schedule with pharm d )   Linda Robinson is a 89 y.o. year old female who is a primary care patient of Tower, Audrie Gallus, MD. Estelle June was referred to the pharmacist for assistance related to HTN.    Successful contact was made with the patient to discuss pharmacy services including being ready for the pharmacist to call at least 5 minutes before the scheduled appointment time, to have medication bottles and any blood sugar or blood pressure readings ready for review. The patient agreed to meet with the pharmacist via with the pharmacist via telephone visit on (date/time).  02/24/2023  Penne Lash, RMA Care Guide [facility]  [place], [place] [address] Direct Dial: [phone] [email or web address]

## 2023-02-15 DIAGNOSIS — F418 Other specified anxiety disorders: Secondary | ICD-10-CM | POA: Diagnosis not present

## 2023-02-15 DIAGNOSIS — I129 Hypertensive chronic kidney disease with stage 1 through stage 4 chronic kidney disease, or unspecified chronic kidney disease: Secondary | ICD-10-CM | POA: Diagnosis not present

## 2023-02-15 DIAGNOSIS — Z7982 Long term (current) use of aspirin: Secondary | ICD-10-CM | POA: Diagnosis not present

## 2023-02-15 DIAGNOSIS — N1832 Chronic kidney disease, stage 3b: Secondary | ICD-10-CM | POA: Diagnosis not present

## 2023-02-15 DIAGNOSIS — Z9181 History of falling: Secondary | ICD-10-CM | POA: Diagnosis not present

## 2023-02-15 DIAGNOSIS — M48061 Spinal stenosis, lumbar region without neurogenic claudication: Secondary | ICD-10-CM | POA: Diagnosis not present

## 2023-02-15 DIAGNOSIS — M109 Gout, unspecified: Secondary | ICD-10-CM | POA: Diagnosis not present

## 2023-02-15 DIAGNOSIS — I700 Atherosclerosis of aorta: Secondary | ICD-10-CM | POA: Diagnosis not present

## 2023-02-16 ENCOUNTER — Telehealth: Payer: Self-pay | Admitting: Family Medicine

## 2023-02-16 DIAGNOSIS — Z9181 History of falling: Secondary | ICD-10-CM

## 2023-02-16 DIAGNOSIS — R531 Weakness: Secondary | ICD-10-CM

## 2023-02-16 NOTE — Telephone Encounter (Signed)
Thayer Ohm from [facility] called in to report that patient has fallen 2x over the weekend with no major injuries. He stated that she is just having some tailbone pain but didn't hit her head or anything. He stated that she is up and moving around like normal. He is also recommending that an order be put in for a 3 Wheel Rolator walker to [place], he stated that it will fit her hallways and walkways better. He stated that today her blood pressure was 185/95 with no symptoms but she did have a low blood sugar episode. Thank you!

## 2023-02-16 NOTE — Telephone Encounter (Signed)
Please ok the order for a walker  If you need a paper order to fax, send back to me   Her blood pressure is very labile (with mood)  and usually comes down on 2nd check but have her f/u if no  Follow up if balance worsens as well

## 2023-02-17 NOTE — Telephone Encounter (Signed)
Linda Robinson and he addressed multiple issues.  He said we would have to submit the order the the 3 Wheel Rolator walker, he is only recommending one because she has a regular walker and her home is small so a lot of her falls come from her not being able to get her walker through a door so she will park it and just walk out and fall. He did advise [place] that it may be hard for [place] to find a DME store that takes her insurance and also has the small 3 wheel Rolator walker.  He he said that a lot of her falls come from her either parking her walker she has due to space and the other is bending down to pick things up and when she raises back up is when she falls. He has discuss fall safety with her. Her last fall she fell on her bottom but no injuries reported and she didn't hit her head. He is going back out tomorrow so he will reeval her from her fall.  He did say that he agrees that her BP was elevated due to anxiety she was very anxious when he took her BP and then he did some breathing exercises and it went back to normal range.   FYI he did say if we can't get walker approved it's $130 out of pocket not sure if pt/family can afford it

## 2023-02-17 NOTE — Addendum Note (Signed)
Addended by: Roxy Manns A on: 02/17/2023 04:43 PM   Modules accepted: Orders

## 2023-02-17 NOTE — Telephone Encounter (Signed)
Order is in IN box 

## 2023-02-18 DIAGNOSIS — F418 Other specified anxiety disorders: Secondary | ICD-10-CM | POA: Diagnosis not present

## 2023-02-18 DIAGNOSIS — M48061 Spinal stenosis, lumbar region without neurogenic claudication: Secondary | ICD-10-CM | POA: Diagnosis not present

## 2023-02-18 DIAGNOSIS — M109 Gout, unspecified: Secondary | ICD-10-CM | POA: Diagnosis not present

## 2023-02-18 DIAGNOSIS — I129 Hypertensive chronic kidney disease with stage 1 through stage 4 chronic kidney disease, or unspecified chronic kidney disease: Secondary | ICD-10-CM | POA: Diagnosis not present

## 2023-02-18 DIAGNOSIS — I700 Atherosclerosis of aorta: Secondary | ICD-10-CM | POA: Diagnosis not present

## 2023-02-18 DIAGNOSIS — Z7982 Long term (current) use of aspirin: Secondary | ICD-10-CM | POA: Diagnosis not present

## 2023-02-18 DIAGNOSIS — N1832 Chronic kidney disease, stage 3b: Secondary | ICD-10-CM | POA: Diagnosis not present

## 2023-02-18 DIAGNOSIS — Z9181 History of falling: Secondary | ICD-10-CM | POA: Diagnosis not present

## 2023-02-18 NOTE — Telephone Encounter (Signed)
I Called:  [facility] [address] [place], [place] [address] Phone: [phone] Fax: [phone] (Owned by [employer])   They advised me they are in Network with [facility] but we would have to send the order to them to see if it's covered or not.   Order, demographics, and last OV note faxed to them.

## 2023-02-22 ENCOUNTER — Other Ambulatory Visit: Payer: Self-pay | Admitting: Family Medicine

## 2023-02-23 DIAGNOSIS — I129 Hypertensive chronic kidney disease with stage 1 through stage 4 chronic kidney disease, or unspecified chronic kidney disease: Secondary | ICD-10-CM | POA: Diagnosis not present

## 2023-02-23 DIAGNOSIS — M109 Gout, unspecified: Secondary | ICD-10-CM | POA: Diagnosis not present

## 2023-02-23 DIAGNOSIS — Z9181 History of falling: Secondary | ICD-10-CM | POA: Diagnosis not present

## 2023-02-23 DIAGNOSIS — F418 Other specified anxiety disorders: Secondary | ICD-10-CM | POA: Diagnosis not present

## 2023-02-23 DIAGNOSIS — Z7982 Long term (current) use of aspirin: Secondary | ICD-10-CM | POA: Diagnosis not present

## 2023-02-23 DIAGNOSIS — I700 Atherosclerosis of aorta: Secondary | ICD-10-CM | POA: Diagnosis not present

## 2023-02-23 DIAGNOSIS — N1832 Chronic kidney disease, stage 3b: Secondary | ICD-10-CM | POA: Diagnosis not present

## 2023-02-23 DIAGNOSIS — M48061 Spinal stenosis, lumbar region without neurogenic claudication: Secondary | ICD-10-CM | POA: Diagnosis not present

## 2023-02-24 ENCOUNTER — Other Ambulatory Visit: Payer: Medicare HMO | Admitting: Pharmacist

## 2023-02-24 NOTE — Patient Instructions (Signed)
Jadence,   It was great talking to you today!  Let's continue your current regimen right now of amlodipine once daily and hydralazine twice daily.   Do not check your blood pressure any more often than once a week, and only if you notice you are relaxed.   To appropriately check your blood pressure, make sure you do the following:  1) Avoid caffeine, exercise, or tobacco products for 30 minutes before checking. Empty your bladder. 2) Sit with your back supported in a flat-backed chair. Rest your arm on something flat (arm of the chair, table, etc). 3) Sit still with your feet flat on the floor, resting, for at least 5 minutes.  4) Check your blood pressure. Take 1-2 readings.  5) Write down these readings and bring with you to any provider appointments.  Bring your home blood pressure machine with you to a provider's office for accuracy comparison at least once a year.   Make sure you take your blood pressure medications before you come to any office visit, even if you were asked to fast for labs.   We would like to see your blood pressure ~140/80 or less.   Please reach out with any questions or concerns before our next call. Take care!  Catie Eppie Gibson, PharmD, BCACP, CPP [facility] [phone]

## 2023-02-24 NOTE — Progress Notes (Signed)
02/24/2023 Name: Linda Robinson MRN: [id number] DOB: 11/14/1928  Chief Complaint  Patient presents with   Medication Management   Hypertension    Linda Robinson is a 89 y.o. year old female who presented for a telephone visit.   They were referred to the pharmacist by their PCP for assistance in managing hypertension.   Subjective:  Care Team: Primary Care Provider: Tower, Audrie Gallus, MD ; Next Scheduled Visit: not scheduled   Medication Access/Adherence  Current Pharmacy:  [facility] - [place], [place] - [id number], [address] [place] [address] Phone: [phone] Fax: [phone]  [id number] [address] [place] - [address] [place] [address] Phone: [phone] Fax: [phone]  [facility] - [place], [place] - One [facility] AT Portal to Registered Caremark Sites [address] [place] [address] Phone: [phone] Fax: [phone]   Patient reports affordability concerns with their medications: No  Patient reports access/transportation concerns to their pharmacy: No  Patient reports adherence concerns with their medications:  No     Hypertension:  Current medications: amlodipine 10 mg QAM, hydralazine 10 mg twice daily Medications previously tried: history of HCTZ (prior history of orthostatic hypotension on both 12.5 mg and 25 mg), metoprolol, carvedilol (bradycardia, initially put on due to suspected angina), clonidine (hypotension/weakness, bradycardia, with both tablet and patch), losartan (increase in K to 5.2), ACEi (agent not documented, but chart documentation notes this also increased potassium); previously on higher dose of hydralazine as well, experienced dizziness.   No history of spironolactone, but concern for hyperkalemia with that as well.   Reports significant concerns related to long term health complications of high blood  pressure. Her husband passed away from a stroke. She became tearful when telling me this.   Patient has a validated, automated, upper arm home BP cuff - Omron, has been validated at the office 2 weeks ago Current blood pressure readings readings: reports that she was watching a ball game this weekend and decided to check her blood pressure since she was relaxed: (R)140/63 (L) 147/64  She has listened to Dr. Milinda Antis and not checked her blood pressure except for the above incident. She notes she was anxious about our call today, so I did not ask her to check while we were on the phone.   Patient reports infrequent hypotensive s/sx including dizziness, lightheadedness/weakness. One episode a few weeks ago, but less frequent than before.  Patient denies hypertensive symptoms including headache, chest pain, shortness of breath  Current meal patterns: notes she uses a little bit of salt when she cooks; notes she gets foods delivered from church three days a week and they do not salt the food.    Hyperlipidemia/ASCVD Risk Reduction  Current lipid lowering medications: pravastatin 20 mg daily Prior medications: rosuvastatin, atorvastatin, pravastatin 40 mg - muscle intolerances   Antiplatelet regimen: aspirin 81 mg daily  Objective:  Lab Results  Component Value Date   HGBA1C 5.6 01/26/2023    Lab Results  Component Value Date   CREATININE 1.07 01/26/2023   BUN 21 01/26/2023   NA 141 01/26/2023   K 4.9 01/26/2023   CL 100 01/26/2023   CO2 32 01/26/2023    Lab Results  Component Value Date   CHOL 203 (H) 01/26/2023   HDL 68.50 01/26/2023   LDLCALC 119 (H) 01/26/2023   LDLDIRECT 135.4 04/11/2013   TRIG 79.0 01/26/2023   CHOLHDL 3  01/26/2023    Medications Reviewed Today     Reviewed by Alden Hipp, RPH-CPP (Pharmacist) on 02/24/23 at [id number]  Med List Status: <None>   Medication Order Taking? Sig Documenting Provider Last Dose Status Informant  amLODipine (NORVASC) 10 MG  tablet [id number] Yes TAKE 1 TABLET DAILY Tower, Audrie Gallus, MD Taking Active   aspirin EC 81 MG tablet [id number] Yes Take 1 tablet (81 mg total) by mouth daily. [place] Ouch, MD Taking Active Self  Calcium Carbonate-Vitamin D 500-125 MG-UNIT TABS [id number] Yes Take 1 tablet by mouth daily. [provider] Taking Active Self           Med Note [place] Nickel Dec 16, 2020  9:51 AM)    Cranberry 450 MG TABS [id number] Yes Take 1 capsule by mouth daily. [provider] Taking Active Self  docusate sodium (COLACE) 100 MG capsule [id number] Yes Take 1-2 capsules (100-200 mg total) by mouth 2 (two) times daily. 100 mg in the morning and 200 mg at night Tower, Audrie Gallus, MD Taking Active   famotidine (PEPCID) 20 MG tablet [id number] Yes TAKE 1 TABLET TWICE A DAY Tower, Audrie Gallus, MD Taking Active   fish oil-omega-3 fatty acids 1000 MG capsule [id number] Yes Take 1 g by mouth at bedtime. [provider] Taking Active Self  furosemide (LASIX) 20 MG tablet [id number] Yes Take 20 mg by mouth as needed. [provider] Taking Active   hydrALAZINE (APRESOLINE) 10 MG tablet [id number] Yes Take 1 tablet (10 mg total) by mouth in the morning and at bedtime. Tower, Audrie Gallus, MD Taking Active   Magnesium 250 MG TABS [id number] Yes Take 1 tablet by mouth daily. [provider] Taking Active   meclizine (ANTIVERT) 25 MG tablet [id number] No Take 25 mg by mouth 4 (four) times daily as needed for dizziness.  Patient not taking: Reported on 02/24/2023   [provider] Not Taking Active Self  pravastatin (PRAVACHOL) 20 MG tablet [id number] Yes TAKE 1 TABLET DAILY Tower, Audrie Gallus, MD Taking Active   sertraline (ZOLOFT) 100 MG tablet [id number] Yes Take 1 tablet (100 mg total) by mouth in the morning and at bedtime. Tower, Audrie Gallus, MD Taking Active   Tetrahydrozoline HCl (EYE DROPS OP) [id number] No Apply 1-2 drops to eye as needed (for dry eye).  Patient not taking: Reported on  02/24/2023   [provider] Not Taking Active Self              Assessment/Plan:   Hypertension: - Currently controlled; as given age, lack of existing CV comorbidities, and prior medication intolerances, I would recommend a permissive BP goal of ~ 140/90. Extensive conversation regarding her overall health and importance of  - Reviewed appropriate blood pressure monitoring technique and reviewed goal blood pressure. Recommended to check home blood pressure and heart rate no more than once weekly, and only if feeling relaxed.  - Recommend to continue current regimen at this time - Considered conversation about SGLT2 given CKD, and possibly moderate impact on BP control, but worry about orthostatic hypotension with diuretic. Could consider moving forward. If so, would start with low dose Farxiga 5 mg daily (if unable to get 5 mg dose covered as this is the DM dosing, would use Farxiga 10 mg daily and split in half).    Hyperlipidemia/ASCVD Risk Reduction: - Currently relatively well controlled around goal of ~100. Given prior medication intolerances and lack of CV disease, would not attempt  additional therapy.  - Reviewed long term complications of uncontrolled cholesterol - Recommend to continue current regimen at this time  Follow Up Plan: phone call in 6 weeks  Catie Eppie Gibson, PharmD, BCACP, CPP [facility] [phone]

## 2023-03-02 ENCOUNTER — Telehealth: Payer: Self-pay | Admitting: Family Medicine

## 2023-03-02 DIAGNOSIS — R69 Illness, unspecified: Secondary | ICD-10-CM | POA: Diagnosis not present

## 2023-03-02 DIAGNOSIS — F418 Other specified anxiety disorders: Secondary | ICD-10-CM | POA: Diagnosis not present

## 2023-03-02 DIAGNOSIS — M48061 Spinal stenosis, lumbar region without neurogenic claudication: Secondary | ICD-10-CM | POA: Diagnosis not present

## 2023-03-02 DIAGNOSIS — I700 Atherosclerosis of aorta: Secondary | ICD-10-CM | POA: Diagnosis not present

## 2023-03-02 DIAGNOSIS — Z7982 Long term (current) use of aspirin: Secondary | ICD-10-CM | POA: Diagnosis not present

## 2023-03-02 DIAGNOSIS — Z9181 History of falling: Secondary | ICD-10-CM | POA: Diagnosis not present

## 2023-03-02 DIAGNOSIS — M48062 Spinal stenosis, lumbar region with neurogenic claudication: Secondary | ICD-10-CM

## 2023-03-02 DIAGNOSIS — N1832 Chronic kidney disease, stage 3b: Secondary | ICD-10-CM | POA: Diagnosis not present

## 2023-03-02 DIAGNOSIS — I129 Hypertensive chronic kidney disease with stage 1 through stage 4 chronic kidney disease, or unspecified chronic kidney disease: Secondary | ICD-10-CM | POA: Diagnosis not present

## 2023-03-02 DIAGNOSIS — M109 Gout, unspecified: Secondary | ICD-10-CM | POA: Diagnosis not present

## 2023-03-02 DIAGNOSIS — Z7409 Other reduced mobility: Secondary | ICD-10-CM | POA: Insufficient documentation

## 2023-03-02 DIAGNOSIS — R296 Repeated falls: Secondary | ICD-10-CM

## 2023-03-02 NOTE — Telephone Encounter (Signed)
I printed the px (order)  She may have to take to a med supply  Unsure who has one  Thanks

## 2023-03-02 NOTE — Telephone Encounter (Signed)
Thayer Ohm, Physical Therapist contacted the office regarding this patient. Wanted to inform Dr. Milinda Antis that the patient has an injury from walking into her door, states she has a bruise/ black eye on her right side. States the injury is due to her walker. Caller also stated the order that was placed for the patient to have a 3 wheeled walker was sent to [facility], they have been informed that [facility] does not carry this type of walker. Would like to know if Dr. Milinda Antis could resend this order to somewhere that has the 3 wheeled walker, she needs this kind specifically. Please advise, thank you.

## 2023-03-03 ENCOUNTER — Telehealth: Payer: Self-pay | Admitting: Family Medicine

## 2023-03-03 DIAGNOSIS — M48061 Spinal stenosis, lumbar region without neurogenic claudication: Secondary | ICD-10-CM

## 2023-03-03 DIAGNOSIS — Z7982 Long term (current) use of aspirin: Secondary | ICD-10-CM

## 2023-03-03 DIAGNOSIS — Z9181 History of falling: Secondary | ICD-10-CM

## 2023-03-03 DIAGNOSIS — I700 Atherosclerosis of aorta: Secondary | ICD-10-CM

## 2023-03-03 DIAGNOSIS — I129 Hypertensive chronic kidney disease with stage 1 through stage 4 chronic kidney disease, or unspecified chronic kidney disease: Secondary | ICD-10-CM

## 2023-03-03 DIAGNOSIS — F418 Other specified anxiety disorders: Secondary | ICD-10-CM

## 2023-03-03 DIAGNOSIS — M109 Gout, unspecified: Secondary | ICD-10-CM

## 2023-03-03 DIAGNOSIS — N1832 Chronic kidney disease, stage 3b: Secondary | ICD-10-CM

## 2023-03-03 NOTE — Telephone Encounter (Signed)
Patient would like Dr Milinda Antis to know that medication sertraline (ZOLOFT) 100 MG tablet is suppose to be sent to going forward. She's not in need of any refills now but it should go through the mail service,due to her not having transportation to the pharmacy to pick it up.

## 2023-03-04 NOTE — Telephone Encounter (Signed)
Order mailed to pt and she is aware. I couldn't find a local DME store that had one. Pt said he son is going to try and help get her one so she will give the order to her son when she gets it

## 2023-03-04 NOTE — Telephone Encounter (Signed)
Aware. 

## 2023-03-10 DIAGNOSIS — I129 Hypertensive chronic kidney disease with stage 1 through stage 4 chronic kidney disease, or unspecified chronic kidney disease: Secondary | ICD-10-CM | POA: Diagnosis not present

## 2023-03-10 DIAGNOSIS — N1832 Chronic kidney disease, stage 3b: Secondary | ICD-10-CM | POA: Diagnosis not present

## 2023-03-10 DIAGNOSIS — M109 Gout, unspecified: Secondary | ICD-10-CM | POA: Diagnosis not present

## 2023-03-10 DIAGNOSIS — I700 Atherosclerosis of aorta: Secondary | ICD-10-CM | POA: Diagnosis not present

## 2023-03-10 DIAGNOSIS — Z9181 History of falling: Secondary | ICD-10-CM | POA: Diagnosis not present

## 2023-03-10 DIAGNOSIS — F418 Other specified anxiety disorders: Secondary | ICD-10-CM | POA: Diagnosis not present

## 2023-03-10 DIAGNOSIS — M48061 Spinal stenosis, lumbar region without neurogenic claudication: Secondary | ICD-10-CM | POA: Diagnosis not present

## 2023-03-10 DIAGNOSIS — Z7982 Long term (current) use of aspirin: Secondary | ICD-10-CM | POA: Diagnosis not present

## 2023-04-06 ENCOUNTER — Other Ambulatory Visit: Payer: Medicare HMO | Admitting: Pharmacist

## 2023-04-06 NOTE — Progress Notes (Unsigned)
04/06/2023 Name: Linda Robinson MRN: [id number] DOB: 1929-04-17  Chief Complaint  Patient presents with   Medication Management   Hypertension    Linda Robinson is a 89 y.o. year old female who presented for a telephone visit.   They were referred to the pharmacist by their PCP for assistance in managing hypertension.    Subjective:  Care Team: Primary Care Provider: Tower, Audrie Gallus, MD ; Next Scheduled Visit: not scheduled  Medication Access/Adherence  Current Pharmacy:  [facility] - [place], [place] - [id number], [address] [place] [address] Phone: [phone] Fax: [phone]  [id number] [address], [place] - [address] [place] [address] Phone: [phone] Fax: [phone]  [facility] - [place], [place] - One [facility] AT Portal to Registered Caremark Sites [address] [place] [address] Phone: [phone] Fax: [phone]   Patient reports affordability concerns with their medications: No  Patient reports access/transportation concerns to their pharmacy: No  Patient reports adherence concerns with their medications:  No    Hypertension:   Current medications: amlodipine 10 mg QAM, hydralazine 10 mg twice daily  Medications previously tried: history of HCTZ (prior history of orthostatic hypotension on both 12.5 mg and 25 mg), metoprolol, carvedilol (bradycardia, initially put on due to suspected angina), clonidine (hypotension/weakness, bradycardia, with both tablet and patch), losartan (increase in K to 5.2), ACEi (agent not documented, but chart documentation notes this also increased potassium); previously on higher dose of hydralazine as well, experienced dizziness.    No history of spironolactone, but concern for hyperkalemia with that as well.     Patient has a validated, automated, upper arm home BP cuff - Omron, has been  validated at the office last month Current blood pressure readings readings: has not checked besides 1 reading this morning. 150/70s, HR 50   Patient denies any recent hypotensive s/sx including dizziness, lightheadedness/weakness. Patient denies hypertensive symptoms including headache, chest pain, shortness of breath   Hyperlipidemia/ASCVD Risk Reduction   Current lipid lowering medications: pravastatin 20 mg daily Prior medications: rosuvastatin, atorvastatin, pravastatin 40 mg - muscle intolerances    Antiplatelet regimen: aspirin 81 mg dail  Objective:  Lab Results  Component Value Date   HGBA1C 5.6 01/26/2023    Lab Results  Component Value Date   CREATININE 1.07 01/26/2023   BUN 21 01/26/2023   NA 141 01/26/2023   K 4.9 01/26/2023   CL 100 01/26/2023   CO2 32 01/26/2023    Lab Results  Component Value Date   CHOL 203 (H) 01/26/2023   HDL 68.50 01/26/2023   LDLCALC 119 (H) 01/26/2023   LDLDIRECT 135.4 04/11/2013   TRIG 79.0 01/26/2023   CHOLHDL 3 01/26/2023    Medications Reviewed Today     Reviewed by Alden Hipp, RPH-CPP (Pharmacist) on 02/24/23 at [id number]  Med List Status: <None>   Medication Order Taking? Sig Documenting Provider Last Dose Status Informant  amLODipine (NORVASC) 10 MG tablet [id number] Yes TAKE 1 TABLET DAILY Tower, Audrie Gallus, MD Taking Active   aspirin EC 81 MG tablet [id number] Yes Take 1 tablet (81 mg total) by mouth daily. Iran Ouch, MD Taking Active Self  Calcium Carbonate-Vitamin D 500-125 MG-UNIT TABS [id number] Yes Take 1 tablet by mouth daily. [provider] Taking Active Self           Med Note Kathrine Cords, DONNA S   Mon Dec 16, 2020  9:51 AM)    Cranberry 450 MG TABS [id number] Yes Take 1 capsule by mouth daily. [provider] Taking Active Self  docusate sodium (COLACE) 100 MG capsule [id number] Yes Take 1-2 capsules (100-200 mg total) by mouth 2 (two) times daily. 100 mg in the morning and 200 mg at night  Tower, Audrie Gallus, MD Taking Active   famotidine (PEPCID) 20 MG tablet [id number] Yes TAKE 1 TABLET TWICE A DAY Tower, Audrie Gallus, MD Taking Active   fish oil-omega-3 fatty acids 1000 MG capsule [id number] Yes Take 1 g by mouth at bedtime. [provider] Taking Active Self  furosemide (LASIX) 20 MG tablet [id number] Yes Take 20 mg by mouth as needed. [provider] Taking Active   hydrALAZINE (APRESOLINE) 10 MG tablet [id number] Yes Take 1 tablet (10 mg total) by mouth in the morning and at bedtime. Tower, Audrie Gallus, MD Taking Active   Magnesium 250 MG TABS [id number] Yes Take 1 tablet by mouth daily. [provider] Taking Active   meclizine (ANTIVERT) 25 MG tablet [id number] No Take 25 mg by mouth 4 (four) times daily as needed for dizziness.  Patient not taking: Reported on 02/24/2023   [provider] Not Taking Active Self  pravastatin (PRAVACHOL) 20 MG tablet [id number] Yes TAKE 1 TABLET DAILY Tower, Audrie Gallus, MD Taking Active   sertraline (ZOLOFT) 100 MG tablet [id number] Yes Take 1 tablet (100 mg total) by mouth in the morning and at bedtime. Tower, Audrie Gallus, MD Taking Active   Tetrahydrozoline HCl (EYE DROPS OP) [id number] No Apply 1-2 drops to eye as needed (for dry eye).  Patient not taking: Reported on 02/24/2023   [provider] Not Taking Active Self              Assessment/Plan:   Hypertension: - Currently uncontrolled; recommend goal <150/90 without symptoms concerning for hypotension or falls - Discussed options with patient - consider increasing hydralazine to 20 mg twice daily vs maintain current regimen and monitor. She had been on hydralazine 25 mg twice daily previously, but regimen was different. She elects to increase hydralazine. She just recently received an order from pharmacy so could take 2 10 mg tablets twice daily. Will discuss with PCP.  - Reviewed appropriate blood pressure monitoring technique and reviewed goal blood pressure.  Recommended to check home blood pressure and heart rate only before appointment - Instructed patient if she develops hypotensive symptoms to reduce back to 10 mg twice daily and call me.    Follow Up Plan: phone call in 4 weeks  Catie TClearance Coots, PharmD, BCACP, CPP Clinical Pharmacist [facility] [phone]

## 2023-04-07 ENCOUNTER — Other Ambulatory Visit: Payer: Medicare HMO | Admitting: Pharmacist

## 2023-04-07 MED ORDER — HYDRALAZINE HCL 10 MG PO TABS: 20.0000 mg | ORAL_TABLET | Freq: Two times a day (BID) | ORAL | 0 refills | Status: DC

## 2023-04-07 NOTE — Patient Instructions (Addendum)
Linda Robinson,   Increase hydralazine to 20 mg twice daily.   Please reach out if you develop any signs or symptoms of low blood pressure- lightheadedness, dizziness.   Thanks!  Catie Eppie Gibson, PharmD, BCACP, CPP Clinical Pharmacist [facility] [phone]

## 2023-04-09 ENCOUNTER — Other Ambulatory Visit: Payer: Self-pay | Admitting: Family Medicine

## 2023-04-09 NOTE — Telephone Encounter (Signed)
See pharmacy note from 04/07/23, she discussed increasing pt's med. Please advise

## 2023-04-20 ENCOUNTER — Other Ambulatory Visit: Payer: Self-pay | Admitting: Pharmacist

## 2023-04-20 MED ORDER — HYDRALAZINE HCL 10 MG PO TABS: 10.0000 mg | ORAL_TABLET | Freq: Two times a day (BID) | ORAL | 1 refills | Status: DC

## 2023-04-20 NOTE — Progress Notes (Signed)
Care Coordination Call  Received call from patient. She notes that she increased hydralazine to 20 mg twice daily as instructed. Had significant weakness and reports BP was 120/60. She reduced back to 10 mg twice daily.   Called patient, discussed that I agree with her reducing back to 10 mg twice daily. Would recommend to continue current regimen of amlodipine 10 mg daily, hydralazine 10 mg twice daily at this time. More permissive goal of ~150/90 appropriate given age, prior intolerances, fall risk.   Will follow up in 2 weeks as scheduled.   Catie Eppie Gibson, PharmD, BCACP, CPP Clinical Pharmacist [facility] [phone]

## 2023-05-01 ENCOUNTER — Other Ambulatory Visit: Payer: Self-pay | Admitting: Family Medicine

## 2023-05-12 ENCOUNTER — Other Ambulatory Visit: Payer: Medicare HMO | Admitting: Pharmacist

## 2023-05-12 NOTE — Progress Notes (Signed)
 I have reviewed the pharmacist's encounter and agree with their documentation.   Catie Eppie Gibson, PharmD, BCACP, CPP [facility] [phone]

## 2023-05-12 NOTE — Progress Notes (Signed)
05/12/2023 Name: Linda Robinson MRN: [id number] DOB: Dec 23, 1928  Chief Complaint  Patient presents with   Hypertension    Linda Robinson is a 89 y.o. year old female who presented for a telephone visit.   They were referred to the pharmacist by their PCP for assistance in managing hypertension.   Subjective: Pt is doing well today. Took BP in anticipation of telephone appt.   Care Team: Primary Care Provider: Tower, Audrie Gallus, MD ; Next Scheduled Visit: needs to be scheduled.   Medication Access/Adherence  Current Pharmacy:  [facility] - [place], [place] - [id number], [address], [address] [place] [address] Phone: [phone] Fax: [phone]  [id number] - [address], [place] - [address] [place] [address] Phone: [phone] Fax: [phone]  [facility] - [place], [place] - One [facility] AT Portal to Registered Caremark Sites [address] [place] [address] Phone: [phone] Fax: [phone]   Patient reports affordability concerns with their medications: No  Patient reports access/transportation concerns to their pharmacy: No  Patient reports adherence concerns with their medications:  No     Hypertension:  Current medications: amlodipine 10 mg daily,  hydralazine 10 mg BID  Medications previously tried: history of HCTZ (prior history of orthostatic hypotension on both 12.5 mg and 25 mg), metoprolol, carvedilol (bradycardia, initially put on due to suspected angina), clonidine (hypotension/weakness, bradycardia, with both tablet and patch), losartan (increase in K to 5.2), ACEi (agent not documented, but chart documentation notes this also increased potassium); previously on higher dose of hydralazine as well, experienced dizziness.   At last visit attempted to increase hydral to 20 mg BID but patient felt weak and reduced back to 10 mg BID.  Patient  has a validated, automated, upper arm home BP cuff Current blood pressure readings readings:  153/84 (after medications) 166/81, p 55 (had not taken BP medication  Patient denies hypotensive s/sx including dizziness, lightheadedness.  Patient denies hypertensive symptoms including headache, chest pain, shortness of breath  Uses walker- has some balance issues.   Hyperlipidemia/ASCVD Risk Reduction   Current lipid lowering medications: pravastatin 20 mg daily Prior medications: rosuvastatin, atorvastatin, pravastatin 40 mg - muscle intolerances    Antiplatelet regimen: aspirin 81 mg dail  Objective:  Lab Results  Component Value Date   HGBA1C 5.6 01/26/2023    Lab Results  Component Value Date   CREATININE 1.07 01/26/2023   BUN 21 01/26/2023   NA 141 01/26/2023   K 4.9 01/26/2023   CL 100 01/26/2023   CO2 32 01/26/2023    Lab Results  Component Value Date   CHOL 203 (H) 01/26/2023   HDL 68.50 01/26/2023   LDLCALC 119 (H) 01/26/2023   LDLDIRECT 135.4 04/11/2013   TRIG 79.0 01/26/2023   CHOLHDL 3 01/26/2023    Medications Reviewed Today     Reviewed by Particia Lather, RPH (Pharmacist) on 05/12/23 at 1507  Med List Status: <None>   Medication Order Taking? Sig Documenting Provider Last Dose Status Informant  amLODipine (NORVASC) 10 MG tablet [id number] Yes TAKE 1 TABLET DAILY Tower, Audrie Gallus, MD Taking Active   aspirin EC 81 MG tablet [id number] Yes Take 1 tablet (81 mg total) by mouth daily. [place] Ouch, MD Taking Active Self  Calcium Carbonate-Vitamin D 500-125 MG-UNIT TABS [id number] Yes Take 1 tablet by mouth daily. [provider] Taking Active Self  Med Note Damita Lack   Mon Dec 16, 2020  9:51 AM)    Cranberry 450 MG TABS [id number] Yes Take 1 capsule by mouth daily. [provider] Taking Active Self  docusate sodium (COLACE) 100 MG capsule [id number] Yes Take 1-2 capsules (100-200 mg total) by mouth 2 (two) times daily. 100 mg  in the morning and 200 mg at night Tower, Audrie Gallus, MD Taking Active   famotidine (PEPCID) 20 MG tablet [id number] Yes TAKE 1 TABLET TWICE A DAY Tower, Audrie Gallus, MD Taking Active   fish oil-omega-3 fatty acids 1000 MG capsule [id number] Yes Take 1 g by mouth at bedtime. [provider] Taking Active Self  furosemide (LASIX) 20 MG tablet [id number]  Take 20 mg by mouth as needed. [provider]  Active   hydrALAZINE (APRESOLINE) 10 MG tablet [id number] Yes Take 1 tablet (10 mg total) by mouth in the morning and at bedtime. Tower, Audrie Gallus, MD Taking Active   Magnesium 250 MG TABS [id number] Yes Take 1 tablet by mouth daily. [provider] Taking Active   meclizine (ANTIVERT) 25 MG tablet [id number] No Take 25 mg by mouth 4 (four) times daily as needed for dizziness.  Patient not taking: Reported on 02/24/2023   [provider] Not Taking Active Self  pravastatin (PRAVACHOL) 20 MG tablet [id number] Yes TAKE 1 TABLET DAILY Tower, Audrie Gallus, MD Taking Active   sertraline (ZOLOFT) 100 MG tablet [id number] Yes Take 1 tablet (100 mg total) by mouth in the morning and at bedtime. Tower, Audrie Gallus, MD Taking Active   Tetrahydrozoline HCl (EYE DROPS OP) [id number] No Apply 1-2 drops to eye as needed (for dry eye).  Patient not taking: Reported on 02/24/2023   [provider] Not Taking Active Self             Assessment/Plan:   Hypertension: - Currently variably controlled at patient-specific goal of <150/90 mmHg given advanced age, balance issues, and symptoms of hypotension with escalated therapy.  - Continue amlodipine 10 mg daily, hydralazine 10 mg BID.  - Reviewed long term cardiovascular and renal outcomes of uncontrolled blood pressure - Reviewed appropriate blood pressure monitoring technique and reviewed goal blood pressure. Recommended to check home blood pressure and heart rate if she is feeling symptoms of hypo or hypertension. Otherwise, continue to monitor  prior to medical appointments.  - Pt to continue following with Dr. Milinda Antis for other chronic disease management needs  Medication Management: - Discussed risk/benefit of aspirin 81 mg daily with patient, given age and reported balance issues. Upon chart review found that aspirin was prescribed in 2018 for s/sx consistent with unstable angina. Pt denies history of a heart attack or stroke. Instructed pt to continue taking aspirin. - Identified that pt is taking calcium carbonate with acid-suppressing therapy, famotidine. For maximal absorption and optimal bone health, recommended switching to calcium citrate + vitamin D3 when she runs out of her current supply.  Follow Up Plan: PCP - will notify provider that patient needs to be scheduled for f/u.   Nils Pyle, PharmD PGY1 Pharmacy Resident

## 2023-05-26 ENCOUNTER — Other Ambulatory Visit: Payer: Self-pay | Admitting: Pharmacist

## 2023-05-26 NOTE — Progress Notes (Signed)
Care Coordination Call  Received call from patient. [facility] 20 mg twice daily instead of 10 mg twice daily. Explained that this is an old prescription and to continue taking 10 mg twice daily as instructed.   [facility], requested that they cancel refills on 20 mg twice daily strength.  Catie Eppie Gibson, PharmD, BCACP, CPP Clinical Pharmacist [facility] [phone]

## 2023-06-11 ENCOUNTER — Ambulatory Visit (INDEPENDENT_AMBULATORY_CARE_PROVIDER_SITE_OTHER): Payer: Medicare HMO | Admitting: Family Medicine

## 2023-06-11 ENCOUNTER — Ambulatory Visit (INDEPENDENT_AMBULATORY_CARE_PROVIDER_SITE_OTHER)
Admission: RE | Admit: 2023-06-11 | Discharge: 2023-06-11 | Disposition: A | Discharge status: Home / Self Care | Payer: Medicare HMO | Source: Ambulatory Visit | Attending: Family Medicine | Admitting: Family Medicine

## 2023-06-11 ENCOUNTER — Encounter: Payer: Self-pay | Admitting: Family Medicine

## 2023-06-11 VITALS — BP 138/65 | HR 75 | Temp 97.2°F | Ht 61.03 in | Wt 128.0 lb

## 2023-06-11 DIAGNOSIS — W19XXXA Unspecified fall, initial encounter: Secondary | ICD-10-CM | POA: Insufficient documentation

## 2023-06-11 DIAGNOSIS — Y92009 Unspecified place in unspecified non-institutional (private) residence as the place of occurrence of the external cause: Secondary | ICD-10-CM | POA: Insufficient documentation

## 2023-06-11 DIAGNOSIS — M533 Sacrococcygeal disorders, not elsewhere classified: Secondary | ICD-10-CM | POA: Insufficient documentation

## 2023-06-11 DIAGNOSIS — M549 Dorsalgia, unspecified: Secondary | ICD-10-CM | POA: Diagnosis not present

## 2023-06-11 NOTE — Progress Notes (Unsigned)
Subjective:    Patient ID: Linda Robinson, female    DOB: 27-Nov-1928, 89 y.o.   MRN: [id number]  HPI  Wt Readings from Last 3 Encounters:  06/11/23 128 lb (58.1 kg)  02/09/23 134 lb 4 oz (60.9 kg)  02/02/23 132 lb 4 oz (60 kg)   24.17 kg/m  Vitals:   06/11/23 1154 06/11/23 1220  BP: (!) 142/82 138/65  Pulse: 75   Temp: (!) 97.2 F (36.2 C)   SpO2: 98%     Pt presents for follow up of a fall 3 wk ago  Low back pain   Was standing at her table Left knee gave out on her (that happens occasionally)  Nothing hurt except her tail bone   The pain has worsened  Has needed help getting up out of bed due to pain   Using heating pad-it does help   Today a little improved  Can lie more comfortably  Now does not hurt to lie down after 2 days ago   Pain In tailbone  Worse on the left  No radiation  Sharp in nature   Worst when trying to get up  Best when lying down   No numbness or focal weakness   No changes in voiding or continence   Over the counter Advil   DG Sacrum/Coccyx  Result Date: 06/11/2023 CLINICAL DATA:  fall 3 weeks ago on back side/ pain in tailbone area mild tenderness EXAM: SACRUM AND COCCYX - 2+ VIEW COMPARISON:  None Available. FINDINGS: Query mild cortical irregularity of the inferior sacrum. This is appreciated best on the lateral view, as overlying stool obscures portions of the sacrum on the frontal view. Otherwise no acute osseous abnormality appreciated. IMPRESSION: Query mild cortical regularity involving the inferior sacrum, possible minimally displaced fracture. Electronically Signed   By: Olive Bass M.D.   On: 06/11/2023 14:55       Patient Active Problem List   Diagnosis Date Noted   Fall at home 06/11/2023   Coccyx pain 06/11/2023   Mobility impaired 03/02/2023   Knee pain 10/12/2022   Bladder pain 07/03/2022   Falls frequently 03/10/2022   Osteoarthritis of both knees 10/29/2021   Lower abdominal pain 07/23/2021    Diverticulosis 07/23/2021   Cervical radicular pain (left) 09/12/2020   Cervical disc disorder with radiculopathy of cervical region 09/12/2020   Cervical spondylosis 09/12/2020   Cervical fusion syndrome (C4-T1 ACDF) 09/12/2020   Neuropathic pain of hand, left 09/12/2020   Chronic pain syndrome 09/12/2020   Failed neck syndrome 07/30/2020   Left hand pain 07/30/2020   Weakness 05/01/2020   Grief reaction 11/17/2019   Cervical stenosis of spinal canal 11/17/2019   Hearing loss 11/17/2019   Poor balance 06/12/2019   Screening mammogram, encounter for 05/23/2018   Substernal chest pain 08/16/2017   Atherosclerosis of aorta (HCC) 03/09/2017   Weight loss 02/23/2017   Routine general medical examination at a health care facility 07/05/2015   Estrogen deficiency 07/05/2015   Prediabetes 01/01/2015   Lumbar spinal stenosis 11/13/2014   History of vertigo 10/05/2014   Leukocytosis 07/16/2014   Risk for falls 06/27/2014   Low back pain on right side with sciatica 07/24/2013   Edema 04/26/2013   Encounter for Medicare annual wellness exam 04/11/2013   Lymphocytosis 01/05/2012   GERD (gastroesophageal reflux disease)    Osteopenia 01/30/2009   Constipation 11/01/2007   Depression with anxiety 02/14/2007   Hyperlipidemia LDL goal <130 02/04/2007   Gout 02/04/2007  Essential hypertension 02/04/2007   DIVERTICULOSIS, COLON 02/04/2007   Stage 3b chronic kidney disease (CKD) (HCC) 02/04/2007   OSTEOARTHRITIS 02/04/2007   DEGENERATIVE DISC DISEASE 02/04/2007   Past Medical History:  Diagnosis Date   Ankylosis of lumbar spine    Basal cell carcinoma    removed   Cervical spine pain    Chest pain    a. 02/2017 MV: EF 54%, small, distal anterior wall/apical infacrt (likely misregistration), no ischemia-->Low risk; b. 02/2017 Echo: EF 55-60%, mild MR, PASP .   Chronic back pain    stenosis   Constipation    takes Colace daily   Depression    takes Zoloft daily   Diverticulosis  of colon    GERD (gastroesophageal reflux disease)    atypical chest pain   Gout    hx of;was on Allopurinol but taken off 6 wks ago by medical MD   History of diverticulitis of colon    HLD (hyperlipidemia)    takes Pravastatin daily   HTN (hypertension)    takes Amlodipine and carvedilol   Lumbar spondylosis with myelopathy    Numbness    OA (osteoarthritis)    right knee   Osteopenia    takes Calcium and Vit D daily   Scoliosis    Vertigo    takes Antivert daily as needed   Past Surgical History:  Procedure Laterality Date   APPENDECTOMY  at age 37   BACK SURGERY     CATARACT EXTRACTION Right 04/2006   CHOLECYSTECTOMY  1970   COLONOSCOPY     KNEE SURGERY Right    Medial meniscus tear   LUMBAR LAMINECTOMY/DECOMPRESSION MICRODISCECTOMY Bilateral 11/13/2014   Procedure: Lumbar one-two Bilateral laminectomy and foraminotomy, Left Lumbar one-two microdiscectomy ;  Surgeon: Reinaldo Meeker, MD;  Location: [facility];  Service: Neurosurgery;  Laterality: Bilateral;   NECK SURGERY  [phone]   x 3; fusions   PARTIAL COLECTOMY  09/1999   d/t diverticulitis   TUBAL LIGATION     Social History   Tobacco Use   Smoking status: Never   Smokeless tobacco: Never  Vaping Use   Vaping status: Never Used  Substance Use Topics   Alcohol use: Never    Alcohol/week: 0.0 standard drinks of alcohol   Drug use: Never   Family History  Problem Relation Age of Onset   Cancer Father        throat and bladder   Diabetes Father    Hypertension Father    Heart attack Father    Stroke Mother    Alzheimer's disease Mother    Hypertension Mother    Allergies  Allergen Reactions   Ace Inhibitors     REACTION: increased K+, increased creat.   Bactrim [Sulfamethoxazole-Trimethoprim]     rash   Cephalosporins     REACTION: reaction not known   Colesevelam     REACTION: constipation   Hctz [Hydrochlorothiazide]     Makes her feel "bad"   Losartan     Increased K   Lovastatin      REACTION: doesn't work   Lyrica [Pregabalin]     Felt bad    Raloxifene     REACTION: cramps   Rosuvastatin     REACTION: myalgia   Simvastatin     REACTION: myalgia   Current Outpatient Medications on File Prior to Visit  Medication Sig Dispense Refill   amLODipine (NORVASC) 10 MG tablet TAKE 1 TABLET DAILY 90 tablet 2   aspirin  EC 81 MG tablet Take 1 tablet (81 mg total) by mouth daily. 90 tablet 3   Calcium Carbonate-Vitamin D 500-125 MG-UNIT TABS Take 1 tablet by mouth daily.     Cranberry 450 MG TABS Take 1 capsule by mouth daily.     docusate sodium (COLACE) 100 MG capsule Take 1-2 capsules (100-200 mg total) by mouth 2 (two) times daily. 100 mg in the morning and 200 mg at night 180 capsule 1   famotidine (PEPCID) 20 MG tablet TAKE 1 TABLET TWICE A DAY 180 tablet 1   fish oil-omega-3 fatty acids 1000 MG capsule Take 1 g by mouth at bedtime.     furosemide (LASIX) 20 MG tablet Take 20 mg by mouth as needed.     hydrALAZINE (APRESOLINE) 10 MG tablet Take 1 tablet (10 mg total) by mouth in the morning and at bedtime. 180 tablet 1   Magnesium 250 MG TABS Take 1 tablet by mouth daily.     pravastatin (PRAVACHOL) 20 MG tablet TAKE 1 TABLET DAILY 90 tablet 2   sertraline (ZOLOFT) 100 MG tablet Take 1 tablet (100 mg total) by mouth in the morning and at bedtime. 180 tablet 3   meclizine (ANTIVERT) 25 MG tablet Take 25 mg by mouth 4 (four) times daily as needed for dizziness. (Patient not taking: Reported on 02/24/2023)     Tetrahydrozoline HCl (EYE DROPS OP) Apply 1-2 drops to eye as needed (for dry eye). (Patient not taking: Reported on 02/24/2023)     No current facility-administered medications on file prior to visit.    Review of Systems  Constitutional:  Negative for activity change, appetite change, fatigue, fever and unexpected weight change.  HENT:  Negative for congestion, ear pain, rhinorrhea, sinus pressure and sore throat.   Eyes:  Negative for pain, redness and visual  disturbance.  Respiratory:  Negative for cough, shortness of breath and wheezing.   Cardiovascular:  Negative for chest pain and palpitations.  Gastrointestinal:  Negative for abdominal pain, blood in stool, constipation and diarrhea.  Endocrine: Negative for polydipsia and polyuria.  Genitourinary:  Negative for dysuria, frequency and urgency.  Musculoskeletal:  Positive for arthralgias and back pain. Negative for myalgias.  Skin:  Negative for pallor and rash.  Allergic/Immunologic: Negative for environmental allergies.  Neurological:  Negative for dizziness, syncope and headaches.  Hematological:  Negative for adenopathy. Does not bruise/bleed easily.  Psychiatric/Behavioral:  Negative for decreased concentration and dysphoric mood. The patient is not nervous/anxious.        Objective:   Physical Exam Constitutional:      General: She is not in acute distress.    Appearance: Normal appearance. She is normal weight. She is not ill-appearing or diaphoretic.     Comments: Frail appearing   Eyes:     Conjunctiva/sclera: Conjunctivae normal.     Pupils: Pupils are equal, round, and reactive to light.  Cardiovascular:     Rate and Rhythm: Normal rate and regular rhythm.  Musculoskeletal:     Comments: Some mild tenderness just left of coccyx  No bruising or deformity  Has loss of lordosis in LS  Scar from old LS surgery noted  Some pain with flex and also with lean to the left while sitting   Normal bent knee raise (on left causes some back pain)  No neuro changes   Skin:    Coloration: Skin is not pale.     Findings: No bruising, erythema or rash.  Neurological:  Mental Status: She is alert.     Sensory: No sensory deficit.     Motor: No weakness.     Deep Tendon Reflexes: Reflexes normal.     Comments: Gailt is fairly stable today with 4 prong walker   Psychiatric:        Mood and Affect: Mood normal.           Assessment & Plan:   Problem List Items Addressed  This Visit       Other   Coccyx pain - Primary    Pain primarily in coccyx in elderly female with fall 3 weeks prior  Left to midline  Not very tender today  Has  Ddd lumbar and past surgery  No neuro changes today and not radicular symptoms   Xr ordered of sactum/coccyx -cortical irregularity /possible sacral fracture Will refer to ortho  Encouraged use of heat (10 min at a time) that has helped Tylenol before ibuprofen for safety  Donut pillow if unable to set (was able today-some improvement from yesterday)      Relevant Orders   DG Sacrum/Coccyx (Completed)   Ambulatory referral to Orthopedic Surgery   Fall at home    3 weeks ago- standing by table and left knee wend out on her (has oa and this happens periodically)  Dan Humphreys is helpful but limited (left hand is weak) She does not feel need for wheelchair Able to ambulate today  Discussed fall prevention       Relevant Orders   Ambulatory referral to Orthopedic Surgery

## 2023-06-11 NOTE — Assessment & Plan Note (Signed)
Pain primarily in coccyx in elderly female with fall 3 weeks prior  Left to midline  Not very tender today  Has  Ddd lumbar and past surgery  No neuro changes today and not radicular symptoms   Xr ordered of sactum/coccyx -cortical irregularity /possible sacral fracture Will refer to ortho  Encouraged use of heat (10 min at a time) that has helped Tylenol before ibuprofen for safety  Donut pillow if unable to set (was able today-some improvement from yesterday)

## 2023-06-11 NOTE — Assessment & Plan Note (Signed)
3 weeks ago- standing by table and left knee wend out on her (has oa and this happens periodically)  Dan Humphreys is helpful but limited (left hand is weak) She does not feel need for wheelchair Able to ambulate today  Discussed fall prevention

## 2023-06-11 NOTE — Patient Instructions (Addendum)
Use heat for 10 minutes at a time  Keep using walker   Xray today  We will call you with the result (you will also be able to see on mychart)   Tylenol is ok for pain  Only use ibupfroen if absolutely needed

## 2023-06-14 ENCOUNTER — Encounter: Payer: Self-pay | Admitting: *Deleted

## 2023-06-16 DIAGNOSIS — M1712 Unilateral primary osteoarthritis, left knee: Secondary | ICD-10-CM | POA: Diagnosis not present

## 2023-06-16 DIAGNOSIS — S3219XD Other fracture of sacrum, subsequent encounter for fracture with routine healing: Secondary | ICD-10-CM | POA: Diagnosis not present

## 2023-07-14 ENCOUNTER — Telehealth: Payer: Self-pay | Admitting: Family Medicine

## 2023-07-14 MED ORDER — SERTRALINE HCL 100 MG PO TABS: 100.0000 mg | ORAL_TABLET | Freq: Two times a day (BID) | ORAL | 0 refills | Status: DC

## 2023-07-14 NOTE — Telephone Encounter (Signed)
Patient asked for future refills of mediation sertraline (ZOLOFT) 100 MG tablet to be sent through [facility]

## 2023-07-14 NOTE — Telephone Encounter (Signed)
done

## 2023-07-19 ENCOUNTER — Telehealth: Payer: Self-pay | Admitting: Family Medicine

## 2023-07-19 NOTE — Telephone Encounter (Signed)
Directions we have say 1 tab BID but will route to PCP for review

## 2023-07-19 NOTE — Telephone Encounter (Signed)
Looks like our instructions were correct  May need to check with pharmacy  Please re send if needed

## 2023-07-19 NOTE — Telephone Encounter (Signed)
Patient called in stating that when she picked up new prescription for medication sertraline (ZOLOFT) 100 MG tablet ,and she would like to get clarification on if she is still suppose to take 1 in the morning and 1 at bedtime,like she has always did.The instructions on new bottle says 2 in morning and night.

## 2023-07-19 NOTE — Telephone Encounter (Signed)
Pt notified of Dr. Royden Purl comments regarding med dose

## 2023-09-15 ENCOUNTER — Telehealth: Payer: Self-pay | Admitting: *Deleted

## 2023-09-15 NOTE — Telephone Encounter (Signed)
Watch for any uri symptoms like cough / nasal congestion/ sore throat/ headache or fever -let us know if any of these develop Drink fluids/ try and get regular rest to try and prevent it  Keep [place] posted   You can get over the counter covid tests to keep at home to check if you do get symptoms

## 2023-09-15 NOTE — Telephone Encounter (Signed)
Pt notified of Dr. Tower's comments and verbalized understanding  

## 2023-09-15 NOTE — Telephone Encounter (Signed)
Copied from CRM [id number]. Topic: Clinical - Medical Advice >> Sep 15, 2023 12:02 PM Denese Killings wrote: Reason for CRM: Patient was around a church member yesterday and today he tested positive for Covid. Patient wants to know what should she do and what symptoms should she have.

## 2023-10-07 ENCOUNTER — Other Ambulatory Visit: Payer: Self-pay | Admitting: Family Medicine

## 2023-10-07 ENCOUNTER — Telehealth: Payer: Self-pay

## 2023-10-07 NOTE — Telephone Encounter (Signed)
 Dose requested on refill is different then dose on med list, please advise

## 2023-10-07 NOTE — Telephone Encounter (Signed)
 Copied from CRM [id number]. Topic: Clinical - Medical Advice >> Oct 07, 2023 11:36 AM Maisie BROCKS wrote: Reason for CRM: pt called and stated that she had [facility] She asked for a nurse to give her call to clarify when she received it because she doesn't remember. She explained that [facility] has [facility] but she cannot get in touch with them. Please call and advise pt at [phone].

## 2023-10-07 NOTE — Telephone Encounter (Signed)
 Called pt and advise she received her 1st shingles vaccine on 08/22/23

## 2023-10-21 ENCOUNTER — Other Ambulatory Visit: Payer: Self-pay | Admitting: Family Medicine

## 2023-11-01 ENCOUNTER — Encounter: Payer: Self-pay | Admitting: Internal Medicine

## 2023-11-01 ENCOUNTER — Ambulatory Visit (INDEPENDENT_AMBULATORY_CARE_PROVIDER_SITE_OTHER): Payer: Medicare HMO | Admitting: Internal Medicine

## 2023-11-01 VITALS — BP 138/86 | HR 68 | Temp 98.5°F | Ht 61.25 in | Wt 131.0 lb

## 2023-11-01 DIAGNOSIS — R300 Dysuria: Secondary | ICD-10-CM | POA: Diagnosis not present

## 2023-11-01 DIAGNOSIS — N3000 Acute cystitis without hematuria: Secondary | ICD-10-CM | POA: Diagnosis not present

## 2023-11-01 LAB — POC URINALSYSI DIPSTICK (AUTOMATED)
Bilirubin, UA: NEGATIVE
Blood, UA: NEGATIVE
Glucose, UA: NEGATIVE
Nitrite, UA: NEGATIVE
Protein, UA: POSITIVE — AB
Spec Grav, UA: 1.02 (ref 1.010–1.025)
Urobilinogen, UA: 0.2 U/dL
pH, UA: 6 (ref 5.0–8.0)

## 2023-11-01 MED ORDER — NITROFURANTOIN MONOHYD MACRO 100 MG PO CAPS: 100.0000 mg | ORAL_CAPSULE | Freq: Two times a day (BID) | ORAL | 1 refills | Status: DC

## 2023-11-01 NOTE — Assessment & Plan Note (Addendum)
Fairly classic symptoms and similar to her past spells Last 3 cultures negative Urinalysis shows 1+ leuks, negative blood/nitrite Given allergies, will try macrobid 100 bid  3 days

## 2023-11-01 NOTE — Progress Notes (Signed)
Subjective:    Patient ID: Linda Robinson, female    DOB: 18-Mar-1929, 89 y.o.   MRN: [id number]  HPI Here due to urinary symptoms  Last night, got burning and urgency--but then couldn't go much Pain would be before she went Many times last night Not as bad today---not the pain like last night Seems like her other bladder infections  No fever No abdominal pain No blood  Current Outpatient Medications on File Prior to Visit  Medication Sig Dispense Refill   amLODipine (NORVASC) 10 MG tablet TAKE 1 TABLET DAILY 90 tablet 2   aspirin EC 81 MG tablet Take 1 tablet (81 mg total) by mouth daily. 90 tablet 3   Calcium Carbonate-Vitamin D 500-125 MG-UNIT TABS Take 1 tablet by mouth daily.     Cranberry 450 MG TABS Take 1 capsule by mouth daily.     docusate sodium (COLACE) 100 MG capsule Take 1-2 capsules (100-200 mg total) by mouth 2 (two) times daily. 100 mg in the morning and 200 mg at night 180 capsule 1   famotidine (PEPCID) 20 MG tablet TAKE 1 TABLET TWICE A DAY 180 tablet 0   fish oil-omega-3 fatty acids 1000 MG capsule Take 1 g by mouth at bedtime.     furosemide (LASIX) 20 MG tablet Take 20 mg by mouth as needed.     hydrALAZINE (APRESOLINE) 10 MG tablet Take 1 tablet (10 mg total) by mouth in the morning and at bedtime. 180 tablet 1   Magnesium 250 MG TABS Take 1 tablet by mouth daily.     meclizine (ANTIVERT) 25 MG tablet Take 25 mg by mouth 4 (four) times daily as needed for dizziness.     pravastatin (PRAVACHOL) 20 MG tablet TAKE 1 TABLET DAILY 90 tablet 2   sertraline (ZOLOFT) 100 MG tablet Take 1 tablet (100 mg total) by mouth in the morning and at bedtime. 180 tablet 0   Tetrahydrozoline HCl (EYE DROPS OP) Apply 1-2 drops to eye as needed (for dry eye).     No current facility-administered medications on file prior to visit.    Allergies  Allergen Reactions   Ace Inhibitors     REACTION: increased K+, increased creat.   Bactrim [Sulfamethoxazole-Trimethoprim]      rash   Cephalosporins     REACTION: reaction not known   Colesevelam     REACTION: constipation   Hctz [Hydrochlorothiazide]     Makes her feel "bad"   Losartan     Increased K   Lovastatin     REACTION: doesn't work   Lyrica [Pregabalin]     Felt bad    Raloxifene     REACTION: cramps   Rosuvastatin     REACTION: myalgia   Simvastatin     REACTION: myalgia    Past Medical History:  Diagnosis Date   Ankylosis of lumbar spine    Basal cell carcinoma    removed   Cervical spine pain    Chest pain    a. 02/2017 MV: EF 54%, small, distal anterior wall/apical infacrt (likely misregistration), no ischemia-->Low risk; b. 02/2017 Echo: EF 55-60%, mild MR, PASP .   Chronic back pain    stenosis   Constipation    takes Colace daily   Depression    takes Zoloft daily   Diverticulosis of colon    GERD (gastroesophageal reflux disease)    atypical chest pain   Gout    hx of;was on Allopurinol but taken off  6 wks ago by medical MD   History of diverticulitis of colon    HLD (hyperlipidemia)    takes Pravastatin daily   HTN (hypertension)    takes Amlodipine and carvedilol   Lumbar spondylosis with myelopathy    Numbness    OA (osteoarthritis)    right knee   Osteopenia    takes Calcium and Vit D daily   Scoliosis    Vertigo    takes Antivert daily as needed    Past Surgical History:  Procedure Laterality Date   APPENDECTOMY  at age 63   BACK SURGERY     CATARACT EXTRACTION Right 04/2006   CHOLECYSTECTOMY  1970   COLONOSCOPY     KNEE SURGERY Right    Medial meniscus tear   LUMBAR LAMINECTOMY/DECOMPRESSION MICRODISCECTOMY Bilateral 11/13/2014   Procedure: Lumbar one-two Bilateral laminectomy and foraminotomy, Left Lumbar one-two microdiscectomy ;  Surgeon: Reinaldo Meeker, MD;  Location: [facility];  Service: Neurosurgery;  Laterality: Bilateral;   NECK SURGERY  [phone]   x 3; fusions   PARTIAL COLECTOMY  09/1999   d/t diverticulitis   TUBAL LIGATION       Family History  Problem Relation Age of Onset   Cancer Father        throat and bladder   Diabetes Father    Hypertension Father    Heart attack Father    Stroke Mother    Alzheimer's disease Mother    Hypertension Mother     Social History   Socioeconomic History   Marital status: Widowed    Spouse name: Not on file   Number of children: 3   Years of education: 14   Highest education level: Associate degree: occupational, Scientist, product/process development, or vocational program  Occupational History   Occupation: Retired  Tobacco Use   Smoking status: Never   Smokeless tobacco: Never  Vaping Use   Vaping status: Never Used  Substance and Sexual Activity   Alcohol use: Never    Alcohol/week: 0.0 standard drinks of alcohol   Drug use: Never   Sexual activity: Not Currently    Birth control/protection: Post-menopausal  Other Topics Concern   Not on file  Social History Narrative   Lives alone   Coffee 1/2 c   Social Drivers of Health   Financial Resource Strain: Low Risk  (01/05/2023)   Overall Financial Resource Strain (CARDIA)    Difficulty of Paying Living Expenses: Not hard at all  Food Insecurity: No Food Insecurity (01/05/2023)   Hunger Vital Sign    Worried About Running Out of Food in the Last Year: Never true    Ran Out of Food in the Last Year: Never true  Transportation Needs: No Transportation Needs (01/05/2023)   PRAPARE - Administrator, Civil Service (Medical): No    Lack of Transportation (Non-Medical): No  Physical Activity: Inactive (01/05/2023)   Exercise Vital Sign    Days of Exercise per Week: 0 days    Minutes of Exercise per Session: 0 min  Stress: No Stress Concern Present (01/05/2023)   [employer] of Occupational Health - Occupational Stress Questionnaire    Feeling of Stress : Not at all  Social Connections: Socially Isolated (01/05/2023)   Social Connection and Isolation Panel [NHANES]    Frequency of Communication with Friends and Family:  More than three times a week    Frequency of Social Gatherings with Friends and Family: More than three times a week    Attends  Religious Services: Never    Active Member of Clubs or Organizations: No    Attends Banker Meetings: Never    Marital Status: Widowed  Intimate Partner Violence: Not At Risk (01/05/2023)   Humiliation, Afraid, Rape, and Kick questionnaire    Fear of Current or Ex-Partner: No    Emotionally Abused: No    Physically Abused: No    Sexually Abused: No   Review of Systems No new back pain No past kidney stones    Objective:   Physical Exam Constitutional:      Appearance: Normal appearance.  Abdominal:     Tenderness: There is no right CVA tenderness or left CVA tenderness.     Comments: Mild suprapubic tenderness  Neurological:     Mental Status: She is alert.            Assessment & Plan:

## 2023-11-01 NOTE — Addendum Note (Signed)
Addended by: Eual Fines on: 11/01/2023 02:24 PM   Modules accepted: Orders

## 2023-11-02 LAB — URINE CULTURE
MICRO NUMBER:: 16032950
Result:: NO GROWTH
SPECIMEN QUALITY:: ADEQUATE

## 2023-11-03 ENCOUNTER — Encounter: Payer: Self-pay | Admitting: Internal Medicine

## 2023-11-12 ENCOUNTER — Other Ambulatory Visit: Payer: Self-pay | Admitting: Family Medicine

## 2023-11-25 ENCOUNTER — Encounter: Payer: Self-pay | Admitting: Internal Medicine

## 2023-11-25 ENCOUNTER — Ambulatory Visit (INDEPENDENT_AMBULATORY_CARE_PROVIDER_SITE_OTHER): Payer: Medicare HMO | Admitting: Internal Medicine

## 2023-11-25 ENCOUNTER — Ambulatory Visit: Payer: Self-pay | Admitting: Family Medicine

## 2023-11-25 VITALS — BP 142/66 | HR 78 | Temp 97.7°F | Ht 61.25 in | Wt 132.0 lb

## 2023-11-25 DIAGNOSIS — M170 Bilateral primary osteoarthritis of knee: Secondary | ICD-10-CM

## 2023-11-25 DIAGNOSIS — I10 Essential (primary) hypertension: Secondary | ICD-10-CM | POA: Diagnosis not present

## 2023-11-25 MED ORDER — HYDRALAZINE HCL 10 MG PO TABS: 10.0000 mg | ORAL_TABLET | Freq: Three times a day (TID) | ORAL | 1 refills | Status: DC

## 2023-11-25 NOTE — Assessment & Plan Note (Signed)
 Will set up with Dr Patsy Lager to consider cortisone injection Asked her to take tylenol regularly

## 2023-11-25 NOTE — Progress Notes (Signed)
 Subjective:    Patient ID: Linda Robinson, female    DOB: 1929/08/11, 89 y.o.   MRN: [id number]  HPI Here due to blood pressure elevations Son brought her  Took BP last night 220/125 Felt bad at that point--and had balance problems yesterday (felt like she would fall if turning her head or bending over) No chest pain or SOB Brief headache last night--brief  She has checked it here and it was okay Son came over last night--and it correlated This morning--down to 190/85 Takes the hydralazine 9AM and 6PM or so. Amlodipine in morming  Eating regularly--trying to eat more (snacks etc)---fills up quick  Current Outpatient Medications on File Prior to Visit  Medication Sig Dispense Refill   amLODipine (NORVASC) 10 MG tablet TAKE 1 TABLET DAILY 90 tablet 0   aspirin EC 81 MG tablet Take 1 tablet (81 mg total) by mouth daily. 90 tablet 3   Calcium Carbonate-Vitamin D 500-125 MG-UNIT TABS Take 1 tablet by mouth daily.     Cranberry 450 MG TABS Take 1 capsule by mouth daily.     docusate sodium (COLACE) 100 MG capsule Take 1-2 capsules (100-200 mg total) by mouth 2 (two) times daily. 100 mg in the morning and 200 mg at night 180 capsule 1   famotidine (PEPCID) 20 MG tablet TAKE 1 TABLET TWICE A DAY 180 tablet 0   fish oil-omega-3 fatty acids 1000 MG capsule Take 1 g by mouth at bedtime.     furosemide (LASIX) 20 MG tablet Take 20 mg by mouth as needed.     hydrALAZINE (APRESOLINE) 10 MG tablet Take 1 tablet (10 mg total) by mouth in the morning and at bedtime. 180 tablet 1   Magnesium 250 MG TABS Take 1 tablet by mouth daily.     meclizine (ANTIVERT) 25 MG tablet Take 25 mg by mouth 4 (four) times daily as needed for dizziness.     pravastatin (PRAVACHOL) 20 MG tablet TAKE 1 TABLET DAILY 90 tablet 0   sertraline (ZOLOFT) 100 MG tablet Take 1 tablet (100 mg total) by mouth in the morning and at bedtime. 180 tablet 0   Tetrahydrozoline HCl (EYE DROPS OP) Apply 1-2 drops to eye as needed (for  dry eye).     No current facility-administered medications on file prior to visit.    Allergies  Allergen Reactions   Ace Inhibitors     REACTION: increased K+, increased creat.   Bactrim [Sulfamethoxazole-Trimethoprim]     rash   Cephalosporins     REACTION: reaction not known   Colesevelam     REACTION: constipation   Hctz [Hydrochlorothiazide]     Makes her feel "bad"   Losartan     Increased K   Lovastatin     REACTION: doesn't work   Lyrica [Pregabalin]     Felt bad    Raloxifene     REACTION: cramps   Rosuvastatin     REACTION: myalgia   Simvastatin     REACTION: myalgia    Past Medical History:  Diagnosis Date   Ankylosis of lumbar spine    Basal cell carcinoma    removed   Cervical spine pain    Chest pain    a. 02/2017 MV: EF 54%, small, distal anterior wall/apical infacrt (likely misregistration), no ischemia-->Low risk; b. 02/2017 Echo: EF 55-60%, mild MR, PASP .   Chronic back pain    stenosis   Constipation    takes Colace daily  Depression    takes Zoloft daily   Diverticulosis of colon    GERD (gastroesophageal reflux disease)    atypical chest pain   Gout    hx of;was on Allopurinol but taken off 6 wks ago by medical MD   History of diverticulitis of colon    HLD (hyperlipidemia)    takes Pravastatin daily   HTN (hypertension)    takes Amlodipine and carvedilol   Lumbar spondylosis with myelopathy    Numbness    OA (osteoarthritis)    right knee   Osteopenia    takes Calcium and Vit D daily   Scoliosis    Vertigo    takes Antivert daily as needed    Past Surgical History:  Procedure Laterality Date   APPENDECTOMY  at age 89   BACK SURGERY     CATARACT EXTRACTION Right 04/2006   CHOLECYSTECTOMY  1970   COLONOSCOPY     KNEE SURGERY Right    Medial meniscus tear   LUMBAR LAMINECTOMY/DECOMPRESSION MICRODISCECTOMY Bilateral 11/13/2014   Procedure: Lumbar one-two Bilateral laminectomy and foraminotomy, Left Lumbar one-two  microdiscectomy ;  Surgeon: Reinaldo Meeker, MD;  Location: [facility];  Service: Neurosurgery;  Laterality: Bilateral;   NECK SURGERY  [phone]   x 3; fusions   PARTIAL COLECTOMY  09/1999   d/t diverticulitis   TUBAL LIGATION      Family History  Problem Relation Age of Onset   Cancer Father        throat and bladder   Diabetes Father    Hypertension Father    Heart attack Father    Stroke Mother    Alzheimer's disease Mother    Hypertension Mother     Social History   Socioeconomic History   Marital status: Widowed    Spouse name: Not on file   Number of children: 3   Years of education: 14   Highest education level: Associate degree: occupational, Scientist, product/process development, or vocational program  Occupational History   Occupation: Retired  Tobacco Use   Smoking status: Never   Smokeless tobacco: Never  Vaping Use   Vaping status: Never Used  Substance and Sexual Activity   Alcohol use: Never    Alcohol/week: 0.0 standard drinks of alcohol   Drug use: Never   Sexual activity: Not Currently    Birth control/protection: Post-menopausal  Other Topics Concern   Not on file  Social History Narrative   Lives alone   Coffee 1/2 c   Social Drivers of Health   Financial Resource Strain: Low Risk  (01/05/2023)   Overall Financial Resource Strain (CARDIA)    Difficulty of Paying Living Expenses: Not hard at all  Food Insecurity: No Food Insecurity (01/05/2023)   Hunger Vital Sign    Worried About Running Out of Food in the Last Year: Never true    Ran Out of Food in the Last Year: Never true  Transportation Needs: No Transportation Needs (01/05/2023)   PRAPARE - Administrator, Civil Service (Medical): No    Lack of Transportation (Non-Medical): No  Physical Activity: Inactive (01/05/2023)   Exercise Vital Sign    Days of Exercise per Week: 0 days    Minutes of Exercise per Session: 0 min  Stress: No Stress Concern Present (01/05/2023)   [employer] of Occupational  Health - Occupational Stress Questionnaire    Feeling of Stress : Not at all  Social Connections: Socially Isolated (01/05/2023)   Social Connection and Isolation Panel [  NHANES]    Frequency of Communication with Friends and Family: More than three times a week    Frequency of Social Gatherings with Friends and Family: More than three times a week    Attends Religious Services: Never    Database administrator or Organizations: No    Attends Banker Meetings: Never    Marital Status: Widowed  Intimate Partner Violence: Not At Risk (01/05/2023)   Humiliation, Afraid, Rape, and Kick questionnaire    Fear of Current or Ex-Partner: No    Emotionally Abused: No    Physically Abused: No    Sexually Abused: No   Review of Systems Trouble sleeping in the past few nights---increased knee pain Low back pain as well Walks with cane--walker in house Uses OTC med for the arthritis--advil at times Hasn't been sick and no fever     Objective:   Physical Exam Constitutional:      Appearance: Normal appearance.  Cardiovascular:     Rate and Rhythm: Normal rate and regular rhythm.     Heart sounds: No murmur heard.    No gallop.  Pulmonary:     Effort: Pulmonary effort is normal.     Breath sounds: Normal breath sounds. No wheezing or rales.  Musculoskeletal:     Cervical back: Neck supple.     Right lower leg: No edema.     Left lower leg: No edema.  Lymphadenopathy:     Cervical: No cervical adenopathy.  Neurological:     Mental Status: She is alert.            Assessment & Plan:

## 2023-11-25 NOTE — Patient Instructions (Signed)
 Please increase the hydralazine to three times a day---same dose of 10mg . Avoid the advil Start tylenol (acetaminophen) arthritis 650mg  three times a day also The staff will set you up with Dr Patsy Lager to consider a cortisone injection in your knee

## 2023-11-25 NOTE — Assessment & Plan Note (Signed)
 BP Readings from Last 3 Encounters:  11/25/23 (!) 142/66  11/01/23 138/86  06/11/23 138/65   Very high last night---but much better here I am concerned about increasing meds due to risk of low BP at times May have been pain mediated---and could have been higher from advil Discussed taking tylenol regularly Will increase the hydralazine to 10 tid--since it may be wearing off and giving the higher numbers Continue amlodipine 10

## 2023-11-25 NOTE — Telephone Encounter (Signed)
 Appt scheduled with Dr. Alphonsus Sias today, will route to him and PCP as a fyi

## 2023-11-25 NOTE — Telephone Encounter (Signed)
 Copied from CRM [id number]. Topic: Clinical - Red Word Triage >> Nov 25, 2023  8:02 AM Kathryne Eriksson wrote: Red Word that prompted transfer to Nurse Triage: Elevated Blood Pressure 195/90 >> Nov 25, 2023  8:04 AM Kathryne Eriksson wrote: Son states patient (his mother) called him this morning, stating her blood pressed was elevated. Son states her blood pressure was 195/90, states she took her medication 40 minutes prior and when he rechecked it was reading 201/99. Other symptoms are dizziness and feeling weak.    Chief Complaint: High blood pressure Symptoms: general weaknes Frequency: intermittent Pertinent Negatives: Patient denies chest pain or bllurred vision Disposition: [] ED /[] Urgent Care (no appt availability in office) / [x] Appointment(In office/virtual)/ []  [facility]/ [] Home Care/ [] Refused Recommended Disposition /[] [facility]/ []  Follow-up with PCP Additional Notes: Spoke with son, pts BP was elevated last night and this morning. Pt took BP meds this morning, BP was 201/99, then 195/90, and then 182/86. Pt reporting general weakness, but son advises that patient is weak at baseline due to multiple back surgeries. Pt denies chest pain or blurred vision and denies missing any med doses recently. Appt scheduled wih Dr. Alphonsus Sias at [phone] for eval.  Reason for Disposition  Systolic BP  >= 180 OR Diastolic >= 110  Answer Assessment - Initial Assessment Questions 1. BLOOD PRESSURE: "What is the blood pressure?" "Did you take at least two measurements 5 minutes apart?"     201/99 then 195/90  2. ONSET: "When did you take your blood pressure?"     Taken 5 minutes ago  3. HOW: "How did you take your blood pressure?" (e.g., automatic home BP monitor, visiting nurse)     Home BP cuff  4. HISTORY: "Do you have a history of high blood pressure?"     Yes  5. MEDICINES: "Are you taking any medicines for blood pressure?" "Have you missed any doses recently?"     Amlodipine,  hydralazine, and lasix  6. OTHER SYMPTOMS: "Do you have any symptoms?" (e.g., blurred vision, chest pain, difficulty breathing, headache, weakness)     Weakness and "no feeling good"  7. PREGNANCY: "Is there any chance you are pregnant?" "When was your last menstrual period?"     No  Protocols used: Blood Pressure - High-A-AH

## 2023-11-25 NOTE — Telephone Encounter (Signed)
 Okay--I will check her at the appointment this morning

## 2023-11-28 NOTE — Progress Notes (Deleted)
   Linda Galeno T. Sparrow [place], MD, CAQ Sports Medicine [facility] at [facility] [address] [place], [address]  Phone: [phone]  FAX: [phone]  Linda Robinson - 89 y.o. female  MRN [id number]  Date of Birth: [birth year]  Date: 12/01/2023  PCP: Judy Pimple, MD  Referral: Judy Pimple, MD  No chief complaint on file.  Subjective:   Linda Robinson is a 89 y.o. very pleasant female patient with There is no height or weight on file to calculate BMI. who presents with the following:  Linda Robinson is a very nice lady who I have seen multiple times over the years, recall her quite well.  She presents to have some ongoing right-sided knee pain.    Review of Systems is noted in the HPI, as appropriate  Objective:   There were no vitals taken for this visit.  GEN: No acute distress; alert,appropriate. PULM: Breathing comfortably in no respiratory distress PSYCH: Normally interactive.   Laboratory and Imaging Data:  Assessment and Plan:   ***

## 2023-12-01 ENCOUNTER — Ambulatory Visit: Payer: Medicare HMO | Admitting: Family Medicine

## 2023-12-07 ENCOUNTER — Ambulatory Visit: Admitting: Family Medicine

## 2023-12-10 ENCOUNTER — Other Ambulatory Visit: Payer: Self-pay | Admitting: Family Medicine

## 2023-12-14 NOTE — Progress Notes (Unsigned)
   Pearly Apachito T. Kodi Guerrera, MD, CAQ Sports Medicine [facility] at [facility] [address] [place], [address]  Phone: [phone]  FAX: [phone]  JENNILEE DEMARCO - 89 y.o. female  MRN [id number]  Date of Birth: 1929-08-13  Date: 12/16/2023  PCP: Judy Pimple, MD  Referral: Judy Pimple, MD  No chief complaint on file.  Subjective:   MARCEDES TECH is a 89 y.o. very pleasant female patient with There is no height or weight on file to calculate BMI. who presents with the following:  Ms. Gauntt is a very well-known patient, and I recall her well having seen her over the years.  She presents today for follow-up and discussion of ongoing knee pain. No recent x-rays    Review of Systems is noted in the HPI, as appropriate  Objective:   There were no vitals taken for this visit.  GEN: No acute distress; alert,appropriate. PULM: Breathing comfortably in no respiratory distress PSYCH: Normally interactive.   Laboratory and Imaging Data:  Assessment and Plan:   ***

## 2023-12-16 ENCOUNTER — Encounter: Payer: Self-pay | Admitting: Family Medicine

## 2023-12-16 ENCOUNTER — Ambulatory Visit: Admitting: Family Medicine

## 2023-12-16 ENCOUNTER — Ambulatory Visit
Admission: RE | Admit: 2023-12-16 | Discharge: 2023-12-16 | Disposition: A | Discharge status: Home / Self Care | Source: Ambulatory Visit | Attending: Family Medicine | Admitting: Family Medicine

## 2023-12-16 ENCOUNTER — Other Ambulatory Visit: Payer: Self-pay | Admitting: Family Medicine

## 2023-12-16 VITALS — BP 120/70 | HR 78 | Temp 98.6°F | Ht 61.25 in | Wt 132.0 lb

## 2023-12-16 DIAGNOSIS — M170 Bilateral primary osteoarthritis of knee: Secondary | ICD-10-CM

## 2023-12-16 DIAGNOSIS — M1711 Unilateral primary osteoarthritis, right knee: Secondary | ICD-10-CM

## 2023-12-16 DIAGNOSIS — G8929 Other chronic pain: Secondary | ICD-10-CM | POA: Diagnosis not present

## 2023-12-16 DIAGNOSIS — M25461 Effusion, right knee: Secondary | ICD-10-CM | POA: Diagnosis not present

## 2023-12-16 DIAGNOSIS — M25561 Pain in right knee: Secondary | ICD-10-CM | POA: Diagnosis not present

## 2023-12-16 MED ORDER — TRIAMCINOLONE ACETONIDE 40 MG/ML IJ SUSP
40.0000 mg | Freq: Once | INTRAMUSCULAR | Status: AC
  Administered 2023-12-16: 40 mg via INTRA_ARTICULAR

## 2023-12-16 MED ORDER — TRIAMCINOLONE ACETONIDE 40 MG/ML IJ SUSP
40.0000 mg | Freq: Once | INTRAMUSCULAR | Status: AC
Start: 2023-12-16 — End: 2023-12-16
  Administered 2023-12-16: 40 mg via INTRA_ARTICULAR

## 2023-12-20 ENCOUNTER — Ambulatory Visit: Admitting: Family Medicine

## 2024-01-19 ENCOUNTER — Other Ambulatory Visit: Payer: Self-pay | Admitting: Family Medicine

## 2024-01-24 ENCOUNTER — Encounter: Payer: Self-pay | Admitting: Family Medicine

## 2024-01-24 ENCOUNTER — Ambulatory Visit (INDEPENDENT_AMBULATORY_CARE_PROVIDER_SITE_OTHER): Admitting: Family Medicine

## 2024-01-24 VITALS — BP 141/80 | HR 56 | Temp 97.5°F | Ht 61.25 in | Wt 131.0 lb

## 2024-01-24 DIAGNOSIS — R531 Weakness: Secondary | ICD-10-CM

## 2024-01-24 DIAGNOSIS — R5383 Other fatigue: Secondary | ICD-10-CM | POA: Insufficient documentation

## 2024-01-24 DIAGNOSIS — D72829 Elevated white blood cell count, unspecified: Secondary | ICD-10-CM

## 2024-01-24 DIAGNOSIS — N1832 Chronic kidney disease, stage 3b: Secondary | ICD-10-CM

## 2024-01-24 DIAGNOSIS — R7303 Prediabetes: Secondary | ICD-10-CM

## 2024-01-24 DIAGNOSIS — R8290 Unspecified abnormal findings in urine: Secondary | ICD-10-CM | POA: Diagnosis not present

## 2024-01-24 DIAGNOSIS — E785 Hyperlipidemia, unspecified: Secondary | ICD-10-CM | POA: Diagnosis not present

## 2024-01-24 DIAGNOSIS — I10 Essential (primary) hypertension: Secondary | ICD-10-CM

## 2024-01-24 DIAGNOSIS — R5382 Chronic fatigue, unspecified: Secondary | ICD-10-CM | POA: Diagnosis not present

## 2024-01-24 DIAGNOSIS — E875 Hyperkalemia: Secondary | ICD-10-CM

## 2024-01-24 DIAGNOSIS — R2689 Other abnormalities of gait and mobility: Secondary | ICD-10-CM

## 2024-01-24 LAB — CBC WITH DIFFERENTIAL/PLATELET
Basophils Absolute: 0 10*3/uL (ref 0.0–0.1)
Basophils Relative: 0.3 % (ref 0.0–3.0)
Eosinophils Absolute: 0.1 10*3/uL (ref 0.0–0.7)
Eosinophils Relative: 0.8 % (ref 0.0–5.0)
HCT: 42.7 % (ref 36.0–46.0)
Hemoglobin: 13.9 g/dL (ref 12.0–15.0)
Lymphocytes Relative: 40.4 % (ref 12.0–46.0)
Lymphs Abs: 3.8 10*3/uL (ref 0.7–4.0)
MCHC: 32.5 g/dL (ref 30.0–36.0)
MCV: 91.5 fl (ref 78.0–100.0)
Monocytes Absolute: 0.7 10*3/uL (ref 0.1–1.0)
Monocytes Relative: 7.4 % (ref 3.0–12.0)
Neutro Abs: 4.8 10*3/uL (ref 1.4–7.7)
Neutrophils Relative %: 51.1 % (ref 43.0–77.0)
Platelets: 273 10*3/uL (ref 150.0–400.0)
RBC: 4.67 Mil/uL (ref 3.87–5.11)
RDW: 15.3 % (ref 11.5–15.5)
WBC: 9.4 10*3/uL (ref 4.0–10.5)

## 2024-01-24 LAB — COMPREHENSIVE METABOLIC PANEL WITH GFR
ALT: 9 U/L (ref 0–35)
AST: 18 U/L (ref 0–37)
Albumin: 4.6 g/dL (ref 3.5–5.2)
Alkaline Phosphatase: 61 U/L (ref 39–117)
BUN: 27 mg/dL — ABNORMAL HIGH (ref 6–23)
CO2: 33 meq/L — ABNORMAL HIGH (ref 19–32)
Calcium: 9.9 mg/dL (ref 8.4–10.5)
Chloride: 98 meq/L (ref 96–112)
Creatinine, Ser: 0.98 mg/dL (ref 0.40–1.20)
GFR: 49.96 mL/min — ABNORMAL LOW (ref >60.00)
Glucose, Bld: 90 mg/dL (ref 70–99)
Potassium: 5.8 meq/L — ABNORMAL HIGH (ref 3.5–5.1)
Sodium: 140 meq/L (ref 135–145)
Total Bilirubin: 0.6 mg/dL (ref 0.2–1.2)
Total Protein: 6.9 g/dL (ref 6.0–8.3)

## 2024-01-24 LAB — TSH: TSH: 3.99 u[IU]/mL (ref 0.35–5.50)

## 2024-01-24 LAB — POC URINALSYSI DIPSTICK (AUTOMATED)
Bilirubin, UA: NEGATIVE
Blood, UA: 25 — AB
Glucose, UA: NEGATIVE
Ketones, UA: NEGATIVE
Nitrite, UA: NEGATIVE
Protein, UA: POSITIVE — AB
Spec Grav, UA: 1.015 (ref 1.010–1.025)
Urobilinogen, UA: 0.2 U/dL
pH, UA: 7.5 (ref 5.0–8.0)

## 2024-01-24 LAB — LIPID PANEL
Cholesterol: 250 mg/dL — ABNORMAL HIGH (ref 0–200)
HDL: 75.4 mg/dL (ref >39.00)
LDL Cholesterol: 155 mg/dL — ABNORMAL HIGH (ref 0–99)
NonHDL: 174.99
Total CHOL/HDL Ratio: 3
Triglycerides: 98 mg/dL (ref 0.0–149.0)
VLDL: 19.6 mg/dL (ref 0.0–40.0)

## 2024-01-24 LAB — HEMOGLOBIN A1C: Hgb A1c MFr Bld: 5.6 % (ref 4.6–6.5)

## 2024-01-24 LAB — VITAMIN B12: Vitamin B-12: 664 pg/mL (ref 211–911)

## 2024-01-24 NOTE — Assessment & Plan Note (Signed)
 bp in fair control at this time (good for her-on 2nd check) BP Readings from Last 1 Encounters:  01/24/24 (!) 141/80   No changes needed Hydralazine  was increase to TID from Dr Letvak but she could not remember to take it tid so continued bid (also in past does not tolerate tid)   Most recent labs reviewed  Disc lifstyle change with low sodium diet and exercise   Lab today  Continue Hydralazine  10 mg bid Amlodipine  10 mg daily   No arb due to K  No hydrochlorothiazide  due to side effects  Follow up next week

## 2024-01-24 NOTE — Progress Notes (Signed)
 Subjective:    Patient ID: Linda Robinson, female    DOB: January 18, 1929, 89 y.o.   MRN: [id number]  HPI  Wt Readings from Last 3 Encounters:  01/24/24 131 lb (59.4 kg)  12/16/23 132 lb (59.9 kg)  11/25/23 132 lb (59.9 kg)   24.55 kg/m  Vitals:   01/24/24 1117 01/24/24 1145  BP: (!) 168/78 (!) 141/80  Pulse: (!) 56   Temp: (!) 97.5 F (36.4 C)   SpO2: 98%    Pt presents for  HTN Fatigue/malaise    HTN bp is stable today  No cp or palpitations or headaches or edema  No side effects to medicines  BP Readings from Last 3 Encounters:  01/24/24 (!) 141/80  12/16/23 120/70  11/25/23 (!) 142/66    At visit with Dr Joelle Musca hydralazine  was increase to tid  Told to hold advil    Takes amlodipine  10 mg daily   Feels weak all over  Works around the house and has to take breaks  Poor balance  - bruised her arm  Got a black eye - about 2 weeks ago / fell against her brick fireplace after wheel on her walker went out No concussion symptoms   Is eating regularly  Trying to get protein  Tries to drink fluid-this is hard for her     In past arb caused increase K  Hydrochlorothiazide  caused fatigue   Lab Results  Component Value Date   NA 141 01/26/2023   K 4.9 01/26/2023   CO2 32 01/26/2023   GLUCOSE 88 01/26/2023   BUN 21 01/26/2023   CREATININE 1.07 01/26/2023   CALCIUM 9.5 01/26/2023   GFR 45.27 (L) 01/26/2023   GFRNONAA 41 (L) 06/11/2022   Due for labs   Has seen heme for elevated wbc/ thought to be reactive   Lab Results  Component Value Date   WBC 10.9 (H) 01/26/2023   HGB 14.5 01/26/2023   HCT 43.3 01/26/2023   MCV 88.9 01/26/2023   PLT 280.0 01/26/2023   Lab Results  Component Value Date   ALT 8 01/26/2023   AST 18 01/26/2023   ALKPHOS 56 01/26/2023   BILITOT 0.6 01/26/2023   Lab Results  Component Value Date   WBC 10.9 (H) 01/26/2023   HGB 14.5 01/26/2023   HCT 43.3 01/26/2023   MCV 88.9 01/26/2023   PLT 280.0 01/26/2023   Lab  Results  Component Value Date   VITAMINB12 661 02/23/2017   Last vitamin D  Lab Results  Component Value Date   VD25OH 50.88 06/27/2014     GAD Sertraline  150 mg daily  More stress -her grandson had to have surgery , also her GG daughter  Lot of family health problems         Patient Active Problem List   Diagnosis Date Noted   Fatigue 01/24/2024   Acute cystitis without hematuria 11/01/2023   Fall at home 06/11/2023   Coccyx pain 06/11/2023   Mobility impaired 03/02/2023   Knee pain 10/12/2022   Bladder pain 07/03/2022   Falls frequently 03/10/2022   Osteoarthritis of both knees 10/29/2021   Lower abdominal pain 07/23/2021   Diverticulosis 07/23/2021   Cervical radicular pain (left) 09/12/2020   Cervical disc disorder with radiculopathy of cervical region 09/12/2020   Cervical spondylosis 09/12/2020   Cervical fusion syndrome (C4-T1 ACDF) 09/12/2020   Neuropathic pain of hand, left 09/12/2020   Chronic pain syndrome 09/12/2020   Failed neck syndrome 07/30/2020   Left hand pain  07/30/2020   Weakness 05/01/2020   Grief reaction 11/17/2019   Cervical stenosis of spinal canal 11/17/2019   Hearing loss 11/17/2019   Poor balance 06/12/2019   Screening mammogram, encounter for 05/23/2018   Substernal chest pain 08/16/2017   Atherosclerosis of aorta (HCC) 03/09/2017   Weight loss 02/23/2017   Routine general medical examination at a health care facility 07/05/2015   Estrogen deficiency 07/05/2015   Prediabetes 01/01/2015   Lumbar spinal stenosis 11/13/2014   History of vertigo 10/05/2014   Leukocytosis 07/16/2014   Risk for falls 06/27/2014   Right-sided low back pain with sciatica 07/24/2013   Edema 04/26/2013   Encounter for Medicare annual wellness exam 04/11/2013   Lymphocytosis 01/05/2012   GERD (gastroesophageal reflux disease)    Osteopenia 01/30/2009   Constipation 11/01/2007   Depression with anxiety 02/14/2007   Hyperlipidemia LDL goal <130  02/04/2007   Gout 02/04/2007   Essential hypertension 02/04/2007   DIVERTICULOSIS, COLON 02/04/2007   Stage 3b chronic kidney disease (CKD) (HCC) 02/04/2007   OSTEOARTHRITIS 02/04/2007   DEGENERATIVE DISC DISEASE 02/04/2007   Past Medical History:  Diagnosis Date   Ankylosis of lumbar spine    Basal cell carcinoma    removed   Cervical spine pain    Chest pain    a. 02/2017 MV: EF 54%, small, distal anterior wall/apical infacrt (likely misregistration), no ischemia-->Low risk; b. 02/2017 Echo: EF 55-60%, mild MR, PASP .   Chronic back pain    stenosis   Constipation    takes Colace daily   Depression    takes Zoloft  daily   Diverticulosis of colon    GERD (gastroesophageal reflux disease)    atypical chest pain   Gout    hx of;was on Allopurinol but taken off 6 wks ago by medical MD   History of diverticulitis of colon    HLD (hyperlipidemia)    takes Pravastatin  daily   HTN (hypertension)    takes Amlodipine  and carvedilol    Lumbar spondylosis with myelopathy    Numbness    OA (osteoarthritis)    right knee   Osteopenia    takes Calcium and Vit D daily   Scoliosis    Vertigo    takes Antivert  daily as needed   Past Surgical History:  Procedure Laterality Date   APPENDECTOMY  at age 66   BACK SURGERY     CATARACT EXTRACTION Right 04/2006   CHOLECYSTECTOMY  1970   COLONOSCOPY     KNEE SURGERY Right    Medial meniscus tear   LUMBAR LAMINECTOMY/DECOMPRESSION MICRODISCECTOMY Bilateral 11/13/2014   Procedure: Lumbar one-two Bilateral laminectomy and foraminotomy, Left Lumbar one-two microdiscectomy ;  Surgeon: Augustine Blocker, MD;  Location: [facility];  Service: Neurosurgery;  Laterality: Bilateral;   NECK SURGERY  [phone]   x 3; fusions   PARTIAL COLECTOMY  09/1999   d/t diverticulitis   TUBAL LIGATION     Social History   Tobacco Use   Smoking status: Never   Smokeless tobacco: Never  Vaping Use   Vaping status: Never Used  Substance Use Topics    Alcohol  use: Never    Alcohol /week: 0.0 standard drinks of alcohol    Drug use: Never   Family History  Problem Relation Age of Onset   Cancer Father        throat and bladder   Diabetes Father    Hypertension Father    Heart attack Father    Stroke Mother    Alzheimer's disease  Mother    Hypertension Mother    Allergies  Allergen Reactions   Ace Inhibitors     REACTION: increased K+, increased creat.   Bactrim  [Sulfamethoxazole -Trimethoprim ]     rash   Cephalosporins     REACTION: reaction not known   Colesevelam     REACTION: constipation   Hctz [Hydrochlorothiazide ]     Makes her feel "bad"   Losartan      Increased K   Lovastatin     REACTION: doesn't work   Lyrica  [Pregabalin ]     Felt bad    Raloxifene     REACTION: cramps   Rosuvastatin     REACTION: myalgia   Simvastatin     REACTION: myalgia   Current Outpatient Medications on File Prior to Visit  Medication Sig Dispense Refill   acetaminophen  (TYLENOL ) 650 MG CR tablet Take 650 mg by mouth in the morning, at noon, and at bedtime.     amLODipine  (NORVASC ) 10 MG tablet TAKE 1 TABLET DAILY 90 tablet 0   aspirin  EC 81 MG tablet Take 1 tablet (81 mg total) by mouth daily. 90 tablet 3   Cranberry 450 MG TABS Take 1 capsule by mouth daily.     docusate sodium  (COLACE) 100 MG capsule Take 1-2 capsules (100-200 mg total) by mouth 2 (two) times daily. 100 mg in the morning and 200 mg at night 180 capsule 1   famotidine  (PEPCID ) 20 MG tablet TAKE 1 TABLET TWICE A DAY 180 tablet 0   fish oil-omega-3 fatty acids 1000 MG capsule Take 1 g by mouth at bedtime.     furosemide  (LASIX ) 20 MG tablet Take 20 mg by mouth as needed.     hydrALAZINE  (APRESOLINE ) 10 MG tablet Take 1 tablet (10 mg total) by mouth 3 (three) times daily. (Patient taking differently: Take 10 mg by mouth in the morning and at bedtime.) 270 tablet 1   Magnesium  250 MG TABS Take 1 tablet by mouth daily.     pravastatin  (PRAVACHOL ) 20 MG tablet TAKE 1  TABLET DAILY 90 tablet 0   sertraline  (ZOLOFT ) 100 MG tablet TAKE 1.5 TABLETS (150MG  TOTAL) BY MOUTH DAILY 135 tablet 3   Tetrahydrozoline HCl (EYE DROPS OP) Apply 1-2 drops to eye as needed (for dry eye).     No current facility-administered medications on file prior to visit.    Review of Systems  Constitutional:  Positive for fatigue. Negative for activity change, appetite change, fever and unexpected weight change.  HENT:  Negative for congestion, ear pain, rhinorrhea, sinus pressure and sore throat.   Eyes:  Negative for pain, redness and visual disturbance.  Respiratory:  Negative for cough, shortness of breath and wheezing.   Cardiovascular:  Positive for leg swelling. Negative for chest pain and palpitations.  Gastrointestinal:  Negative for abdominal pain, blood in stool, constipation and diarrhea.  Endocrine: Negative for polydipsia and polyuria.  Genitourinary:  Negative for dysuria, frequency and urgency.  Musculoskeletal:  Negative for arthralgias, back pain and myalgias.  Skin:  Negative for pallor and rash.  Allergic/Immunologic: Negative for environmental allergies.  Neurological:  Negative for dizziness, syncope and headaches.  Hematological:  Negative for adenopathy. Does not bruise/bleed easily.  Psychiatric/Behavioral:  Negative for decreased concentration and dysphoric mood. The patient is nervous/anxious.        Objective:   Physical Exam Constitutional:      General: She is not in acute distress.    Appearance: Normal appearance. She is well-developed. She is  obese. She is not ill-appearing or diaphoretic.  HENT:     Head: Normocephalic and atraumatic.  Eyes:     Conjunctiva/sclera: Conjunctivae normal.     Pupils: Pupils are equal, round, and reactive to light.  Neck:     Thyroid : No thyromegaly.     Vascular: No carotid bruit or JVD.  Cardiovascular:     Rate and Rhythm: Normal rate and regular rhythm.     Heart sounds: Normal heart sounds.     No  gallop.  Pulmonary:     Effort: Pulmonary effort is normal. No respiratory distress.     Breath sounds: Normal breath sounds. No wheezing or rales.  Abdominal:     General: There is no distension or abdominal bruit.     Palpations: Abdomen is soft.  Musculoskeletal:     Cervical back: Normal range of motion and neck supple.     Right lower leg: Edema present.     Left lower leg: Edema present.  Lymphadenopathy:     Cervical: No cervical adenopathy.  Skin:    General: Skin is warm and dry.     Coloration: Skin is not pale.     Findings: No rash.  Neurological:     Mental Status: She is alert.     Cranial Nerves: No cranial nerve deficit.     Coordination: Coordination normal.     Deep Tendon Reflexes: Reflexes are normal and symmetric. Reflexes normal.     Comments: Left hand weakness-baseline  Some generalized weakness  Stable gait with cane   Psychiatric:        Mood and Affect: Mood normal.           Assessment & Plan:   Problem List Items Addressed This Visit       Cardiovascular and Mediastinum   Essential hypertension - Primary (Chronic)   bp in fair control at this time (good for her-on 2nd check) BP Readings from Last 1 Encounters:  01/24/24 (!) 141/80   No changes needed Hydralazine  was increase to TID from Dr Letvak but she could not remember to take it tid so continued bid (also in past does not tolerate tid)   Most recent labs reviewed  Disc lifstyle change with low sodium diet and exercise   Lab today  Continue Hydralazine  10 mg bid Amlodipine  10 mg daily   No arb due to K  No hydrochlorothiazide  due to side effects  Follow up next week        Relevant Orders   TSH   Lipid Panel   Comprehensive metabolic panel with GFR   CBC with Differential/Platelet     Genitourinary   Stage 3b chronic kidney disease (CKD) (HCC)   Feeling more tired and weak lately  Suspect fluid intake not optimal   Lab today incl urinalysis  Follow up next  week       Relevant Orders   Comprehensive metabolic panel with GFR   POCT Urinalysis Dipstick (Automated) (Completed)     Other   Hyperlipidemia LDL goal <130 (Chronic)   Labs today  Disc goals for lipids and reasons to control them Rev last labs with pt Rev low sat fat diet in detail  Continues pravastatin  20 mg (does not tolerate higher dose)       Relevant Orders   Lipid Panel   Comprehensive metabolic panel with GFR   Weakness   Generalized/ongoing /chronic        Prediabetes   A1c today  Follow up next wk disc imp of low glycemic diet and wt loss to prevent DM2  May need more protein        Relevant Orders   Hemoglobin A1c   Poor balance   Leukocytosis   Cbc today  Has seen hematology  ? Reactive in past       Relevant Orders   CBC with Differential/Platelet   Fatigue   Fatigue/malaise with chronic poor balance and falls  At times she over does it (still lives alone with son next door)   Lab today  Urinalysis today  Follow up next week   Discussed importance of regular protein intake - given list  Also needs better fluids       Relevant Orders   TSH   CBC with Differential/Platelet   POCT Urinalysis Dipstick (Automated) (Completed)   Vitamin B12

## 2024-01-24 NOTE — Assessment & Plan Note (Signed)
 A1c today   Follow up next wk disc imp of low glycemic diet and wt loss to prevent DM2  May need more protein

## 2024-01-24 NOTE — Assessment & Plan Note (Signed)
 Feeling more tired and weak lately  Suspect fluid intake not optimal   Lab today incl urinalysis  Follow up next week

## 2024-01-24 NOTE — Assessment & Plan Note (Signed)
 Labs today  Disc goals for lipids and reasons to control them Rev last labs with pt Rev low sat fat diet in detail  Continues pravastatin  20 mg (does not tolerate higher dose)

## 2024-01-24 NOTE — Assessment & Plan Note (Signed)
 Generalized/ongoing /chronic

## 2024-01-24 NOTE — Addendum Note (Signed)
 Addended by: Deri Fleet A on: 01/24/2024 08:40 PM   Modules accepted: Orders

## 2024-01-24 NOTE — Assessment & Plan Note (Signed)
 Cbc today  Has seen hematology  ? Reactive in past

## 2024-01-24 NOTE — Assessment & Plan Note (Signed)
 Fatigue/malaise with chronic poor balance and falls  At times she over does it (still lives alone with son next door)   Lab today  Urinalysis today  Follow up next week   Discussed importance of regular protein intake - given list  Also needs better fluids

## 2024-01-24 NOTE — Patient Instructions (Addendum)
 Stay on the hydralazine  twice daily   Blood pressure was better on the 2nd check    Labs today and urine test  Follow up next week for re check and we will review llabs and make a plan    Take care of yourself  Make sure to eat regularly   Get protein with every meal The following are examples of protein in diet  Meat  Fish  Eggs  Dairy products  Soy products  Legume  Oat milk  Almond milk Nuts and nut butters  Dried beans    Make sure you drink fluids also   Stop up front to make an appointment next week

## 2024-01-25 ENCOUNTER — Other Ambulatory Visit (INDEPENDENT_AMBULATORY_CARE_PROVIDER_SITE_OTHER)

## 2024-01-25 DIAGNOSIS — E875 Hyperkalemia: Secondary | ICD-10-CM

## 2024-01-25 LAB — URINE CULTURE
MICRO NUMBER:: 16383023
SPECIMEN QUALITY:: ADEQUATE

## 2024-01-26 ENCOUNTER — Encounter: Payer: Self-pay | Admitting: Family Medicine

## 2024-01-26 LAB — BASIC METABOLIC PANEL WITH GFR
BUN: 24 mg/dL — ABNORMAL HIGH (ref 6–23)
CO2: 30 meq/L (ref 19–32)
Calcium: 9.7 mg/dL (ref 8.4–10.5)
Chloride: 100 meq/L (ref 96–112)
Creatinine, Ser: 1.08 mg/dL (ref 0.40–1.20)
GFR: 44.46 mL/min — ABNORMAL LOW (ref >60.00)
Glucose, Bld: 95 mg/dL (ref 70–99)
Potassium: 5.3 meq/L — ABNORMAL HIGH (ref 3.5–5.1)
Sodium: 140 meq/L (ref 135–145)

## 2024-01-27 ENCOUNTER — Ambulatory Visit

## 2024-01-27 VITALS — BP 141/80 | Ht 61.2 in | Wt 130.0 lb

## 2024-01-27 DIAGNOSIS — Z0000 Encounter for general adult medical examination without abnormal findings: Secondary | ICD-10-CM | POA: Diagnosis not present

## 2024-01-27 NOTE — Patient Instructions (Signed)
 Linda Robinson , Thank you for taking time to come for your Medicare Wellness Visit. I appreciate your ongoing commitment to your health goals. Please review the following plan we discussed and let me know if I can assist you in the future.   Referrals/Orders/Follow-Ups/Clinician Recommendations: follow up in 1 year as scheduled.  This is a list of the screening recommended for you and due dates:  Health Maintenance  Topic Date Due   DTaP/Tdap/Td vaccine (3 - Tdap) 02/09/2022   COVID-19 Vaccine (3 - 2024-25 season) 05/30/2023   Mammogram  06/12/2030*   Flu Shot  04/28/2024   Medicare Annual Wellness Visit  01/26/2025   Pneumonia Vaccine  Completed   DEXA scan (bone density measurement)  Completed   Zoster (Shingles) Vaccine  Completed   HPV Vaccine  Aged Out   Meningitis B Vaccine  Aged Out  *Topic was postponed. The date shown is not the original due date.    Advanced directives: (Copy Requested) Please bring a copy of your health care power of attorney and living will to the office to be added to your chart at your convenience. You can mail to [facility] [address] [place], [place] [address] or email to [email or web address]  Next Medicare Annual Wellness Visit scheduled for next year: Yes

## 2024-01-27 NOTE — Progress Notes (Signed)
 Because this visit was a virtual/telehealth visit,  certain criteria was not obtained, such a blood pressure, CBG if applicable, and timed get up and go. Any medications not marked as "taking" were not mentioned during the medication reconciliation part of the visit. Any vitals not documented were not able to be obtained due to this being a telehealth visit or patient was unable to self-report a recent blood pressure reading due to a lack of equipment at home via telehealth. Vitals that have been documented are verbally provided by the patient.  Subjective:   Linda Robinson is a 89 y.o. who presents for a Medicare Wellness preventive visit.  Visit Complete: Virtual I connected with  Linda Robinson on 01/27/24 by a audio enabled telemedicine application and verified that I am speaking with the correct person using two identifiers.  Patient Location: Home  Provider Location: Home Office  I discussed the limitations of evaluation and management by telemedicine. The patient expressed understanding and agreed to proceed.  Vital Signs: Because this visit was a virtual/telehealth visit, some criteria may be missing or patient reported. Any vitals not documented were not able to be obtained and vitals that have been documented are patient reported.  VideoDeclined- This patient declined Librarian, academic. Therefore the visit was completed with audio only.  Persons Participating in Visit: Patient.  AWV Questionnaire: No: Patient Medicare AWV questionnaire was not completed prior to this visit.  Cardiac Risk Factors include: advanced age (>69men, >46 women);hypertension     Objective:    Today's Vitals   01/27/24 1145  BP: (!) 141/80  Weight: 130 lb (59 kg)  Height: 5' 1.2" (1.554 m)   Body mass index is 24.4 kg/m.     01/27/2024   11:45 AM 01/05/2023    2:10 PM 06/11/2022    3:21 PM 03/05/2022    2:25 PM 02/12/2022    2:52 PM 10/17/2021   12:03 PM 09/12/2020     9:48 AM  Advanced Directives  Does Patient Have a Medical Advance Directive? Yes No No Yes Yes Yes No  Type of Estate agent of [place];Living will     Healthcare Power of Attorney   Does patient want to make changes to medical advance directive? No - Patient declined        Copy of Healthcare Power of Attorney in Chart? No - copy requested        Would patient like information on creating a medical advance directive?  No - Patient declined No - Patient declined    No - Patient declined    Current Medications (verified) Outpatient Encounter Medications as of 01/27/2024  Medication Sig   acetaminophen  (TYLENOL ) 650 MG CR tablet Take 650 mg by mouth in the morning, at noon, and at bedtime.   amLODipine  (NORVASC ) 10 MG tablet TAKE 1 TABLET DAILY   aspirin  EC 81 MG tablet Take 1 tablet (81 mg total) by mouth daily.   Cranberry 450 MG TABS Take 1 capsule by mouth daily.   docusate sodium  (COLACE) 100 MG capsule Take 1-2 capsules (100-200 mg total) by mouth 2 (two) times daily. 100 mg in the morning and 200 mg at night   famotidine  (PEPCID ) 20 MG tablet TAKE 1 TABLET TWICE A DAY   fish oil-omega-3 fatty acids 1000 MG capsule Take 1 g by mouth at bedtime.   furosemide  (LASIX ) 20 MG tablet Take 20 mg by mouth as needed.   hydrALAZINE  (APRESOLINE ) 10 MG tablet Take  1 tablet (10 mg total) by mouth 3 (three) times daily. (Patient taking differently: Take 10 mg by mouth in the morning and at bedtime.)   Magnesium  250 MG TABS Take 1 tablet by mouth daily.   pravastatin  (PRAVACHOL ) 20 MG tablet TAKE 1 TABLET DAILY   sertraline  (ZOLOFT ) 100 MG tablet TAKE 1.5 TABLETS (150MG  TOTAL) BY MOUTH DAILY   Tetrahydrozoline HCl (EYE DROPS OP) Apply 1-2 drops to eye as needed (for dry eye).   No facility-administered encounter medications on file as of 01/27/2024.    Allergies (verified) Ace inhibitors, Bactrim  [sulfamethoxazole -trimethoprim ], Cephalosporins, Colesevelam, Hctz  [hydrochlorothiazide ], Losartan , Lovastatin, Lyrica  [pregabalin ], Raloxifene, Rosuvastatin, and Simvastatin   History: Past Medical History:  Diagnosis Date   Ankylosis of lumbar spine    Basal cell carcinoma    removed   Cervical spine pain    Chest pain    a. 02/2017 MV: EF 54%, small, distal anterior wall/apical infacrt (likely misregistration), no ischemia-->Low risk; b. 02/2017 Echo: EF 55-60%, mild MR, PASP .   Chronic back pain    stenosis   Constipation    takes Colace daily   Depression    takes Zoloft  daily   Diverticulosis of colon    GERD (gastroesophageal reflux disease)    atypical chest pain   Gout    hx of;was on Allopurinol but taken off 6 wks ago by medical MD   History of diverticulitis of colon    HLD (hyperlipidemia)    takes Pravastatin  daily   HTN (hypertension)    takes Amlodipine  and carvedilol    Lumbar spondylosis with myelopathy    Numbness    OA (osteoarthritis)    right knee   Osteopenia    takes Calcium and Vit D daily   Scoliosis    Vertigo    takes Antivert  daily as needed   Past Surgical History:  Procedure Laterality Date   APPENDECTOMY  at age 26   BACK SURGERY     CATARACT EXTRACTION Right 04/2006   CHOLECYSTECTOMY  1970   COLONOSCOPY     KNEE SURGERY Right    Medial meniscus tear   LUMBAR LAMINECTOMY/DECOMPRESSION MICRODISCECTOMY Bilateral 11/13/2014   Procedure: Lumbar one-two Bilateral laminectomy and foraminotomy, Left Lumbar one-two microdiscectomy ;  Surgeon: Augustine Blocker, MD;  Location: [facility];  Service: Neurosurgery;  Laterality: Bilateral;   NECK SURGERY  [phone]   x 3; fusions   PARTIAL COLECTOMY  09/1999   d/t diverticulitis   TUBAL LIGATION     Family History  Problem Relation Age of Onset   Cancer Father        throat and bladder   Diabetes Father    Hypertension Father    Heart attack Father    Stroke Mother    Alzheimer's disease Mother    Hypertension Mother    Social History    Socioeconomic History   Marital status: Widowed    Spouse name: Not on file   Number of children: 3   Years of education: 14   Highest education level: Associate degree: occupational, Scientist, product/process development, or vocational program  Occupational History   Occupation: Retired  Tobacco Use   Smoking status: Never   Smokeless tobacco: Never  Vaping Use   Vaping status: Never Used  Substance and Sexual Activity   Alcohol  use: Never    Alcohol /week: 0.0 standard drinks of alcohol    Drug use: Never   Sexual activity: Not Currently    Birth control/protection: Post-menopausal  Other Topics Concern  Not on file  Social History Narrative   Lives alone   Coffee 1/2 c   Social Drivers of Health   Financial Resource Strain: Low Risk  (01/27/2024)   Overall Financial Resource Strain (CARDIA)    Difficulty of Paying Living Expenses: Not hard at all  Food Insecurity: No Food Insecurity (01/27/2024)   Hunger Vital Sign    Worried About Running Out of Food in the Last Year: Never true    Ran Out of Food in the Last Year: Never true  Transportation Needs: No Transportation Needs (01/27/2024)   PRAPARE - Administrator, Civil Service (Medical): No    Lack of Transportation (Non-Medical): No  Physical Activity: Inactive (01/27/2024)   Exercise Vital Sign    Days of Exercise per Week: 0 days    Minutes of Exercise per Session: 0 min  Stress: No Stress Concern Present (01/27/2024)   [employer] of Occupational Health - Occupational Stress Questionnaire    Feeling of Stress : Only a little  Social Connections: Socially Isolated (01/27/2024)   Social Connection and Isolation Panel [NHANES]    Frequency of Communication with Friends and Family: More than three times a week    Frequency of Social Gatherings with Friends and Family: Twice a week    Attends Religious Services: Never    Database administrator or Organizations: No    Attends Banker Meetings: Never    Marital Status:  Widowed    Tobacco Counseling Counseling given: Not Answered    Clinical Intake:  Pre-visit preparation completed: Yes  Pain : No/denies pain     BMI - recorded: 24.4 Nutritional Status: BMI of 19-24  Normal Nutritional Risks: None Diabetes: No  Lab Results  Component Value Date   HGBA1C 5.6 01/24/2024   HGBA1C 5.6 01/26/2023   HGBA1C 5.5 02/03/2022     How often do you need to have someone help you when you read instructions, pamphlets, or other written materials from your doctor or pharmacy?: 1 - Never What is the last grade level you completed in school?: Some college  Interpreter Needed?: No  Information entered by :: [employer]   Activities of Daily Living     01/27/2024   11:50 AM 01/05/2024    4:20 PM  In your present state of health, do you have any difficulty performing the following activities:  Hearing? 1 1  Comment deaf in one year and hearing aids   Vision? 0 0  Difficulty concentrating or making decisions? 0 0  Walking or climbing stairs? 1 1  Dressing or bathing? 0 0  Doing errands, shopping? 1 1  Preparing Food and eating ? N N  Using the Toilet? N N  In the past six months, have you accidently leaked urine? Y Y  Comment sometimes   Do you have problems with loss of bowel control? N N  Managing your Medications? N N  Managing your Finances? N N  Housekeeping or managing your Housekeeping? N N    Patient Care Team: Tower, Manley Seeds, MD as PCP - General Cloteal Daniels, MD as Consulting Physician (Orthopedic Surgery)  Indicate any recent Medical Services you may have received from other than [facility] providers in the past year (date may be approximate).     Assessment:   This is a routine wellness examination for Linda Robinson.  Hearing/Vision screen Hearing Screening - Comments:: Patient states she has hearing aid and deaf in 1 year Vision Screening -  Comments:: Wears glasses    Goals Addressed             This Visit's Progress     Patient Stated       To stay in her own home        Depression Screen     01/27/2024   11:52 AM 01/24/2024   11:28 AM 06/11/2023   11:56 AM 02/02/2023   11:37 AM 01/05/2023    2:09 PM 10/29/2022   10:55 AM 10/12/2022   11:24 AM  PHQ 2/9 Scores  PHQ - 2 Score 0 1 2 1  0 2 2  PHQ- 9 Score 3 4 2 6  5 8     Fall Risk     01/27/2024   11:48 AM 01/24/2024   11:28 AM 01/05/2024    4:20 PM 06/11/2023   11:52 AM 02/02/2023   11:36 AM  Fall Risk   Falls in the past year? 1 1 1 1 1   Number falls in past yr: 0 1 1 0 0  Injury with Fall? 0 0 0 1 0  Risk for fall due to : No Fall Risks History of fall(s)  History of fall(s);Impaired balance/gait;Impaired mobility History of fall(s)  Follow up Falls prevention discussed;Falls evaluation completed Falls evaluation completed  Falls evaluation completed Falls evaluation completed    MEDICARE RISK AT HOME:  Medicare Risk at Home Any stairs in or around the home?: No If so, are there any without handrails?: No Home free of loose throw rugs in walkways, pet beds, electrical cords, etc?: Yes Adequate lighting in your home to reduce risk of falls?: Yes Life alert?: Yes Use of a cane, walker or w/c?: Yes (cane and walker) Grab bars in the bathroom?: Yes Shower chair or bench in shower?: Yes Elevated toilet seat or a handicapped toilet?: Yes  TIMED UP AND GO:  Was the test performed?  No  Cognitive Function: 6CIT completed    06/05/2020   12:08 PM 05/26/2019   11:28 AM 05/16/2018   10:58 AM 05/11/2017   12:10 PM 07/07/2016    9:30 AM  MMSE - Mini Mental State Exam  Not completed: Refused      Orientation to time  4 5 5 5   Orientation to Place  5 5 5 5   Registration  3 3 3 3   Attention/ Calculation  5 0 0 0  Recall  2 3 3 3   Language- name 2 objects  0 0 0 0  Language- repeat  1 1 1 1   Language- follow 3 step command  0 3 3 3   Language- read & follow direction  0 0 0 0  Write a sentence  0 0 0 0  Copy design  0 0 0 0  Total score  20 20 20 20          01/27/2024   11:47 AM 01/05/2023    2:14 PM  6CIT Screen  What Year? 0 points 0 points  What month? 0 points 0 points  What time? 0 points 0 points  Count back from 20 0 points 0 points  Months in reverse 0 points 0 points  Repeat phrase 0 points 0 points  Total Score 0 points 0 points    Immunizations Immunization History  Administered Date(s) Administered   Fluad Quad(high Dose 65+) 06/22/2022   Influenza Split 07/23/2011, 07/01/2017   Influenza Whole 08/11/2004, 07/08/2007, 07/09/2008, 06/19/2009, 07/08/2010, 07/12/2020   Influenza, High Dose Seasonal PF 07/31/2015, 06/30/2016, 07/01/2017, 06/13/2018, 05/15/2019,  07/27/2021, 08/22/2023   Influenza-Unspecified 06/28/2013, 08/05/2015   Moderna Sars-Covid-2 Vaccination 12/24/2019, 01/21/2020   PPD Test 10/03/2015   Pneumococcal Conjugate-13 06/27/2014   Pneumococcal Polysaccharide-23 02/10/2012   Td 08/28/1997, 02/10/2012   Zoster Recombinant(Shingrix) 08/22/2023, 10/22/2023    Screening Tests Health Maintenance  Topic Date Due   DTaP/Tdap/Td (3 - Tdap) 02/09/2022   COVID-19 Vaccine (3 - 2024-25 season) 05/30/2023   MAMMOGRAM  06/12/2030 (Originally 06/09/2019)   INFLUENZA VACCINE  04/28/2024   Medicare Annual Wellness (AWV)  01/26/2025   Pneumonia Vaccine 68+ Years old  Completed   DEXA SCAN  Completed   Zoster Vaccines- Shingrix  Completed   HPV VACCINES  Aged Out   Meningococcal B Vaccine  Aged Out    Health Maintenance  Health Maintenance Due  Topic Date Due   DTaP/Tdap/Td (3 - Tdap) 02/09/2022   COVID-19 Vaccine (3 - 2024-25 season) 05/30/2023   Health Maintenance Items Addressed:   Additional Screening:  Vision Screening: Recommended annual ophthalmology exams for early detection of glaucoma and other disorders of the eye.  Dental Screening: Recommended annual dental exams for proper oral hygiene  Community Resource Referral / Chronic Care Management: CRR required this visit?  No   CCM  required this visit?  No     Plan:     I have personally reviewed and noted the following in the patient's chart:   Medical and social history Use of alcohol , tobacco or illicit drugs  Current medications and supplements including opioid prescriptions. Patient is not currently taking opioid prescriptions. Functional ability and status Nutritional status Physical activity Advanced directives List of other physicians Hospitalizations, surgeries, and ER visits in previous 12 months Vitals Screenings to include cognitive, depression, and falls Referrals and appointments  In addition, I have reviewed and discussed with patient certain preventive protocols, quality metrics, and best practice recommendations. A written personalized care plan for preventive services as well as general preventive health recommendations were provided to patient.     Freeda Jerry, [place]   01/27/2024   After Visit Summary: (MyChart) Due to this being a telephonic visit, the after visit summary with patients personalized plan was offered to patient via MyChart   Notes: Nothing significant to report at this time.

## 2024-01-30 LAB — ALDOSTERONE + RENIN ACTIVITY W/ RATIO
ALDO / PRA Ratio: 10.5 ratio (ref 0.9–28.9)
Aldosterone: 8 ng/dL
Renin Activity: 0.76 ng/mL/h (ref 0.25–5.82)

## 2024-01-31 ENCOUNTER — Ambulatory Visit: Admitting: Family Medicine

## 2024-01-31 ENCOUNTER — Telehealth: Payer: Self-pay

## 2024-01-31 NOTE — Telephone Encounter (Signed)
-----   Message from [facility] sent at 01/30/2024  4:42 PM EDT ----- Aldosterone /renin tests are ok / reassuring  Will review with patient in detail at follow up

## 2024-01-31 NOTE — Telephone Encounter (Signed)
 Called and spoke with pt. Reviewed Results of latest labs done. Pt acknowledged understanding and confirmed next appt. No further questions.

## 2024-02-01 ENCOUNTER — Ambulatory Visit (INDEPENDENT_AMBULATORY_CARE_PROVIDER_SITE_OTHER): Admitting: Family Medicine

## 2024-02-01 VITALS — BP 122/68 | HR 67 | Temp 97.8°F | Ht 61.5 in | Wt 128.7 lb

## 2024-02-01 DIAGNOSIS — I10 Essential (primary) hypertension: Secondary | ICD-10-CM

## 2024-02-01 DIAGNOSIS — E875 Hyperkalemia: Secondary | ICD-10-CM

## 2024-02-01 DIAGNOSIS — R5382 Chronic fatigue, unspecified: Secondary | ICD-10-CM

## 2024-02-01 DIAGNOSIS — Z7409 Other reduced mobility: Secondary | ICD-10-CM

## 2024-02-01 DIAGNOSIS — E785 Hyperlipidemia, unspecified: Secondary | ICD-10-CM | POA: Diagnosis not present

## 2024-02-01 DIAGNOSIS — F418 Other specified anxiety disorders: Secondary | ICD-10-CM

## 2024-02-01 DIAGNOSIS — R2689 Other abnormalities of gait and mobility: Secondary | ICD-10-CM | POA: Diagnosis not present

## 2024-02-01 DIAGNOSIS — N1832 Chronic kidney disease, stage 3b: Secondary | ICD-10-CM | POA: Diagnosis not present

## 2024-02-01 DIAGNOSIS — R8290 Unspecified abnormal findings in urine: Secondary | ICD-10-CM | POA: Diagnosis not present

## 2024-02-01 NOTE — Assessment & Plan Note (Signed)
 This improved on re check Some hemolysis noted Lab Results  Component Value Date   K 5.3 Slightly hemolyse.. (H) 01/25/2024    Continue to monitor Re check in a month

## 2024-02-01 NOTE — Assessment & Plan Note (Signed)
 Urine culture was normal /not positive

## 2024-02-01 NOTE — Assessment & Plan Note (Signed)
 Disc goals for lipids and reasons to control them Rev last labs with pt Rev low sat fat diet in detail LDL is up Per pt no missed pravastatin   At her age-unlikely to make changes

## 2024-02-01 NOTE — Assessment & Plan Note (Signed)
 In setting of stressors Advised age Chronic pain  Family stresses   Continues sertraline150 mg daily  Has helped some Declines mental health counseling   I think anxiety adds to her feeling of weakness at times

## 2024-02-01 NOTE — Assessment & Plan Note (Signed)
 --  HHPT ordered  ?

## 2024-02-01 NOTE — Assessment & Plan Note (Signed)
 Multifactorial  Some improvement today  Labs reviewed  Mood may add

## 2024-02-01 NOTE — Assessment & Plan Note (Signed)
 Due to chronic painand weakness and poor balance  HH for PT ordered

## 2024-02-01 NOTE — Assessment & Plan Note (Signed)
 bp in fair control at this time /improved  BP Readings from Last 1 Encounters:  02/01/24 122/68   No changes needed Most recent labs reviewed  Disc lifstyle change with low sodium diet and exercise  Hydralazine  10 mg bid (does not tolerate tid) Amlodipine  10 mg daily  Lasix  20 mg as needed for edema

## 2024-02-01 NOTE — Patient Instructions (Addendum)
 Make sure to eat regularly  Get protein with every meal  Stay hydrated   If your kidney function worsens I will have you see a kidney doctor  Let's re check kidney labs in a month   Really focus on fluid intake   The following are examples of protein in diet  Meat  Fish  Eggs  Dairy products  Soy products  Oat milk  Almond milk Nuts and nut butters  Legumes Also protein drinks - like ensure and boost and premiere protein drinks  Also protein powder that you can add to other things you make    Dried beans    Let's try to get home health to come out and give you some physical therapy  I put the referral in  Please let us  know if you don't hear in 1-2 weeks    If symptoms change let me know

## 2024-02-01 NOTE — Progress Notes (Signed)
 Subjective:    Patient ID: Linda Robinson, female    DOB: June 09, 1929, 89 y.o.   MRN: [id number]  HPI  Wt Readings from Last 3 Encounters:  02/01/24 128 lb 11.2 oz (58.4 kg)  01/27/24 130 lb (59 kg)  01/24/24 131 lb (59.4 kg)   23.92 kg/m  Vitals:   02/01/24 1109  BP: 122/68  Pulse: 67  Temp: 97.8 F (36.6 C)  SpO2: 97%   Pt presents for follow up of HTN and chronic health problems  Also fatigue  Gait change  Ckd - had elevated K which improved  Feels "real weak" and does not know why   HTN bp is improved today  No cp or palpitations or headaches or edema  No side effects to medicines  BP Readings from Last 3 Encounters:  02/01/24 122/68  01/27/24 (!) 141/80  01/24/24 (!) 141/80    Pulse Readings from Last 3 Encounters:  02/01/24 67  01/24/24 (!) 56  12/16/23 78   Hydralazine  10 mg bid (does not tolerate tid) Amlodipine  10 mg daily  Lasix  20 mg as needed for edema   Lab Results  Component Value Date   NA 140 01/25/2024   K 5.3 Slightly hemolyse.. (H) 01/25/2024   CO2 30 01/25/2024   GLUCOSE 95 01/25/2024   BUN 24 (H) 01/25/2024   CREATININE 1.08 01/25/2024   CALCIUM 9.7 01/25/2024   GFR 44.46 (L) 01/25/2024   GFRNONAA 41 (L) 06/11/2022   K went up to 5.8 , then on re check 5.3 and some hemolysis Did renin and angiotensin levels-ok  Does not take ace or arb   Drinks a powdered tea -has some potassium   Lab Results  Component Value Date   ALT 9 01/24/2024   AST 18 01/24/2024   ALKPHOS 61 01/24/2024   BILITOT 0.6 01/24/2024   Prediabetes  Lab Results  Component Value Date   HGBA1C 5.6 01/24/2024   Lab Results  Component Value Date   WBC 9.4 01/24/2024   HGB 13.9 01/24/2024   HCT 42.7 01/24/2024   MCV 91.5 01/24/2024   PLT 273.0 01/24/2024   Lab Results  Component Value Date   VITAMINB12 664 01/24/2024   Hyperlipidemia Lab Results  Component Value Date   CHOL 250 (H) 01/24/2024   HDL 75.40 01/24/2024   LDLCALC 155 (H)  01/24/2024   LDLDIRECT 135.4 04/11/2013   TRIG 98.0 01/24/2024   CHOLHDL 3 01/24/2024   Pravastatin  20 mg daily  Diet not changed  Eating ok   Some strawberry short cake  Making effort to get protein   Uses walker full time at home  [place] here today with help  Poor balance   Has a scooter- learning how to use it  Trying to walk more in the house   Does not want to move from your home  Talks on the phone  Family spend Sunday afternoon with her    Lab Results  Component Value Date   COLORU Yellow 01/24/2024   CLARITYU Clear 01/24/2024   GLUCOSEUR Negative 01/24/2024   BILIRUBINUR Negative 01/24/2024   KETONESU Negative 01/24/2024   SPECGRAV 1.015 01/24/2024   RBCUR 25 Ery/uL (A) 01/24/2024   PHUR 7.5 01/24/2024   PROTEINUR Positive (A) 01/24/2024   UROBILINOGEN 0.2 01/24/2024   LEUKOCYTESUR Trace (A) 01/24/2024  Urine culture was negative        01/27/2024   11:52 AM 01/24/2024   11:28 AM 06/11/2023   11:56 AM 02/02/2023  11:37 AM 01/05/2023    2:09 PM  Depression screen PHQ 2/9  Decreased Interest 0 1 1 0 0  Down, Depressed, Hopeless 0 0 1 1 0  PHQ - 2 Score 0 1 2 1  0  Altered sleeping 0 0 0 0   Tired, decreased energy 3 3 0 1   Change in appetite 0 0 0 1   Feeling bad or failure about yourself  0 0 0 0   Trouble concentrating 0 0 0 0   Moving slowly or fidgety/restless 0 0 0 3   Suicidal thoughts 0 0 0 0   PHQ-9 Score 3 4 2 6    Difficult doing work/chores Not difficult at all Not difficult at all Not difficult at all Not difficult at all       01/24/2024   11:28 AM 02/02/2023   11:37 AM 10/29/2022   10:56 AM 10/12/2022   11:24 AM  GAD 7 : Generalized Anxiety Score  Nervous, Anxious, on Edge  0 1 1  Control/stop worrying 1 0 1 1  Worry too much - different things 1 0 1 1  Trouble relaxing 0 0 1 0  Restless 0 0 0 0  Easily annoyed or irritable 0 0 0 0  Afraid - awful might happen 0 0 0 0  Total GAD 7 Score  0 4 3  Anxiety Difficulty Not difficult at all  Not difficult at all Not difficult at all    Has to have someone take her places and do her shopping Is frustrated      Patient Active Problem List   Diagnosis Date Noted   Fatigue 01/24/2024   Abnormal urinalysis 01/24/2024   Acute cystitis without hematuria 11/01/2023   Fall at home 06/11/2023   Coccyx pain 06/11/2023   Mobility impaired 03/02/2023   Knee pain 10/12/2022   Hyperkalemia 07/03/2022   Falls frequently 03/10/2022   Osteoarthritis of both knees 10/29/2021   Lower abdominal pain 07/23/2021   Diverticulosis 07/23/2021   Cervical radicular pain (left) 09/12/2020   Cervical disc disorder with radiculopathy of cervical region 09/12/2020   Cervical spondylosis 09/12/2020   Cervical fusion syndrome (C4-T1 ACDF) 09/12/2020   Neuropathic pain of hand, left 09/12/2020   Chronic pain syndrome 09/12/2020   Failed neck syndrome 07/30/2020   Left hand pain 07/30/2020   Weakness 05/01/2020   Grief reaction 11/17/2019   Cervical stenosis of spinal canal 11/17/2019   Hearing loss 11/17/2019   Poor balance 06/12/2019   Screening mammogram, encounter for 05/23/2018   Substernal chest pain 08/16/2017   Atherosclerosis of aorta (HCC) 03/09/2017   Weight loss 02/23/2017   Routine general medical examination at a health care facility 07/05/2015   Estrogen deficiency 07/05/2015   Prediabetes 01/01/2015   Lumbar spinal stenosis 11/13/2014   History of vertigo 10/05/2014   Leukocytosis 07/16/2014   Risk for falls 06/27/2014   Right-sided low back pain with sciatica 07/24/2013   Edema 04/26/2013   Encounter for Medicare annual wellness exam 04/11/2013   Lymphocytosis 01/05/2012   GERD (gastroesophageal reflux disease)    Osteopenia 01/30/2009   Constipation 11/01/2007   Depression with anxiety 02/14/2007   Hyperlipidemia LDL goal <130 02/04/2007   Gout 02/04/2007   Essential hypertension 02/04/2007   DIVERTICULOSIS, COLON 02/04/2007   Stage 3b chronic kidney disease (CKD)  (HCC) 02/04/2007   OSTEOARTHRITIS 02/04/2007   DEGENERATIVE DISC DISEASE 02/04/2007   Past Medical History:  Diagnosis Date   Ankylosis of lumbar spine  Basal cell carcinoma    removed   Cervical spine pain    Chest pain    a. 02/2017 MV: EF 54%, small, distal anterior wall/apical infacrt (likely misregistration), no ischemia-->Low risk; b. 02/2017 Echo: EF 55-60%, mild MR, PASP .   Chronic back pain    stenosis   Constipation    takes Colace daily   Depression    takes Zoloft  daily   Diverticulosis of colon    GERD (gastroesophageal reflux disease)    atypical chest pain   Gout    hx of;was on Allopurinol but taken off 6 wks ago by medical MD   History of diverticulitis of colon    HLD (hyperlipidemia)    takes Pravastatin  daily   HTN (hypertension)    takes Amlodipine  and carvedilol    Lumbar spondylosis with myelopathy    Numbness    OA (osteoarthritis)    right knee   Osteopenia    takes Calcium and Vit D daily   Scoliosis    Vertigo    takes Antivert  daily as needed   Past Surgical History:  Procedure Laterality Date   APPENDECTOMY  at age 65   BACK SURGERY     CATARACT EXTRACTION Right 04/2006   CHOLECYSTECTOMY  1970   COLONOSCOPY     KNEE SURGERY Right    Medial meniscus tear   LUMBAR LAMINECTOMY/DECOMPRESSION MICRODISCECTOMY Bilateral 11/13/2014   Procedure: Lumbar one-two Bilateral laminectomy and foraminotomy, Left Lumbar one-two microdiscectomy ;  Surgeon: Augustine Blocker, MD;  Location: [facility];  Service: Neurosurgery;  Laterality: Bilateral;   NECK SURGERY  [phone]   x 3; fusions   PARTIAL COLECTOMY  09/1999   d/t diverticulitis   TUBAL LIGATION     Social History   Tobacco Use   Smoking status: Never   Smokeless tobacco: Never  Vaping Use   Vaping status: Never Used  Substance Use Topics   Alcohol  use: Never    Alcohol /week: 0.0 standard drinks of alcohol    Drug use: Never   Family History  Problem Relation Age of Onset    Cancer Father        throat and bladder   Diabetes Father    Hypertension Father    Heart attack Father    Stroke Mother    Alzheimer's disease Mother    Hypertension Mother    Allergies  Allergen Reactions   Ace Inhibitors     REACTION: increased K+, increased creat.   Bactrim  [Sulfamethoxazole -Trimethoprim ]     rash   Cephalosporins     REACTION: reaction not known   Colesevelam     REACTION: constipation   Hctz [Hydrochlorothiazide ]     Makes her feel "bad"   Losartan      Increased K   Lovastatin     REACTION: doesn't work   Lyrica  [Pregabalin ]     Felt bad    Raloxifene     REACTION: cramps   Rosuvastatin     REACTION: myalgia   Simvastatin     REACTION: myalgia   Current Outpatient Medications on File Prior to Visit  Medication Sig Dispense Refill   acetaminophen  (TYLENOL ) 650 MG CR tablet Take 650 mg by mouth in the morning, at noon, and at bedtime.     amLODipine  (NORVASC ) 10 MG tablet TAKE 1 TABLET DAILY 90 tablet 0   aspirin  EC 81 MG tablet Take 1 tablet (81 mg total) by mouth daily. 90 tablet 3   Cranberry 450 MG TABS Take  1 capsule by mouth daily.     docusate sodium  (COLACE) 100 MG capsule Take 1-2 capsules (100-200 mg total) by mouth 2 (two) times daily. 100 mg in the morning and 200 mg at night 180 capsule 1   famotidine  (PEPCID ) 20 MG tablet TAKE 1 TABLET TWICE A DAY 180 tablet 0   fish oil-omega-3 fatty acids 1000 MG capsule Take 1 g by mouth at bedtime.     furosemide  (LASIX ) 20 MG tablet Take 20 mg by mouth as needed.     hydrALAZINE  (APRESOLINE ) 10 MG tablet Take 1 tablet (10 mg total) by mouth 3 (three) times daily. (Patient taking differently: Take 10 mg by mouth in the morning and at bedtime.) 270 tablet 1   Magnesium  250 MG TABS Take 1 tablet by mouth daily.     pravastatin  (PRAVACHOL ) 20 MG tablet TAKE 1 TABLET DAILY 90 tablet 0   sertraline  (ZOLOFT ) 100 MG tablet TAKE 1.5 TABLETS (150MG  TOTAL) BY MOUTH DAILY 135 tablet 3   Tetrahydrozoline HCl  (EYE DROPS OP) Apply 1-2 drops to eye as needed (for dry eye).     No current facility-administered medications on file prior to visit.    Review of Systems  Constitutional:  Positive for fatigue. Negative for activity change, appetite change, fever and unexpected weight change.  HENT:  Negative for congestion, ear pain, rhinorrhea, sinus pressure and sore throat.   Eyes:  Negative for pain, redness and visual disturbance.  Respiratory:  Negative for cough, shortness of breath and wheezing.   Cardiovascular:  Negative for chest pain and palpitations.  Gastrointestinal:  Negative for abdominal pain, blood in stool, constipation and diarrhea.  Endocrine: Negative for polydipsia and polyuria.  Genitourinary:  Negative for dysuria, frequency and urgency.  Musculoskeletal:  Positive for arthralgias and back pain. Negative for myalgias.  Skin:  Negative for pallor and rash.  Allergic/Immunologic: Negative for environmental allergies.  Neurological:  Positive for weakness. Negative for dizziness, syncope and headaches.  Hematological:  Negative for adenopathy. Does not bruise/bleed easily.  Psychiatric/Behavioral:  Negative for decreased concentration and dysphoric mood. The patient is nervous/anxious.        Objective:   Physical Exam Constitutional:      General: She is not in acute distress.    Appearance: Normal appearance. She is well-developed and normal weight. She is not ill-appearing or diaphoretic.  HENT:     Head: Normocephalic and atraumatic.  Eyes:     Conjunctiva/sclera: Conjunctivae normal.     Pupils: Pupils are equal, round, and reactive to light.  Neck:     Thyroid : No thyromegaly.     Vascular: No carotid bruit or JVD.  Cardiovascular:     Rate and Rhythm: Normal rate and regular rhythm.     Heart sounds: Normal heart sounds.     No gallop.  Pulmonary:     Effort: Pulmonary effort is normal. No respiratory distress.     Breath sounds: Normal breath sounds. No  wheezing or rales.  Abdominal:     General: There is no distension or abdominal bruit.     Palpations: Abdomen is soft.  Musculoskeletal:     Cervical back: Normal range of motion and neck supple.     Right lower leg: No edema.     Left lower leg: No edema.     Comments: Limited rom spine and knees  Cannot rise from chair w/o assit of using arms  Walks with cane- balance is generally poor   Deformity  and weakness in left hand baseline   Lymphadenopathy:     Cervical: No cervical adenopathy.  Skin:    General: Skin is warm and dry.     Coloration: Skin is not pale.     Findings: No rash.  Neurological:     Mental Status: She is alert.     Coordination: Coordination normal.     Deep Tendon Reflexes: Reflexes are normal and symmetric. Reflexes normal.     Comments: Wide based imbalanced gait   Psychiatric:        Attention and Perception: Attention normal.        Mood and Affect: Mood is anxious.        Speech: Speech normal.        Cognition and Memory: Cognition and memory normal.     Comments: Less anxious today than last visit            Assessment & Plan:   Problem List Items Addressed This Visit       Cardiovascular and Mediastinum   Essential hypertension - Primary (Chronic)   bp in fair control at this time /improved  BP Readings from Last 1 Encounters:  02/01/24 122/68   No changes needed Most recent labs reviewed  Disc lifstyle change with low sodium diet and exercise  Hydralazine  10 mg bid (does not tolerate tid) Amlodipine  10 mg daily  Lasix  20 mg as needed for edema         Genitourinary   Stage 3b chronic kidney disease (CKD) (HCC)   K 5.3 (but some hemolysis)  GFR 44.4  Encouraged fluids and nsaid avoidance Re check 1 mo  If worse consider nephrology ref         Other   Hyperlipidemia LDL goal <130 (Chronic)   Disc goals for lipids and reasons to control them Rev last labs with pt Rev low sat fat diet in detail LDL is up Per pt no  missed pravastatin   At her age-unlikely to make changes       Poor balance   HH / PT ordered       Relevant Orders   Ambulatory referral to Home Health   Mobility impaired   Due to chronic painand weakness and poor balance  HH for PT ordered       Relevant Orders   Ambulatory referral to Home Health   Hyperkalemia   This improved on re check Some hemolysis noted Lab Results  Component Value Date   K 5.3 Slightly hemolyse.. (H) 01/25/2024    Continue to monitor Re check in a month      Relevant Orders   Basic metabolic panel with GFR   Fatigue   Multifactorial  Some improvement today  Labs reviewed  Mood may add       Depression with anxiety   In setting of stressors Advised age Chronic pain  Family stresses   Continues sertraline150 mg daily  Has helped some Declines mental health counseling   I think anxiety adds to her feeling of weakness at times       Abnormal urinalysis   Urine culture was normal /not positive

## 2024-02-01 NOTE — Assessment & Plan Note (Signed)
 K 5.3 (but some hemolysis)  GFR 44.4  Encouraged fluids and nsaid avoidance Re check 1 mo  If worse consider nephrology ref

## 2024-02-10 ENCOUNTER — Other Ambulatory Visit: Payer: Self-pay | Admitting: Family Medicine

## 2024-02-11 ENCOUNTER — Telehealth: Payer: Self-pay | Admitting: *Deleted

## 2024-02-11 DIAGNOSIS — F418 Other specified anxiety disorders: Secondary | ICD-10-CM | POA: Diagnosis not present

## 2024-02-11 DIAGNOSIS — M48061 Spinal stenosis, lumbar region without neurogenic claudication: Secondary | ICD-10-CM | POA: Diagnosis not present

## 2024-02-11 DIAGNOSIS — N1832 Chronic kidney disease, stage 3b: Secondary | ICD-10-CM | POA: Diagnosis not present

## 2024-02-11 DIAGNOSIS — M170 Bilateral primary osteoarthritis of knee: Secondary | ICD-10-CM | POA: Diagnosis not present

## 2024-02-11 DIAGNOSIS — I129 Hypertensive chronic kidney disease with stage 1 through stage 4 chronic kidney disease, or unspecified chronic kidney disease: Secondary | ICD-10-CM | POA: Diagnosis not present

## 2024-02-11 DIAGNOSIS — M5441 Lumbago with sciatica, right side: Secondary | ICD-10-CM | POA: Diagnosis not present

## 2024-02-11 DIAGNOSIS — I700 Atherosclerosis of aorta: Secondary | ICD-10-CM | POA: Diagnosis not present

## 2024-02-11 DIAGNOSIS — Z9181 History of falling: Secondary | ICD-10-CM | POA: Diagnosis not present

## 2024-02-11 DIAGNOSIS — Z7982 Long term (current) use of aspirin: Secondary | ICD-10-CM | POA: Diagnosis not present

## 2024-02-11 DIAGNOSIS — N3000 Acute cystitis without hematuria: Secondary | ICD-10-CM | POA: Diagnosis not present

## 2024-02-11 DIAGNOSIS — M109 Gout, unspecified: Secondary | ICD-10-CM | POA: Diagnosis not present

## 2024-02-11 NOTE — Telephone Encounter (Signed)
Please ok those verbal orders  

## 2024-02-11 NOTE — Telephone Encounter (Signed)
VO given to [facility]

## 2024-02-11 NOTE — Telephone Encounter (Signed)
 Copied from CRM [id number]. Topic: Clinical - Home Health Verbal Orders >> Feb 11, 2024  2:42 PM Hamp Levine R wrote: Caller/Agency: Monique / [facility] Callback Number: [phone] Service Requested: Physical Therapy Frequency: 1 1 time a week for 1 week 2 times a week for 3 weeks 1 times a week for 1 week Any new concerns about the patient? No

## 2024-02-17 DIAGNOSIS — M109 Gout, unspecified: Secondary | ICD-10-CM | POA: Diagnosis not present

## 2024-02-17 DIAGNOSIS — M170 Bilateral primary osteoarthritis of knee: Secondary | ICD-10-CM | POA: Diagnosis not present

## 2024-02-17 DIAGNOSIS — M5441 Lumbago with sciatica, right side: Secondary | ICD-10-CM | POA: Diagnosis not present

## 2024-02-17 DIAGNOSIS — Z7982 Long term (current) use of aspirin: Secondary | ICD-10-CM | POA: Diagnosis not present

## 2024-02-17 DIAGNOSIS — N1832 Chronic kidney disease, stage 3b: Secondary | ICD-10-CM | POA: Diagnosis not present

## 2024-02-17 DIAGNOSIS — Z9181 History of falling: Secondary | ICD-10-CM | POA: Diagnosis not present

## 2024-02-17 DIAGNOSIS — I700 Atherosclerosis of aorta: Secondary | ICD-10-CM | POA: Diagnosis not present

## 2024-02-17 DIAGNOSIS — F418 Other specified anxiety disorders: Secondary | ICD-10-CM | POA: Diagnosis not present

## 2024-02-17 DIAGNOSIS — M48061 Spinal stenosis, lumbar region without neurogenic claudication: Secondary | ICD-10-CM | POA: Diagnosis not present

## 2024-02-17 DIAGNOSIS — N3000 Acute cystitis without hematuria: Secondary | ICD-10-CM | POA: Diagnosis not present

## 2024-02-17 DIAGNOSIS — I129 Hypertensive chronic kidney disease with stage 1 through stage 4 chronic kidney disease, or unspecified chronic kidney disease: Secondary | ICD-10-CM | POA: Diagnosis not present

## 2024-02-25 DIAGNOSIS — M170 Bilateral primary osteoarthritis of knee: Secondary | ICD-10-CM | POA: Diagnosis not present

## 2024-02-25 DIAGNOSIS — M109 Gout, unspecified: Secondary | ICD-10-CM | POA: Diagnosis not present

## 2024-02-25 DIAGNOSIS — M48061 Spinal stenosis, lumbar region without neurogenic claudication: Secondary | ICD-10-CM | POA: Diagnosis not present

## 2024-02-25 DIAGNOSIS — N3000 Acute cystitis without hematuria: Secondary | ICD-10-CM | POA: Diagnosis not present

## 2024-02-25 DIAGNOSIS — I129 Hypertensive chronic kidney disease with stage 1 through stage 4 chronic kidney disease, or unspecified chronic kidney disease: Secondary | ICD-10-CM | POA: Diagnosis not present

## 2024-02-25 DIAGNOSIS — N1832 Chronic kidney disease, stage 3b: Secondary | ICD-10-CM | POA: Diagnosis not present

## 2024-02-25 DIAGNOSIS — M5441 Lumbago with sciatica, right side: Secondary | ICD-10-CM | POA: Diagnosis not present

## 2024-02-25 DIAGNOSIS — Z7982 Long term (current) use of aspirin: Secondary | ICD-10-CM | POA: Diagnosis not present

## 2024-02-25 DIAGNOSIS — F418 Other specified anxiety disorders: Secondary | ICD-10-CM | POA: Diagnosis not present

## 2024-02-25 DIAGNOSIS — I700 Atherosclerosis of aorta: Secondary | ICD-10-CM | POA: Diagnosis not present

## 2024-02-25 DIAGNOSIS — Z9181 History of falling: Secondary | ICD-10-CM | POA: Diagnosis not present

## 2024-03-03 ENCOUNTER — Other Ambulatory Visit (INDEPENDENT_AMBULATORY_CARE_PROVIDER_SITE_OTHER)

## 2024-03-03 DIAGNOSIS — M109 Gout, unspecified: Secondary | ICD-10-CM | POA: Diagnosis not present

## 2024-03-03 DIAGNOSIS — M170 Bilateral primary osteoarthritis of knee: Secondary | ICD-10-CM | POA: Diagnosis not present

## 2024-03-03 DIAGNOSIS — F418 Other specified anxiety disorders: Secondary | ICD-10-CM | POA: Diagnosis not present

## 2024-03-03 DIAGNOSIS — N1832 Chronic kidney disease, stage 3b: Secondary | ICD-10-CM | POA: Diagnosis not present

## 2024-03-03 DIAGNOSIS — I129 Hypertensive chronic kidney disease with stage 1 through stage 4 chronic kidney disease, or unspecified chronic kidney disease: Secondary | ICD-10-CM | POA: Diagnosis not present

## 2024-03-03 DIAGNOSIS — I700 Atherosclerosis of aorta: Secondary | ICD-10-CM | POA: Diagnosis not present

## 2024-03-03 DIAGNOSIS — Z9181 History of falling: Secondary | ICD-10-CM | POA: Diagnosis not present

## 2024-03-03 DIAGNOSIS — E875 Hyperkalemia: Secondary | ICD-10-CM | POA: Diagnosis not present

## 2024-03-03 DIAGNOSIS — Z7982 Long term (current) use of aspirin: Secondary | ICD-10-CM | POA: Diagnosis not present

## 2024-03-03 DIAGNOSIS — N3000 Acute cystitis without hematuria: Secondary | ICD-10-CM | POA: Diagnosis not present

## 2024-03-03 DIAGNOSIS — M48061 Spinal stenosis, lumbar region without neurogenic claudication: Secondary | ICD-10-CM | POA: Diagnosis not present

## 2024-03-03 DIAGNOSIS — M5441 Lumbago with sciatica, right side: Secondary | ICD-10-CM | POA: Diagnosis not present

## 2024-03-03 LAB — BASIC METABOLIC PANEL WITH GFR
BUN: 32 mg/dL — ABNORMAL HIGH (ref 6–23)
CO2: 34 meq/L — ABNORMAL HIGH (ref 19–32)
Calcium: 9.5 mg/dL (ref 8.4–10.5)
Chloride: 98 meq/L (ref 96–112)
Creatinine, Ser: 1.25 mg/dL — ABNORMAL HIGH (ref 0.40–1.20)
GFR: 37.28 mL/min — ABNORMAL LOW (ref >60.00)
Glucose, Bld: 122 mg/dL — ABNORMAL HIGH (ref 70–99)
Potassium: 4.5 meq/L (ref 3.5–5.1)
Sodium: 139 meq/L (ref 135–145)

## 2024-03-05 ENCOUNTER — Ambulatory Visit: Payer: Self-pay | Admitting: Family Medicine

## 2024-03-06 DIAGNOSIS — M5441 Lumbago with sciatica, right side: Secondary | ICD-10-CM | POA: Diagnosis not present

## 2024-03-06 DIAGNOSIS — M170 Bilateral primary osteoarthritis of knee: Secondary | ICD-10-CM | POA: Diagnosis not present

## 2024-03-06 DIAGNOSIS — F418 Other specified anxiety disorders: Secondary | ICD-10-CM | POA: Diagnosis not present

## 2024-03-06 DIAGNOSIS — N3000 Acute cystitis without hematuria: Secondary | ICD-10-CM | POA: Diagnosis not present

## 2024-03-06 DIAGNOSIS — M109 Gout, unspecified: Secondary | ICD-10-CM | POA: Diagnosis not present

## 2024-03-06 DIAGNOSIS — Z7982 Long term (current) use of aspirin: Secondary | ICD-10-CM | POA: Diagnosis not present

## 2024-03-06 DIAGNOSIS — Z9181 History of falling: Secondary | ICD-10-CM | POA: Diagnosis not present

## 2024-03-06 DIAGNOSIS — N1832 Chronic kidney disease, stage 3b: Secondary | ICD-10-CM | POA: Diagnosis not present

## 2024-03-06 DIAGNOSIS — I700 Atherosclerosis of aorta: Secondary | ICD-10-CM | POA: Diagnosis not present

## 2024-03-06 DIAGNOSIS — I129 Hypertensive chronic kidney disease with stage 1 through stage 4 chronic kidney disease, or unspecified chronic kidney disease: Secondary | ICD-10-CM | POA: Diagnosis not present

## 2024-03-06 DIAGNOSIS — M48061 Spinal stenosis, lumbar region without neurogenic claudication: Secondary | ICD-10-CM | POA: Diagnosis not present

## 2024-03-08 ENCOUNTER — Emergency Department
Admission: EM | Admit: 2024-03-08 | Discharge: 2024-03-08 | Disposition: A | Discharge status: Home / Self Care | Attending: Emergency Medicine | Admitting: Emergency Medicine

## 2024-03-08 ENCOUNTER — Emergency Department

## 2024-03-08 ENCOUNTER — Other Ambulatory Visit: Payer: Self-pay

## 2024-03-08 ENCOUNTER — Ambulatory Visit: Admitting: Family Medicine

## 2024-03-08 DIAGNOSIS — J181 Lobar pneumonia, unspecified organism: Secondary | ICD-10-CM | POA: Diagnosis not present

## 2024-03-08 DIAGNOSIS — N3000 Acute cystitis without hematuria: Secondary | ICD-10-CM | POA: Diagnosis not present

## 2024-03-08 DIAGNOSIS — I129 Hypertensive chronic kidney disease with stage 1 through stage 4 chronic kidney disease, or unspecified chronic kidney disease: Secondary | ICD-10-CM | POA: Diagnosis not present

## 2024-03-08 DIAGNOSIS — R0602 Shortness of breath: Secondary | ICD-10-CM | POA: Diagnosis not present

## 2024-03-08 DIAGNOSIS — N189 Chronic kidney disease, unspecified: Secondary | ICD-10-CM | POA: Diagnosis not present

## 2024-03-08 DIAGNOSIS — E875 Hyperkalemia: Secondary | ICD-10-CM | POA: Diagnosis not present

## 2024-03-08 DIAGNOSIS — R0600 Dyspnea, unspecified: Secondary | ICD-10-CM | POA: Diagnosis not present

## 2024-03-08 DIAGNOSIS — I700 Atherosclerosis of aorta: Secondary | ICD-10-CM | POA: Diagnosis not present

## 2024-03-08 DIAGNOSIS — R531 Weakness: Secondary | ICD-10-CM | POA: Diagnosis not present

## 2024-03-08 DIAGNOSIS — R918 Other nonspecific abnormal finding of lung field: Secondary | ICD-10-CM | POA: Diagnosis not present

## 2024-03-08 DIAGNOSIS — J189 Pneumonia, unspecified organism: Secondary | ICD-10-CM

## 2024-03-08 DIAGNOSIS — I10 Essential (primary) hypertension: Secondary | ICD-10-CM | POA: Diagnosis not present

## 2024-03-08 LAB — URINALYSIS, ROUTINE W REFLEX MICROSCOPIC
Bacteria, UA: NONE SEEN
Bilirubin Urine: NEGATIVE
Glucose, UA: NEGATIVE mg/dL
Hgb urine dipstick: NEGATIVE
Ketones, ur: NEGATIVE mg/dL
Nitrite: NEGATIVE
Protein, ur: 30 mg/dL — AB
Specific Gravity, Urine: 1.013 (ref 1.005–1.030)
pH: 7 (ref 5.0–8.0)

## 2024-03-08 LAB — BASIC METABOLIC PANEL WITH GFR
Anion gap: 10 (ref 5–15)
BUN: 25 mg/dL — ABNORMAL HIGH (ref 8–23)
CO2: 28 mmol/L (ref 22–32)
Calcium: 9.2 mg/dL (ref 8.9–10.3)
Chloride: 103 mmol/L (ref 98–111)
Creatinine, Ser: 1.03 mg/dL — ABNORMAL HIGH (ref 0.44–1.00)
GFR, Estimated: 51 mL/min — ABNORMAL LOW (ref >60)
Glucose, Bld: 132 mg/dL — ABNORMAL HIGH (ref 70–99)
Potassium: 5.2 mmol/L — ABNORMAL HIGH (ref 3.5–5.1)
Sodium: 141 mmol/L (ref 135–145)

## 2024-03-08 LAB — CBC
HCT: 42.3 % (ref 36.0–46.0)
Hemoglobin: 13.8 g/dL (ref 12.0–15.0)
MCH: 29.6 pg (ref 26.0–34.0)
MCHC: 32.6 g/dL (ref 30.0–36.0)
MCV: 90.8 fL (ref 80.0–100.0)
Platelets: 262 10*3/uL (ref 150–400)
RBC: 4.66 MIL/uL (ref 3.87–5.11)
RDW: 12.8 % (ref 11.5–15.5)
WBC: 9 10*3/uL (ref 4.0–10.5)
nRBC: 0 % (ref 0.0–0.2)

## 2024-03-08 LAB — TROPONIN I (HIGH SENSITIVITY)
Troponin I (High Sensitivity): 16 ng/L (ref <18)
Troponin I (High Sensitivity): 17 ng/L (ref <18)

## 2024-03-08 LAB — BRAIN NATRIURETIC PEPTIDE: B Natriuretic Peptide: 327.8 pg/mL — ABNORMAL HIGH (ref 0.0–100.0)

## 2024-03-08 LAB — RESP PANEL BY RT-PCR (RSV, FLU A&B, COVID)  RVPGX2
Influenza A by PCR: NEGATIVE
Influenza B by PCR: NEGATIVE
Resp Syncytial Virus by PCR: NEGATIVE
SARS Coronavirus 2 by RT PCR: NEGATIVE

## 2024-03-08 MED ORDER — AZITHROMYCIN 250 MG PO TABS: ORAL_TABLET | ORAL | 0 refills | Status: AC

## 2024-03-08 MED ORDER — LIDOCAINE 5 % EX PTCH
1.0000 | MEDICATED_PATCH | CUTANEOUS | Status: DC
  Administered 2024-03-08: 1 via TRANSDERMAL
  Filled 2024-03-08: qty 1

## 2024-03-08 MED ORDER — LIDOCAINE 5 % EX PTCH: 1.0000 | MEDICATED_PATCH | CUTANEOUS | 0 refills | Status: DC

## 2024-03-08 MED ORDER — AMOXICILLIN-POT CLAVULANATE 875-125 MG PO TABS: 1.0000 | ORAL_TABLET | Freq: Two times a day (BID) | ORAL | 0 refills | Status: AC

## 2024-03-08 NOTE — ED Triage Notes (Signed)
 First Nurse Note:  Pt via [facility] from home. Pt c/o SOB this AM, reports she has SOB every morning but said that it was worse this morning. Pt is on Lasix . Denies pain. 12 lead unremarkable per EMS. Pt is A&Ox4 and NAD.  97% on RA 170/62 BP  72 HR  18 RR

## 2024-03-08 NOTE — ED Provider Notes (Signed)
 [facility] Provider Note    Event Date/Time   First MD Initiated Contact with Patient 03/08/24 1145     (approximate)   History   Shortness of Breath   HPI  Linda Robinson is a 89 y.o. female with hypertension, CKD who comes in with concerns for shortness of breath.  Patient does report being on Lasix .  Patient reports that typically every morning she has a little bit of weakness but after she eats food that she feels better.  She reports that today her weakness just seemed a little bit more than normal and that she was having more difficulty getting around.  She denies any falls hitting her head, confusion, headaches.  She states that when her son got there they noticed that she had some labored breathing.  She denies feeling short of breath or having any cough or respiratory symptoms that shows that family noted that her breathing was more labored.  When EMS came her oxygen levels were normal but they decided to transport patient here.  Upon getting here her breathing has normalized and she denies any shortness of breath.  Denies any chest pain.  Denies any leg swelling.  Reports being compliant with medications.  Typically ambulates with a walker.    Physical Exam   Triage Vital Signs: ED Triage Vitals  Encounter Vitals Group     BP --      Systolic BP Percentile --      Diastolic BP Percentile --      Pulse Rate 03/08/24 1109 75     Resp 03/08/24 1109 (!) 21     Temp 03/08/24 1109 99 F (37.2 C)     Temp src --      SpO2 03/08/24 1109 97 %     Weight 03/08/24 1111 128 lb 12 oz (58.4 kg)     Height 03/08/24 1111 5' 1 (1.549 m)     Head Circumference --      Peak Flow --      Pain Score 03/08/24 1109 6     Pain Loc --      Pain Education --      Exclude from Growth Chart --     Most recent vital signs: Vitals:   03/08/24 1109  Pulse: 75  Resp: (!) 21  Temp: 99 F (37.2 C)  SpO2: 97%     General: Awake, no distress.  CV:  Good  peripheral perfusion.  Resp:  Normal effort.  Clear lungs Abd:  No distention.  Soft and nontender Other:  No swelling in legs.  No calf tenderness Cranial nerves II to XII are intact.  Equal strength in arms and legs.   ED Results / Procedures / Treatments   Labs (all labs ordered are listed, but only abnormal results are displayed) Labs Reviewed  BASIC METABOLIC PANEL WITH GFR - Abnormal; Notable for the following components:      Result Value   Potassium 5.2 (*)    Glucose, Bld 132 (*)    BUN 25 (*)    Creatinine, Ser 1.03 (*)    GFR, Estimated 51 (*)    All other components within normal limits  CBC  BRAIN NATRIURETIC PEPTIDE  URINALYSIS, ROUTINE W REFLEX MICROSCOPIC  TROPONIN I (HIGH SENSITIVITY)     EKG  My interpretation of EKG:  Normal sinus rate of 73 without any ST elevation or T wave versions, normal intervals  RADIOLOGY I have reviewed the xray personally  and interpreted possible LLQ opacity.    PROCEDURES:  Critical Care performed: No  Procedures   MEDICATIONS ORDERED IN ED: Medications - No data to display   IMPRESSION / MDM / ASSESSMENT AND PLAN / ED COURSE  I reviewed the triage vital signs and the nursing notes.   Patient's presentation is most consistent with acute presentation with potential threat to life or bodily function.   Patient comes in with concerns for some increased work of breathing, shortness of breath per family.  X-ray was ordered evaluate for edema, pneumonia.  Urine evaluate for UTI.  Blood work ordered evaluate for severe infection, AKI, electrolyte abnormalities.  BMP shows creatinine that is around baseline at 1.03 with a potassium of 5.2 CBC is reassuring with normal white count Troponin is negative at 16  IMPRESSION: Subtle opacity at the left lower lobe posteriorly. Infiltrate is possible. Recommend follow-up.   Urine looks concerning for possible UTI  1:55 PM when I reevaluated patient she reports ambulating  fine and denies any symptoms with ambulation.  She attributes a lot of her weakness to chronic back pain and leg pain.  She states that today she just got really frustrated with the discomfort that she had in her weakness.  She denies ever feeling short of breath even when the son stated that she was working to breathe faster and really denies any respiratory symptoms.  She got no evidence of DVT on examination given she continues to deny shortness of breath with ambulatory sat that is reassuring we discussed CT imaging but of opted to hold off as I have lower suspicion for pulmonary embolism at this time.  Her x-ray did show a little possible infiltrate in the left lower lobe and we discussed doing an antibiotic that would cover both pneumonia as well as her urine given there is concern for possible UTI.  She is got no evidence of cord compression equal strength in her legs and sensations intact flexing extending her ankles.  No signs of arterial issue in her feet we discussed admission to the hospital patient declined stating that she would prefer to go home.  She is willing to at least stay for a repeat troponin to ensure this was not any evidence of ACS.  COVID flu are negative. We discussed her slightly elevated 5.2 potassium but this seems like a chronic issue for her over the past year.  She reports having follow-up with her primary care doctor on Monday to discuss this and possibly refer her to nephrology.  Not feeling any medications need to be given at this time given only slightly elevated and that this has been ongoing for over a year.  Her BNP is slightly elevated but on exam she looks euvolemic and no significant edema on x-ray so it may be best to continue her diuretic to help with the potassium but not to do additional diuretic.  I did discuss this with patient and she felt comfortable with this plan  Considered and offered admission to patient but at this time she preferred to go home and she  feels comfortable with this plan.  Patient has allergies to cephalosporins but she has tolerated Augmentin  previously.  Augmentin  is not the best for UTIs but does have some coverage she would denies any urinary symptoms but urine was sent for culture to see if this was going to be sensitive.  I do not want to put patient on 3 antibiotics and I feel like Levaquin  or Cipro  could  have bad side effects.   The patient is on the cardiac monitor to evaluate for evidence of arrhythmia and/or significant heart rate changes.      FINAL CLINICAL IMPRESSION(S) / ED DIAGNOSES   Final diagnoses:  Pneumonia of left lower lobe due to infectious organism  Acute cystitis without hematuria  Weakness     Rx / DC Orders   ED Discharge Orders          Ordered    amoxicillin -clavulanate (AUGMENTIN ) 875-125 MG tablet  2 times daily        03/08/24 1403    azithromycin  (ZITHROMAX  Z-PAK) 250 MG tablet        03/08/24 1403             Note:  This document was prepared using Dragon voice recognition software and may include unintentional dictation errors.   Lubertha Rush, MD 03/08/24 [phone]

## 2024-03-08 NOTE — ED Notes (Signed)
 Pt ambulated with rolling walker to hallway bathroom.  Pt saturation dropped to 95%. Pt does report shortness of breath, but states it is no more than usual.  Pt states she is ready to go home.  Family at bedside. Dr. Peggi Bowels updated.

## 2024-03-08 NOTE — ED Triage Notes (Signed)
 See first nurse note.

## 2024-03-08 NOTE — Discharge Instructions (Signed)
 Please follow-up with your primary care doctor for a repeat x-ray after treatment with antibiotics to ensure this area is resolving as well as following up with them on Monday to recheck your slightly elevated potassium to decide a course of care for this.  Return to the ER if develop worsening symptoms or any other concerns.  We had discussed admission to the hospital but given you are ambulatory without any shortness of breath and feeling stronger you felt comfortable with discharge home on antibiotics for pneumonia, possible UTI    IMPRESSION: Subtle opacity at the left lower lobe posteriorly. Infiltrate is possible. Recommend follow-up.

## 2024-03-08 NOTE — ED Notes (Signed)
 DC instructions discussed with pt and family , advised on medications to pick up at [facility].  Pt and family verbalized understanding, return precautions discussed as well.

## 2024-03-08 NOTE — ED Notes (Signed)
CCMD notified

## 2024-03-13 ENCOUNTER — Telehealth: Payer: Self-pay

## 2024-03-13 ENCOUNTER — Ambulatory Visit (INDEPENDENT_AMBULATORY_CARE_PROVIDER_SITE_OTHER): Admitting: Family Medicine

## 2024-03-13 ENCOUNTER — Encounter: Payer: Self-pay | Admitting: Family Medicine

## 2024-03-13 ENCOUNTER — Other Ambulatory Visit (HOSPITAL_COMMUNITY): Payer: Self-pay

## 2024-03-13 VITALS — BP 136/68 | HR 65 | Temp 97.9°F | Ht 61.0 in | Wt 132.0 lb

## 2024-03-13 DIAGNOSIS — J189 Pneumonia, unspecified organism: Secondary | ICD-10-CM | POA: Insufficient documentation

## 2024-03-13 DIAGNOSIS — I10 Essential (primary) hypertension: Secondary | ICD-10-CM | POA: Diagnosis not present

## 2024-03-13 DIAGNOSIS — E875 Hyperkalemia: Secondary | ICD-10-CM

## 2024-03-13 DIAGNOSIS — R8290 Unspecified abnormal findings in urine: Secondary | ICD-10-CM | POA: Diagnosis not present

## 2024-03-13 DIAGNOSIS — N1832 Chronic kidney disease, stage 3b: Secondary | ICD-10-CM | POA: Diagnosis not present

## 2024-03-13 DIAGNOSIS — R609 Edema, unspecified: Secondary | ICD-10-CM | POA: Diagnosis not present

## 2024-03-13 DIAGNOSIS — G894 Chronic pain syndrome: Secondary | ICD-10-CM | POA: Diagnosis not present

## 2024-03-13 MED ORDER — LIDOCAINE 5 % EX PTCH: 1.0000 | MEDICATED_PATCH | CUTANEOUS | 1 refills | Status: DC

## 2024-03-13 NOTE — Assessment & Plan Note (Signed)
 GFR was 37.28 in ER Urine culture negative  Blood pressure control is fair in setting of med intolerances (hydralazine , amlodipine  and lasix )  Some protein in urinalysis  K is elevated   Re check bmet today  Referral made to nephrology

## 2024-03-13 NOTE — Assessment & Plan Note (Signed)
 Taking furosemide  20 mg  Lab today

## 2024-03-13 NOTE — Assessment & Plan Note (Signed)
 Lab Results  Component Value Date   K 5.2 (H) 03/08/2024   Re check today Taking lasix  more regularly now  In setting of ckd   Ref to nephrology done

## 2024-03-13 NOTE — Telephone Encounter (Signed)
 Please let family know - the %5 patch was denied as expected They can get the 4% patch over the counter

## 2024-03-13 NOTE — Telephone Encounter (Signed)
 Pharmacy Patient Advocate Encounter  Received notification from [facility] medicarethat Prior Authorization for Lidocaine  5% patches  has been DENIED.  See denial reason below. No denial letter attached in CMM. Will attach denial letter to Media tab once received.   PA #/Case ID/Reference #: [id number]

## 2024-03-13 NOTE — Assessment & Plan Note (Signed)
 Seen in ER Reviewed hospital records, lab results and studies in detail  Subtle opacity in LLL in setting of weakness and shortness of breath   Finishing augmentin  and zpack  Clinically improved Better exam  Pulse ox normal   Follow up 6 weeks for visit and re check cxr

## 2024-03-13 NOTE — Assessment & Plan Note (Signed)
 bp in fair control at this time /improved  BP Readings from Last 1 Encounters:  03/13/24 136/68   No changes needed Most recent labs reviewed  Disc lifstyle change with low sodium diet and exercise  Hydralazine  10 mg bid (does not tolerate tid) Amlodipine  10 mg daily  Lasix  20 mg as needed for -taking more lately  Watching renal fxn and ref to nephrology

## 2024-03-13 NOTE — Assessment & Plan Note (Signed)
 Worse lately in low back  Spinal stenosis Was prescription lidocaine  patch in ER and it helped Can use 5% prescription or 4% over the counter with caution  Discussed need to use 12 hours on and 12 off

## 2024-03-13 NOTE — Assessment & Plan Note (Signed)
 Urine culture negative.

## 2024-03-13 NOTE — Patient Instructions (Addendum)
 I think it is fine to use lidocaine  patches over the counter or the 5% prescription  12 hours at a time    Labs for kidney and potassium today   Keep drinking water    I put the referral in for kidney specialist [facility])  Please let us  know if you don't hear in 1-2 weeks to set that up  Follow up in about 6 weeks and we will re check a chest xray

## 2024-03-13 NOTE — Telephone Encounter (Signed)
 Pharmacy Patient Advocate Encounter   Received notification from Onbase that prior authorization for Lidocaine  5% patches  is required/requested.   Insurance verification completed.   The patient is insured through [facility].   Per test claim: PA required; PA submitted to above mentioned insurance via CoverMyMeds Key/confirmation #/EOC [id number] Status is pending

## 2024-03-13 NOTE — Progress Notes (Signed)
 Subjective:    Patient ID: Linda Robinson, female    DOB: October 20, 1928, 89 y.o.   MRN: [id number]  HPI  Wt Readings from Last 3 Encounters:  03/13/24 132 lb (59.9 kg)  03/08/24 128 lb 12 oz (58.4 kg)  02/01/24 128 lb 11.2 oz (58.4 kg)   24.94 kg/m  Vitals:   03/13/24 1201  BP: 136/68  Pulse: 65  Temp: 97.9 F (36.6 C)  SpO2: 97%     Pt presents for follow up of ER visit for shortness of breath  Seen at ED [facility] on 6/11  Pt presented with shortness of breath (not hypoxic) and frustration about generalized weakness and chronic back pain  Noted subtle opacity in LLL of lung- possible infiltrate Also possible uti   Covid and flu tests negative Mild K elevation at 5.2 BNP very slightly elevated but appeared euvolemic and no edema   Augmentin  prescription for possible pna and uti along with azithro  Lidocaine  patch for back pain    Lab Results  Component Value Date   NA 141 03/08/2024   K 5.2 (H) 03/08/2024   CO2 28 03/08/2024   GLUCOSE 132 (H) 03/08/2024   BUN 25 (H) 03/08/2024   CREATININE 1.03 (H) 03/08/2024   CALCIUM 9.2 03/08/2024   GFR 37.28 (L) 03/03/2024   GFRNONAA 51 (L) 03/08/2024   BNP 327,8 Normal troponin  Trying to drink more fluid  Water and peach tea   Urine culture noted insignificant growth   DG Chest 2 View Result Date: 03/08/2024 CLINICAL DATA:  Worsening shortness of breath this morning. EXAM: CHEST - 2 VIEW COMPARISON:  One-view x-ray 10/17/2021. FINDINGS: No pneumothorax, effusion or edema. Normal cardiopericardial silhouette. Calcified aorta. There is some subtle opacity along the lung base on the lateral view which is likely in the left lower lobe on the frontal view. Subtle infiltrates possible. Recommend follow-up. Curvature of the spine with degenerative changes and osteopenia. Fixation hardware along the lower cervical spine and lumbar spine. Overlapping gown snaps. IMPRESSION: Subtle opacity at the left lower lobe posteriorly.  Infiltrate is possible. Recommend follow-up. Electronically Signed   By: Adrianna Horde M.D.   On: 03/08/2024 11:55    HTN bp is stable today  No cp or palpitations or headaches or edema  No side effects to medicines  BP Readings from Last 3 Encounters:  03/13/24 136/68  03/08/24 (!) 137/97  02/01/24 122/68   Hydralazine  10 mg bid (does not tolerate tid) Amlodipine  10 mg daily  Lasix  20 mg as needed for edema    Lab Results  Component Value Date   NA 141 03/08/2024   K 5.2 (H) 03/08/2024   CO2 28 03/08/2024   GLUCOSE 132 (H) 03/08/2024   BUN 25 (H) 03/08/2024   CREATININE 1.03 (H) 03/08/2024   CALCIUM 9.2 03/08/2024   GFR 37.28 (L) 03/03/2024   GFRNONAA 51 (L) 03/08/2024   GFR down from 44.4 to 37.28    Patient Active Problem List   Diagnosis Date Noted   Pneumonia 03/13/2024   Fatigue 01/24/2024   Abnormal urinalysis 01/24/2024   Fall at home 06/11/2023   Coccyx pain 06/11/2023   Mobility impaired 03/02/2023   Knee pain 10/12/2022   Hyperkalemia 07/03/2022   Falls frequently 03/10/2022   Osteoarthritis of both knees 10/29/2021   Lower abdominal pain 07/23/2021   Diverticulosis 07/23/2021   Cervical radicular pain (left) 09/12/2020   Cervical disc disorder with radiculopathy of cervical region 09/12/2020   Cervical spondylosis  09/12/2020   Cervical fusion syndrome (C4-T1 ACDF) 09/12/2020   Neuropathic pain of hand, left 09/12/2020   Chronic pain syndrome 09/12/2020   Failed neck syndrome 07/30/2020   Left hand pain 07/30/2020   Weakness 05/01/2020   Grief reaction 11/17/2019   Cervical stenosis of spinal canal 11/17/2019   Hearing loss 11/17/2019   Poor balance 06/12/2019   Screening mammogram, encounter for 05/23/2018   Substernal chest pain 08/16/2017   Atherosclerosis of aorta (HCC) 03/09/2017   Weight loss 02/23/2017   Routine general medical examination at a health care facility 07/05/2015   Estrogen deficiency 07/05/2015   Prediabetes 01/01/2015    Lumbar spinal stenosis 11/13/2014   History of vertigo 10/05/2014   Leukocytosis 07/16/2014   Risk for falls 06/27/2014   Right-sided low back pain with sciatica 07/24/2013   Edema 04/26/2013   Encounter for Medicare annual wellness exam 04/11/2013   Lymphocytosis 01/05/2012   GERD (gastroesophageal reflux disease)    Osteopenia 01/30/2009   Constipation 11/01/2007   Depression with anxiety 02/14/2007   Hyperlipidemia LDL goal <130 02/04/2007   Gout 02/04/2007   Essential hypertension 02/04/2007   DIVERTICULOSIS, COLON 02/04/2007   Stage 3b chronic kidney disease (CKD) (HCC) 02/04/2007   OSTEOARTHRITIS 02/04/2007   DEGENERATIVE DISC DISEASE 02/04/2007   Past Medical History:  Diagnosis Date   Ankylosis of lumbar spine    Basal cell carcinoma    removed   Cataract See eye surgery   Cervical spine pain    Chest pain    a. 02/2017 MV: EF 54%, small, distal anterior wall/apical infacrt (likely misregistration), no ischemia-->Low risk; b. 02/2017 Echo: EF 55-60%, mild MR, PASP .   Chronic back pain    stenosis   Constipation    takes Colace daily   Depression    takes Zoloft  daily   Diverticulosis of colon    GERD (gastroesophageal reflux disease) Unknown   atypical chest pain   Gout    hx of;was on Allopurinol but taken off 6 wks ago by medical MD   History of diverticulitis of colon    HLD (hyperlipidemia)    takes Pravastatin  daily   HTN (hypertension)    takes Amlodipine  and carvedilol    Lumbar spondylosis with myelopathy    Numbness    OA (osteoarthritis)    right knee   Osteopenia    takes Calcium and Vit D daily   Scoliosis    Vertigo    takes Antivert  daily as needed   Past Surgical History:  Procedure Laterality Date   APPENDECTOMY  at age 36   BACK SURGERY     CATARACT EXTRACTION Right 04/2006   CHOLECYSTECTOMY  1970   COLON SURGERY  1980s   COLONOSCOPY     EYE SURGERY     Cataract removal over several years   KNEE SURGERY Right    Medial  meniscus tear   LUMBAR LAMINECTOMY/DECOMPRESSION MICRODISCECTOMY Bilateral 11/13/2014   Procedure: Lumbar one-two Bilateral laminectomy and foraminotomy, Left Lumbar one-two microdiscectomy ;  Surgeon: Augustine Blocker, MD;  Location: [facility];  Service: Neurosurgery;  Laterality: Bilateral;   NECK SURGERY  [phone]   x 3; fusions   PARTIAL COLECTOMY  09/1999   d/t diverticulitis   SPINE SURGERY     Six  spine surgeries over several years   TUBAL LIGATION     Social History   Tobacco Use   Smoking status: Never   Smokeless tobacco: Never  Vaping Use   Vaping status:  Never Used  Substance Use Topics   Alcohol  use: Never    Alcohol /week: 0.0 standard drinks of alcohol    Drug use: Never   Family History  Problem Relation Age of Onset   Cancer Father        throat and bladder   Diabetes Father    Hypertension Father    Heart attack Father    Stroke Mother    Alzheimer's disease Mother    Hypertension Mother    Diabetes Sister    Heart disease Brother    Allergies  Allergen Reactions   Ace Inhibitors     REACTION: increased K+, increased creat.   Bactrim  [Sulfamethoxazole -Trimethoprim ]     rash   Cephalosporins     REACTION: reaction not known   Colesevelam     REACTION: constipation   Hctz [Hydrochlorothiazide ]     Makes her feel bad   Losartan      Increased K   Lovastatin     REACTION: doesn't work   Lyrica  [Pregabalin ]     Felt bad    Raloxifene     REACTION: cramps   Rosuvastatin     REACTION: myalgia   Simvastatin     REACTION: myalgia   Current Outpatient Medications on File Prior to Visit  Medication Sig Dispense Refill   acetaminophen  (TYLENOL ) 650 MG CR tablet Take 650 mg by mouth in the morning, at noon, and at bedtime.     amLODipine  (NORVASC ) 10 MG tablet TAKE 1 TABLET DAILY 90 tablet 0   amoxicillin -clavulanate (AUGMENTIN ) 875-125 MG tablet Take 1 tablet by mouth 2 (two) times daily for 5 days. 10 tablet 0   aspirin  EC 81 MG tablet Take  1 tablet (81 mg total) by mouth daily. 90 tablet 3   azithromycin  (ZITHROMAX  Z-PAK) 250 MG tablet Take 2 tablets (500 mg) on  Day 1,  followed by 1 tablet (250 mg) once daily on Days 2 through 5. 6 each 0   Cranberry 450 MG TABS Take 1 capsule by mouth daily.     docusate sodium  (COLACE) 100 MG capsule Take 1-2 capsules (100-200 mg total) by mouth 2 (two) times daily. 100 mg in the morning and 200 mg at night 180 capsule 1   famotidine  (PEPCID ) 20 MG tablet TAKE 1 TABLET TWICE A DAY 180 tablet 0   fish oil-omega-3 fatty acids 1000 MG capsule Take 1 g by mouth at bedtime.     furosemide  (LASIX ) 20 MG tablet Take 20 mg by mouth as needed.     hydrALAZINE  (APRESOLINE ) 10 MG tablet Take 1 tablet (10 mg total) by mouth 3 (three) times daily. (Patient taking differently: Take 10 mg by mouth in the morning and at bedtime.) 270 tablet 1   Magnesium  250 MG TABS Take 1 tablet by mouth daily.     pravastatin  (PRAVACHOL ) 20 MG tablet TAKE 1 TABLET DAILY 90 tablet 0   sertraline  (ZOLOFT ) 100 MG tablet TAKE 1.5 TABLETS (150MG  TOTAL) BY MOUTH DAILY 135 tablet 3   Tetrahydrozoline HCl (EYE DROPS OP) Apply 1-2 drops to eye as needed (for dry eye).     No current facility-administered medications on file prior to visit.    Review of Systems  Constitutional:  Positive for fatigue. Negative for activity change, appetite change, fever and unexpected weight change.  HENT:  Negative for congestion, ear pain, rhinorrhea, sinus pressure and sore throat.   Eyes:  Negative for pain, redness and visual disturbance.  Respiratory:  Negative for cough,  shortness of breath and wheezing.   Cardiovascular:  Negative for chest pain and palpitations.  Gastrointestinal:  Negative for abdominal pain, blood in stool, constipation and diarrhea.  Endocrine: Negative for polydipsia and polyuria.  Genitourinary:  Negative for dysuria, frequency and urgency.  Musculoskeletal:  Positive for back pain. Negative for arthralgias and  myalgias.  Skin:  Negative for pallor and rash.  Allergic/Immunologic: Negative for environmental allergies.  Neurological:  Negative for dizziness, syncope and headaches.       Episodic generalized weakness   Hematological:  Negative for adenopathy. Does not bruise/bleed easily.  Psychiatric/Behavioral:  Negative for decreased concentration and dysphoric mood. The patient is not nervous/anxious.        Objective:   Physical Exam Constitutional:      General: She is not in acute distress.    Appearance: Normal appearance. She is well-developed and normal weight. She is not ill-appearing or diaphoretic.  HENT:     Head: Normocephalic and atraumatic.   Eyes:     General:        Right eye: No discharge.        Left eye: No discharge.     Conjunctiva/sclera: Conjunctivae normal.     Pupils: Pupils are equal, round, and reactive to light.   Neck:     Thyroid : No thyromegaly.     Vascular: No carotid bruit or JVD.   Cardiovascular:     Rate and Rhythm: Normal rate and regular rhythm.     Heart sounds: Normal heart sounds.     No gallop.  Pulmonary:     Effort: Pulmonary effort is normal. No respiratory distress.     Breath sounds: Normal breath sounds. No stridor. No wheezing, rhonchi or rales.     Comments: Good air exch Chest:     Chest wall: No tenderness.  Abdominal:     General: There is no distension or abdominal bruit.     Palpations: Abdomen is soft.   Musculoskeletal:     Cervical back: Normal range of motion and neck supple.     Right lower leg: No edema.     Left lower leg: No edema.     Comments: Limited rom of LS  Lymphadenopathy:     Cervical: No cervical adenopathy.   Skin:    General: Skin is warm and dry.     Coloration: Skin is not pale.     Findings: No rash.   Neurological:     Mental Status: She is alert.     Coordination: Coordination normal.     Deep Tendon Reflexes: Reflexes are normal and symmetric. Reflexes normal.   Psychiatric:         Mood and Affect: Mood is anxious.     Comments: Pleasant            Assessment & Plan:   Problem List Items Addressed This Visit       Cardiovascular and Mediastinum   Essential hypertension (Chronic)   bp in fair control at this time /improved  BP Readings from Last 1 Encounters:  03/13/24 136/68   No changes needed Most recent labs reviewed  Disc lifstyle change with low sodium diet and exercise  Hydralazine  10 mg bid (does not tolerate tid) Amlodipine  10 mg daily  Lasix  20 mg as needed for -taking more lately  Watching renal fxn and ref to nephrology      Relevant Orders   Ambulatory referral to Nephrology     Respiratory  Pneumonia - Primary   Seen in ER Reviewed hospital records, lab results and studies in detail  Subtle opacity in LLL in setting of weakness and shortness of breath   Finishing augmentin  and zpack  Clinically improved Better exam  Pulse ox normal   Follow up 6 weeks for visit and re check cxr          Genitourinary   Stage 3b chronic kidney disease (CKD) (HCC)   GFR was 37.28 in ER Urine culture negative  Blood pressure control is fair in setting of med intolerances (hydralazine , amlodipine  and lasix )  Some protein in urinalysis  K is elevated   Re check bmet today  Referral made to nephrology      Relevant Orders   Basic metabolic panel with GFR   Ambulatory referral to Nephrology     Other   Hyperkalemia   Lab Results  Component Value Date   K 5.2 (H) 03/08/2024   Re check today Taking lasix  more regularly now  In setting of ckd   Ref to nephrology done        Relevant Orders   Basic metabolic panel with GFR   Ambulatory referral to Nephrology   Edema   Taking furosemide  20 mg  Lab today      Chronic pain syndrome   Worse lately in low back  Spinal stenosis Was prescription lidocaine  patch in ER and it helped Can use 5% prescription or 4% over the counter with caution  Discussed need to use 12 hours on  and 12 off       Abnormal urinalysis   Urine culture negative

## 2024-03-14 ENCOUNTER — Ambulatory Visit: Payer: Self-pay | Admitting: Family Medicine

## 2024-03-14 DIAGNOSIS — E875 Hyperkalemia: Secondary | ICD-10-CM

## 2024-03-14 DIAGNOSIS — N1832 Chronic kidney disease, stage 3b: Secondary | ICD-10-CM

## 2024-03-14 LAB — BASIC METABOLIC PANEL WITH GFR
BUN/Creatinine Ratio: 27 (calc) — ABNORMAL HIGH (ref 6–22)
BUN: 30 mg/dL — ABNORMAL HIGH (ref 7–25)
CO2: 24 mmol/L (ref 20–32)
Calcium: 9.8 mg/dL (ref 8.6–10.4)
Chloride: 102 mmol/L (ref 98–110)
Creat: 1.13 mg/dL — ABNORMAL HIGH (ref 0.60–0.95)
Glucose, Bld: 110 mg/dL — ABNORMAL HIGH (ref 65–99)
Potassium: 5.6 mmol/L — ABNORMAL HIGH (ref 3.5–5.3)
Sodium: 140 mmol/L (ref 135–146)
eGFR: 45 mL/min/{1.73_m2} — ABNORMAL LOW (ref >=60)

## 2024-03-14 NOTE — Telephone Encounter (Signed)
 Left VM letting pt know Dr. Royden Purl comments

## 2024-03-15 ENCOUNTER — Telehealth: Payer: Self-pay

## 2024-03-15 NOTE — Telephone Encounter (Signed)
 Addressed in result notes

## 2024-03-15 NOTE — Telephone Encounter (Signed)
 Copied from CRM [id number]. Topic: Clinical - Lab/Test Results >> Mar 15, 2024 12:38 PM Emmet Harm C wrote: Reason for CRM: Patient called to have a follow up with Dr. Malissa Se about labs and to call to discuss further

## 2024-03-16 DIAGNOSIS — Z9181 History of falling: Secondary | ICD-10-CM | POA: Diagnosis not present

## 2024-03-16 DIAGNOSIS — M109 Gout, unspecified: Secondary | ICD-10-CM | POA: Diagnosis not present

## 2024-03-16 DIAGNOSIS — M5441 Lumbago with sciatica, right side: Secondary | ICD-10-CM | POA: Diagnosis not present

## 2024-03-16 DIAGNOSIS — I700 Atherosclerosis of aorta: Secondary | ICD-10-CM | POA: Diagnosis not present

## 2024-03-16 DIAGNOSIS — M48061 Spinal stenosis, lumbar region without neurogenic claudication: Secondary | ICD-10-CM | POA: Diagnosis not present

## 2024-03-16 DIAGNOSIS — I129 Hypertensive chronic kidney disease with stage 1 through stage 4 chronic kidney disease, or unspecified chronic kidney disease: Secondary | ICD-10-CM | POA: Diagnosis not present

## 2024-03-16 DIAGNOSIS — M170 Bilateral primary osteoarthritis of knee: Secondary | ICD-10-CM | POA: Diagnosis not present

## 2024-03-16 DIAGNOSIS — F418 Other specified anxiety disorders: Secondary | ICD-10-CM | POA: Diagnosis not present

## 2024-03-16 DIAGNOSIS — N1832 Chronic kidney disease, stage 3b: Secondary | ICD-10-CM | POA: Diagnosis not present

## 2024-03-16 DIAGNOSIS — Z7982 Long term (current) use of aspirin: Secondary | ICD-10-CM | POA: Diagnosis not present

## 2024-03-16 DIAGNOSIS — N3000 Acute cystitis without hematuria: Secondary | ICD-10-CM | POA: Diagnosis not present

## 2024-03-17 ENCOUNTER — Ambulatory Visit: Admitting: Family Medicine

## 2024-03-20 NOTE — Telephone Encounter (Signed)
 Monique with [facility] notified as instructed with Dr Randeen verbal OK for [facility] PT 1 x a wk for 4 wks. Monique voiced understanding and nothing further needed.

## 2024-03-20 NOTE — Telephone Encounter (Unsigned)
 Copied from CRM [id number]. Topic: Clinical - Home Health Verbal Orders >> Mar 20, 2024  4:48 PM Tiffini S wrote: Caller/Agency: Monica with [facility] Callback Number: [phone] Service Requested: Physical Therapy Frequency: 1 week for 4 times of week  Any new concerns about the patient? No

## 2024-03-20 NOTE — Telephone Encounter (Signed)
Please ok that verbal order  

## 2024-03-21 ENCOUNTER — Other Ambulatory Visit (INDEPENDENT_AMBULATORY_CARE_PROVIDER_SITE_OTHER)

## 2024-03-21 ENCOUNTER — Ambulatory Visit: Payer: Self-pay | Admitting: Family Medicine

## 2024-03-21 DIAGNOSIS — N1832 Chronic kidney disease, stage 3b: Secondary | ICD-10-CM | POA: Diagnosis not present

## 2024-03-21 DIAGNOSIS — E875 Hyperkalemia: Secondary | ICD-10-CM | POA: Diagnosis not present

## 2024-03-21 LAB — BASIC METABOLIC PANEL WITH GFR
BUN: 33 mg/dL — ABNORMAL HIGH (ref 6–23)
CO2: 36 meq/L — ABNORMAL HIGH (ref 19–32)
Calcium: 9.7 mg/dL (ref 8.4–10.5)
Chloride: 98 meq/L (ref 96–112)
Creatinine, Ser: 1.17 mg/dL (ref 0.40–1.20)
GFR: 40.34 mL/min — ABNORMAL LOW (ref >60.00)
Glucose, Bld: 137 mg/dL — ABNORMAL HIGH (ref 70–99)
Potassium: 4.7 meq/L (ref 3.5–5.1)
Sodium: 141 meq/L (ref 135–145)

## 2024-03-22 ENCOUNTER — Other Ambulatory Visit

## 2024-03-23 DIAGNOSIS — Z7982 Long term (current) use of aspirin: Secondary | ICD-10-CM | POA: Diagnosis not present

## 2024-03-23 DIAGNOSIS — Z9181 History of falling: Secondary | ICD-10-CM | POA: Diagnosis not present

## 2024-03-23 DIAGNOSIS — M48061 Spinal stenosis, lumbar region without neurogenic claudication: Secondary | ICD-10-CM | POA: Diagnosis not present

## 2024-03-23 DIAGNOSIS — I700 Atherosclerosis of aorta: Secondary | ICD-10-CM | POA: Diagnosis not present

## 2024-03-23 DIAGNOSIS — M109 Gout, unspecified: Secondary | ICD-10-CM | POA: Diagnosis not present

## 2024-03-23 DIAGNOSIS — N1832 Chronic kidney disease, stage 3b: Secondary | ICD-10-CM | POA: Diagnosis not present

## 2024-03-23 DIAGNOSIS — M170 Bilateral primary osteoarthritis of knee: Secondary | ICD-10-CM | POA: Diagnosis not present

## 2024-03-23 DIAGNOSIS — N3000 Acute cystitis without hematuria: Secondary | ICD-10-CM | POA: Diagnosis not present

## 2024-03-23 DIAGNOSIS — I129 Hypertensive chronic kidney disease with stage 1 through stage 4 chronic kidney disease, or unspecified chronic kidney disease: Secondary | ICD-10-CM | POA: Diagnosis not present

## 2024-03-23 DIAGNOSIS — F418 Other specified anxiety disorders: Secondary | ICD-10-CM | POA: Diagnosis not present

## 2024-03-23 DIAGNOSIS — M5441 Lumbago with sciatica, right side: Secondary | ICD-10-CM | POA: Diagnosis not present

## 2024-03-27 DIAGNOSIS — I129 Hypertensive chronic kidney disease with stage 1 through stage 4 chronic kidney disease, or unspecified chronic kidney disease: Secondary | ICD-10-CM | POA: Diagnosis not present

## 2024-03-27 DIAGNOSIS — M5441 Lumbago with sciatica, right side: Secondary | ICD-10-CM | POA: Diagnosis not present

## 2024-03-27 DIAGNOSIS — M48061 Spinal stenosis, lumbar region without neurogenic claudication: Secondary | ICD-10-CM | POA: Diagnosis not present

## 2024-03-27 DIAGNOSIS — Z7982 Long term (current) use of aspirin: Secondary | ICD-10-CM | POA: Diagnosis not present

## 2024-03-27 DIAGNOSIS — Z9181 History of falling: Secondary | ICD-10-CM | POA: Diagnosis not present

## 2024-03-27 DIAGNOSIS — F418 Other specified anxiety disorders: Secondary | ICD-10-CM | POA: Diagnosis not present

## 2024-03-27 DIAGNOSIS — M170 Bilateral primary osteoarthritis of knee: Secondary | ICD-10-CM | POA: Diagnosis not present

## 2024-03-27 DIAGNOSIS — N1832 Chronic kidney disease, stage 3b: Secondary | ICD-10-CM | POA: Diagnosis not present

## 2024-03-27 DIAGNOSIS — M109 Gout, unspecified: Secondary | ICD-10-CM | POA: Diagnosis not present

## 2024-03-27 DIAGNOSIS — N3000 Acute cystitis without hematuria: Secondary | ICD-10-CM | POA: Diagnosis not present

## 2024-03-27 DIAGNOSIS — I700 Atherosclerosis of aorta: Secondary | ICD-10-CM | POA: Diagnosis not present

## 2024-04-03 DIAGNOSIS — I10 Essential (primary) hypertension: Secondary | ICD-10-CM | POA: Diagnosis not present

## 2024-04-03 DIAGNOSIS — N1831 Chronic kidney disease, stage 3a: Secondary | ICD-10-CM | POA: Diagnosis not present

## 2024-04-03 DIAGNOSIS — E875 Hyperkalemia: Secondary | ICD-10-CM | POA: Diagnosis not present

## 2024-04-03 DIAGNOSIS — I129 Hypertensive chronic kidney disease with stage 1 through stage 4 chronic kidney disease, or unspecified chronic kidney disease: Secondary | ICD-10-CM | POA: Diagnosis not present

## 2024-04-18 ENCOUNTER — Other Ambulatory Visit: Payer: Self-pay | Admitting: Family Medicine

## 2024-04-25 ENCOUNTER — Ambulatory Visit (INDEPENDENT_AMBULATORY_CARE_PROVIDER_SITE_OTHER): Admitting: Family Medicine

## 2024-04-25 ENCOUNTER — Ambulatory Visit (INDEPENDENT_AMBULATORY_CARE_PROVIDER_SITE_OTHER)
Admission: RE | Admit: 2024-04-25 | Discharge: 2024-04-25 | Disposition: A | Discharge status: Home / Self Care | Source: Ambulatory Visit | Attending: Family Medicine | Admitting: Family Medicine

## 2024-04-25 ENCOUNTER — Ambulatory Visit: Payer: Self-pay | Admitting: Family Medicine

## 2024-04-25 ENCOUNTER — Encounter: Payer: Self-pay | Admitting: Family Medicine

## 2024-04-25 VITALS — BP 134/70 | HR 68 | Temp 98.2°F | Ht 61.0 in | Wt 130.0 lb

## 2024-04-25 DIAGNOSIS — J189 Pneumonia, unspecified organism: Secondary | ICD-10-CM | POA: Diagnosis not present

## 2024-04-25 DIAGNOSIS — N1832 Chronic kidney disease, stage 3b: Secondary | ICD-10-CM | POA: Diagnosis not present

## 2024-04-25 DIAGNOSIS — I10 Essential (primary) hypertension: Secondary | ICD-10-CM

## 2024-04-25 DIAGNOSIS — R531 Weakness: Secondary | ICD-10-CM

## 2024-04-25 DIAGNOSIS — J449 Chronic obstructive pulmonary disease, unspecified: Secondary | ICD-10-CM | POA: Diagnosis not present

## 2024-04-25 NOTE — Assessment & Plan Note (Addendum)
 bp in fair control at this time / stable  BP Readings from Last 1 Encounters:  04/25/24 134/70   No changes needed Most recent labs reviewed  Disc lifstyle change with low sodium diet and exercise  Improved Reviewed nephrology note for hypertensive ckd Continue Hydralazine  10 mg bid Amlodipine  10 mg daily  Lasix  20 mg daily as needed

## 2024-04-25 NOTE — Assessment & Plan Note (Signed)
 Still feels fairly weak (worsened after pna) Did complete HH PT with some improvement  Encouraged to eat regularly / protein intake reviewed  Also encouraged to continue PT exercises   Reassuring exam today

## 2024-04-25 NOTE — Progress Notes (Signed)
 Subjective:    Patient ID: Linda Robinson, female    DOB: [birth year], 89 y.o.   MRN: [id number]  HPI  Wt Readings from Last 3 Encounters:  04/25/24 130 lb (59 kg)  03/13/24 132 lb (59.9 kg)  03/08/24 128 lb 12 oz (58.4 kg)   24.56 kg/m  Vitals:   04/25/24 1421  BP: 134/70  Pulse: 68  Temp: 98.2 F (36.8 C)  SpO2: 97%    Pt presents for follow up of pneumonia, HTN and CKD, weakness   Was seen in early/mid June for LLL infiltrate with resp symptoms  Was treatment with augmentin  and azithro  Clinically improved at follow up on 6/16 Here for cxr and further follow up   Feels fairly back to normal     HTN with ckd  Saw Dr Dennise on 7/7 for CKD and hyperkalemia  Hypertensive ckd stage 3a with microalbuminuria  Recommended low K diet   Lab Results  Component Value Date   NA 141 03/21/2024   K 4.7 03/21/2024   CO2 36 (H) 03/21/2024   GLUCOSE 137 (H) 03/21/2024   BUN 33 (H) 03/21/2024   CREATININE 1.17 03/21/2024   CALCIUM 9.7 03/21/2024   GFR 40.34 (L) 03/21/2024   EGFR 45 (L) 03/13/2024   GFRNONAA 51 (L) 03/08/2024   K was better on that check   Amlodipine  10 mg daily Hydralazine  10 mg bid  Lasix  prn 20 mg    Knees and back are her biggest problem   Feels generally weak  Hard to exercise due to pain   Tries to walk around the house  Afraid to fall  Did some PT with [facility]   Making effort to eat  Some protein drinks    Has a nurse coming out soon   Takes 2 tylenol  3 times daily   Lab Results  Component Value Date   ALT 9 01/24/2024   AST 18 01/24/2024   ALKPHOS 61 01/24/2024   BILITOT 0.6 01/24/2024    CXR  DG Chest 2 View Result Date: 04/25/2024 CLINICAL DATA:  follow up of LLL pneumonia clinically much better. EXAM: CHEST - 2 VIEW COMPARISON:  03/08/2024. FINDINGS: Bilateral lungs appear hyperlucent with coarse bronchovascular markings, in keeping with COPD. Bilateral lungs otherwise appear clear. No dense consolidation or lung collapse.  Bilateral costophrenic angles are clear. Normal cardio-mediastinal silhouette. No acute osseous abnormalities. Upper thoracic and lumbar spinal fixation hardware noted. The soft tissues are within normal limits. IMPRESSION: No active cardiopulmonary disease. COPD. Electronically Signed   By: Ree Molt M.D.   On: 04/25/2024 15:11      Patient Active Problem List   Diagnosis Date Noted   Pneumonia 03/13/2024   Fatigue 01/24/2024   Abnormal urinalysis 01/24/2024   Fall at home 06/11/2023   Coccyx pain 06/11/2023   Mobility impaired 03/02/2023   Knee pain 10/12/2022   Hyperkalemia 07/03/2022   Falls frequently 03/10/2022   Osteoarthritis of both knees 10/29/2021   Lower abdominal pain 07/23/2021   Diverticulosis 07/23/2021   Cervical radicular pain (left) 09/12/2020   Cervical disc disorder with radiculopathy of cervical region 09/12/2020   Cervical spondylosis 09/12/2020   Cervical fusion syndrome (C4-T1 ACDF) 09/12/2020   Neuropathic pain of hand, left 09/12/2020   Chronic pain syndrome 09/12/2020   Failed neck syndrome 07/30/2020   Left hand pain 07/30/2020   Weakness 05/01/2020   Grief reaction 11/17/2019   Cervical stenosis of spinal canal 11/17/2019   Hearing loss 11/17/2019  Poor balance 06/12/2019   Screening mammogram, encounter for 05/23/2018   Substernal chest pain 08/16/2017   Atherosclerosis of aorta (HCC) 03/09/2017   Weight loss 02/23/2017   Routine general medical examination at a health care facility 07/05/2015   Estrogen deficiency 07/05/2015   Prediabetes 01/01/2015   Lumbar spinal stenosis 11/13/2014   History of vertigo 10/05/2014   Leukocytosis 07/16/2014   Risk for falls 06/27/2014   Right-sided low back pain with sciatica 07/24/2013   Edema 04/26/2013   Encounter for Medicare annual wellness exam 04/11/2013   Lymphocytosis 01/05/2012   GERD (gastroesophageal reflux disease)    Osteopenia 01/30/2009   Constipation 11/01/2007   Depression with  anxiety 02/14/2007   Hyperlipidemia LDL goal <130 02/04/2007   Gout 02/04/2007   Essential hypertension 02/04/2007   DIVERTICULOSIS, COLON 02/04/2007   Stage 3b chronic kidney disease (CKD) (HCC) 02/04/2007   OSTEOARTHRITIS 02/04/2007   DEGENERATIVE DISC DISEASE 02/04/2007   Past Medical History:  Diagnosis Date   Ankylosis of lumbar spine    Basal cell carcinoma    removed   Cataract See eye surgery   Cervical spine pain    Chest pain    a. 02/2017 MV: EF 54%, small, distal anterior wall/apical infacrt (likely misregistration), no ischemia-->Low risk; b. 02/2017 Echo: EF 55-60%, mild MR, PASP .   Chronic back pain    stenosis   Constipation    takes Colace daily   Depression    takes Zoloft  daily   Diverticulosis of colon    GERD (gastroesophageal reflux disease) Unknown   atypical chest pain   Gout    hx of;was on Allopurinol but taken off 6 wks ago by medical MD   History of diverticulitis of colon    HLD (hyperlipidemia)    takes Pravastatin  daily   HTN (hypertension)    takes Amlodipine  and carvedilol    Lumbar spondylosis with myelopathy    Numbness    OA (osteoarthritis)    right knee   Osteopenia    takes Calcium and Vit D daily   Scoliosis    Vertigo    takes Antivert  daily as needed   Past Surgical History:  Procedure Laterality Date   APPENDECTOMY  at age 29   BACK SURGERY     CATARACT EXTRACTION Right 04/2006   CHOLECYSTECTOMY  1970   COLON SURGERY  1980s   COLONOSCOPY     EYE SURGERY     Cataract removal over several years   KNEE SURGERY Right    Medial meniscus tear   LUMBAR LAMINECTOMY/DECOMPRESSION MICRODISCECTOMY Bilateral 11/13/2014   Procedure: Lumbar one-two Bilateral laminectomy and foraminotomy, Left Lumbar one-two microdiscectomy ;  Surgeon: Darina MALVA Boehringer, MD;  Location: [facility];  Service: Neurosurgery;  Laterality: Bilateral;   NECK SURGERY  [phone]   x 3; fusions   PARTIAL COLECTOMY  09/1999   d/t diverticulitis    SPINE SURGERY     Six  spine surgeries over several years   TUBAL LIGATION     Social History   Tobacco Use   Smoking status: Never   Smokeless tobacco: Never  Vaping Use   Vaping status: Never Used  Substance Use Topics   Alcohol  use: Never    Alcohol /week: 0.0 standard drinks of alcohol    Drug use: Never   Family History  Problem Relation Age of Onset   Cancer Father        throat and bladder   Diabetes Father    Hypertension Father  Heart attack Father    Stroke Mother    Alzheimer's disease Mother    Hypertension Mother    Diabetes Sister    Heart disease Brother    Allergies  Allergen Reactions   Ace Inhibitors     REACTION: increased K+, increased creat.   Bactrim  [Sulfamethoxazole -Trimethoprim ]     rash   Cephalosporins     REACTION: reaction not known   Colesevelam     REACTION: constipation   Hctz [Hydrochlorothiazide ]     Makes her feel bad   Losartan      Increased K   Lovastatin     REACTION: doesn't work   Lyrica  [Pregabalin ]     Felt bad    Raloxifene     REACTION: cramps   Rosuvastatin     REACTION: myalgia   Simvastatin     REACTION: myalgia   Current Outpatient Medications on File Prior to Visit  Medication Sig Dispense Refill   acetaminophen  (TYLENOL ) 650 MG CR tablet Take 650 mg by mouth in the morning, at noon, and at bedtime.     amLODipine  (NORVASC ) 10 MG tablet TAKE 1 TABLET DAILY 90 tablet 0   aspirin  EC 81 MG tablet Take 1 tablet (81 mg total) by mouth daily. 90 tablet 3   Cranberry 450 MG TABS Take 1 capsule by mouth daily.     docusate sodium  (COLACE) 100 MG capsule Take 1-2 capsules (100-200 mg total) by mouth 2 (two) times daily. 100 mg in the morning and 200 mg at night 180 capsule 1   famotidine  (PEPCID ) 20 MG tablet TAKE 1 TABLET TWICE A DAY 180 tablet 1   fish oil-omega-3 fatty acids 1000 MG capsule Take 1 g by mouth at bedtime.     furosemide  (LASIX ) 20 MG tablet Take 20 mg by mouth daily.     hydrALAZINE   (APRESOLINE ) 10 MG tablet Take 1 tablet (10 mg total) by mouth 3 (three) times daily. (Patient taking differently: Take 10 mg by mouth in the morning and at bedtime.) 270 tablet 1   Magnesium  250 MG TABS Take 1 tablet by mouth daily.     pravastatin  (PRAVACHOL ) 20 MG tablet TAKE 1 TABLET DAILY 90 tablet 0   Tetrahydrozoline HCl (EYE DROPS OP) Apply 1-2 drops to eye as needed (for dry eye).     sertraline  (ZOLOFT ) 100 MG tablet TAKE 1.5 TABLETS (150MG  TOTAL) BY MOUTH DAILY 135 tablet 3   No current facility-administered medications on file prior to visit.    Review of Systems  Constitutional:  Positive for fatigue. Negative for activity change, appetite change, fever and unexpected weight change.  HENT:  Negative for congestion, ear pain, rhinorrhea, sinus pressure and sore throat.   Eyes:  Negative for pain, redness and visual disturbance.  Respiratory:  Negative for cough, shortness of breath and wheezing.   Cardiovascular:  Negative for chest pain and palpitations.  Gastrointestinal:  Negative for abdominal pain, blood in stool, constipation and diarrhea.  Endocrine: Negative for polydipsia and polyuria.  Genitourinary:  Negative for dysuria, frequency and urgency.  Musculoskeletal:  Positive for arthralgias and back pain. Negative for myalgias.  Skin:  Negative for pallor and rash.  Allergic/Immunologic: Negative for environmental allergies.  Neurological:  Negative for dizziness, syncope and headaches.       Generalized weakness   Hematological:  Negative for adenopathy. Does not bruise/bleed easily.  Psychiatric/Behavioral:  Negative for decreased concentration and dysphoric mood. The patient is not nervous/anxious.  Objective:   Physical Exam Constitutional:      General: She is not in acute distress.    Appearance: Normal appearance. She is well-developed and normal weight. She is not ill-appearing or diaphoretic.  HENT:     Head: Normocephalic and atraumatic.  Eyes:      Conjunctiva/sclera: Conjunctivae normal.     Pupils: Pupils are equal, round, and reactive to light.  Neck:     Thyroid : No thyromegaly.     Vascular: No carotid bruit or JVD.  Cardiovascular:     Rate and Rhythm: Normal rate and regular rhythm.     Heart sounds: Normal heart sounds.     No gallop.  Pulmonary:     Effort: Pulmonary effort is normal. No respiratory distress.     Breath sounds: Normal breath sounds. No stridor. No wheezing, rhonchi or rales.     Comments: Good air exch Abdominal:     General: There is no distension or abdominal bruit.     Palpations: Abdomen is soft.  Musculoskeletal:     Cervical back: Normal range of motion and neck supple.     Right lower leg: No edema.     Left lower leg: No edema.  Lymphadenopathy:     Cervical: No cervical adenopathy.  Skin:    General: Skin is warm and dry.     Coloration: Skin is not pale.     Findings: No rash.  Neurological:     Mental Status: She is alert.     Coordination: Coordination normal.     Deep Tendon Reflexes: Reflexes are normal and symmetric. Reflexes normal.  Psychiatric:        Mood and Affect: Mood normal.           Assessment & Plan:   Problem List Items Addressed This Visit       Cardiovascular and Mediastinum   Essential hypertension (Chronic)   bp in fair control at this time / stable  BP Readings from Last 1 Encounters:  04/25/24 134/70   No changes needed Most recent labs reviewed  Disc lifstyle change with low sodium diet and exercise  Improved Reviewed nephrology note for hypertensive ckd Continue Hydralazine  10 mg bid Amlodipine  10 mg daily  Lasix  20 mg daily as needed           Respiratory   Pneumonia - Primary   Clinically improved/resolved Still some physical weakness/ has had HH PT  Reassuring exam  Cxr today -clear /reassuring  Instructed pt to call if any symptoms return       Relevant Orders   DG Chest 2 View (Completed)     Genitourinary    Stage 3b chronic kidney disease (CKD) (HCC)   Saw nephrology Dr Dennise  Reviewed progress note today and labs   Latest GFR 40.3 Hypertensive CKD with microalbuminuria K was improved  Continue to monitor   Low K diet if K increases again  Lab Results  Component Value Date   NA 141 03/21/2024   K 4.7 03/21/2024   CO2 36 (H) 03/21/2024   GLUCOSE 137 (H) 03/21/2024   BUN 33 (H) 03/21/2024   CREATININE 1.17 03/21/2024   CALCIUM 9.7 03/21/2024   GFR 40.34 (L) 03/21/2024   EGFR 45 (L) 03/13/2024   GFRNONAA 51 (L) 03/08/2024

## 2024-04-25 NOTE — Assessment & Plan Note (Addendum)
 Clinically improved/resolved Still some physical weakness/ has had HH PT  Reassuring exam  Cxr today -clear /reassuring  Instructed pt to call if any symptoms return

## 2024-04-25 NOTE — Patient Instructions (Signed)
 Chest xray today for re check after pneumonia  Glad you are feeling better  We will reach out with results  Blood pressure is good today as well   Keep eating  Make sure to get enough protein  The following are examples of protein in diet  Meat (lean)  Fish  Eggs  Dairy products  Soy products  Oat milk  Almond milk Nuts and nut butters  Legumes  Dried beans   Take care of yourself !

## 2024-04-25 NOTE — Assessment & Plan Note (Addendum)
 Saw nephrology Dr Dennise  Reviewed progress note today and labs   Latest GFR 40.3 Hypertensive CKD with microalbuminuria K was improved  Continue to monitor   Low K diet if K increases again  Lab Results  Component Value Date   NA 141 03/21/2024   K 4.7 03/21/2024   CO2 36 (H) 03/21/2024   GLUCOSE 137 (H) 03/21/2024   BUN 33 (H) 03/21/2024   CREATININE 1.17 03/21/2024   CALCIUM 9.7 03/21/2024   GFR 40.34 (L) 03/21/2024   EGFR 45 (L) 03/13/2024   GFRNONAA 51 (L) 03/08/2024

## 2024-04-26 ENCOUNTER — Telehealth: Payer: Self-pay | Admitting: *Deleted

## 2024-04-26 NOTE — Telephone Encounter (Signed)
 Copied from CRM [id number]. Topic: Clinical - Lab/Test Results >> Apr 26, 2024  2:43 PM Chiquita SQUIBB wrote: Reason for CRM: Patient is calling in regarding the mychart message with the results. Patient is concerned and would like to speak to a nurse regarding the results.

## 2024-04-27 NOTE — Telephone Encounter (Signed)
 Thanks for letting me know  An xray can show some findings consistent with copd but that is not an actual diagnosis (would need breathing tests and further pulmonary evaluation) Has she been exposed to 2nd hand smoke at any time? Has she ever worked with chemicals/strong scents that affected her breathing   How is breathing now  Any symptoms? Wheezing? Shortness of breath ?

## 2024-04-27 NOTE — Telephone Encounter (Signed)
 Called pt back and she is very concerned about her xray results. Pt said she has never smoked in her life and no one has ever told her she has COPD until she checked this xray result. Pt wants PCP to explain this COPD diagnosis more and if she needs to be on some type of preventative medication or what she should be doing since it is showing she has COPD

## 2024-04-28 NOTE — Telephone Encounter (Signed)
 Pt notified of Dr. Graham comments. Pt said her father smoked when she was a kid but hasn't been around anyone smoking since. Pt also hasn't worked with chemicals or harsh smells. Pt said her breathing is fine and no wheezing or SOB. Pt thanked Dr. Randeen for the f/u info.

## 2024-05-01 NOTE — Telephone Encounter (Signed)
 As long as you are not symptomatic , I would not put you through more pulmonary work up but if any symptoms develop / cough or shortness of breath, let us  know

## 2024-05-03 ENCOUNTER — Other Ambulatory Visit: Payer: Self-pay | Admitting: Family Medicine

## 2024-05-03 NOTE — Telephone Encounter (Signed)
 Called patient reviewed all information and repeated back to me. Will call if any questions.  ? ?

## 2024-06-22 ENCOUNTER — Ambulatory Visit: Payer: Self-pay

## 2024-06-22 ENCOUNTER — Other Ambulatory Visit: Payer: Self-pay | Admitting: Family Medicine

## 2024-06-22 NOTE — Telephone Encounter (Signed)
 Pt notified of Dr. Graham comments and verbalized understanding. Pt will take hydralazine  BID and zoloft  1.5 tab qd

## 2024-06-22 NOTE — Telephone Encounter (Signed)
 Copied from CRM [id number]. Topic: Clinical - Medication Refill >> Jun 22, 2024  9:01 AM Burnard DEL wrote: Medication:sertraline  (ZOLOFT ) 100 MG tablet   Has the patient contacted their pharmacy? No (Agent: If no, request that the patient contact the pharmacy for the refill. If patient does not wish to contact the pharmacy document the reason why and proceed with request.) (Agent: If yes, when and what did the pharmacy advise?)  This is the patient[facility] preferred [phone] [place] [facility] [address] [place] [address] Phone: [phone] Fax: [phone]    Is this the correct pharmacy for this prescription? No If no, delete pharmacy and type the correct one.   Has the prescription been filled recently? No  Is the patient out of the medication? Yes  Has the patient been seen for an appointment in the last year OR does the patient have an upcoming appointment? Yes  Can we respond through MyChart? Yes  Agent: Please be advised that Rx refills may take up to 3 business days. We ask that you follow-up with your pharmacy.  **Patient stated that she is completely out of this medication and just needs it to be sent to a local pharmacy this time,however going forward she will [facility]**

## 2024-06-22 NOTE — Telephone Encounter (Signed)
 Med was [facility] [place], [facility] one of her refills on file

## 2024-06-22 NOTE — Telephone Encounter (Signed)
 FYI Only or Action Required?: Action required by provider: clinical question for provider.  Patient was last seen in primary care on 04/25/2024 by Randeen Laine LABOR, MD.  Called Nurse Triage reporting Medication Problem.  Symptoms began today.  Interventions attempted: Prescription medications: Hydralazine  10 mg.  Symptoms are: stable.  Triage Disposition: No disposition on file.  Patient/caregiver understands and will follow disposition?:    Copied from CRM [id number]. Topic: Clinical - Medication Question >> Jun 22, 2024  1:14 PM Franky GRADE wrote: Reason for CRM: Patient received multiple sertraline  (ZOLOFT ) 100 MG tablet [id number][facility] with different instructions and needs clarification on what she should do. Answer Assessment - Initial Assessment Questions 1. NAME of MEDICINE: What medicine(s) are you calling about?     Hydralazine  10mg    2. QUESTION: What is your question? (e.g., double dose of medicine, side effect)     I was sent too much medication, I have 5 bottles with three different instructions. All 5 bottles say Hydralazine  10 mg, by have different directions.  Advised patient last refill instructions for Hydralazine  10 mg: Take 1 tablet by mouth every morning and at bedtime.   3. PRESCRIBER: Who prescribed the medicine? Reason: if prescribed by specialist, call should be referred to that group.     [employer] 4. SYMPTOMS: Do you have any symptoms? If Yes, ask: What symptoms are you having?  How bad are the symptoms (e.g., mild, moderate, severe)  Protocols used: Medication Question Call-A-AH

## 2024-06-22 NOTE — Telephone Encounter (Signed)
 I think hydralazine  is twice daily  Sertraline  is 150 mg daily   Thanks  Let me know if that sounds right

## 2024-08-07 ENCOUNTER — Telehealth: Payer: Self-pay | Admitting: Family Medicine

## 2024-08-07 ENCOUNTER — Telehealth: Payer: Self-pay | Admitting: Pharmacist

## 2024-08-07 MED ORDER — SERTRALINE HCL 100 MG PO TABS: 150.0000 mg | ORAL_TABLET | Freq: Every day | ORAL | 3 refills | Status: DC

## 2024-08-07 MED ORDER — SERTRALINE HCL 100 MG PO TABS: 150.0000 mg | ORAL_TABLET | Freq: Every day | ORAL | 3 refills | Status: AC

## 2024-08-07 NOTE — Telephone Encounter (Signed)
 I would rather not go down on her dose  To get 150 -can take 3 of the 50 mg once daily  (then she does not have to split a pill)   Ask if she is ok with that   I did not see a 75 mg tab for zoloft  in epic?    Let me know and [facility]   Thanks so much for your help!

## 2024-08-07 NOTE — Addendum Note (Signed)
 Addended by: RANDEEN HARDY A on: 08/07/2024 05:42 PM   Modules accepted: Orders

## 2024-08-07 NOTE — Telephone Encounter (Signed)
 Copied from CRM [id number]. Topic: Clinical - Prescription Issue >> Aug 07, 2024 11:41 AM Donna BRAVO wrote: Reason for CRM: patient calling stating medication sertraline  (ZOLOFT ) 100 MG tablet   insurance is now charging patient for this medication. This medication now a Tier 4 is the generic brand. Patient would like to know if there is something different she can take that does not cost as much.  Patient is on her last bottle and will need a new prescription and cancel the sertraline  (ZOLOFT ) 100 MG tablet  prescription  [facility] [place], [place] - [address] [facility] AT Portal to Reg[facility] [address] [place] [address] Phone: [phone] Fax: [phone] Hours: Not open 24 hours

## 2024-08-07 NOTE — Telephone Encounter (Signed)
 Surprised by this and will run by pharmacy (any insight appreciated, thanks)  Routine back to myself  This is not usually an expensive medicine

## 2024-08-07 NOTE — Progress Notes (Signed)
 Chart Review Reason: Drug information Question - Insurance coverage/sertraline   Summary: Patient states she was told sertraline  moved to Tier 4 and she would like an alternative medication.   [facility]  Next refill due 12/20  Insurance Rx Coverage: YES [employer] Premier Plus (PPO)  Plan ID [id number]  2026 Summary of Benefit states sertraline  remains Tier 1  (liquid sertraline  = Tier 4, I am thinking the insurance guy saw this)  Discussed with patient. She verbalizes understanding. Feels much better.  [facility] [place], She asks that all refills [facility] not [facility] [place]. Refill pended to PCP.    Encouraged her to reach out if any concerns of future refills.   Linda Robinson, PharmD Clinical Pharmacist [facility] [phone]

## 2024-08-07 NOTE — Telephone Encounter (Signed)
 Thanks Continue the 1 1/2 pills daily  Sent to her mail order pharmacy (after I accidentally [facility])-please cancel that one Sorry bout that , thanks

## 2024-08-10 NOTE — Telephone Encounter (Signed)
 See Linda Robinson's phone note pt is aware. Rx cancelled at local pharmacy

## 2024-09-12 ENCOUNTER — Other Ambulatory Visit: Payer: Self-pay | Admitting: Family Medicine

## 2024-09-12 NOTE — Telephone Encounter (Signed)
 Medication showing as provider historical. Please review and advise if necessary.

## 2024-09-12 NOTE — Telephone Encounter (Unsigned)
 Copied from CRM [id number]. Topic: Clinical - Medication Refill >> Sep 12, 2024 12:52 PM Shereese L wrote: Medication: furosemide  (LASIX ) 20 MG tablet  Has the patient contacted their pharmacy? Yes (Agent: If no, request that the patient contact the pharmacy for the refill. If patient does not wish to contact the pharmacy document the reason why and proceed with request.) (Agent: If yes, when and what did the pharmacy advise?)  This is the patient's preferred pharmacy:  [facility] - [place], [place] - One [facility] AT Portal to Registered [facility] Sites [address] [place] [address] Phone: [phone] Fax: [phone]  Is this the correct pharmacy for this prescription? Yes If no, delete pharmacy and type the correct one.   Has the prescription been filled recently? Yes  Is the patient out of the medication? Yes  Has the patient been seen for an appointment in the last year OR does the patient have an upcoming appointment? Yes  Can we respond through MyChart? Yes  Agent: Please be advised that Rx refills may take up to 3 business days. We ask that you follow-up with your pharmacy.

## 2024-09-12 NOTE — Telephone Encounter (Signed)
 Is she currently taking it daily or as needed?  Thanks

## 2024-09-13 MED ORDER — FUROSEMIDE 20 MG PO TABS: 20.0000 mg | ORAL_TABLET | Freq: Every day | ORAL | 1 refills | Status: AC

## 2024-09-13 NOTE — Telephone Encounter (Signed)
 Pt said she was told to take med daily not prn. Pt request PCP sent in 90 day Rx to mail order pharmacy

## 2024-10-08 ENCOUNTER — Other Ambulatory Visit: Payer: Self-pay | Admitting: Family Medicine

## 2024-10-16 ENCOUNTER — Telehealth: Payer: Self-pay | Admitting: *Deleted

## 2024-10-16 ENCOUNTER — Ambulatory Visit: Payer: Self-pay

## 2024-10-16 NOTE — Telephone Encounter (Signed)
 Called pt and she realized she did have the lasix  she said it just looked different then last time but she has been taking it and no other actions needed

## 2024-10-16 NOTE — Telephone Encounter (Signed)
 Copied from CRM #[id number]. Topic: Clinical - Prescription Issue >> Oct 16, 2024  9:08 AM Viola F wrote: Patient called regarding the furosemide  (LASIX ) 20 MG tablet - she [facility]12[facility] and she still hasn't received the medication. She would like the office to look into this for her and give her a call with can update at  [phone]

## 2024-10-16 NOTE — Telephone Encounter (Signed)
 Will see patient then Agree with ER and UC precautions

## 2024-10-16 NOTE — Telephone Encounter (Signed)
 FYI Only or Action Required?: FYI only for provider: appointment scheduled on weds.  Patient was last seen in primary care on 04/25/2024 by Randeen Laine LABOR, MD.  Called Nurse Triage reporting Leg Swelling. Feet swelling  Symptoms began several weeks ago.  Interventions attempted: Prescription medications: Taking laxis.  Symptoms are: unchanged.  Triage Disposition: See PCP When Office is Open (Within 3 Days)  Patient/caregiver understands and will follow disposition?: Yes                   Reason for Triage: Pt sent a CRM this morning regarding furosemide  (LASIX ) 20 MG tablet, please refer to recent Encounters for more info, I informed pt that we have not gotten a response back yet and pt requested to speak to NT since she states that her feet and ankles are currently swollen due to not having med.    Reason for Disposition  [1] MILD swelling of both ankles (i.e., pedal edema) AND [2] new-onset or getting worse  Answer Assessment - Initial Assessment Questions 1. ONSET: When did the swelling start? (e.g., minutes, hours, days)     Last couple of weeks 2. LOCATION: What part of the leg is swollen?  Are both legs swollen or just one leg?     Feet and ankles 3. SEVERITY: How bad is the swelling? (e.g., localized; mild, moderate, severe)     Mild - moderate 4. REDNESS: Is there redness or signs of infection?     no 5. PAIN: Is the swelling painful to touch? If Yes, ask: How painful is it?   (Scale 1-10; mild, moderate or severe)     no 6. FEVER: Do you have a fever? If Yes, ask: What is it, how was it measured, and when did it start?      no 7. CAUSE: What do you think is causing the leg swelling?     unsure 8. MEDICAL HISTORY: Do you have a history of blood clots (e.g., DVT), cancer, heart failure, kidney disease, or liver failure?     no 9. RECURRENT SYMPTOM: Have you had leg swelling before? If Yes, ask: When was the last time? What  happened that time?     Before starting the lasix  10. OTHER SYMPTOMS: Do you have any other symptoms? (e.g., chest pain, difficulty breathing)       no  Protocols used: Leg Swelling and Edema-A-AH

## 2024-10-18 ENCOUNTER — Ambulatory Visit: Admitting: Family Medicine

## 2024-10-20 ENCOUNTER — Ambulatory Visit: Admitting: Family Medicine

## 2024-10-25 ENCOUNTER — Other Ambulatory Visit: Payer: Self-pay | Admitting: Family Medicine

## 2025-01-30 ENCOUNTER — Ambulatory Visit

## 2025-01-31 ENCOUNTER — Ambulatory Visit
